# Patient Record
Sex: Female | Born: 1935
Health system: Southern US, Community
[De-identification: ages and names within clinical notes are randomized; demographics above are authoritative.]

## PROBLEM LIST (undated history)

## (undated) DIAGNOSIS — D649 Anemia, unspecified: Secondary | ICD-10-CM

## (undated) DIAGNOSIS — I10 Essential (primary) hypertension: Secondary | ICD-10-CM

## (undated) DIAGNOSIS — R011 Cardiac murmur, unspecified: Secondary | ICD-10-CM

## (undated) DIAGNOSIS — E785 Hyperlipidemia, unspecified: Secondary | ICD-10-CM

## (undated) DIAGNOSIS — K219 Gastro-esophageal reflux disease without esophagitis: Secondary | ICD-10-CM

## (undated) DIAGNOSIS — I890 Lymphedema, not elsewhere classified: Secondary | ICD-10-CM

## (undated) DIAGNOSIS — I499 Cardiac arrhythmia, unspecified: Secondary | ICD-10-CM

## (undated) DIAGNOSIS — I872 Venous insufficiency (chronic) (peripheral): Secondary | ICD-10-CM

## (undated) DIAGNOSIS — N189 Chronic kidney disease, unspecified: Secondary | ICD-10-CM

## (undated) DIAGNOSIS — L0390 Cellulitis, unspecified: Secondary | ICD-10-CM

## (undated) DIAGNOSIS — M1990 Unspecified osteoarthritis, unspecified site: Secondary | ICD-10-CM

## (undated) HISTORY — DX: Lymphedema, not elsewhere classified: I89.0

## (undated) HISTORY — DX: Hyperlipidemia, unspecified: E78.5

## (undated) HISTORY — PX: OTHER SURGICAL HISTORY: SHX169

## (undated) HISTORY — DX: Venous insufficiency (chronic) (peripheral): I87.2

## (undated) HISTORY — PX: JOINT REPLACEMENT: SHX530

## (undated) HISTORY — DX: Cellulitis, unspecified: L03.90

## (undated) HISTORY — PX: TONSILLECTOMY: SUR1361

---

## 2009-09-18 ENCOUNTER — Emergency Department (HOSPITAL_BASED_OUTPATIENT_CLINIC_OR_DEPARTMENT_OTHER): Admission: EM | Admit: 2009-09-18 | Discharge: 2009-09-18 | Payer: Self-pay | Admitting: Emergency Medicine

## 2009-09-19 ENCOUNTER — Ambulatory Visit (HOSPITAL_COMMUNITY)
Admission: RE | Admit: 2009-09-19 | Discharge: 2009-09-19 | Payer: Self-pay | Source: Home / Self Care | Admitting: Emergency Medicine

## 2010-04-05 ENCOUNTER — Emergency Department (HOSPITAL_BASED_OUTPATIENT_CLINIC_OR_DEPARTMENT_OTHER): Admission: EM | Admit: 2010-04-05 | Discharge: 2010-04-06 | Payer: Self-pay | Admitting: Emergency Medicine

## 2010-04-14 ENCOUNTER — Ambulatory Visit: Payer: Self-pay | Admitting: Vascular Surgery

## 2010-04-22 ENCOUNTER — Encounter: Payer: Self-pay | Admitting: Internal Medicine

## 2010-04-23 ENCOUNTER — Encounter: Admission: RE | Admit: 2010-04-23 | Discharge: 2010-04-23 | Payer: Self-pay | Admitting: Allergy and Immunology

## 2010-05-04 ENCOUNTER — Inpatient Hospital Stay (HOSPITAL_COMMUNITY): Admission: EM | Admit: 2010-05-04 | Discharge: 2010-05-10 | Payer: Self-pay | Admitting: Emergency Medicine

## 2010-05-05 ENCOUNTER — Encounter (INDEPENDENT_AMBULATORY_CARE_PROVIDER_SITE_OTHER): Payer: Self-pay | Admitting: Internal Medicine

## 2010-05-07 ENCOUNTER — Ambulatory Visit: Payer: Self-pay | Admitting: Vascular Surgery

## 2010-05-07 ENCOUNTER — Encounter (HOSPITAL_BASED_OUTPATIENT_CLINIC_OR_DEPARTMENT_OTHER): Payer: Self-pay | Admitting: Internal Medicine

## 2010-05-23 ENCOUNTER — Encounter: Payer: Self-pay | Admitting: Internal Medicine

## 2010-06-10 ENCOUNTER — Ambulatory Visit: Payer: Self-pay | Admitting: Physician Assistant

## 2010-06-10 DIAGNOSIS — Z860100 Personal history of colon polyps, unspecified: Secondary | ICD-10-CM | POA: Insufficient documentation

## 2010-06-10 DIAGNOSIS — J45909 Unspecified asthma, uncomplicated: Secondary | ICD-10-CM | POA: Insufficient documentation

## 2010-06-10 DIAGNOSIS — D509 Iron deficiency anemia, unspecified: Secondary | ICD-10-CM | POA: Insufficient documentation

## 2010-06-10 DIAGNOSIS — D649 Anemia, unspecified: Secondary | ICD-10-CM | POA: Insufficient documentation

## 2010-06-12 LAB — CONVERTED CEMR LAB
Basophils Absolute: 0.2 10*3/uL — ABNORMAL HIGH (ref 0.0–0.1)
Eosinophils Absolute: 1.8 10*3/uL — ABNORMAL HIGH (ref 0.0–0.7)
Hemoglobin: 10.4 g/dL — ABNORMAL LOW (ref 12.0–15.0)
Lymphocytes Relative: 27.6 % (ref 12.0–46.0)
MCHC: 31.8 g/dL (ref 30.0–36.0)
Neutro Abs: 4.1 10*3/uL (ref 1.4–7.7)
RDW: 17.5 % — ABNORMAL HIGH (ref 11.5–14.6)

## 2010-06-13 ENCOUNTER — Encounter
Admission: RE | Admit: 2010-06-13 | Discharge: 2010-06-13 | Payer: Self-pay | Source: Home / Self Care | Attending: Allergy and Immunology | Admitting: Allergy and Immunology

## 2010-06-13 ENCOUNTER — Encounter: Payer: Self-pay | Admitting: Internal Medicine

## 2010-06-13 ENCOUNTER — Encounter (INDEPENDENT_AMBULATORY_CARE_PROVIDER_SITE_OTHER): Payer: Self-pay | Admitting: *Deleted

## 2010-06-26 ENCOUNTER — Telehealth (INDEPENDENT_AMBULATORY_CARE_PROVIDER_SITE_OTHER): Payer: Self-pay | Admitting: *Deleted

## 2010-06-26 ENCOUNTER — Ambulatory Visit: Payer: Self-pay | Admitting: Internal Medicine

## 2010-06-26 DIAGNOSIS — R05 Cough: Secondary | ICD-10-CM

## 2010-07-11 ENCOUNTER — Ambulatory Visit: Admit: 2010-07-11 | Payer: Self-pay | Admitting: Internal Medicine

## 2010-07-24 ENCOUNTER — Encounter (INDEPENDENT_AMBULATORY_CARE_PROVIDER_SITE_OTHER): Payer: Self-pay | Admitting: *Deleted

## 2010-07-24 ENCOUNTER — Inpatient Hospital Stay (HOSPITAL_COMMUNITY)
Admission: EM | Admit: 2010-07-24 | Discharge: 2010-07-29 | Payer: Self-pay | Source: Home / Self Care | Attending: Internal Medicine | Admitting: Internal Medicine

## 2010-07-25 ENCOUNTER — Encounter (INDEPENDENT_AMBULATORY_CARE_PROVIDER_SITE_OTHER): Payer: Self-pay | Admitting: *Deleted

## 2010-07-26 ENCOUNTER — Encounter (INDEPENDENT_AMBULATORY_CARE_PROVIDER_SITE_OTHER): Payer: Self-pay | Admitting: *Deleted

## 2010-07-28 LAB — BASIC METABOLIC PANEL
BUN: 12 mg/dL (ref 6–23)
Chloride: 99 mEq/L (ref 96–112)
Chloride: 97 mEq/L (ref 96–112)
Creatinine, Ser: 0.98 mg/dL (ref 0.4–1.2)
GFR calc Af Amer: >60 mL/min (ref >60)
GFR calc non Af Amer: 53 mL/min — ABNORMAL LOW (ref >60)
Potassium: 4.4 mEq/L (ref 3.5–5.1)
Potassium: 4.2 mEq/L (ref 3.5–5.1)
Sodium: 130 mEq/L — ABNORMAL LOW (ref 135–145)

## 2010-07-28 LAB — CBC
MCH: 19.5 pg — ABNORMAL LOW (ref 26.0–34.0)
MCV: 62.1 fL — ABNORMAL LOW (ref 78.0–100.0)
Platelets: 188 10*3/uL (ref 150–400)
Platelets: 174 10*3/uL (ref 150–400)
RBC: 4.77 MIL/uL (ref 3.87–5.11)
RBC: 4.48 MIL/uL (ref 3.87–5.11)
WBC: 13.1 10*3/uL — ABNORMAL HIGH (ref 4.0–10.5)
WBC: 9.1 10*3/uL (ref 4.0–10.5)

## 2010-07-28 LAB — DIFFERENTIAL
Basophils Absolute: 0 10*3/uL (ref 0.0–0.1)
Eosinophils Absolute: 0.3 10*3/uL (ref 0.0–0.7)
Lymphocytes Relative: 9 % — ABNORMAL LOW (ref 12–46)
Neutrophils Relative %: 77 % (ref 43–77)

## 2010-07-28 LAB — IRON AND TIBC
Iron: 15 ug/dL — ABNORMAL LOW (ref 42–135)
Saturation Ratios: 6 % — ABNORMAL LOW (ref 20–55)
TIBC: 234 ug/dL — ABNORMAL LOW (ref 250–470)
UIBC: 219 ug/dL

## 2010-07-29 LAB — BASIC METABOLIC PANEL
Calcium: 8.8 mg/dL (ref 8.4–10.5)
Creatinine, Ser: 1.25 mg/dL — ABNORMAL HIGH (ref 0.4–1.2)
GFR calc Af Amer: 49 mL/min — ABNORMAL LOW (ref >60)
GFR calc non Af Amer: 40 mL/min — ABNORMAL LOW (ref >60)
Sodium: 131 mEq/L — ABNORMAL LOW (ref 135–145)

## 2010-07-29 LAB — CBC
Hemoglobin: 8.7 g/dL — ABNORMAL LOW (ref 12.0–15.0)
RBC: 4.44 MIL/uL (ref 3.87–5.11)
WBC: 7.1 10*3/uL (ref 4.0–10.5)

## 2010-07-30 ENCOUNTER — Ambulatory Visit: Admit: 2010-07-30 | Payer: Self-pay | Admitting: Internal Medicine

## 2010-07-30 LAB — CULTURE, BLOOD (ROUTINE X 2)
Culture  Setup Time: 201201192317
Culture  Setup Time: 201201192318
Culture: NO GROWTH

## 2010-08-05 NOTE — Assessment & Plan Note (Signed)
Summary: IRON DEF ANEMIA...AS.   History of Present Illness Visit Type: Initial Consult Primary GI MD: Erskine Emery MD [facility] Requesting Provider: Leanna Battles, MD Chief Complaint: Iron def anemia with black stools. Pt is taking a iron supplement. Pt states she did have a colonoscopy 4-5 years ago. Pt states she wants to discuss having another colonoscopy with her age and health issues. History of Present Illness:   Lori 77 YO [name], NEW TO GI TODAY, REFERRED BY DR. PATERSON FOR EVALUATION OF AN FE DEFICIENCY ANEMIA. SHE WAS FOUND TO BE ANEMIC WHILE HOSPITALIZED IN NOV.2011 WITH CELLULITIS IN HER LEG. SHE HAD HGB ELECTROPHORESIS WHICH SHOWED A 95%HGBA,AND 5%HGBA 2 .   HGB WAS 8.6,MCV62, FE 13,tibc 265, fe sat 5 %. SHE WAS STARTED ON NU IRON two times a day.HEMOSURE WAS NEGATIVE. SHE SAYS SHE HAS FAMILY HX OF "MEDITERRANEAN  ANEMIA" IN HER MOM, AND WAS TOLD SHE PROBABLY HAD IT IN THE PAST. SHE HAS HAD PRIOR COLONOSCOPIES PER DR. RHOTON IN [place], NO PRIROP EGD. WE HAVE OBTAINED SOME OF THOSE RECORDS [facility] HYPERPLASTIC POLYPS  IN 2002. WE HAVAN OFFICE NOTE FROM 2008 DISCUSSING FOLLOWUP BUT I DO NOT KNOW IF SHE HAD A COLONOSCOPY THE OR NOT.  SHE DENIES ANY ABDOMINAL PAIN, NO CHANGES IN BOWEL HABITS. STOOLS ARE DARK SINCE ON IRON, BUT NOT BEFORE. NO C/O HEART5BURN ,INDIGESTION ETC, NO DYSPHAGIA.    GI Review of Systems      Denies abdominal pain, acid reflux, belching, bloating, chest pain, dysphagia with liquids, dysphagia with solids, heartburn, loss of appetite, nausea, vomiting, vomiting blood, weight loss, and  weight gain.      Reports black tarry stools.     Denies anal fissure, change in bowel habit, constipation, diarrhea, diverticulosis, fecal incontinence, heme positive stool, hemorrhoids, irritable bowel syndrome, jaundice, light color stool, liver problems, rectal bleeding, and  rectal pain. Preventive Screening-Counseling & Management  Alcohol-Tobacco     Smoking  Status: quit      Drug Use:  no.      Current Medications (verified): 1)  Calcium 600 1500 Mg Tabs (Calcium Carbonate) .... One Tablet By Mouth Once Daily 2)  Coq10 100 Mg Caps (Coenzyme Q10) .... One Capsule By Mouth Once Daily 3)  Selenium 200 Mcg Tabs (Selenium) .... One Tablet By Mouth Once Daily 4)  Zinc 50 Mg Tabs (Zinc) .... One Tablet By Mouth Once Daily 5)  Magnesium 300 Mg Caps (Magnesium) .... One Capsule By Mouth Once Daily 6)  Vitamin E 1000 Unit Caps (Vitamin E) .... One Capsule By Mouth Once Daily 7)  Msm 1000 Mg Caps (Methylsulfonylmethane) .... One Capsule By Mouth Once Daily 8)  Folic Acid Q000111Q Mcg Tabs (Folic Acid) .... One Tablet By Mouth Once Daily 9)  Fish Oil 1200 Mg Caps (Omega-3 Fatty Acids) .... One Capsule By Mouth Once Daily 10)  Icaps  Caps (Multiple Vitamins-Minerals) .... One Capsule By Mouth Two Times A Day 11)  Nu-Iron 150 Mg Caps (Polysaccharide Iron Complex) .... One Capsule By Mouth Two Times A Day 12)  Align  Caps (Probiotic Product) .... One Capsule By Mouth Once Daily 13)  Symbicort 160-4.5 Mcg/act Aero (Budesonide-Formoterol Fumarate) .... 2 Puffs Two Times A Day 14)  Omeprazole 40 Mg Cpdr (Omeprazole) .... One Capsule By Mouth Two Times A Day 15)  Nasonex 50 Mcg/act Susp (Mometasone Furoate) .... One Spray Two Times A Day 16)  Zyrtec Allergy 10 Mg Tabs (Cetirizine Hcl) .... One Tablet By Mouth Once Daily  As Needed 17)  Albuterol Sulfate (2.5 Mg/44ml) 0.083% Nebu (Albuterol Sulfate) .... 4-6 Hour As Needed  Allergies (verified): No Known Drug Allergies  Past History:  Past Medical History: Iron defiency anemia Asthma Hx colon polyps Allergies  Past Surgical History: Unremarkable COLONOSCOPY 2002 DR. RHOTON  HYPERPLASTIC POLYPS  Family History: Unknown  Social History: Retired Widowed Patient is a former smoker.  Alcohol Use - no Daily Caffeine Use Illicit Drug Use - no Smoking Status:  quit Drug Use:  no  Review of Systems        The patient complains of allergy/sinus, anemia, arthritis/joint pain, heart murmur, itching, swelling of feet/legs, and urine leakage.  The patient denies anxiety-new, back pain, blood in urine, breast changes/lumps, change in vision, confusion, cough, coughing up blood, depression-new, fainting, fatigue, fever, headaches-new, hearing problems, heart rhythm changes, menstrual pain, muscle pains/cramps, night sweats, nosebleeds, pregnancy symptoms, shortness of breath, skin rash, sleeping problems, sore throat, swollen lymph glands, thirst - excessive , urination - excessive , urination changes/pain, vision changes, and voice change.         SEE HPI  Vital Signs:  Patient profile:   77 year old [name] Height:      65 inches Weight:      190.13 pounds BMI:     31.75 Pulse rate:   78 / minute Pulse rhythm:   regular BP sitting:   148 / 64  (right arm) Cuff size:   regular  Vitals Entered By: Marlon Pel CMA Lori [name]) (June 10, 2010 11:09 AM)  Physical Exam  General:  Well developed, well nourished, no acute distress.,APPEARS YOUNGER THAN HER STATED AGE Head:  Normocephalic and atraumatic. Eyes:  PERRLA, no icterus. Neck:  Supple; no masses or thyromegaly. Lungs:  Clear throughout to auscultation. Heart:  Regular rate and rhythm; SOFT, SYSTOLIC MURMUR Abdomen:  SOFT, NONTENDER, NO MASS OR HSM BS+ Rectal:  SCANT STOOL IN VAULT  HEME NEGATIVE  Extremities:  No clubbing, cyanosis, edema or deformities noted. Neurologic:  Alert and  oriented x4;  grossly normal neurologically. Psych:  Alert and cooperative. Normal mood and affect.   Impression & Recommendations:  Problem # 1:  ANEMIA, IRON DEFICIENCY (ICD-280.9) Assessment New 77 YO [name] IN GENERALLY GOOD HEALTH  WITH RECENT FINDING OF A COMBINED ANEMIA-WITH THALASSEMIA, AND IRON DEFICIENCY. HEME NEGATIVE ON EXAM TODAY AND BY HEMOSURE TESTING. NOT CLEAR THAT SHE HAS A CHRONIC GI BLOOD LOSS.  DISCUSSED OPTIONS OF COLONOSCOPY  AND UPPER ENDOSCOPY,VS. CONTINUING IRON REPLACEMENT FOR 3 MONTHS THEN REPEATING IRON STUDIES, BEFORE DECIDING TO PURSUE PROCEDURES. SHE FEVORE THIS APPROACH WHICH  IS REASONABLE. SHE ALSO SAYS SHE WOULDN'T HAVE SURGERY EVEN IF WE FOUND SOMETHING.  WILL CALL DR. Dinah Beers OFFICE AND BE SURE 2002 WAS HER MOST RECENT PROCEDURE, AND PT. THOUGHT IT WAS 4-5 YEARS AGO. CHECK CBC TODAY FOLLOW UP DR. PERRY IN 6 WEEKS, REPEAT IRON STUDIES. Orders: TLB-CBC Platelet - w/Differential ([id number]) ADDENDUM-CONTACTED DR. Dinah Beers OFFICE,CHART REVIEWED AND SHE HAS NOT HAD ANY PROCEDURE THERE SINCE 2002.  Problem # 2:  PERSONAL HX COLONIC POLYPS (ICD-V12.72) Assessment: Comment Only HYPERPLASTIC 2002  Problem # 3:  ASTHMA UNSPECIFIED (ICD-493.90) Assessment: Comment Only  Patient Instructions: 1)  Please go to lab, basement level. 2)  We will call you with the results. 3)  We made you a follow up appointment with Dr. Henrene Pastor for 07-30-2009. 4)  Appointment card given. 5)  We cancelled the apptointment with Dr. Henrene Pastor for 07-11-2009.  6)  Copy sent to :  Dr. Leanna Battles 7)  The medication list was reviewed and reconciled.  All changed / newly prescribed medications were explained.  A complete medication list was provided to the patient / caregiver.

## 2010-08-05 NOTE — Letter (Signed)
Summary: New Patient letter  [facility]  [address] [place], [place] [address]   Phone: [phone]  Fax: [phone]       05/23/2010 MRN: [id number]  [facility] [address] [place], [place]  [address]  Dear Lori [name],  Welcome to the Gastroenterology Division at [facility].    You are scheduled to see Dr.  Henrene Pastor on 07-11-10 at  11am on the 3rd floor at [facility], [place] [employer].  We ask that you try to arrive at our office 15 minutes prior to your appointment time to allow for check-in.  We would like you to complete the enclosed self-administered evaluation form prior to your visit and bring it with you on the day of your appointment.  We will review it with you.  Also, please bring a complete list of all your medications or, if you prefer, bring the medication bottles and we will list them.  Please bring your insurance card so that we may make a copy of it.  If your insurance requires a referral to see a specialist, please bring your referral form from your primary care physician.  Co-payments are due at the time of your visit and may be paid by cash, check or credit card.     Your office visit will consist of a consult with your physician (includes a physical exam), any laboratory testing he/she may order, scheduling of any necessary diagnostic testing (e.g. x-ray, ultrasound, CT-scan), and scheduling of a procedure (e.g. Endoscopy, Colonoscopy) if required.  Please allow enough time on your schedule to allow for any/all of these possibilities.    If you cannot keep your appointment, please call [phone] to cancel or reschedule prior to your appointment date.  This allows [place] the opportunity to schedule an appointment for another patient in need of care.  If you do not cancel or reschedule by 5 p.m. the business day prior to your appointment date, you will be charged a $50.00 late cancellation/no-show fee.    Thank you for choosing [facility] for your medical needs.  We appreciate the opportunity to care for you.  Please visit [place] at our website  to learn more about our practice.                     Sincerely,                                                             The Gastroenterology Division

## 2010-08-07 NOTE — Assessment & Plan Note (Signed)
Summary: Pulmonary/ new pt eval for uacs   Visit Type:  Initial Consult Copy to:  Dr. Neldon Mc Primary Provider/Referring Provider:  Dr. Bevelyn Buckles  CC:  Cough.  History of Present Illness: 77 yowf minimal smoker quit age 77 but lifelong pnds allergic dust and mold only per Dr Neldon Mc who referred her to Pulmonary clinic for cough responsive to even low doses of prednisone   June 26, 2010  1st pulmonary office eval of worsening pnds since 1995 esp since 2000 when husband diesd,  and last bad cough was October 2011  better with prednisone but still bothererd by PNDS seems worse after goes to bathroom in am, sleeps in recliner x 2 years due to concerns of excess mucus but does not disturb sleep. mucus is white assoc with nasal congestion no better on allegra or nasal steroids   Pt denies any significant sore throat, dysphagia, itching, sneezing,  nasal congestion or excess secretions,  fever, chills, sweats, unintended wt loss, pleuritic or exertional cp, hempoptysis, change in activity tolerance  orthopnea pnd or leg swelling Pt also denies any obvious fluctuation in symptoms with weather or environmental change or other alleviating or aggravating factors.       Current Medications (verified): 1)  Calcium 600 1500 Mg Tabs (Calcium Carbonate) .... One Tablet By Mouth Once Daily 2)  Coq10 100 Mg Caps (Coenzyme Q10) .... One Capsule By Mouth Once Daily 3)  Selenium 200 Mcg Tabs (Selenium) .... One Tablet By Mouth Once Daily 4)  Zinc 50 Mg Tabs (Zinc) .... One Tablet By Mouth Once Daily 5)  Magnesium 300 Mg Caps (Magnesium) .... One Capsule By Mouth Once Daily 6)  Vitamin E 1000 Unit Caps (Vitamin E) .... One Capsule By Mouth Once Daily 7)  Msm 1000 Mg Caps (Methylsulfonylmethane) .... One Capsule By Mouth Once Daily 8)  Folic Acid Q000111Q Mcg Tabs (Folic Acid) .... One Tablet By Mouth Once Daily 9)  Fish Oil 1200 Mg Caps (Omega-3 Fatty Acids) .... One Capsule By Mouth Once Daily 10)  Icaps   Caps (Multiple Vitamins-Minerals) .... One Capsule By Mouth Two Times A Day 11)  Nu-Iron 150 Mg Caps (Polysaccharide Iron Complex) .... One Capsule By Mouth Two Times A Day 12)  Align  Caps (Probiotic Product) .... One Capsule By Mouth Once Daily 13)  Symbicort 160-4.5 Mcg/act Aero (Budesonide-Formoterol Fumarate) .... 2 Puffs Two Times A Day 14)  Omeprazole 40 Mg Cpdr (Omeprazole) .... One Capsule By Mouth Two Times A Day-Ran Out 15)  Nasonex 50 Mcg/act Susp (Mometasone Furoate) .... One Spray Two Times A Day 16)  Allegra Allergy 60 Mg Tabs (Fexofenadine Hcl) .Marland Kitchen.. 1 Once Daily As Needed 17)  Albuterol Sulfate (2.5 Mg/50ml) 0.083% Nebu (Albuterol Sulfate) .... 4-6 Hour As Needed 18)  Selenium 200 Mcg Tabs (Selenium) .Marland Kitchen.. 1 Once Daily  Allergies (verified): No Known Drug Allergies  Past History:  Past Medical History: Iron defiency anemia Asthma Hx colon polyps Allergies Chronic cough/ uacs      - singulair failed       - Trial of 1st gen H1 June 26, 2010   Past Surgical History: Unremarkable COLONOSCOPY 2002 DR. RHOTON  HYPERPLASTIC POLYPS Left knee replacement 2005 Rt hip replacement 2006  Family History: Lung CA- Sister (was a smoker) Mother (never was a smoker)  Social History: Retired Network engineer Widowed Patient is a former smoker. Does not recall when quit or how long she smoked- "maybe 10 yrs" Alcohol Use - no Daily Caffeine Use Illicit  Drug Use - no  Review of Systems       The patient complains of productive cough, nasal congestion/difficulty breathing through nose, and itching.  The patient denies shortness of breath with activity, shortness of breath at rest, non-productive cough, coughing up blood, chest pain, irregular heartbeats, acid heartburn, indigestion, loss of appetite, weight change, abdominal pain, difficulty swallowing, sore throat, tooth/dental problems, headaches, sneezing, ear ache, anxiety, depression, hand/feet swelling, joint stiffness or pain,  rash, change in color of mucus, and fever.    Vital Signs:  Patient profile:   77 year old female Height:      65 inches Weight:      194 pounds O2 Sat:      96 % on Room air Temp:     98.4 degrees F oral Pulse rate:   89 / minute BP sitting:   158 / 90  (left arm)  Vitals Entered By: Tilden Dome (June 26, 2010 10:08 AM)  O2 Flow:  Room air  Physical Exam  Additional Exam:  wt 190 > 194 June 26, 2010  pleasant amb wf with mild nasal tone visi HEENT: nl dentition, turbinates, and orophanx. Nl external ear canals without cough reflex NECK :  without JVD/Nodes/TM/ nl carotid upstrokes bilaterally LUNGS: no acc muscle use, clear to A and P bilaterally without cough on insp or exp maneuvers CV:  RRR  no s3 or murmur or increase in P2, no edema  ABD:  soft and nontender with nl excursion in the supine position. No bruits or organomegaly, bowel sounds nl MS:  warm without deformities, calf tenderness, cyanosis or clubbing SKIN: warm and dry without lesions   NEURO:  alert, approp, no deficits     Impression & Recommendations:  Problem # 1:  COUGH (ICD-786.2)  Classic  Classic Upper airway cough syndrome, so named because it's frequently impossible to sort out how much is  CR/sinusitis with freq throat clearing (which can be related to primary GERD)   vs  causing  secondary extra esophageal GERD from wide swings in gastric pressure that occur with throat clearing, promoting self use of mint and menthol lozenges that reduce the lower esophageal sphincter tone and exacerbate the problem further.  These are the same pts who not infrequently have failed to tolerate ace inhibitors,  dry powder inhalers or biphosphonates or report having reflux symptoms that don't respond to standard doses of PPI  Add 1st gen H1 as per guidelines and intensify rx of diet reviewed.  discussed: The standardized cough guidelines recently published in Chest are a 14 step process, not a single office  visit,  and are intended  to address this problem logically,  with an alogrithm dependent on response to each progressive step  to determine a specific diagnosis with  minimal addtional testing needed. Therefore if compliance is an issue this empiric standardized approach simply won't work.   Orders: New Patient Level V [id number])  Medications Added to Medication List This Visit: 1)  Omeprazole 40 Mg Cpdr (Omeprazole) .... One capsule by mouth two times a day-ran out 2)  Omeprazole 40 Mg Cpdr (Omeprazole) .... Take one 30-60 min before first and last meals of the day 3)  Allegra Allergy 60 Mg Tabs (Fexofenadine hcl) .Marland Kitchen.. 1 once daily as needed 4)  Selenium 200 Mcg Tabs (Selenium) .Marland Kitchen.. 1 once daily 5)  Pepcid 20 Mg Tabs (Famotidine) .... Take one by mouth at bedtime 6)  Chlor-trimeton 4 Mg Tabs (Chlorpheniramine maleate) .... One at  bedtime and every 6 hours during the day if notice drainage  Patient Instructions: 1)  Stop all oil based vitamins 2)  stop allegra 3)  Start chlortrimeton 4mg  one at bedtime automatica and then every 6 hours as needed during the day 4)  Add Pepcid 20 mg one at bedtime 5)  GERD (REFLUX)  is a common cause of respiratory symptoms. It commonly presents without heartburn and can be treated with medication, but also with lifestyle changes including avoidance of late meals, excessive alcohol, smoking cessation, and avoid fatty foods, chocolate, peppermint, colas, red wine, and acidic juices such as orange juice. NO MINT OR MENTHOL PRODUCTS SO NO COUGH DROPS  6)  USE SUGARLESS CANDY INSTEAD (jolley ranchers)  7)  NO OIL BASED VITAMINS  8)  Please schedule a follow-up appointment in 6 weeks, sooner if needed  Prescriptions: OMEPRAZOLE 40 MG CPDR (OMEPRAZOLE) Take one 30-60 min before first and last meals of the day  #60 x 11   Entered and Authorized by:   Tanda Rockers MD   Signed by:   Tanda Rockers MD on 06/26/2010   Method used:   Electronically to        [id number] (retail)       [address]       [place], [place]  [address]       Ph: [phone] or [phone]       Fax: [phone]   RxID:   [id number]   Appended Document: Pulmonary/ new pt eval for uacs copy to Drs Sharlett Iles and Neldon Mc

## 2010-08-07 NOTE — Discharge Summary (Signed)
Summary: Hospital Discharge Summary  Lori [name] MRN: [id number] Acct#: [id number] PHYSICIAN DOCUMENTATION SHEET Thu Jan 19 23:47:51 EST 2012 Tillie Rung. [facility] [address] [place], [place] [address] PHONE: [phone] MRN: [id number] Account #: [id number] Name: Lori [name], Lori [name] Sex: F Age: 77 DOB: 01-24-1919 Complaint: Rash Primary Diagnosis: Cellulitis of left lower leg Arrival Time: 07/24/2010 13:51 Discharge Time: 07/24/2010 20:22 All Providers: Dr. Blanchie [name] - MD; Mr Lori [name] - PA PROVIDER: Dr. Blanchie [name] - MD HPI: The patient is a 77 year old female who presents with a chief complaint of rash. The history was provided by the patient. The patient was seen at 03:27 PM. The rash started yesterday. The onset was gradual. The Pattern is persistent. The Course is worsening. The complaint is described as a(n) pain and rash. The rash is located in the right lower extremity. The rash is described as erythematous and macular. The symptoms are described as moderate. The condition is aggravated by palpation and walking. The condition is relieved by nothing. The symptoms have been associated with chills and fever, fever, itching and nausea, while the symptoms have not been associated with diarrhea, difficulty breathing, dysuria or headache. The patient has a significant history of similar symptoms previously, while there is no significant history of serious medical conditions. 15:28 07/24/2010 by Lori [name] - MD, Dr. Leonard [name]: Statement: all systems negative except as marked or noted in the HPI 15:31 07/24/2010 by Lori [name] - MD, Dr. Carmon [name]: Documentation: physician reviewed/amended Historian: patient Patient's Current Physicians Patient's Current Physicians (please list PCP first) Lori [name] - IM, Lori [name] NOTE - DR Lori [name] Past medical history: acid reflux, allergies, hypercholesterolemia Surgical History: knee surgery,  tonsillectomy Social History: non-smoker, non-drinker, no drug abuse Allergies Drug Reaction Allergy Note NKDA 15:31 07/24/2010 by Lori [name] - MD, Dr. Fransico [name] Lori [name], Lori [name] - MRN: [id number] Acct#: [id number] Home Medications: Documentation: physician reviewed/amended Medications Medication [Medication] Dosage Frequency Last Dose Unable I-Caps Areds Oral Nu-Iron Oral omeprazole Oral 15:31 07/24/2010 by Lori [name] - MD, Dr. Physical examination: Vital signs and O2 SAT: reviewed, normal except, febrile, tachycardic Constitutional: well developed, well nourished, well hydrated, in no acute distress Head and Face: normocephalic Eyes: normal appearance, EOMI, PERRL ENMT: mouth and pharynx normal Neck: supple Cardiovascular: no murmur, rub, or gallop, tachycardia Respiratory: normal, breath sounds clear & equal bilaterally, no rales, rhonchi, wheezes, or rub Chest: nontender Abdomen: soft, nontender, no guarding, no rebound tenderness Extremities: normal except, see skin exam, neurovascularly intact, pulses normal Neuro: AA&Ox3, gait normal, motor intact in all extremities, sensation normal Skin: color normal, no petechiae NOTE - dark erythema of the left distal dorsal foot and mid lower leg tha is warm and macular. no pustules, fluctuance or induration. 15:32 07/24/2010 by Lori [name] - MD, Dr. ED Course: Comments: pt with recurrent cellulitis and chronic lymphedema who developed sx of cellulitis again last night with fever and systemic symptoms today. Pt is well appearing but febrile here and areas of cellulitis on the left lower leg. CBC, BMP, UA and blood cx pending. Will give benadryl, tylenol and start clinda. 15:34 07/24/2010 by Lori [name] - MD, Dr. ED Course: Comments: sent to CDU to await labs. 15:35 07/24/2010 by Lori [name] - MD, Dr. Attending: Supervision of: Midlevel: See CDU documentation 15:34 07/24/2010 by Lori [name] - MD,  Dr. Hyman [name] electronically signed by ER Physician 23:47 07/24/2010 by Lori [name] - MD, Dr. Kingsley [name] Lori [name], Lori [name] - MRN: [id number] Acct#: [id number] PROVIDER: Mr Lori [name] -  PA ED Course: Comments: Pt discussed with Lori [name]. Pt with left lower extremity cellulitis and fever. Pt has had hx of same in past. Labs pending. Will plan for admission. 16:52 07/24/2010 by Lori [name] - PA, Mr ED Course: Comments: Spoke with Dr. Sharlett [name]. He will come see pt and admit. He has given holding orders to nurse and bed request. 17:03 07/24/2010 by Lori [name] - PA, Mr ED Course: Comments: Pt remained stable while in CDU. 21:18 07/24/2010 by Lori [name] - PA, Mr Reviewed result: Result Type: Lori [name]: [id number] Step Type: LAB Procedure Name: BASIC METABOLIC PANEL Procedure: BASIC METABOLIC PANEL Procedure Notes: GFR, Est Afr Am - The eGFR has been calculated using the MDRD equation. This calculation has not been validated in all clinical situations. eGFR's persistently <60 mL/min signify possible Chronic Kidney Disease. Result: SODIUM 130 mEq/L [135-145] L POTASSIUM 4.4 mEq/L [3.5-5.1] CHLORIDE 99 mEq/L [96-112] CARBON DIOXIDE 25 mEq/L [19-32] GLUCOSE 120 mg/dL [70-99] H BUN 15 mg/dL [6-23] CREATININE 0.98 mg/dL [0.4-1.2] CALCIUM 9.0 mg/dL [8.4-10.5] GFR, Est Non Af Am 53 mL/min [>60] L GFR, Est Afr Am >60 mL/min [>60] 16:50 07/24/2010 by Lori [name] - PA, Mr Reviewed result: Result Type: Lori [name]: [id number] Step Type: LAB Procedure Name: CBC WITH DIFF Procedure: CBC WITH DIFF Procedure Notes: RBC COUNT - QA FLAGS AND/OR RANGES MODIFIED BY DEMOGRAPHIC UPDATE ON 01/19 AT 1615 HEMOGLOBIN - QA FLAGS AND/OR RANGES MODIFIED BY DEMOGRAPHIC UPDATE ON 01/19 AT 3 Lori [name], Lori [name] MRN: [id number] Acct#: [id number] 1615 HEMATOCRIT - QA FLAGS AND/OR RANGES MODIFIED BY DEMOGRAPHIC UPDATE ON 01/19 AT 1615 Result: WBC COUNT 13.1 K/uL [4.0-10.5] H RBC COUNT 4.77  MIL/uL [3.87-5.11] HEMOGLOBIN 9.3 g/dL [12.0-15.0] L HEMATOCRIT 30.2 % [36.0-46.0] L MCV 63.3 fL [78.0-100.0] L MCH 19.5 pg [26.0-34.0] L MCHC 30.8 g/dL [30.0-36.0] RDW 15.3 % [11.5-15.5] PLATELET COUNT 188 K/uL [150-400] NEUTROPHIL 77 % [43-77] LYMPHOCYTE 9 % [12-46] L MONOCYTE 12 % [3-12] EOSINOPHIL 2 % [0-5] BASOPHIL 0 % [0-1] ABS GRANULOCYTE 10.0 K/uL [1.7-7.7] H ABS LYMPH 1.2 K/uL [0.7-4.0] ABS MONOCYTE 1.6 K/uL [0.1-1.0] H ABS EOS 0.3 K/uL [0.0-0.7] ABS BASO 0.0 K/uL [0.0-0.1] RBC MORPHOLOGY TARGET CELLS 16:50 07/24/2010 by Lori [name] - PA, Mr Patient disposition: Patient disposition: Admit - Inpatient Bed Primary Diagnosis: cellulitis of left lower leg 17:03 07/24/2010 by Lori [name] - PA, Mr Documentation completed by Mid-level Provider 21:18 07/24/2010 by Lori [name] - PA, Mr 4

## 2010-08-07 NOTE — Letter (Signed)
Summary: [facility]   Imported By: Phillis Knack 07/04/2010 09:52:24  _____________________________________________________________________  External Attachment:    Type:   Image     Comment:   External Document

## 2010-08-07 NOTE — Progress Notes (Signed)
Summary: Omeprazole PA-APPROVED 06-26-10 THROUGH 06-26-11  Phone Note Outgoing Call   Call placed by: Clayborne Dana CMA,  June 26, 2010 5:00 PM Call placed to: PA dept [phone] Summary of Call: [facility] faxed PA request for Omeprazole 40mg ; Spoke with PA Dept and its approved from 06-26-2010 through 06-26-2011. Pharmacy is aware. Left message for patient at given number about this. Initial call taken by: Clayborne Dana CMA,  June 26, 2010 5:02 PM

## 2010-08-11 ENCOUNTER — Encounter (HOSPITAL_COMMUNITY): Payer: Self-pay | Admitting: Radiology

## 2010-08-11 ENCOUNTER — Emergency Department (HOSPITAL_COMMUNITY): Payer: Medicare Other

## 2010-08-11 ENCOUNTER — Encounter (INDEPENDENT_AMBULATORY_CARE_PROVIDER_SITE_OTHER): Payer: Self-pay | Admitting: *Deleted

## 2010-08-11 ENCOUNTER — Emergency Department (HOSPITAL_COMMUNITY)
Admission: EM | Admit: 2010-08-11 | Discharge: 2010-08-11 | Disposition: A | Discharge status: Home / Self Care | Payer: Medicare Other | Attending: Emergency Medicine | Admitting: Emergency Medicine

## 2010-08-11 DIAGNOSIS — I491 Atrial premature depolarization: Secondary | ICD-10-CM | POA: Insufficient documentation

## 2010-08-11 DIAGNOSIS — J45909 Unspecified asthma, uncomplicated: Secondary | ICD-10-CM | POA: Insufficient documentation

## 2010-08-11 DIAGNOSIS — J3489 Other specified disorders of nose and nasal sinuses: Secondary | ICD-10-CM | POA: Insufficient documentation

## 2010-08-11 DIAGNOSIS — D721 Eosinophilia, unspecified: Secondary | ICD-10-CM | POA: Insufficient documentation

## 2010-08-11 DIAGNOSIS — I4949 Other premature depolarization: Secondary | ICD-10-CM | POA: Insufficient documentation

## 2010-08-11 DIAGNOSIS — R0602 Shortness of breath: Secondary | ICD-10-CM | POA: Insufficient documentation

## 2010-08-11 LAB — CBC
HCT: 30 % — ABNORMAL LOW (ref 36.0–46.0)
Hemoglobin: 9.5 g/dL — ABNORMAL LOW (ref 12.0–15.0)
MCH: 19.6 pg — ABNORMAL LOW (ref 26.0–34.0)
MCHC: 31.7 g/dL (ref 30.0–36.0)
MCV: 61.9 fL — ABNORMAL LOW (ref 78.0–100.0)

## 2010-08-11 LAB — BASIC METABOLIC PANEL
BUN: 22 mg/dL (ref 6–23)
Calcium: 9.5 mg/dL (ref 8.4–10.5)
GFR calc non Af Amer: 41 mL/min — ABNORMAL LOW (ref >60)
Glucose, Bld: 126 mg/dL — ABNORMAL HIGH (ref 70–99)
Sodium: 130 mEq/L — ABNORMAL LOW (ref 135–145)

## 2010-08-11 LAB — CK TOTAL AND CKMB (NOT AT ARMC): CK, MB: 1.4 ng/mL (ref 0.3–4.0)

## 2010-08-11 LAB — DIFFERENTIAL
Lymphocytes Relative: 23 % (ref 12–46)
Monocytes Absolute: 1.2 10*3/uL — ABNORMAL HIGH (ref 0.1–1.0)
Monocytes Relative: 14 % — ABNORMAL HIGH (ref 3–12)
Neutro Abs: 3.4 10*3/uL (ref 1.7–7.7)

## 2010-08-11 LAB — URINALYSIS, ROUTINE W REFLEX MICROSCOPIC
Ketones, ur: NEGATIVE mg/dL
Nitrite: NEGATIVE
Protein, ur: NEGATIVE mg/dL

## 2010-08-11 MED ORDER — IOHEXOL 300 MG/ML  SOLN: 100.0000 mL | Freq: Once | INTRAMUSCULAR | Status: DC | PRN

## 2010-08-13 NOTE — H&P (Signed)
Lori [name], Lori [name]               ACCOUNT NO.:  [id number]  MEDICAL RECORD NO.:  [id number]          PATIENT TYPE:  INP  LOCATION:  [id number]                         FACILITY:  [facility]  PHYSICIAN:  Ermalene Searing. Philip Aspen, M.D.DATE OF BIRTH:  [birth year]  DATE OF ADMISSION:  07/24/2010 DATE OF DISCHARGE:                             HISTORY & PHYSICAL   CHIEF COMPLAINT:  Left leg rash and fever.  HISTORY OF PRESENT ILLNESS:  The patient is an 77 year old white woman who is well-known to me.  She has had multiple episodes of leg cellulitis with hospital admissions and IV antibiotics.  She has been seen by a vascular surgeon who felt that she did not have venous insufficiency amenable to surgical treatments, and she has also been seen by a lymphedema expert in [place] with recommendation for use of home air compression hose which she states she has been compliant with.  Over the past few days, she has had progressive symptoms ofbilateral leg edema and erythema of the left foot and distal leg.  Today she had fever and chills.  We had recommended starting an antibiotic today but she declined p.o. treatment and presented to the emergency room.  Currently, she complains of slight worsening of her chronic rhinitis.  She continues to have a mild cough and she has slight tenderness and itching of the distal left leg and foot.  PAST MEDICAL HISTORY: 1. Chronic venous insufficiency. 2. Multiple episodes of leg cellulitis and stasis dermatitis (left     greater than right), hypertension, hyperlipidemia, bilateral     sensorineural hearing loss for which she uses hearing aids in both     ears, bilateral cerumen impaction, recent diagnosis of thalassemia     minor trait and iron deficiency anemia for which she was seen by     Dr. [facility] in December 2011 and declined definitive workup     with colonoscopy and endoscopy.  She also has chronic cough for     which she was seen by Dr. Melvyn Novas in  December 2011 who had initiated     protocol treatment to try eradicate her cough.  He also obtained a     CT scan of the chest in December 2011 with results showing     increased bronchopulmonary markings from chronic asthma or     bronchitis, old granulomatous disease, tracheal diverticulum,     coronary artery disease, and no signs of neoplasm.  She has a     history of hyperlipidemia, left total knee replacement, and right     total hip replacement.  ALLERGIES:  No known drug allergies.  MEDICATIONS AT THE TIME OF ADMISSION: 1. Vitamin C 500 mg p.o. twice daily. 2. Nu-Iron 150 one tablet p.o. twice daily. 3. ICaps 1 tablet twice daily. 4. Debrox 4 drops into both ear canals once daily p.r.n. ear wax     filled up. 5. Symbicort 160/4.5, 2 inhalations twice daily. 6. Omeprazole 40 mg twice daily. 7. Nasonex 1 spray each nostril twice daily. 8. Maxzide 25, 1 tablet p.o. every Monday, Wednesday, and Friday. 9. Keflex 500 mg p.o. t.i.d. had been  prescribed but not started.  PHYSICIANS INVOLVED IN CARE:  Dr. Derald Macleod (Orthopedics), Christinia Gully (Pulmonary), [facility] (GI).  SOCIAL HISTORY:  She has been a widow since her husband died on July 13, [id number] from complications of diabetes.  She has 1 son, Elliona Duffer, age 60, who lives in [place].  She has 2 grandchildren in [place]. Her friend, and point of contact, is Danielle Rankin, at telephone numbers [phone] and [phone] (cell).  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  FAMILY HISTORY:  Her father died in his [id number] of uncertain cause.  Her mother died at age 26 from lung cancer.  She is the only remaining alive sibling.  There is no family history of breast or colon cancer or diabetes mellitus.  REVIEW OF SYSTEMS:  She has had fever that started today with some chills.  She has not had headache, stiff neck, change in vision, shortness of breath, cough, chest pain or  palpitations, nausea, vomiting, diarrhea, constipation, dysuria, or frequency.  She denies anxiety or depression.  CURRENT PHYSICAL EXAMINATION:  VITAL SIGNS:  Blood pressure 127/66, pulse 103, respirations 20, temperature 100.8 degrees Fahrenheit, pulse oxygen saturation 94% on room air. GENERAL:  She is an elderly white woman who is in no apparent distress while lying at 50-degree elevation from the head of bed. HEAD, EYES, EARS, NOSE AND THROAT:  Significant for moderate nasal congestion. NECK:  Supple and without jugular venous distention or carotid bruit. CHEST:  Clear to auscultation. HEART:  Had a regular rate and rhythm with a systolic ejection murmur of grade 2/6 in the left sternal border. ABDOMEN:  Had normal bowel sounds and no hepatosplenomegaly or tenderness. EXTREMITIES:  Had bilateral 2+ leg edema.  There was pitting.  There is erythema and warmth on the distal left leg and the distal third of the dorsum of the left foot. SKIN:  Significant for mild tinea pedis on the left.  INITIAL LABORATORY STUDIES:  White blood cell count 13.1, hemoglobin 9.3, hematocrit 30 with MCV 63, platelets 188,000.  Serum sodium 130, potassium 4.4, chloride 99, carbon dioxide 25, BUN 15, creatinine 0.98, glucose 120.  IMPRESSION AND PLAN: 1. Cellulitis of the left leg and foot.  This is recurrent and likely     originating from tinea pedis.  Most likely organism would be strap     variety and less likely to be from Staphylococcus.  We will start     her on an Ancef IV every 6 hours.  In addition, we will use air     compression hose and Lamisil. 2. Chronic venous insufficiency:  She has moderately severe venous     insufficiency.  She cannot apply the tight compression hose that     she needs and has the device at home which she will need to use     more frequently.  She has been evaluated by vascular surgeon and     felt not to be a candidate for surgical remedies. 3. Anemia:   Moderately severe and due to combined thalassemia trait     and iron deficiency.  We will recheck her iron levels and if low,     we will administer IV Feraheme.          ______________________________ Ermalene Searing. Philip Aspen, M.D.    DGP/MEDQ  D:  07/26/2010  T:  07/26/2010  Job:  [id number]  Electronically Signed by Leanna Battles M.D. on 08/13/2010 07:55:50 AM

## 2010-08-26 NOTE — Discharge Summary (Signed)
Lori [name], Lori [name]               ACCOUNT NO.:  [id number]  MEDICAL RECORD NO.:  [id number]          PATIENT TYPE:  INP  LOCATION:  [id number]                         FACILITY:  [facility]  PHYSICIAN:  Ermalene Searing. Philip Aspen, M.D.DATE OF BIRTH:  10-29-1918  DATE OF ADMISSION:  07/24/2010 DATE OF DISCHARGE:  07/29/2010                              DISCHARGE SUMMARY   PERTINENT FINDINGS:  The patient is an 77 year old white woman who is well known to me.  She has had multiple episodes of leg cellulitis with hospital admissions for IV antibiotics.  She has been seen by a vascular surgeon who felt that she did not have venous insufficiency that was amenable to surgical treatments, and she has also been seen by a lymphedema expert in [place] with a recommendation for use of home air compression hose.  She has been compliant with these treatments and nevertheless, over the past few days prior to admission, has noted progressive symptoms of bilateral leg edema and erythema of the left foot and distal leg.  On the day of admission, she had fever and chills. I had recommended a trial of p.o. antibiotics which she declined and presented to the emergency room.  She also complained of slight worsening of her chronic rhinitis and increase in her mild cough.  PAST MEDICAL HISTORY: 1. Chronic venous insufficiency. 2. Multiple episodes of leg stasis dermatitis. 3. Hypertension. 4. Hyperlipidemia. 5. Bilateral sensorineural hearing loss, for which, she uses hearing     aids in both ears. 6. Bilateral cerumen impactions. 7. Recent diagnosis of thalassemia minor trait and iron-deficiency     anemia, for which, she declined definitive workup by Dr. [facility]. 8. Chronic cough, for which, she had seen Dr. Melvyn Novas in December 2001,     and who had initiated protocol treatment to help eradicate her     cough. 9. December 2011, CT scan of the chest showing increased     bronchopulmonary markings from  chronic asthma or bronchitis and old     granulomatous disease. 10.Coronary artery disease. 11.Hyperlipidemia. 12.Status post left total knee replacement. 13.Status post right total hip replacement.  ALLERGIES:  No known drug allergies.  MEDICATIONS AT THIS TIME OF HOSPITAL ADMISSION: 1. Vitamin C 500 mg p.o. b.i.d. 2. Nu-Iron 150 one tablet p.o. twice daily. 3. ICaps 1 capsule p.o. twice daily. 4. Debrox 4 drops into both ear canals once daily p.r.n. ear wax     buildup. 5. Symbicort 160/4.5 two inhalations twice daily. 6. Omeprazole 40 mg p.o. twice daily. 7. Nasonex 1 spray each nostril twice daily. 8. Maxzide 25 one tablet p.o. every Monday, Wednesday, and Friday. 9. Keflex 500 mg p.o. t.i.d. had been prescribed, but not started.  See admission history and physical for further information to include physicians involved in care, social history, family history, and review of systems.  Her friend, point of contact, is Danielle Rankin, at telephone numbers [phone] and [phone].  INITIAL PHYSICAL EXAMINATION:  VITAL SIGNS:  Blood pressure 127/66, pulse 103, respirations 20, temperature 100.8 degrees Fahrenheit, pulse oxygen saturation 94% on room air. GENERAL:  She is an  elderly white woman who was in no apparent distress while lying at 53 elevation of the head of bed. HEAD, EYES, EARS, NOSE AND THROAT:  Significant for moderate nasal congestion. NECK:  Supple without jugular venous distention or carotid bruit. CHEST:  Clear to auscultation. HEART:  Regular rate and rhythm with a systolic ejection murmur of grade 2/6 at the left sternal border. ABDOMEN:  Normal bowel sounds and no hepatosplenomegaly or tenderness. EXTREMITIES:  Bilateral 2+ leg edema with some pitting.  There was erythema and warmth on the distal left leg and distal third of the dorsum of the left foot. SKIN:  Significant for mild tinea pedis on the left foot.  INITIAL LABORATORY STUDIES:  White blood cell  count 13.1, hemoglobin 9.3, hematocrit 30 with MCV 63, platelets 188.  Serum sodium 130, potassium 4.4, chloride 99, carbon dioxide 25, BUN 15, creatinine 0.98, glucose 120.  HOSPITAL COURSE:  The patient was admitted to a medical bed without telemetry.  She was initially started on Ancef 1 g IV every 6 hours with air compression hose and Lamisil cream between all toes.  However, she showed increased erythema and edema of the left leg and foot on the Ancef, so she was switched to vancomycin.  Unfortunately, she refused to wear the air compression hose as they felt somewhat uncomfortable to her despite a discussion of the merits of use of the hose.  She had labs drawn that showed serum iron 15 with TIBC 234 (6% saturation).  She was treated with one dose of Feraheme IV iron which she tolerated well. Gradually, her left leg cellulitis improved and she became afebrile. She was discharged home on p.o. clindamycin.  CONDITION ON DISCHARGE:  The left leg rash was entirely gone at the time of discharge and she had no shortness of breath or diarrhea and had been eating well.  In general, she is a well-nourished, elderly white woman who is in no apparent distress.  Chest is clear to auscultation.  Heart had a regular rate and rhythm with a systolic ejection murmur of grade 2/6 at left sternal border.  Abdomen was benign.  Extremities had bilateral 1+ leg edema with minimal erythema on the dorsum of the left foot.  Most recent laboratory studies included white blood cell count 7.1, hemoglobin 8.7, hematocrit 27, platelets 169.  Serum sodium 131, potassium 4.2, chloride 95, carbon dioxide 25, BUN 12, creatinine 1.25, glucose 150.  PROCEDURES:  None.  COMPLICATIONS:  None.  DISCHARGE DIAGNOSES: 1. Recurrent left leg and foot cellulitis. 2. Left foot tinea pedis which was felt to be the source of cellulitis     of the leg. 3. Moderately severe anemia from beta-thalassemia trait and iron      deficiency anemia. 4. Chronic renal insufficiency. 5. Chronic venous insufficiency. 6. Bilateral sensorineural hearing loss. 7. Chronic cough. 8. Hyperlipidemia. 9. Status post left total knee replacement. 10.Status post right total knee replacement. 11.Gastroesophageal reflux disease. 12.Severe asthma.  DISCHARGE MEDICATIONS: 1. Artificial tears 2 drops in both eyes t.i.d. 2. Symbicort 160/4.5 one inhalation twice daily. 3. Benadryl cream to itchy areas t.i.d. as needed. 4. Flora-Q 1 capsule p.o. daily for the next 30 days. 5. Loratadine 10 mg p.o. daily. 6. Lamisil 80 cream 1% apply thin layer between toes of both feet at     bedtime indefinitely to treat and prevent fungal infection and skin     breakdown. 7. Clindamycin 600 mg p.o. t.i.d. for the next 7 days. 8. Coenzyme-Q10 one tablet  p.o. daily. 9. Nasonex 1 spray each nostril twice daily. 10.Nu-Iron 150 one capsule p.o. twice daily. 11.Omeprazole 1 capsule p.o. twice daily. 12.Nasal saline spray daily as needed. 13.Maxzide 25 one tablet p.o. every other day.  DISPOSITION AND FOLLOWUP:  She was discharged to home in the company of her friend, Danielle Rankin.  She was advised to increase her activity slowly and to not add salt to foods.  She was advised to continue to apply the Lamisil cream between the toes of all feet twice daily.  She was advised to schedule a followup visit with Dr. Leanna Battles by calling [phone].  She noted that she already had a visit scheduled in February 2012, and could keep that scheduled visits as is.  She was advised to call us should her condition worsen at any time.          ______________________________ Ermalene Searing Philip Aspen, M.D.     DGP/MEDQ  D:  08/13/2010  T:  08/14/2010  Job:  [id number]  Electronically Signed by Leanna Battles M.D. on 08/26/2010 07:43:50 AM

## 2010-09-05 ENCOUNTER — Ambulatory Visit: Payer: Self-pay | Admitting: Internal Medicine

## 2010-09-16 LAB — CBC
HCT: 25.4 % — ABNORMAL LOW (ref 36.0–46.0)
HCT: 27.5 % — ABNORMAL LOW (ref 36.0–46.0)
Hemoglobin: 8.9 g/dL — ABNORMAL LOW (ref 12.0–15.0)
MCH: 19.4 pg — ABNORMAL LOW (ref 26.0–34.0)
MCH: 19.9 pg — ABNORMAL LOW (ref 26.0–34.0)
MCHC: 31.9 g/dL (ref 30.0–36.0)
MCHC: 32.3 g/dL (ref 30.0–36.0)
MCV: 60.8 fL — ABNORMAL LOW (ref 78.0–100.0)
MCV: 61.1 fL — ABNORMAL LOW (ref 78.0–100.0)
Platelets: 275 10*3/uL (ref 150–400)
RBC: 4.5 MIL/uL (ref 3.87–5.11)
RDW: 15.5 % (ref 11.5–15.5)
RDW: 15.6 % — ABNORMAL HIGH (ref 11.5–15.5)
WBC: 11.3 10*3/uL — ABNORMAL HIGH (ref 4.0–10.5)

## 2010-09-16 LAB — COMPREHENSIVE METABOLIC PANEL
AST: 30 U/L (ref 0–37)
Albumin: 3.3 g/dL — ABNORMAL LOW (ref 3.5–5.2)
BUN: 7 mg/dL (ref 6–23)
CO2: 28 mEq/L (ref 19–32)
Calcium: 9.2 mg/dL (ref 8.4–10.5)
Creatinine, Ser: 1.03 mg/dL (ref 0.4–1.2)
GFR calc Af Amer: >60 mL/min (ref >60)
GFR calc non Af Amer: 51 mL/min — ABNORMAL LOW (ref >60)
Total Bilirubin: 0.8 mg/dL (ref 0.3–1.2)

## 2010-09-16 LAB — URINE CULTURE
Colony Count: NO GROWTH
Culture  Setup Time: 201111040338
Culture: NO GROWTH
Special Requests: NEGATIVE

## 2010-09-16 LAB — URINALYSIS, ROUTINE W REFLEX MICROSCOPIC
Bilirubin Urine: NEGATIVE
Glucose, UA: NEGATIVE mg/dL
Hgb urine dipstick: NEGATIVE
Specific Gravity, Urine: 1.007 (ref 1.005–1.030)
pH: 5.5 (ref 5.0–8.0)

## 2010-09-16 LAB — BASIC METABOLIC PANEL
BUN: 11 mg/dL (ref 6–23)
BUN: 9 mg/dL (ref 6–23)
Calcium: 8.6 mg/dL (ref 8.4–10.5)
Chloride: 94 mEq/L — ABNORMAL LOW (ref 96–112)
GFR calc non Af Amer: 52 mL/min — ABNORMAL LOW (ref >60)
Glucose, Bld: 101 mg/dL — ABNORMAL HIGH (ref 70–99)
Potassium: 4.2 mEq/L (ref 3.5–5.1)
Potassium: 4.4 mEq/L (ref 3.5–5.1)
Sodium: 130 mEq/L — ABNORMAL LOW (ref 135–145)

## 2010-09-16 LAB — HEMOGLOBINOPATHY EVALUATION: Hgb F Quant: 0 % (ref 0.0–2.0)

## 2010-09-16 LAB — BRAIN NATRIURETIC PEPTIDE: Pro B Natriuretic peptide (BNP): 795 pg/mL — ABNORMAL HIGH (ref 0.0–100.0)

## 2010-09-17 LAB — RETICULOCYTES
RBC.: 4.81 MIL/uL (ref 3.87–5.11)
Retic Ct Pct: 1.1 % (ref 0.4–3.1)

## 2010-09-17 LAB — URINALYSIS, ROUTINE W REFLEX MICROSCOPIC
Bilirubin Urine: NEGATIVE
Ketones, ur: NEGATIVE mg/dL
Nitrite: NEGATIVE
Protein, ur: NEGATIVE mg/dL
Urobilinogen, UA: 0.2 mg/dL (ref 0.0–1.0)
pH: 6 (ref 5.0–8.0)

## 2010-09-17 LAB — CBC
Hemoglobin: 8.6 g/dL — ABNORMAL LOW (ref 12.0–15.0)
MCH: 20.1 pg — ABNORMAL LOW (ref 26.0–34.0)
MCH: 20.1 pg — ABNORMAL LOW (ref 26.0–34.0)
MCHC: 32.5 g/dL (ref 30.0–36.0)
MCV: 61.8 fL — ABNORMAL LOW (ref 78.0–100.0)
Platelets: 181 10*3/uL (ref 150–400)
Platelets: 197 10*3/uL (ref 150–400)
RBC: 4.27 MIL/uL (ref 3.87–5.11)
RDW: 15.7 % — ABNORMAL HIGH (ref 11.5–15.5)
WBC: 6 10*3/uL (ref 4.0–10.5)
WBC: 9 10*3/uL (ref 4.0–10.5)

## 2010-09-17 LAB — POCT I-STAT, CHEM 8
BUN: 16 mg/dL (ref 6–23)
Chloride: 91 mEq/L — ABNORMAL LOW (ref 96–112)
Creatinine, Ser: 1.3 mg/dL — ABNORMAL HIGH (ref 0.4–1.2)
Potassium: 4.2 mEq/L (ref 3.5–5.1)
Sodium: 123 mEq/L — ABNORMAL LOW (ref 135–145)

## 2010-09-17 LAB — DIFFERENTIAL
Basophils Relative: 1 % (ref 0–1)
Eosinophils Relative: 5 % (ref 0–5)
Monocytes Absolute: 1.7 10*3/uL — ABNORMAL HIGH (ref 0.1–1.0)
Monocytes Relative: 19 % — ABNORMAL HIGH (ref 3–12)
Neutrophils Relative %: 62 % (ref 43–77)

## 2010-09-17 LAB — POCT CARDIAC MARKERS
CKMB, poc: <1 ng/mL — ABNORMAL LOW (ref 1.0–8.0)
Myoglobin, poc: 173 ng/mL (ref 12–200)
Troponin i, poc: <0.05 ng/mL (ref 0.00–0.09)

## 2010-09-17 LAB — BASIC METABOLIC PANEL
BUN: 11 mg/dL (ref 6–23)
Calcium: 8.4 mg/dL (ref 8.4–10.5)
Creatinine, Ser: 0.99 mg/dL (ref 0.4–1.2)
GFR calc non Af Amer: 53 mL/min — ABNORMAL LOW (ref >60)
Potassium: 4 mEq/L (ref 3.5–5.1)

## 2010-09-17 LAB — HEPATIC FUNCTION PANEL
Albumin: 3.6 g/dL (ref 3.5–5.2)
Total Bilirubin: 1.2 mg/dL (ref 0.3–1.2)
Total Protein: 6.7 g/dL (ref 6.0–8.3)

## 2010-09-17 LAB — SODIUM, URINE, RANDOM: Sodium, Ur: 65 mEq/L

## 2010-09-17 LAB — CULTURE, BLOOD (ROUTINE X 2)
Culture  Setup Time: 201110302118
Culture: NO GROWTH

## 2010-09-17 LAB — FERRITIN: Ferritin: 163 ng/mL (ref 10–291)

## 2010-09-17 LAB — C-REACTIVE PROTEIN: CRP: 8.6 mg/dL — ABNORMAL HIGH (ref <0.6)

## 2010-09-17 LAB — IRON AND TIBC
Iron: 13 ug/dL — ABNORMAL LOW (ref 42–135)
UIBC: 252 ug/dL

## 2010-09-18 LAB — DIFFERENTIAL
Basophils Absolute: 0.1 10*3/uL (ref 0.0–0.1)
Eosinophils Absolute: 1.6 10*3/uL — ABNORMAL HIGH (ref 0.0–0.7)
Lymphocytes Relative: 10 % — ABNORMAL LOW (ref 12–46)
Lymphs Abs: 1.1 10*3/uL (ref 0.7–4.0)
Monocytes Relative: 10 % (ref 3–12)

## 2010-09-18 LAB — CBC
Platelets: 231 10*3/uL (ref 150–400)
RBC: 4.81 MIL/uL (ref 3.87–5.11)
RDW: 14.4 % (ref 11.5–15.5)
WBC: 11.3 10*3/uL — ABNORMAL HIGH (ref 4.0–10.5)

## 2010-09-18 LAB — BASIC METABOLIC PANEL
Calcium: 9.2 mg/dL (ref 8.4–10.5)
Creatinine, Ser: 0.9 mg/dL (ref 0.4–1.2)
GFR calc Af Amer: >60 mL/min (ref >60)
GFR calc non Af Amer: 59 mL/min — ABNORMAL LOW (ref >60)

## 2010-09-29 LAB — COMPREHENSIVE METABOLIC PANEL
ALT: 15 U/L (ref 0–35)
Albumin: 3.9 g/dL (ref 3.5–5.2)
Alkaline Phosphatase: 104 U/L (ref 39–117)
BUN: 23 mg/dL (ref 6–23)
Chloride: 95 mEq/L — ABNORMAL LOW (ref 96–112)
Potassium: 4.4 mEq/L (ref 3.5–5.1)
Sodium: 137 mEq/L (ref 135–145)
Total Bilirubin: 0.7 mg/dL (ref 0.3–1.2)
Total Protein: 7.3 g/dL (ref 6.0–8.3)

## 2010-09-29 LAB — CBC
HCT: 34.4 % — ABNORMAL LOW (ref 36.0–46.0)
Hemoglobin: 10.7 g/dL — ABNORMAL LOW (ref 12.0–15.0)
Platelets: 233 10*3/uL (ref 150–400)
WBC: 7.6 10*3/uL (ref 4.0–10.5)

## 2010-09-29 LAB — DIFFERENTIAL
Basophils Absolute: 0.1 10*3/uL (ref 0.0–0.1)
Basophils Relative: 1 % (ref 0–1)
Eosinophils Absolute: 0.2 10*3/uL (ref 0.0–0.7)
Eosinophils Relative: 3 % (ref 0–5)
Monocytes Absolute: 1.1 10*3/uL — ABNORMAL HIGH (ref 0.1–1.0)
Monocytes Relative: 14 % — ABNORMAL HIGH (ref 3–12)
Neutro Abs: 4.2 10*3/uL (ref 1.7–7.7)

## 2010-11-18 NOTE — Procedures (Signed)
LOWER EXTREMITY VENOUS REFLUX EXAM   INDICATION:  Swelling, chronic venous insufficiency.   EXAM:  Using color-flow imaging and pulse Doppler spectral analysis, the  right and left common femoral, superficial femoral, popliteal, posterior  tibial, greater and lesser saphenous veins are evaluated.  There is  evidence suggesting deep venous insufficiency in the right and left  lower extremities.   The right saphenofemoral junction is competent with Reflux of  >547milliseconds. The left GSV is not competent with Reflux of  >517milliseconds with the caliber as described below.  The right GSV is  not competent with reflux of >500 milliseconds with caliber as described  below.   The right and left proximal short saphenous vein demonstrates  competency.   GSV Diameter (used if found to be incompetent only)                                            Right    Left  Proximal Greater Saphenous Vein           0.58 cm  0.50 cm  Proximal-to-mid-thigh                     0.50 cm  cm  Mid thigh                                 0.32 cm  cm  Mid-distal thigh                          cm       cm  Distal thigh                              0.38 cm  cm  Knee                                      0.30 cm  cm   IMPRESSION:  1. The right greater saphenous vein is not competent with Reflux      >530milliseconds.  The left greater saphenous vein at the      saphenofemoral junction is not competent.  2. The right and left greater saphenous veins are not tortuous.  3. The deep venous system is not competent with Reflux of      >519milliseconds.  4. The right and left lesser saphenous vein is competent.    ___________________________________________  Nelda Severe. Kellie Simmering, M.D.   EM/MEDQ  D:  04/14/2010  T:  04/14/2010  Job:  [id number]

## 2010-11-18 NOTE — Consult Note (Signed)
NEW PATIENT CONSULTATION   [name], [name]  DOB:  [birth year]                                       04/14/2010  CHART#:[id number]   Patient is an 77 year old female referred by Dr. Philip Aspen for chronic  venous insufficiency.  This patient had an isolated episode of bleeding  from a small varicose vein in the right leg several months ago which  required a trip to the emergency department and eventually stopped.  She  has had no further problems from that.  She has had 4 episodes of  cellulitis, 3 in the left leg and 1 in the right leg, most recently in  the last 2 weeks on the right side, which required IV antibiotics for 10  days but did resolve.  She has had no stasis ulcers and does not  developed severe swelling as the day progresses, although she does have  some mild edema.  She has no history of DVT or thrombophlebitis and does  not wear elastic compression stockings nor elevate the leg on a regular  basis nor take pain medication.   CHRONIC MEDICAL PROBLEMS:  1. Hyperlipidemia.  2. Asthma, takes inhalers.  3. Macular degeneration of the right eye.  4. Negative for coronary artery disease, hypertension, diabetes, or      stroke.   SOCIAL HISTORY:  She is widowed and retired.  She does not use tobacco  or alcohol.   FAMILY HISTORY:  Negative for coronary artery disease, diabetes, or  stroke.  Review of systems is positive for decreased visual acuity and  decreased hearing.  Negative for chest pain, dyspnea on exertion,  chronic bronchitis.  Does have occasional wheezing, responds to  inhalers.  Has some mild arthritis.  Has a heart murmur.  Also complains  of occasional urinary frequency.  All other systems in the review of  systems are negative.   PHYSICAL EXAMINATION:  Blood pressure 192/91, heart rate is 90,  respirations 24.  Generally, she is a well-developed, well-nourished  elderly female who is in no apparent stress, alert and oriented x3.  HEENT:  Normal for age.  EOMs intact.  Lungs:  Clear to auscultation.  No rhonchi or wheezing.  Cardiovascular:  Regular rhythm.  No murmurs.  Carotid pulses are 3+.  No audible bruits.  Abdomen:  Soft, nontender  with no masses.  Musculoskeletal:  Free of major deformities.  Neurologic:  Normal.  Skin is free of rashes.  Lower extremity exam  reveals 3+ femoral, popliteal, and 3+ dorsalis pedis pulses palpable  bilaterally.  She has diffuse spider and reticular veins in both lower  extremities in the distal thigh and calf area, particularly pretibially  on the right where there is a prominent reticular vein where the  bleeding occurred several months ago.  She has no stasis ulcers.  No  hyperpigmentation or active ulcers.   Today I ordered a venous duplex exam which I have reviewed and  interpreted.  She has mild reflux in the deep system bilaterally.  The  right and left legs have no significant reflux in the great saphenous  systems, but there are incompetent perforators below the knee  bilaterally which are not large.  Small saphenous veins are  unremarkable.   This patient does have diffuse reticular and spider veins which do not  require treatment at this time.  If she has another episode of bleeding,  she should have sclerotherapy of the area in question in the right leg.  Any episodes of cellulitis should be treated with IV antibiotics, as  they have been done successfully by Dr. Philip Aspen.  I have no other  recommendations, and no treatment is recommended at this time.     Nelda Severe Kellie Simmering, M.D.  Electronically Signed   JDL/MEDQ  D:  04/14/2010  T:  04/15/2010  Job:  [id number]   cc:   Ermalene Searing. Philip Aspen, M.D.

## 2011-06-22 ENCOUNTER — Encounter: Payer: Self-pay | Admitting: Internal Medicine

## 2011-07-09 DIAGNOSIS — R0602 Shortness of breath: Secondary | ICD-10-CM | POA: Diagnosis not present

## 2011-07-09 DIAGNOSIS — I4891 Unspecified atrial fibrillation: Secondary | ICD-10-CM | POA: Diagnosis not present

## 2011-07-20 DIAGNOSIS — I4891 Unspecified atrial fibrillation: Secondary | ICD-10-CM | POA: Diagnosis not present

## 2011-07-20 DIAGNOSIS — I10 Essential (primary) hypertension: Secondary | ICD-10-CM | POA: Diagnosis not present

## 2011-07-20 DIAGNOSIS — D509 Iron deficiency anemia, unspecified: Secondary | ICD-10-CM | POA: Diagnosis not present

## 2011-07-20 DIAGNOSIS — Q820 Hereditary lymphedema: Secondary | ICD-10-CM | POA: Diagnosis not present

## 2011-07-21 DIAGNOSIS — I10 Essential (primary) hypertension: Secondary | ICD-10-CM | POA: Diagnosis not present

## 2011-07-21 DIAGNOSIS — D509 Iron deficiency anemia, unspecified: Secondary | ICD-10-CM | POA: Diagnosis not present

## 2011-07-21 DIAGNOSIS — I4891 Unspecified atrial fibrillation: Secondary | ICD-10-CM | POA: Diagnosis not present

## 2011-07-28 ENCOUNTER — Encounter: Payer: Self-pay | Admitting: Internal Medicine

## 2011-07-29 ENCOUNTER — Encounter: Payer: Self-pay | Admitting: Internal Medicine

## 2011-07-29 ENCOUNTER — Ambulatory Visit (INDEPENDENT_AMBULATORY_CARE_PROVIDER_SITE_OTHER): Payer: Medicare Other | Admitting: Internal Medicine

## 2011-07-29 DIAGNOSIS — R609 Edema, unspecified: Secondary | ICD-10-CM

## 2011-07-29 DIAGNOSIS — I4891 Unspecified atrial fibrillation: Secondary | ICD-10-CM | POA: Insufficient documentation

## 2011-07-29 DIAGNOSIS — IMO0002 Reserved for concepts with insufficient information to code with codable children: Secondary | ICD-10-CM

## 2011-07-29 NOTE — Patient Instructions (Signed)
Your physician recommends that you schedule a follow-up appointment in: 2 months with Dr Lovena Le.

## 2011-07-29 NOTE — Assessment & Plan Note (Signed)
The patient has no significant symptoms regarding her atrial fibrillation. I have discussed the importance of anti-coagulation. She will continue this. With regard to rate control, she appears to be reasonably well controlled at this point. In the future, I would consider uptitration of her AV nodal blocking drugs.

## 2011-07-29 NOTE — Assessment & Plan Note (Signed)
The patient has chronic peripheral edema with evidence of venous stasis. I have discussed the importance of a low sodium diet (which she is non-compliant), wearing her support stockings, keeping her legs elevated, and continuing her diuretics. She may be a candidate for more potent diuretics.

## 2011-07-29 NOTE — Progress Notes (Signed)
HPI Lori [name] is referred today for evaluation of atrial fibrillation. She is a pleasant 78 yo woman with a h/o HTN. She was found to have atrial fibrillation several weeks ago on routine followup. She was initially referred to Dr. Einar Gip where a 2D echo demonstrated normal LV function. She has never had syncope and she denies c/p or syncope. She does have chronic dyspnea on exertion. She also has chronic peripheral edema. These symptoms have been present for many months. She has been on verapamil but was intolerant of metoprolol. She has been on Maxiide but has peristent edema.   Allergies  Allergen Reactions  . Dust Mite Extract   . Mold Extract (Trichophyton Mentagrophyte)      Current Outpatient Prescriptions  Medication Sig Dispense Refill  . ciclesonide (OMNARIS) 50 MCG/ACT nasal spray Place 2 sprays into both nostrils as directed.      . dabigatran (PRADAXA) 75 MG CAPS Take by mouth every 12 (twelve) hours.      . ferrous sulfate 325 (65 FE) MG tablet Take 325 mg by mouth daily with breakfast.      . folic acid (FOLVITE) Q000111Q MCG tablet Take 800 mcg by mouth daily.      . Multiple Vitamins-Minerals (CENTRUM SILVER PO) Take by mouth daily.      . Multiple Vitamins-Minerals (ICAPS MV PO) Take by mouth daily.      . Olopatadine HCl (PATANASE NA) Place into the nose as directed.      Marland Kitchen omeprazole (PRILOSEC) 40 MG capsule Take 40 mg by mouth 2 (two) times daily.      . Probiotic Product (ALIGN PO) Take by mouth as directed.      . Selenium 200 MCG TABS Take by mouth daily.      Marland Kitchen triamterene-hydrochlorothiazide (MAXZIDE-25) 37.5-25 MG per tablet Take 1 tablet by mouth daily.      . verapamil (CALAN) 120 MG tablet Take 120 mg by mouth 2 (two) times daily.      . vitamin C (ASCORBIC ACID) 500 MG tablet Take 500 mg by mouth 2 (two) times daily.         Past Medical History  Diagnosis Date  . Chronic venous insufficiency   . Cellulitis   . Chronic acquired lymphedema   . Asthma   .  Hyperlipidemia     ROS:   All systems reviewed and negative except as noted in the HPI.   Past Surgical History  Procedure Date  . Left total knee replacement   . Right total hip replacement      Family History  Problem Relation Age of Onset  . Other Father     unknown causes  . Lung cancer Mother   . Breast cancer Neg Hx   . Diabetes Neg Hx      History   Social History  . Marital Status: Widowed    Spouse Name: N/A    Number of Children: N/A  . Years of Education: N/A   Occupational History  . Not on file.   Social History Main Topics  . Smoking status: Former Smoker    Quit date: 07/28/1959  . Smokeless tobacco: Not on file  . Alcohol Use: No  . Drug Use: Not on file  . Sexually Active: Not on file   Other Topics Concern  . Not on file   Social History Narrative  . No narrative on file     BP 142/72  Pulse 94  Ht 5\' 5"  (1.651  m)  Wt 87.091 kg (192 lb)  BMI 31.95 kg/m2  Physical Exam:  Well appearing NAD HEENT: Unremarkable Neck:  No JVD, no thyromegally Lungs:  Clear with no wheezes, rales, or rhonchi. HEART:  Regular rate rhythm, no murmurs, no rubs, no clicks Abd:  soft, positive bowel sounds, no organomegally, no rebound, no guarding Ext:  2 plus pulses, no edema, no cyanosis, no clubbing Skin:  No rashes no nodules Neuro:  CN II through XII intact, motor grossly intact  ECG Atrial fibrillation with a controlled ventricular response.  Assess/Plan:

## 2011-07-31 ENCOUNTER — Institutional Professional Consult (permissible substitution): Payer: BC Managed Care – HMO | Admitting: Internal Medicine

## 2011-08-18 ENCOUNTER — Telehealth: Payer: Self-pay | Admitting: Internal Medicine

## 2011-08-18 DIAGNOSIS — R609 Edema, unspecified: Secondary | ICD-10-CM

## 2011-08-18 NOTE — Telephone Encounter (Signed)
Spoke with patient, she says her left leg is much better but she is still having swelling in the rt.  I let her know I would discuss with Dr Lovena Le and call her back

## 2011-08-18 NOTE — Telephone Encounter (Signed)
New Problem:     Patient called in because her home help caregiver wanted her to increase the dose of her water pill because the swelling in her right leg is not progressing as smoothly as her left leg, and she wanted to double check with Dr. Lovena Le first. Please call back.

## 2011-08-26 ENCOUNTER — Encounter: Payer: Self-pay | Admitting: Internal Medicine

## 2011-08-26 NOTE — Telephone Encounter (Signed)
Spoke with daughter today, she is just concerned because her mom is still having swelling in her legs  The rt leg is worse than the left.  She is also questioning if she should increase her Verapamil Her resting HR per the daughter at rest is 100.  I told her I would discuss with Dr Lovena Le tomorrow and call her back in the afternoon

## 2011-08-26 NOTE — Telephone Encounter (Signed)
Pt needs to increase her water pill because she still has edema. Pt's grand-daughter is very concerned and wants a call asap

## 2011-08-26 NOTE — Telephone Encounter (Signed)
Lori [name] 08/26/2011 9:24 AM Signed  Pt needs to increase her water pill because she still has edema. Pt's grand-daughter is very concerned and wants a call asap

## 2011-08-26 NOTE — Telephone Encounter (Signed)
This encounter was created in error - please disregard.

## 2011-08-27 MED ORDER — FUROSEMIDE 20 MG PO TABS: 20.0000 mg | ORAL_TABLET | Freq: Every day | ORAL | Status: DC

## 2011-08-27 NOTE — Telephone Encounter (Signed)
Discussed with Dr Vergia Alberts to start Furosemide 20mg  daily and have a follow up BMP in 7 days on 09/04/11 patient aware

## 2011-09-04 ENCOUNTER — Other Ambulatory Visit (INDEPENDENT_AMBULATORY_CARE_PROVIDER_SITE_OTHER): Payer: Medicare Other

## 2011-09-04 DIAGNOSIS — R609 Edema, unspecified: Secondary | ICD-10-CM

## 2011-09-04 LAB — BASIC METABOLIC PANEL
CO2: 29 mEq/L (ref 19–32)
GFR: 44.4 mL/min — ABNORMAL LOW (ref >60.00)
Glucose, Bld: 97 mg/dL (ref 70–99)
Potassium: 4 mEq/L (ref 3.5–5.1)
Sodium: 138 mEq/L (ref 135–145)

## 2011-09-08 DIAGNOSIS — L259 Unspecified contact dermatitis, unspecified cause: Secondary | ICD-10-CM | POA: Diagnosis not present

## 2011-09-20 ENCOUNTER — Other Ambulatory Visit: Payer: Self-pay | Admitting: Internal Medicine

## 2011-09-22 DIAGNOSIS — H353290 Exudative age-related macular degeneration, unspecified eye, stage unspecified: Secondary | ICD-10-CM | POA: Diagnosis not present

## 2011-09-24 DIAGNOSIS — H353290 Exudative age-related macular degeneration, unspecified eye, stage unspecified: Secondary | ICD-10-CM | POA: Diagnosis not present

## 2011-09-24 DIAGNOSIS — Z961 Presence of intraocular lens: Secondary | ICD-10-CM | POA: Diagnosis not present

## 2011-09-29 ENCOUNTER — Encounter: Payer: Self-pay | Admitting: Internal Medicine

## 2011-09-29 ENCOUNTER — Telehealth: Payer: Self-pay | Admitting: Internal Medicine

## 2011-09-29 ENCOUNTER — Ambulatory Visit (INDEPENDENT_AMBULATORY_CARE_PROVIDER_SITE_OTHER): Payer: Medicare Other | Admitting: Internal Medicine

## 2011-09-29 VITALS — BP 124/68 | HR 82 | Wt 201.8 lb

## 2011-09-29 DIAGNOSIS — I4891 Unspecified atrial fibrillation: Secondary | ICD-10-CM

## 2011-09-29 DIAGNOSIS — R609 Edema, unspecified: Secondary | ICD-10-CM

## 2011-09-29 DIAGNOSIS — IMO0002 Reserved for concepts with insufficient information to code with codable children: Secondary | ICD-10-CM

## 2011-09-29 NOTE — Assessment & Plan Note (Signed)
She is instructed to maintain a low-sodium diet and to continue her low-dose diuretic therapy.

## 2011-09-29 NOTE — Telephone Encounter (Signed)
Patient aware of results.

## 2011-09-29 NOTE — Patient Instructions (Signed)
Your physician wants you to follow-up in: 6 months with Dr Taylor You will receive a reminder letter in the mail two months in advance. If you don't receive a letter, please call our office to schedule the follow-up appointment.  

## 2011-09-29 NOTE — Progress Notes (Signed)
HPI Lori [name] returns today for followup. She is a pleasant 78 -year-old woman with a history of atrial fibrillation and chronic diastolic heart failure. She has been anticoagulated and has tolerated her Pradaxa very nicely. She denies any bleeding problems. Despite maintaining a low salt diet and taking low-dose diuretic, her peripheral edema persists. This has not bothered her much. She keeps her feet elevated and wear support stockings. She denies chest pain. She is class II heart failure symptoms. Allergies  Allergen Reactions  . Dust Mite Extract   . Mold Extract (Trichophyton Mentagrophyte)      Current Outpatient Prescriptions  Medication Sig Dispense Refill  . ciclesonide (OMNARIS) 50 MCG/ACT nasal spray Place 2 sprays into both nostrils as directed.      . dabigatran (PRADAXA) 75 MG CAPS Take by mouth every 12 (twelve) hours.      . folic acid (FOLVITE) Q000111Q MCG tablet Take 800 mcg by mouth daily.      . furosemide (LASIX) 20 MG tablet Take 1 tablet (20 mg total) by mouth daily.  30 tablet  11  . Multiple Vitamins-Minerals (CENTRUM SILVER PO) Take by mouth daily.      . Multiple Vitamins-Minerals (ICAPS MV PO) Take by mouth daily.      Marland Kitchen omeprazole (PRILOSEC) 40 MG capsule Take 40 mg by mouth 2 (two) times daily.      . Probiotic Product (ALIGN PO) Take by mouth as directed.      . Selenium 200 MCG TABS Take by mouth daily.      . verapamil (VERELAN PM) 120 MG 24 hr capsule TAKE ONE CAPSULE TWICE A DAY  60 capsule  7  . vitamin C (ASCORBIC ACID) 500 MG tablet Take 500 mg by mouth 2 (two) times daily.         Past Medical History  Diagnosis Date  . Chronic venous insufficiency   . Cellulitis   . Chronic acquired lymphedema   . Asthma   . Hyperlipidemia     ROS:   All systems reviewed and negative except as noted in the HPI.   Past Surgical History  Procedure Date  . Left total knee replacement   . Right total hip replacement      Family History  Problem  Relation Age of Onset  . Other Father     unknown causes  . Lung cancer Mother   . Breast cancer Neg Hx   . Diabetes Neg Hx      History   Social History  . Marital Status: Widowed    Spouse Name: N/A    Number of Children: N/A  . Years of Education: N/A   Occupational History  . Not on file.   Social History Main Topics  . Smoking status: Former Smoker    Quit date: 07/28/1959  . Smokeless tobacco: Not on file  . Alcohol Use: No  . Drug Use: Not on file  . Sexually Active: Not on file   Other Topics Concern  . Not on file   Social History Narrative  . No narrative on file     BP 124/68  Pulse 82  Wt 91.536 kg (201 lb 12.8 oz)  Physical Exam:  Well appearing 78 year old woman, NAD HEENT: Unremarkable Neck:  No JVD, no thyromegally Lungs:  Clear with no wheezes, rales, or rhonchi. HEART:  Regular rate rhythm, no murmurs, no rubs, no clicks Abd:  soft, positive bowel sounds, no organomegally, no rebound, no guarding Ext:  2  plus pulses, 2+ edema bilaterally, no cyanosis, no clubbing Skin:  No rashes no nodules Neuro:  CN II through XII intact, motor grossly intact  EKG Atrial fibrillation with a controlled ventricular response.   Assess/Plan:

## 2011-09-29 NOTE — Assessment & Plan Note (Signed)
Her atrial fibrillation appears to be chronic. Her rate appears to be well controlled. Because of her peripheral edema, I considered switching from verapamil to another drug. However, her edema is only cosmetic and is now bothering her. She will continue her current medical therapy.

## 2011-09-29 NOTE — Telephone Encounter (Signed)
Pt forgot to get her lab results during her visit so can you please call her

## 2011-10-02 ENCOUNTER — Encounter: Payer: Self-pay | Admitting: Internal Medicine

## 2011-10-23 ENCOUNTER — Other Ambulatory Visit: Payer: Self-pay | Admitting: *Deleted

## 2011-10-23 ENCOUNTER — Telehealth: Payer: Self-pay | Admitting: Internal Medicine

## 2011-10-23 DIAGNOSIS — J45909 Unspecified asthma, uncomplicated: Secondary | ICD-10-CM | POA: Diagnosis not present

## 2011-10-23 DIAGNOSIS — J309 Allergic rhinitis, unspecified: Secondary | ICD-10-CM | POA: Diagnosis not present

## 2011-10-23 NOTE — Telephone Encounter (Signed)
Called Lori [name] and advised we will have to stop each med (pradaxa/verapamil)for a few days to see if itching stops--advised to start with verapamil--hold for 3 days and if itching stops that's the med she's allergic to---if itching continues restart verapamil and hold pradaxa for 3 days--call us with results--per dr taylor--pt states she will call us --nt

## 2011-10-23 NOTE — Telephone Encounter (Signed)
Pt thinks she is having a reaction to medication and she can not give me the name other than pradaxa

## 2011-10-23 NOTE — Telephone Encounter (Signed)
PT calling stating she has developed an itch all over her body and she's not sure which med is causing it--she has been on pradaxa for about 3 weeks and started verapamil on ?--advised i would talk to dr taylor and phone her back--pt agrees--nt

## 2011-11-04 ENCOUNTER — Telehealth: Payer: Self-pay | Admitting: Internal Medicine

## 2011-11-04 NOTE — Telephone Encounter (Signed)
Pt having swelling and redness in legs, stockings causing itching so took them off , pls advise [phone]

## 2011-11-05 DIAGNOSIS — H353290 Exudative age-related macular degeneration, unspecified eye, stage unspecified: Secondary | ICD-10-CM | POA: Diagnosis not present

## 2011-11-05 DIAGNOSIS — Z961 Presence of intraocular lens: Secondary | ICD-10-CM | POA: Diagnosis not present

## 2011-11-05 NOTE — Telephone Encounter (Signed)
She has cellulitis and is on antibiotics for it.  Dr Sharlett Iles gave her this and she is better.  She will call me back if things don't improve.

## 2011-11-11 ENCOUNTER — Encounter (HOSPITAL_COMMUNITY): Payer: Self-pay | Admitting: Internal Medicine

## 2011-11-11 ENCOUNTER — Inpatient Hospital Stay (HOSPITAL_COMMUNITY)
Admission: AD | Admit: 2011-11-11 | Discharge: 2011-11-16 | DRG: 603 | Disposition: A | Discharge status: Home Health | Payer: Medicare Other | Source: Ambulatory Visit | Attending: Internal Medicine | Admitting: Internal Medicine

## 2011-11-11 DIAGNOSIS — I890 Lymphedema, not elsewhere classified: Secondary | ICD-10-CM | POA: Diagnosis present

## 2011-11-11 DIAGNOSIS — I10 Essential (primary) hypertension: Secondary | ICD-10-CM | POA: Diagnosis present

## 2011-11-11 DIAGNOSIS — I4892 Unspecified atrial flutter: Secondary | ICD-10-CM | POA: Diagnosis present

## 2011-11-11 DIAGNOSIS — Z96649 Presence of unspecified artificial hip joint: Secondary | ICD-10-CM

## 2011-11-11 DIAGNOSIS — E785 Hyperlipidemia, unspecified: Secondary | ICD-10-CM | POA: Diagnosis present

## 2011-11-11 DIAGNOSIS — Z7902 Long term (current) use of antithrombotics/antiplatelets: Secondary | ICD-10-CM | POA: Diagnosis not present

## 2011-11-11 DIAGNOSIS — I4891 Unspecified atrial fibrillation: Secondary | ICD-10-CM | POA: Diagnosis present

## 2011-11-11 DIAGNOSIS — Z87891 Personal history of nicotine dependence: Secondary | ICD-10-CM | POA: Diagnosis not present

## 2011-11-11 DIAGNOSIS — R05 Cough: Secondary | ICD-10-CM | POA: Diagnosis present

## 2011-11-11 DIAGNOSIS — L02419 Cutaneous abscess of limb, unspecified: Secondary | ICD-10-CM | POA: Diagnosis not present

## 2011-11-11 DIAGNOSIS — D509 Iron deficiency anemia, unspecified: Secondary | ICD-10-CM

## 2011-11-11 DIAGNOSIS — J45909 Unspecified asthma, uncomplicated: Secondary | ICD-10-CM | POA: Diagnosis present

## 2011-11-11 DIAGNOSIS — I872 Venous insufficiency (chronic) (peripheral): Secondary | ICD-10-CM | POA: Diagnosis present

## 2011-11-11 DIAGNOSIS — K219 Gastro-esophageal reflux disease without esophagitis: Secondary | ICD-10-CM | POA: Diagnosis present

## 2011-11-11 DIAGNOSIS — R059 Cough, unspecified: Secondary | ICD-10-CM

## 2011-11-11 DIAGNOSIS — D569 Thalassemia, unspecified: Secondary | ICD-10-CM | POA: Diagnosis present

## 2011-11-11 DIAGNOSIS — Z79899 Other long term (current) drug therapy: Secondary | ICD-10-CM

## 2011-11-11 DIAGNOSIS — R609 Edema, unspecified: Secondary | ICD-10-CM

## 2011-11-11 DIAGNOSIS — L03119 Cellulitis of unspecified part of limb: Secondary | ICD-10-CM | POA: Diagnosis present

## 2011-11-11 DIAGNOSIS — IMO0002 Reserved for concepts with insufficient information to code with codable children: Secondary | ICD-10-CM

## 2011-11-11 DIAGNOSIS — R600 Localized edema: Secondary | ICD-10-CM

## 2011-11-11 DIAGNOSIS — Z96659 Presence of unspecified artificial knee joint: Secondary | ICD-10-CM | POA: Diagnosis not present

## 2011-11-11 DIAGNOSIS — Z860100 Personal history of colon polyps, unspecified: Secondary | ICD-10-CM

## 2011-11-11 DIAGNOSIS — D649 Anemia, unspecified: Secondary | ICD-10-CM

## 2011-11-11 HISTORY — DX: Anemia, unspecified: D64.9

## 2011-11-11 HISTORY — DX: Cardiac arrhythmia, unspecified: I49.9

## 2011-11-11 HISTORY — DX: Essential (primary) hypertension: I10

## 2011-11-11 LAB — CBC
HCT: 30.7 % — ABNORMAL LOW (ref 36.0–46.0)
Hemoglobin: 9.9 g/dL — ABNORMAL LOW (ref 12.0–15.0)
MCHC: 32.2 g/dL (ref 30.0–36.0)
WBC: 7.6 10*3/uL (ref 4.0–10.5)

## 2011-11-11 LAB — COMPREHENSIVE METABOLIC PANEL
ALT: 30 U/L (ref 0–35)
BUN: 17 mg/dL (ref 6–23)
CO2: 28 mEq/L (ref 19–32)
Calcium: 9.5 mg/dL (ref 8.4–10.5)
Creatinine, Ser: 1.12 mg/dL — ABNORMAL HIGH (ref 0.50–1.10)
GFR calc Af Amer: 49 mL/min — ABNORMAL LOW (ref >90)
GFR calc non Af Amer: 42 mL/min — ABNORMAL LOW (ref >90)
Glucose, Bld: 97 mg/dL (ref 70–99)
Total Protein: 6.5 g/dL (ref 6.0–8.3)

## 2011-11-11 MED ORDER — VANCOMYCIN HCL 1000 MG IV SOLR
750.0000 mg | INTRAVENOUS | Status: DC
  Administered 2011-11-12 – 2011-11-16 (×5): 750 mg via INTRAVENOUS
  Filled 2011-11-11 (×6): qty 750

## 2011-11-11 MED ORDER — FOLIC ACID 800 MCG PO TABS: 800.0000 ug | ORAL_TABLET | Freq: Every day | ORAL | Status: DC

## 2011-11-11 MED ORDER — ALIGN 4 MG PO CAPS: ORAL_CAPSULE | Freq: Every morning | ORAL | Status: DC

## 2011-11-11 MED ORDER — SENNOSIDES-DOCUSATE SODIUM 8.6-50 MG PO TABS
1.0000 | ORAL_TABLET | Freq: Every evening | ORAL | Status: DC | PRN
  Filled 2011-11-11: qty 1

## 2011-11-11 MED ORDER — VERAPAMIL HCL ER 120 MG PO CP24: 120.0000 mg | ORAL_CAPSULE | Freq: Every day | ORAL | Status: DC

## 2011-11-11 MED ORDER — CENTRUM SILVER PO TABS: 1.0000 | ORAL_TABLET | Freq: Every morning | ORAL | Status: DC

## 2011-11-11 MED ORDER — ADULT MULTIVITAMIN W/MINERALS CH
1.0000 | ORAL_TABLET | Freq: Every day | ORAL | Status: DC
  Administered 2011-11-11 – 2011-11-15 (×7): 1 via ORAL
  Filled 2011-11-11 (×7): qty 1

## 2011-11-11 MED ORDER — DABIGATRAN ETEXILATE MESYLATE 75 MG PO CAPS
75.0000 mg | ORAL_CAPSULE | Freq: Two times a day (BID) | ORAL | Status: DC
  Administered 2011-11-11 – 2011-11-16 (×11): 75 mg via ORAL
  Filled 2011-11-11 (×14): qty 1

## 2011-11-11 MED ORDER — POTASSIUM CHLORIDE IN NACL 20-0.9 MEQ/L-% IV SOLN
INTRAVENOUS | Status: DC
  Administered 2011-11-11: 10 mL/h via INTRAVENOUS
  Filled 2011-11-11 (×3): qty 1000

## 2011-11-11 MED ORDER — ACETAMINOPHEN 325 MG PO TABS: 650.0000 mg | ORAL_TABLET | Freq: Four times a day (QID) | ORAL | Status: DC | PRN

## 2011-11-11 MED ORDER — FOLIC ACID 1 MG PO TABS
1.0000 mg | ORAL_TABLET | Freq: Every day | ORAL | Status: DC
  Administered 2011-11-12 – 2011-11-16 (×5): 1 mg via ORAL
  Filled 2011-11-11 (×8): qty 1

## 2011-11-11 MED ORDER — ICAPS MV PO TABS: ORAL_TABLET | Freq: Every morning | ORAL | Status: DC

## 2011-11-11 MED ORDER — FUROSEMIDE 40 MG PO TABS
40.0000 mg | ORAL_TABLET | Freq: Every day | ORAL | Status: DC
  Administered 2011-11-11 – 2011-11-16 (×6): 40 mg via ORAL
  Filled 2011-11-11 (×7): qty 1

## 2011-11-11 MED ORDER — VITAMIN C 500 MG PO TABS
500.0000 mg | ORAL_TABLET | Freq: Two times a day (BID) | ORAL | Status: DC
  Administered 2011-11-11 – 2011-11-16 (×10): 500 mg via ORAL
  Filled 2011-11-11 (×12): qty 1

## 2011-11-11 MED ORDER — SELENIUM 200 MCG PO TABS: ORAL_TABLET | Freq: Every day | ORAL | Status: DC

## 2011-11-11 MED ORDER — VERAPAMIL HCL ER 120 MG PO TBCR
120.0000 mg | EXTENDED_RELEASE_TABLET | Freq: Two times a day (BID) | ORAL | Status: DC
  Administered 2011-11-11 – 2011-11-16 (×9): 120 mg via ORAL
  Filled 2011-11-11 (×14): qty 1

## 2011-11-11 MED ORDER — PROMETHAZINE HCL 25 MG PO TABS: 12.5000 mg | ORAL_TABLET | Freq: Four times a day (QID) | ORAL | Status: DC | PRN

## 2011-11-11 MED ORDER — CICLESONIDE 50 MCG/ACT NA SUSP: 2.0000 | Freq: Two times a day (BID) | NASAL | Status: DC | PRN

## 2011-11-11 MED ORDER — ACETAMINOPHEN 650 MG RE SUPP: 650.0000 mg | Freq: Four times a day (QID) | RECTAL | Status: DC | PRN

## 2011-11-11 MED ORDER — HYDROCODONE-ACETAMINOPHEN 5-325 MG PO TABS
1.0000 | ORAL_TABLET | ORAL | Status: DC | PRN
  Administered 2011-11-14 – 2011-11-15 (×4): 2 via ORAL
  Filled 2011-11-11 (×4): qty 2

## 2011-11-11 MED ORDER — PANTOPRAZOLE SODIUM 40 MG PO TBEC
80.0000 mg | DELAYED_RELEASE_TABLET | Freq: Every day | ORAL | Status: DC
  Administered 2011-11-12 – 2011-11-16 (×5): 80 mg via ORAL
  Filled 2011-11-11 (×3): qty 1
  Filled 2011-11-11 (×3): qty 2

## 2011-11-11 MED ORDER — VANCOMYCIN HCL 1000 MG IV SOLR
750.0000 mg | Freq: Once | INTRAVENOUS | Status: AC
  Administered 2011-11-11: 750 mg via INTRAVENOUS
  Filled 2011-11-11: qty 750

## 2011-11-11 MED ORDER — BISACODYL 10 MG RE SUPP: 10.0000 mg | Freq: Every day | RECTAL | Status: DC | PRN

## 2011-11-11 MED ORDER — SPIRONOLACTONE 25 MG PO TABS
25.0000 mg | ORAL_TABLET | Freq: Every day | ORAL | Status: DC
  Administered 2011-11-11 – 2011-11-16 (×6): 25 mg via ORAL
  Filled 2011-11-11 (×7): qty 1

## 2011-11-11 MED ORDER — PROSIGHT PO TABS
1.0000 | ORAL_TABLET | Freq: Every day | ORAL | Status: DC
  Administered 2011-11-11 – 2011-11-12 (×2): 1 via ORAL
  Filled 2011-11-11 (×3): qty 1

## 2011-11-11 MED ORDER — ZOLPIDEM TARTRATE 5 MG PO TABS: 5.0000 mg | ORAL_TABLET | Freq: Every evening | ORAL | Status: DC | PRN

## 2011-11-11 MED ORDER — CICLESONIDE 50 MCG/ACT NA SUSP: 2.0000 | Freq: Every day | NASAL | Status: DC

## 2011-11-11 MED ORDER — OCUVITE-LUTEIN PO CAPS
1.0000 | ORAL_CAPSULE | Freq: Every day | ORAL | Status: DC
  Administered 2011-11-11: 1 via ORAL
  Filled 2011-11-11 (×2): qty 1

## 2011-11-11 NOTE — Care Management Note (Signed)
    Page 1 of 1   11/16/2011     2:07:14 PM   CARE MANAGEMENT NOTE 11/16/2011  Patient:  Lori [name], Lori [name]   Account Number:  [id number]  Date Initiated:  11/11/2011  Documentation initiated by:  Lori [name]  Subjective/Objective Assessment:   PT WAS ADMITTED WITH CELLULITIS AS SHE HAS HAD A FAILED OP TREATMENT COURSE     Action/Plan:   PROGRESSION OF CARE AND DISCHARGE PLANNING   Anticipated DC Date:  11/15/2011   Anticipated DC Plan:  [place]  CM consult      Choice offered to / List presented to:             Status of service:  Completed, signed off Medicare Important Message given?   (If response is "NO", the following Medicare IM given date fields will be blank) Date Medicare IM given:   Date Additional Medicare IM given:    Discharge Disposition:  Lori [name]  Per UR Regulation:  Reviewed for med. 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  11/11/11 Lori Pinks, RN, BSN [facility] PT LIVES ALONE IN A ONE BEDROOM APARTMENT.  HER SON LIVES ON [place] AND SHE HAS A FRIEND THAT IS LIKE HER GRANDAUGHTER ([name]) THAT TAKES CARE OF HER HERE IN [name].  PT DOESNT DRIVE.  SHE STATED THAT SHE HAD TAKEN PO ABX FOR 2 WKS PRIOR TO ADMISSION.  WILL F/U ON HH NEEDS. PT STATED THAT AT HER LAST DC SHE HAD HH WITH [facility] BUT WOULD LIKE TO TRY SOMEONE ELSE THIS TIME.

## 2011-11-11 NOTE — H&P (Signed)
Lori [name] is an 78 y.o. female.   Chief Complaint: cellulitis of both legs HPI:   The patient is a 78 year old Caucasian woman who is well-known to me.  She has chronic, moderately severe bilateral lower extremity edema from chronic venous insufficiency and lymphedema.  She has had multiple episodes of leg cellulitis.  Recently she was treated with clindamycin for worsening bilateral leg edema and erythema with oozing.  She just took her last dose of clindamycin today and notes no improvement in her symptoms.  She has not had any fever or chills, but continues to have moderate enlargement of both lower extremities with some weeping from the left leg.  She does not use compression hose as the material appears to irritate her legs.  She does have lymphedema pump that she reportedly uses 3-4 times a day.  She denies symptoms of shortness of breath or cough or chest pain.  Her medical history is otherwise significant for chronic atrial fibrillation for which she is on Pradaxa for stroke prevention.  She also has hypertension, hyperlipidemia, seasonal allergic rhinitis, gastroesophageal reflux disease, and chronic anemia that is multi-factorial and from iron deficiency as well as thalassemia.  She has declined a workup for her iron deficiency anemia.  She has no history of neoplasm.  She lives by herself with help from friends.  Past Medical History  Diagnosis Date  . Chronic venous insufficiency   . Cellulitis   . Chronic acquired lymphedema   . Asthma   . Hyperlipidemia   . Hypertension   . Dysrhythmia   . Anemia     Medications Prior to Admission  Medication Sig Dispense Refill  . ciclesonide (OMNARIS) 50 MCG/ACT nasal spray Place 2 sprays into both nostrils as directed.      . dabigatran (PRADAXA) 75 MG CAPS Take by mouth every 12 (twelve) hours.      . folic acid (FOLVITE) Q000111Q MCG tablet Take 800 mcg by mouth daily.      . furosemide (LASIX) 20 MG tablet Take 1 tablet (20 mg total) by  mouth daily.  30 tablet  11  . Multiple Vitamins-Minerals (CENTRUM SILVER PO) Take by mouth daily.      . Multiple Vitamins-Minerals (ICAPS MV PO) Take by mouth daily.      Marland Kitchen omeprazole (PRILOSEC) 40 MG capsule Take 40 mg by mouth 2 (two) times daily.      . Probiotic Product (ALIGN PO) Take by mouth as directed.      . Selenium 200 MCG TABS Take by mouth daily.      . verapamil (VERELAN PM) 120 MG 24 hr capsule TAKE ONE CAPSULE TWICE A DAY  60 capsule  7  . vitamin C (ASCORBIC ACID) 500 MG tablet Take 500 mg by mouth 2 (two) times daily.        ADDITIONAL HOME MEDICATIONS: Elocon 0.1% cream apply to rash twice daily as needed, Pradaxa 75 mg by mouth twice daily, verapamil extended release 120 mg by mouth daily, folate one tab by mouth daily, selenium one tab by mouth daily, co-enzyme Q10 take 1 tab by mouth daily, Align probiotic once daily, Patanase take 1-2 sprays each nostril twice daily, Omnaris nasal spray take 2 sprays each nostril once daily, vitamin C 500 mg take 2 tabs daily, ICaps take one tablet twice daily, Symbicort 160/4.5 take 2 inhalations twice daily, omeprazole 40 mg by mouth twice daily, iron sulfate 325 mg by mouth twice daily, Maxide 25 take 1 tab by  mouth every morning, Proventil HFA take 2 puffs by mouth every 4-6 hours as needed, Allegra 180 mg by mouth every other day  PHYSICIANS INVOLVED IN CARE: [facility] (Dr. Lovena Le), Scarlette Shorts (GI), Leanna Battles (PCP)  Past Surgical History  Procedure Date  . Left total knee replacement   . Right total hip replacement   . Joint replacement     Family History  Problem Relation Age of Onset  . Other Father     unknown causes  . Lung cancer Mother   . Breast cancer Neg Hx   . Diabetes Neg Hx      Social History:  reports that she quit smoking about 52 years ago. She does not have any smokeless tobacco history on file. She reports that she does not drink alcohol or use illicit drugs.  Allergies:  Allergies    Allergen Reactions  . Dust Mite Extract   . Mold Extract (Trichophyton Mentagrophyte)      ROS: anemia, ankle swelling, asthma, heart palpitation, high blood pressure and chronic moderately severe bilateral lower extremity edema, bilateral mild hearing loss  PHYSICAL EXAM: Blood pressure 158/90, pulse 101, temperature 98.3 F (36.8 C), temperature source Oral, resp. rate 18, height 5\' 5"  (1.651 m), weight 92 kg (202 lb 13.2 oz), SpO2 92.00%. In general, she is a mildly overweight white woman who was in no apparent distress while sitting in a chair.  HEENT exam was within normal limits, neck was supple without jugular venous distention or carotid bruit, chest was clear to auscultation, heart had an irregularly irregular rhythm without significant murmur or gallop, abdomen had normal bowel sounds and no hepatosplenomegaly or tenderness, she had bilateral 3+ edema of the legs with some oozing of serous fluid from the left leg, and she had bilateral erythema of the legs to the level of the knees on the left side and to the distal one third of the thigh on the right side.  Neurologic exam: She was alert and well oriented with normal affect, she has mildly decreased hearing, she is able to ambulate independently.  No results found for this or any previous visit (from the past 48 hour(s)). No results found.   Assessment/Plan #1 Bilateral Leg Cellulitis: Severe and recurrent.  It is hard for me to understand how this could happen with using her lymphedema pump as regularly as she describes.  She has failed outpatient treatment with clindamycin and will be treated with IV vancomycin.  In addition, we will increase her diuretic treatment and use sequential compression hose to decrease her leg edema. #2 Atrial Fibrillation: Stable on verapamil for rate control and Pradaxa for stroke prevention. #3 Anemia: Clinically stable and determined to be from iron deficiency as well as thalassemia.  We will recheck  a CBC today. #4 Asthma: Stable on current medications.  Shaunak Kreis G 11/11/2011, 12:59 PM

## 2011-11-11 NOTE — Progress Notes (Signed)
ANTIBIOTIC CONSULT NOTE - INITIAL  Pharmacy Consult for vancomycin Indication: LE cellulitis  Allergies  Allergen Reactions  . Dust Mite Extract   . Mold Extract (Trichophyton Mentagrophyte)     Patient Measurements: Height: 5\' 5"  (165.1 cm) Weight: 202 lb 13.2 oz (92 kg) IBW/kg (Calculated) : 57  Adjusted Body Weight:   Vital Signs: Temp: 98.3 F (36.8 C) (05/08 1209) Temp src: Oral (05/08 1209) BP: 158/90 mmHg (05/08 1209) Pulse Rate: 101  (05/08 1209) Intake/Output from previous day:   Intake/Output from this shift:    Labs: No results found for this basename: WBC:3,HGB:3,PLT:3,LABCREA:3,CREATININE:3 in the last 72 hours Estimated Creatinine Clearance: 34.9 ml/min (by C-G formula based on Cr of 1.2). No results found for this basename: VANCOTROUGH:2,VANCOPEAK:2,VANCORANDOM:2,GENTTROUGH:2,GENTPEAK:2,GENTRANDOM:2,TOBRATROUGH:2,TOBRAPEAK:2,TOBRARND:2,AMIKACINPEAK:2,AMIKACINTROU:2,AMIKACIN:2, in the last 72 hours   Microbiology: No results found for this or any previous visit (from the past 720 hour(s)).  Medical History: Past Medical History  Diagnosis Date  . Chronic venous insufficiency   . Cellulitis   . Chronic acquired lymphedema   . Asthma   . Hyperlipidemia   . Hypertension   . Dysrhythmia   . Anemia     Medications:  Scheduled:    . ciclesonide  2 spray Each Nare Daily  . dabigatran  75 mg Oral Q12H  . furosemide  40 mg Oral Daily  . pantoprazole  80 mg Oral Q1200  . spironolactone  25 mg Oral Daily  . vitamin C  500 mg Oral BID  . DISCONTD: ALIGN   Oral q morning - 10a  . DISCONTD: CENTRUM SILVER  1 tablet Oral q morning - 10a  . DISCONTD: folic acid  [id number] mcg Oral Daily  . DISCONTD: ICaps MV   Oral q morning - 10a  . DISCONTD: Selenium   Oral Daily  . DISCONTD: verapamil  120 mg Oral QHS   Assessment: 89 YOF admitted for LE cellulitis that did not improve on course of oral clindamycin as outpatient.  She has h/o BL LE chronic venous stasis  and lymphedema with multiple episodes of cellulitis in past.  Orders to start vancomycin per  Rx. No labs availabe, SCr in march 2013 was 1.2 for est CrCl ~3ml/min  Goal of Therapy:  Vancomycin trough level 10-15 mcg/ml  Plan:  1. Vancomycin 750mg  IV now, then 750 mg q24h (start 12h after 1st dose). Monitor renal fx and check trough if vancomycin continues >5 days.  Clovis Riley 11/11/2011,1:45 PM

## 2011-11-11 NOTE — Progress Notes (Signed)
*  Preliminary Results* Bilateral lower extremity venous duplex completed. Bilateral lower extremities are negative for deep vein thrombosis.  11/11/2011 3:23 PM  Luna Kitchens, RDMS,RDCS

## 2011-11-12 LAB — BASIC METABOLIC PANEL
Calcium: 9.2 mg/dL (ref 8.4–10.5)
GFR calc Af Amer: 50 mL/min — ABNORMAL LOW (ref >90)
GFR calc non Af Amer: 43 mL/min — ABNORMAL LOW (ref >90)
Potassium: 4.3 mEq/L (ref 3.5–5.1)
Sodium: 137 mEq/L (ref 135–145)

## 2011-11-12 MED ORDER — PROSIGHT PO TABS
1.0000 | ORAL_TABLET | Freq: Two times a day (BID) | ORAL | Status: DC
  Administered 2011-11-13 – 2011-11-16 (×6): 1 via ORAL
  Filled 2011-11-12 (×8): qty 1

## 2011-11-12 NOTE — Progress Notes (Signed)
Subjective: Her bilateral leg edema and erythema has improved overnight. She is having mild itching without a rash.  Objective: Vital signs in last 24 hours: Temp:  [97.1 F (36.2 C)-97.5 F (36.4 C)] 97.5 F (36.4 C) (05/09 1400) Pulse Rate:  [82-83] 82  (05/09 1400) Resp:  [18-20] 18  (05/09 1400) BP: (102-105)/(67-70) 102/70 mmHg (05/09 1400) SpO2:  [93 %-95 %] 95 % (05/09 1400) Weight:  [92.8 kg (204 lb 9.4 oz)] 92.8 kg (204 lb 9.4 oz) (05/09 0657) Weight change:    Intake/Output from previous day: 05/08 0701 - 05/09 0700 In: 120 [P.O.:120] Out: -    General appearance: alert, cooperative and no distress Resp: clear to auscultation bilaterally Cardio: irregularly irregular rhythm Extremities: bilateral 2+ leg edema with decreased bilateral erythema  Lab Results:  Basename 11/11/11 1357  WBC 7.6  HGB 9.9*  HCT 30.7*  PLT 200   BMET  Basename 11/12/11 0604 11/11/11 1357  NA 137 135  K 4.3 4.3  CL 100 97  CO2 29 28  GLUCOSE 101* 97  BUN 17 17  CREATININE 1.09 1.12*  CALCIUM 9.2 9.5   CMET CMP     Component Value Date/Time   NA 137 11/12/2011 0604   K 4.3 11/12/2011 0604   CL 100 11/12/2011 0604   CO2 29 11/12/2011 0604   GLUCOSE 101* 11/12/2011 0604   BUN 17 11/12/2011 0604   CREATININE 1.09 11/12/2011 0604   CALCIUM 9.2 11/12/2011 0604   PROT 6.5 11/11/2011 1357   ALBUMIN 3.5 11/11/2011 1357   AST 30 11/11/2011 1357   ALT 30 11/11/2011 1357   ALKPHOS 103 11/11/2011 1357   BILITOT 0.6 11/11/2011 1357   GFRNONAA 43* 11/12/2011 0604   GFRAA 50* 11/12/2011 0604    CBG (last 3)  No results found for this basename: GLUCAP:3 in the last 72 hours  INR RESULTS:   No results found for this basename: INR, PROTIME     Studies/Results: No results found.  Medications: I have reviewed the patient's current medications.  Assessment/Plan: #1 Cellullitis:  Improved with IV vancomycin. Will check BMET in the morning and consider increasing diuretic rx. I have advised the patient to  keep the SCD hose on at all times. #2 Anemia:  stable  LOS: 1 day   Ryett Hamman G 11/12/2011, 9:57 PM

## 2011-11-12 NOTE — Progress Notes (Signed)
UR COMPLETED  

## 2011-11-13 LAB — CBC
HCT: 29 % — ABNORMAL LOW (ref 36.0–46.0)
Hemoglobin: 9.4 g/dL — ABNORMAL LOW (ref 12.0–15.0)
MCHC: 32.4 g/dL (ref 30.0–36.0)
RDW: 15 % (ref 11.5–15.5)
WBC: 7.3 10*3/uL (ref 4.0–10.5)

## 2011-11-13 LAB — BASIC METABOLIC PANEL
BUN: 14 mg/dL (ref 6–23)
Chloride: 97 mEq/L (ref 96–112)
Creatinine, Ser: 1.05 mg/dL (ref 0.50–1.10)
GFR calc Af Amer: 53 mL/min — ABNORMAL LOW (ref >90)
GFR calc non Af Amer: 45 mL/min — ABNORMAL LOW (ref >90)
Potassium: 4 mEq/L (ref 3.5–5.1)

## 2011-11-13 NOTE — Progress Notes (Signed)
ANTIBIOTIC CONSULT NOTE - INITIAL  Pharmacy Consult for vancomycin Indication: LE cellulitis  Allergies  Allergen Reactions  . Dust Mite Extract   . Mold Extract (Trichophyton Mentagrophyte)     Patient Measurements: Height: 5\' 5"  (165.1 cm) Weight: 204 lb 9.4 oz (92.8 kg) IBW/kg (Calculated) : 57  Adjusted Body Weight:   Vital Signs: Temp: 98.1 F (36.7 C) (05/10 1125) Temp src: Oral (05/10 1125) BP: 129/29 mmHg (05/10 1125) Pulse Rate: 84  (05/10 1125) Intake/Output from previous day:   Intake/Output from this shift:    Labs:  Basename 11/13/11 0656 11/12/11 0604 11/11/11 1357  WBC 7.3 -- 7.6  HGB 9.4* -- 9.9*  PLT 191 -- 200  LABCREA -- -- --  CREATININE 1.05 1.09 1.12*   Estimated Creatinine Clearance: 40.1 ml/min (by C-G formula based on Cr of 1.05). No results found for this basename: VANCOTROUGH:2,VANCOPEAK:2,VANCORANDOM:2,GENTTROUGH:2,GENTPEAK:2,GENTRANDOM:2,TOBRATROUGH:2,TOBRAPEAK:2,TOBRARND:2,AMIKACINPEAK:2,AMIKACINTROU:2,AMIKACIN:2, in the last 72 hours   Microbiology: No results found for this or any previous visit (from the past 720 hour(s)).  Medical History: Past Medical History  Diagnosis Date  . Chronic venous insufficiency   . Cellulitis   . Chronic acquired lymphedema   . Asthma   . Hyperlipidemia   . Hypertension   . Dysrhythmia   . Anemia     Medications:  Scheduled:     . dabigatran  75 mg Oral Q12H  . folic acid  1 mg Oral Daily  . furosemide  40 mg Oral Daily  . mulitivitamin with minerals  1 tablet Oral Daily  . multivitamin  1 tablet Oral BID  . pantoprazole  80 mg Oral Q1200  . spironolactone  25 mg Oral Daily  . vancomycin  750 mg Intravenous Q24H  . verapamil  120 mg Oral BID  . vitamin C  500 mg Oral BID  . DISCONTD: multivitamin  1 tablet Oral Daily   Assessment: 89 YOF admitted for LE cellulitis that did not improve on course of oral clindamycin as outpatient.  She has h/o BL LE chronic venous stasis and  lymphedema with multiple episodes of cellulitis in past.  D#2 vancomycin.  Patient is afeb and WBC WNL.  No culture data. SCR is stable. Erythema improving per MD note.   Goal of Therapy:  Vancomycin trough level 10-15 mcg/ml  Plan:  1. Vancomycin 750mg  IV now, then 750 mg q24h (start 12h after 1st dose). Monitor renal fx and check trough if vancomycin continues >5 days.  Clovis Riley 11/13/2011,1:49 PM

## 2011-11-13 NOTE — Progress Notes (Signed)
Subjective: She feels fine and leg edema is slowly improving.   Objective: Vital signs in last 24 hours: Temp:  [98.1 F (36.7 C)-98.4 F (36.9 C)] 98.2 F (36.8 C) (05/10 1442) Pulse Rate:  [74-114] 114  (05/10 1442) Resp:  [20-84] 20  (05/10 1442) BP: (109-138)/(29-83) 138/83 mmHg (05/10 1442) SpO2:  [93 %-96 %] 96 % (05/10 1442) Weight change:    Intake/Output from previous day:     General appearance: alert, cooperative and no distress Resp: clear to auscultation bilaterally Cardio: irregularly irregular rhythm Extremities: bilateral 2+ leg edema  Lab Results:  Basename 11/13/11 0656 11/11/11 1357  WBC 7.3 7.6  HGB 9.4* 9.9*  HCT 29.0* 30.7*  PLT 191 200   BMET  Basename 11/13/11 0656 11/12/11 0604  NA 134* 137  K 4.0 4.3  CL 97 100  CO2 25 29  GLUCOSE 89 101*  BUN 14 17  CREATININE 1.05 1.09  CALCIUM 9.0 9.2   CMET CMP     Component Value Date/Time   NA 134* 11/13/2011 0656   K 4.0 11/13/2011 0656   CL 97 11/13/2011 0656   CO2 25 11/13/2011 0656   GLUCOSE 89 11/13/2011 0656   BUN 14 11/13/2011 0656   CREATININE 1.05 11/13/2011 0656   CALCIUM 9.0 11/13/2011 0656   PROT 6.5 11/11/2011 1357   ALBUMIN 3.5 11/11/2011 1357   AST 30 11/11/2011 1357   ALT 30 11/11/2011 1357   ALKPHOS 103 11/11/2011 1357   BILITOT 0.6 11/11/2011 1357   GFRNONAA 45* 11/13/2011 0656   GFRAA 53* 11/13/2011 0656    CBG (last 3)  No results found for this basename: GLUCAP:3 in the last 72 hours  INR RESULTS:   No results found for this basename: INR, PROTIME     Studies/Results: No results found.  Medications: I have reviewed the patient's current medications.  Assessment/Plan: #1 Cellulitis:  Improving, and hopefully within 48 hours we will be able to transition her to a po antibiotic such as septra or doxycycline to complete her treatment. She will also need to have an outpatient wound center visit arranged for treatment of minimal stasis ulcer of the left leg. #2  Afib:  Stable  on verapamil and pradaxa for stroke prophylaxis  LOS: 2 days   Lavontae Cornia G 11/13/2011, 6:25 PM

## 2011-11-14 LAB — BASIC METABOLIC PANEL
BUN: 17 mg/dL (ref 6–23)
Chloride: 95 mEq/L — ABNORMAL LOW (ref 96–112)
GFR calc Af Amer: 50 mL/min — ABNORMAL LOW (ref >90)
GFR calc non Af Amer: 43 mL/min — ABNORMAL LOW (ref >90)
Potassium: 4 mEq/L (ref 3.5–5.1)
Sodium: 134 mEq/L — ABNORMAL LOW (ref 135–145)

## 2011-11-14 MED ORDER — DOXYCYCLINE HYCLATE 100 MG PO TABS
100.0000 mg | ORAL_TABLET | Freq: Two times a day (BID) | ORAL | Status: DC
  Administered 2011-11-14 – 2011-11-16 (×5): 100 mg via ORAL
  Filled 2011-11-14 (×6): qty 1

## 2011-11-14 NOTE — Progress Notes (Signed)
Patient complaining of legs itching and very red.  She feels like the SCD's are causing them to itch.  She removed SCD's I applies some lotion and put some ice packs on them to help sooth

## 2011-11-14 NOTE — Progress Notes (Signed)
Subjective: Reports improvement in redness this am.  SCDs bothered her overnight so they were removed.    Objective: Vital signs in last 24 hours: Temp:  [98.1 F (36.7 C)-98.4 F (36.9 C)] 98.3 F (36.8 C) (05/11 0630) Pulse Rate:  [65-114] 101  (05/11 0630) Resp:  [18-84] 18  (05/11 0630) BP: (119-146)/(29-83) 146/82 mmHg (05/11 0630) SpO2:  [93 %-98 %] 93 % (05/11 0630) Weight:  [92 kg (202 lb 13.2 oz)-95.6 kg (210 lb 12.2 oz)] 92 kg (202 lb 13.2 oz) (05/11 0657) Weight change:  Last BM Date: 11/13/11  CBG (last 3)  No results found for this basename: GLUCAP:3 in the last 72 hours  Intake/Output from previous day: 05/10 0701 - 05/11 0700 In: 600 [P.O.:600] Out: -  Intake/Output this shift:    General appearance: alert and no distress Eyes: no scleral icterus Throat: oropharynx moist without erythema Resp: clear to auscultation bilaterally Cardio: irregularly irregular rhythm GI: soft, non-tender; bowel sounds normal; no masses,  no organomegaly Extremities: no clubbing, cyanosis; chronic venous stasis changes; slight warmth over distal right lower leg with bilateral erythema.  Lab Results:  [facility] 11/13/11 0656 11/12/11 0604  NA 134* 137  K 4.0 4.3  CL 97 100  CO2 25 29  GLUCOSE 89 101*  BUN 14 17  CREATININE 1.05 1.09  CALCIUM 9.0 9.2  MG -- --  PHOS -- --    Basename 11/11/11 1357  AST 30  ALT 30  ALKPHOS 103  BILITOT 0.6  PROT 6.5  ALBUMIN 3.5    Basename 11/13/11 0656 11/11/11 1357  WBC 7.3 7.6  NEUTROABS -- --  HGB 9.4* 9.9*  HCT 29.0* 30.7*  MCV 62.8* 63.2*  PLT 191 200    Studies/Results: No results found.   Medications: Scheduled:   . dabigatran  75 mg Oral Q12H  . folic acid  1 mg Oral Daily  . furosemide  40 mg Oral Daily  . mulitivitamin with minerals  1 tablet Oral Daily  . multivitamin  1 tablet Oral BID  . pantoprazole  80 mg Oral Q1200  . spironolactone  25 mg Oral Daily  . vancomycin  750 mg Intravenous Q24H  .  verapamil  120 mg Oral BID  . vitamin C  500 mg Oral BID   Continuous:   . 0.9 % NaCl with KCl 20 mEq / L 10 mL/hr (11/11/11 1534)    Assessment/Plan: Principal Problem: 1. *Cellulitis of leg- improving.  Will continue Vancomycin X another 24 hours and transition to oral Doxycycline.  Venous stasis is her biggest issue.  Advised her to elevate legs as much as possible. Active Problems: 2. Atrial fib/flutter- continue rate control and anticoagulation with Pradaxa. 3. Peripheral edema- verapamil may be contributing to chronic edema.  Consider alternative AV nodal agent if possible. 4. Disposition- anticipate discharge tomorrow.   LOS: 3 days   Peighton Edgin,W DOUGLAS 11/14/2011, 7:18 AM

## 2011-11-15 LAB — BASIC METABOLIC PANEL
CO2: 29 mEq/L (ref 19–32)
Chloride: 97 mEq/L (ref 96–112)
GFR calc Af Amer: 49 mL/min — ABNORMAL LOW (ref >90)
Potassium: 4.1 mEq/L (ref 3.5–5.1)

## 2011-11-15 NOTE — Progress Notes (Signed)
Subjective: She requests another day in the hospital- feels like her legs remain red and swollen.  Trying to elevate them- does not tolerate SCDs.    Objective: Vital signs in last 24 hours: Temp:  [97.6 F (36.4 C)-98.7 F (37.1 C)] 97.6 F (36.4 C) (05/12 0407) Pulse Rate:  [67-98] 91  (05/12 0407) Resp:  [17-20] 18  (05/12 0407) BP: (92-129)/(49-78) 129/78 mmHg (05/12 0407) SpO2:  [92 %-94 %] 94 % (05/12 0407) Weight:  [91.8 kg (202 lb 6.1 oz)] 91.8 kg (202 lb 6.1 oz) (05/12 0407) Weight change: -3.8 kg (-8 lb 6 oz) Last BM Date: 11/13/11  CBG (last 3)  No results found for this basename: GLUCAP:3 in the last 72 hours  Intake/Output from previous day:   Intake/Output this shift:    General appearance: alert and no distress Eyes: no scleral icterus Throat: oropharynx moist without erythema Resp: clear to auscultation bilaterally Cardio: irregularly irregular rhythm GI: soft, non-tender; bowel sounds normal; no masses,  no organomegaly Extremities: no clubbing, cyanosis; 1+ bilateral lower extremity edema with chronic venous stasis changes; erythema and warmth unchanged.  Lab Results:  [facility] 11/15/11 0610 11/14/11 0745  NA 136 134*  K 4.1 4.0  CL 97 95*  CO2 29 28  GLUCOSE 106* 101*  BUN 18 17  CREATININE 1.12* 1.09  CALCIUM 9.2 9.6  MG -- --  PHOS -- --     Basename 11/13/11 0656  WBC 7.3  NEUTROABS --  HGB 9.4*  HCT 29.0*  MCV 62.8*  PLT 191   No results found for this basename: INR, PROTIME   No results found for this basename: CKTOTAL:3,CKMB:3,CKMBINDEX:3,TROPONINI:3 in the last 72 hours No results found for this basename: TSH,T4TOTAL,FREET3,T3FREE,THYROIDAB in the last 72 hours No results found for this basename: VITAMINB12:2,FOLATE:2,FERRITIN:2,TIBC:2,IRON:2,RETICCTPCT:2 in the last 72 hours  Studies/Results: No results found.   Medications: Scheduled:   . dabigatran  75 mg Oral Q12H  . doxycycline  100 mg Oral Q12H  . folic acid  1 mg  Oral Daily  . furosemide  40 mg Oral Daily  . mulitivitamin with minerals  1 tablet Oral Daily  . multivitamin  1 tablet Oral BID  . pantoprazole  80 mg Oral Q1200  . spironolactone  25 mg Oral Daily  . vancomycin  750 mg Intravenous Q24H  . verapamil  120 mg Oral BID  . vitamin C  500 mg Oral BID   Continuous:   . 0.9 % NaCl with KCl 20 mEq / L 10 mL/hr (11/11/11 1534)    Assessment/Plan: 1. *Cellulitis of leg- slowly improving. Will continue Vancomycin X another 24 hours. Doxycycline added. Venous stasis is her biggest issue. Advised her to elevate legs as much as possible. Consider Unna boots once stable. Active Problems:  2. Atrial fib/flutter- continue rate control and anticoagulation with Pradaxa.  3. Peripheral edema- verapamil may be contributing to chronic edema. Consider alternative AV nodal agent if possible.  4. Disposition- anticipate discharge tomorrow.    LOS: 4 days   Carreen Milius,W DOUGLAS 11/15/2011, 7:14 AM

## 2011-11-16 LAB — CBC
HCT: 33.3 % — ABNORMAL LOW (ref 36.0–46.0)
Hemoglobin: 10.7 g/dL — ABNORMAL LOW (ref 12.0–15.0)
MCH: 20.4 pg — ABNORMAL LOW (ref 26.0–34.0)
MCV: 63.4 fL — ABNORMAL LOW (ref 78.0–100.0)
Platelets: 257 10*3/uL (ref 150–400)
RBC: 5.25 MIL/uL — ABNORMAL HIGH (ref 3.87–5.11)
WBC: 6.9 10*3/uL (ref 4.0–10.5)

## 2011-11-16 LAB — BASIC METABOLIC PANEL
CO2: 27 mEq/L (ref 19–32)
Chloride: 94 mEq/L — ABNORMAL LOW (ref 96–112)
Glucose, Bld: 93 mg/dL (ref 70–99)
Potassium: 4.1 mEq/L (ref 3.5–5.1)
Sodium: 132 mEq/L — ABNORMAL LOW (ref 135–145)

## 2011-11-16 MED ORDER — MOMETASONE FUROATE 0.1 % EX CREA
TOPICAL_CREAM | Freq: Every day | CUTANEOUS | Status: DC
  Filled 2011-11-16: qty 15

## 2011-11-16 MED ORDER — SPIRONOLACTONE 25 MG PO TABS: 25.0000 mg | ORAL_TABLET | Freq: Every day | ORAL | Status: DC

## 2011-11-16 MED ORDER — FUROSEMIDE 20 MG PO TABS: 40.0000 mg | ORAL_TABLET | Freq: Every day | ORAL | Status: DC

## 2011-11-16 MED ORDER — MOMETASONE FUROATE 0.1 % EX CREA: TOPICAL_CREAM | Freq: Every day | CUTANEOUS | Status: DC

## 2011-11-16 NOTE — Discharge Summary (Signed)
Physician Discharge Summary  Patient ID: TEMPY Lori [name] MRN: [id number] DOB/AGE: [birth year] 78 y.o.  Admit date: 11/11/2011 Discharge date: 11/16/2011   Discharge Diagnoses:  Principal Problem:  *Cellulitis of leg Active Problems:  Atrial fib/flutter, transient  Peripheral edema  ANEMIA, IRON DEFICIENCY  ANEMIA-UNSPECIFIED  ASTHMA UNSPECIFIED  Cough   Discharged Condition: good  Hospital Course: the patient is a 78 year old Caucasian woman who is well known to me.  She has chronic, moderately severe bilateral lower extremity edema from chronic venous insufficiency and lymphedema.  She has had multiple episodes of leg cellulitis.  Recently she was treated as an outpatient with clindamycin and completed her course of therapy today, with no change in her symptoms of bilateral leg edema and erythema.  She has not had any fever or chills.  Continues to have moderate edema of both lower extremity with some weeping from the left leg.  She does not use compression hose as the material appears to irritate her legs.  She does have lymphedema pumps that she reportedly uses 3-4 times daily.  She has not had problems with shortness of breath, cough, or chest pain.  Her medical history is otherwise significant for chronic atrial fibrillation for which she is on Pradaxa for stroke prevention, hypertension, hyperlipoidemia, seasonal allergic rhinitis, gastroesophageal reflux disease, chronic anemia from thalassemia and iron deficiency, and bilateral hearing loss.  She was admitted to the hospital for further evaluation.  She had bilateral DVT ultrasound exam done that showed no evidence of DVT.  She was started on vancomycin IV with gradual improvement in her symptoms.  She was noncompliant with use of air compression hose as she felt that the hose irritated her legs.  She did have an increase in her diuretic treatment with significant improved diuresis.  At the time of discharge she was alert and well  oriented with no shortness of breath or leg pain.  She continues to have moderate bilateral leg edema with erythema but no significant tenderness.there were no complications during her hospitalization.  Consults: None  Significant Diagnostic Studies:  No results found. She had a bilateral lower extremity DVT ultrasound exam that was negative for DVT.  Labs: Lab Results  Component Value Date   WBC 6.9 11/16/2011   HGB 10.7* 11/16/2011   HCT 33.3* 11/16/2011   MCV 63.4* 11/16/2011   PLT 257 11/16/2011     Lab 11/16/11 0500 11/11/11 1357  NA 132* --  K 4.1 --  CL 94* --  CO2 27 --  BUN 19 --  CREATININE 1.22* --  CALCIUM 9.4 --  PROT -- 6.5  BILITOT -- 0.6  ALKPHOS -- 103  ALT -- 30  AST -- 30  GLUCOSE 93 --       No results found for this basename: INR, PROTIME     No results found for this or any previous visit (from the past 240 hour(s)).    Discharge Exam: Blood pressure 127/68, pulse 89, temperature 97.5 F (36.4 C), temperature source Oral, resp. rate 20, height 5\' 5"  (1.651 m), weight 91.7 kg (202 lb 2.6 oz), SpO2 94.00%.  Physical Exam: In general, she is well-nourished and well-developed white woman who is in no apparent distress while sitting upright at the side of her bed having breakfast.  She continues to have mild hearing loss but one is able to communicate with her by raising the voice.  HEENT exam was within normal limits, neck supple without jugular venous distention or carotid bruit, chest was  clear to auscultation, heart had a slightly irregular rhythm without significant murmur or gallop, abdomen had normal bowel sounds and no tenderness, she had bilateral 2+ leg edema with minimal serous drainage from the left lower extremity.  Although there was mild erythema there was no significant tenderness on either leg.  Neurologic exam: She was alert and well oriented with normal affect.  She is able to walk without significant difficulty.  Disposition:she will be  discharged to home with the assistance from her close friend, Danielle Rankin.  We will arrange for her to have a home health nurse visit her for application of bilateral Unna boots and further teaching about chronic venous insufficiency and fluid retention.  She should schedule a followup visit with Dr. Leanna Battles within one week of discharge by calling [phone]. A new medications will be called into her regular local pharmacy.  She should also check her body weight upon return home and each day and bring these results to her followup office visit.  Discharge Orders    Future Orders Please Complete By Expires   Diet - low sodium heart healthy      Increase activity slowly      Discharge instructions      Comments:   She'll be discharged to home today.  We will arrange for home health nurse to visit her and apply Unna boots to both legs.  She should apply steroid cream to each leg once daily when not covered with the Unna boots.  She should continue to use her lymphedema pumps on her legs at least 3 times daily for 30 minutes at a time, and she should try to keep her legs elevated as much as possible.   Discharge wound care:      Comments:   Elocon cream applied to both legs once daily.  Unna boots per home health nurse when available     Medication List  As of 11/16/2011  9:55 AM   TAKE these medications         ALIGN PO   Take 1 capsule by mouth every morning.      beta carotene w/minerals tablet   Take 1 tablet by mouth 2 (two) times daily.      ciclesonide 50 MCG/ACT nasal spray   Commonly known as: OMNARIS   Place 1 spray into both nostrils daily.      dabigatran 75 MG Caps   Commonly known as: PRADAXA   Take 75 mg by mouth every 12 (twelve) hours.      ferrous sulfate 325 (65 FE) MG tablet   Take 325 mg by mouth 2 (two) times daily.      folic acid Q000111Q MCG tablet   Commonly known as: FOLVITE   Take 800 mcg by mouth daily.      furosemide 20 MG tablet   Commonly known  as: LASIX   Take 2 tablets (40 mg total) by mouth daily.      metoprolol tartrate 25 MG tablet   Commonly known as: LOPRESSOR   Take 25 mg by mouth 2 (two) times daily.      mometasone 0.1 % cream   Commonly known as: ELOCON   Apply topically daily.      mulitivitamin with minerals Tabs   Take 1 tablet by mouth daily.      omeprazole 40 MG capsule   Commonly known as: PRILOSEC   Take 40 mg by mouth 2 (two) times daily.  PATANASE NA   Place 1 spray into both nostrils 2 (two) times daily.      Selenium 200 MCG Tabs   Take 1 tablet by mouth daily.      spironolactone 25 MG tablet   Commonly known as: ALDACTONE   Take 1 tablet (25 mg total) by mouth daily.      verapamil 120 MG 24 hr capsule   Commonly known as: VERELAN PM   Take 120 mg by mouth 2 (two) times daily.      vitamin C 500 MG tablet   Commonly known as: ASCORBIC ACID   Take 500 mg by mouth 2 (two) times daily.             Signed: Donnajean Lopes 11/16/2011, 9:55 AM

## 2011-11-17 DIAGNOSIS — I10 Essential (primary) hypertension: Secondary | ICD-10-CM | POA: Diagnosis not present

## 2011-11-17 DIAGNOSIS — J45909 Unspecified asthma, uncomplicated: Secondary | ICD-10-CM | POA: Diagnosis not present

## 2011-11-17 DIAGNOSIS — L02419 Cutaneous abscess of limb, unspecified: Secondary | ICD-10-CM | POA: Diagnosis not present

## 2011-11-17 DIAGNOSIS — I872 Venous insufficiency (chronic) (peripheral): Secondary | ICD-10-CM | POA: Diagnosis not present

## 2011-11-17 DIAGNOSIS — I4891 Unspecified atrial fibrillation: Secondary | ICD-10-CM | POA: Diagnosis not present

## 2011-11-17 DIAGNOSIS — D509 Iron deficiency anemia, unspecified: Secondary | ICD-10-CM | POA: Diagnosis not present

## 2011-11-25 DIAGNOSIS — D508 Other iron deficiency anemias: Secondary | ICD-10-CM | POA: Diagnosis not present

## 2011-12-02 IMAGING — CR DG CHEST 2V
2 series · 2 of 2 positions shown · non-contrast
Comparison: None.

CLINICAL DATA: Chronic cough.

CHEST - 2 VIEW

[view not recorded (1 of 2)]
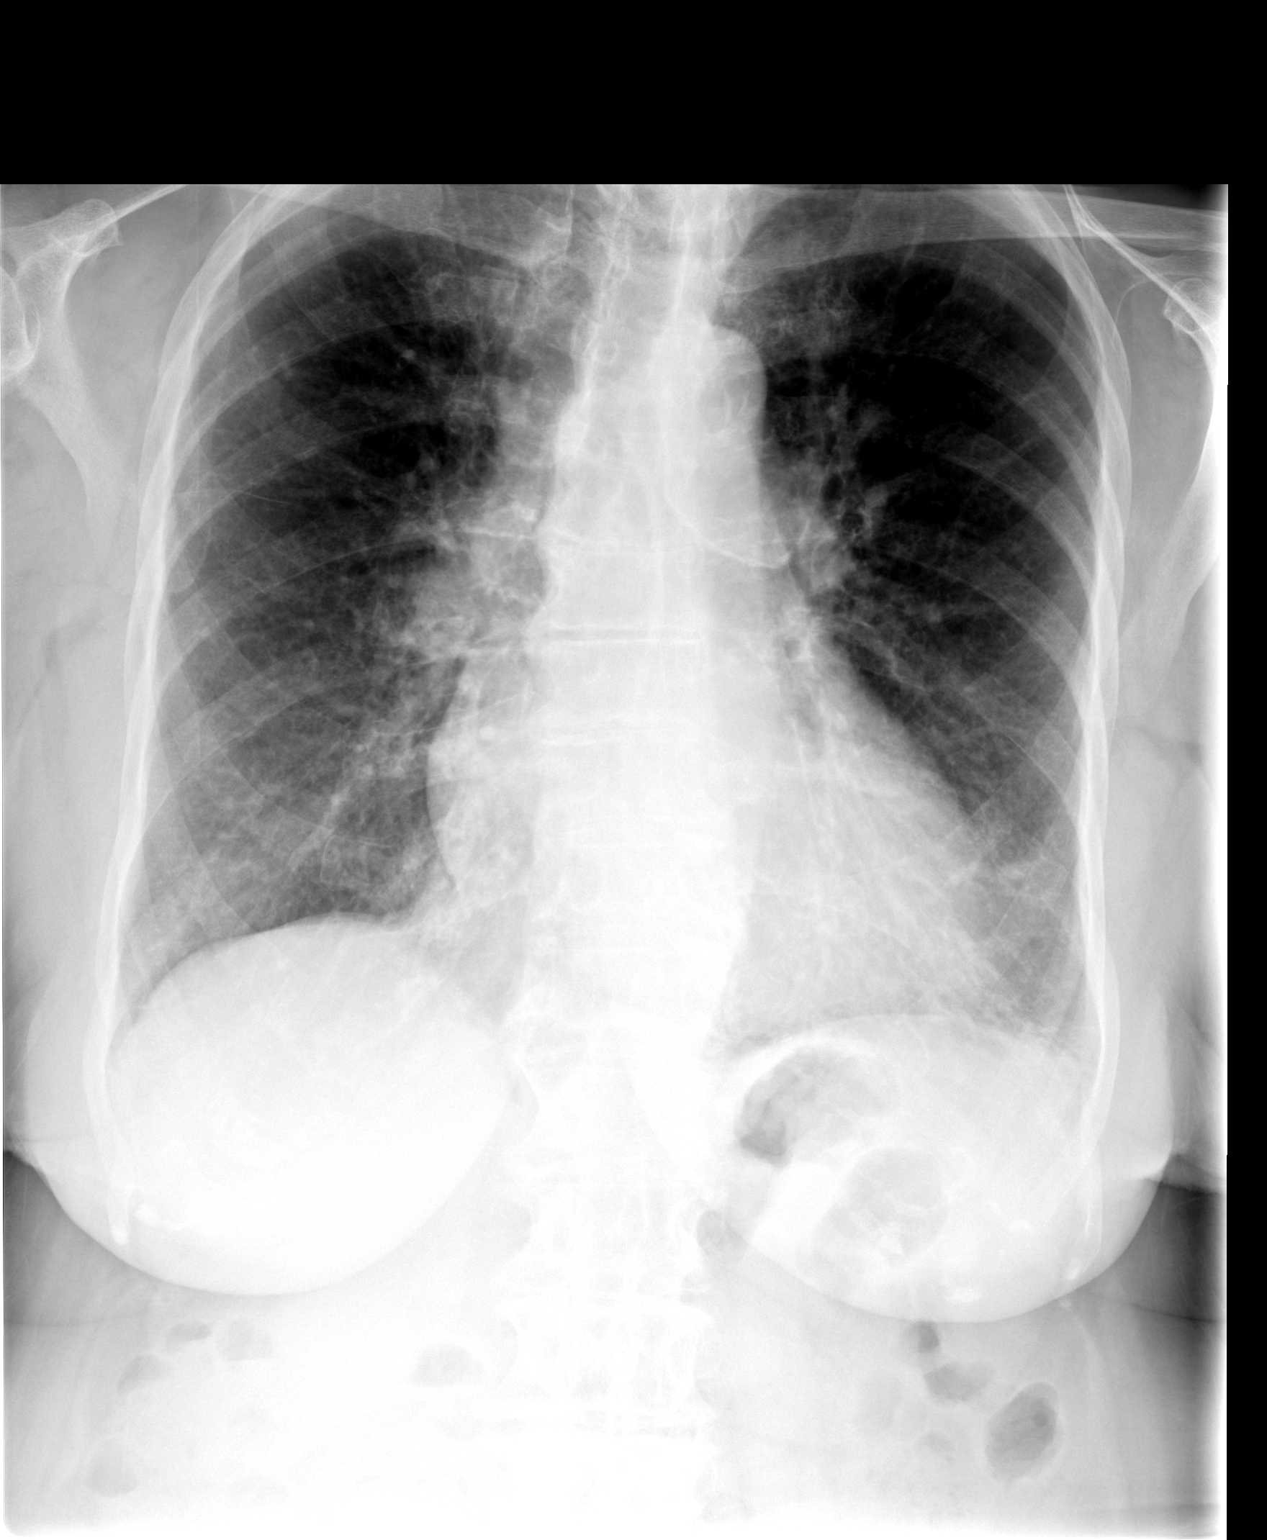

[view not recorded (2 of 2)]
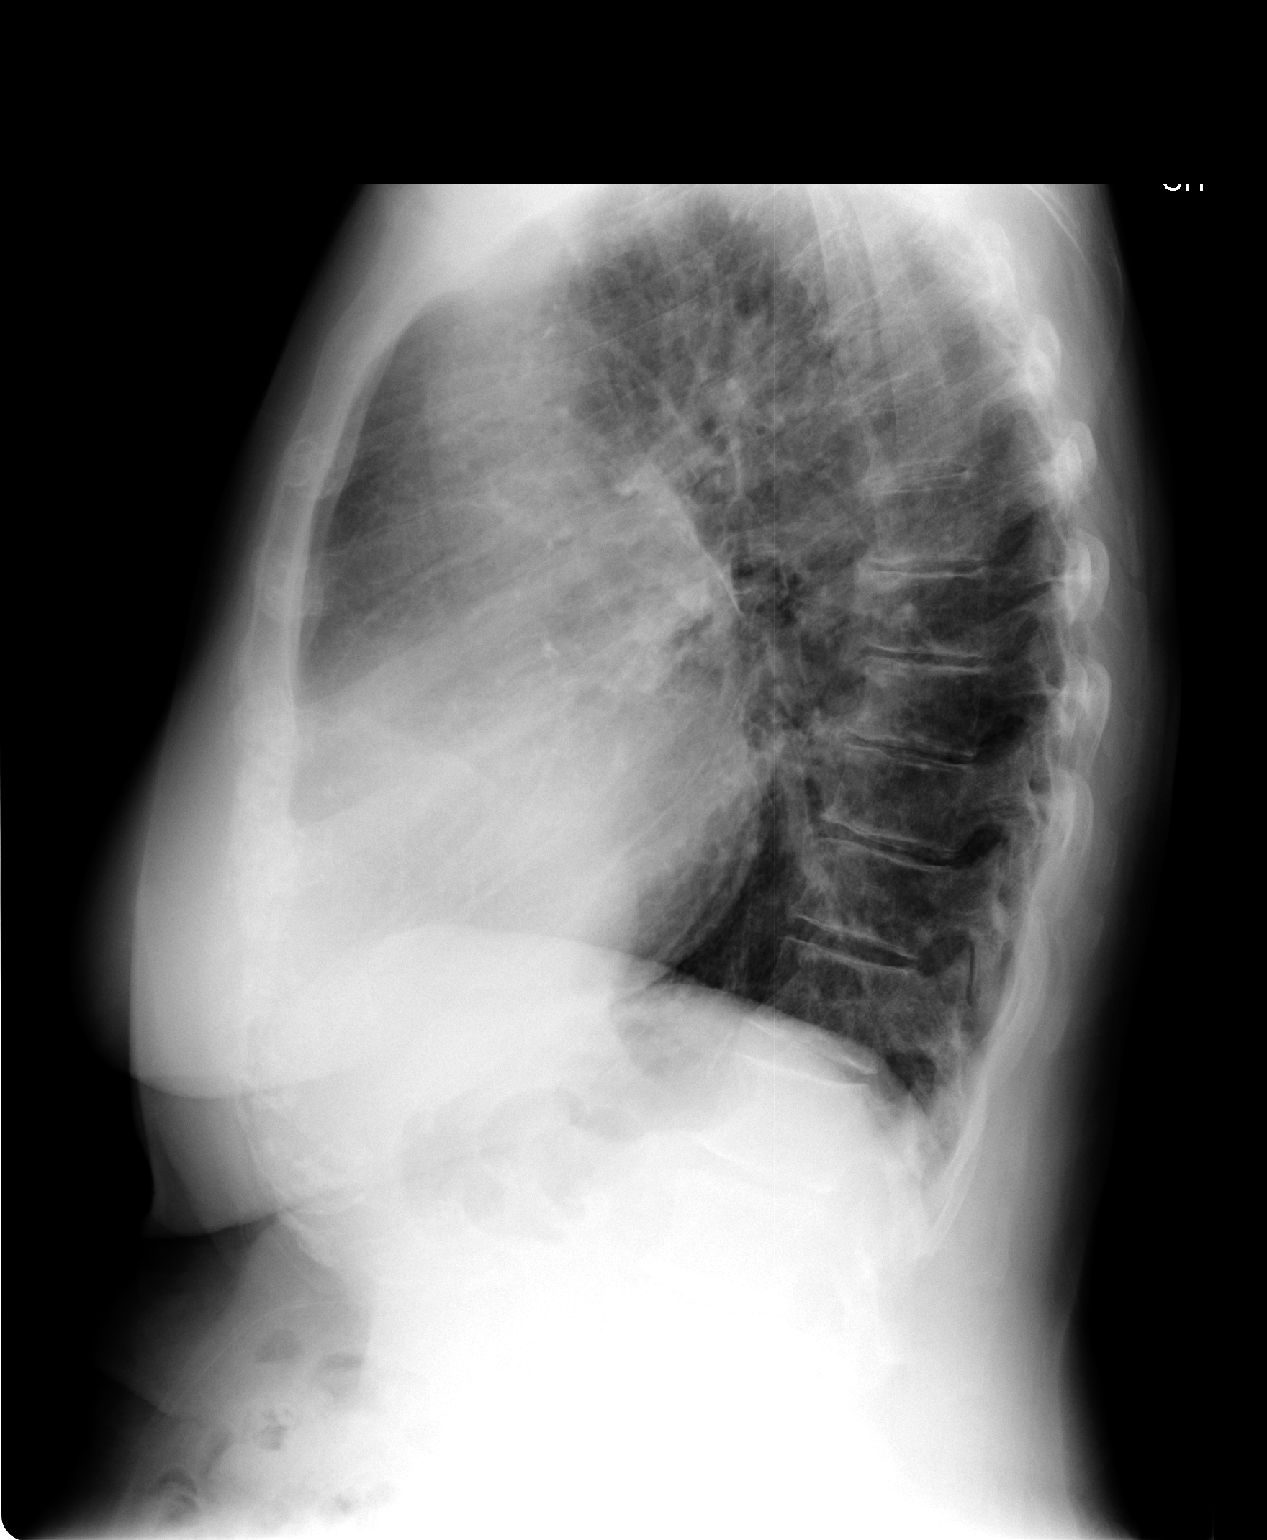

[2 of 2 positions shown; findings below may reference images not displayed]

FINDINGS: There is cardiomegaly.  There are diffusely accentuated
perihilar and bibasilar interstitial markings.  There is mild
patchy atelectasis/infiltrate within the left lower lobe.
Comparison with any older outside chest films the patient might
have would be helpful.  The central pulmonary arteries appear
prominent.  There is no evidence for mediastinal adenopathy.
IMPRESSION: 1.  Cardiomegaly with accentuated interstitial markings and
bronchovascular markings.
2.  Question mild patchy infiltrate within the left lower lobe.
3.  Any prior films the patient might have would be helpful for
comparison purposes.

## 2011-12-03 DIAGNOSIS — L820 Inflamed seborrheic keratosis: Secondary | ICD-10-CM | POA: Diagnosis not present

## 2011-12-03 DIAGNOSIS — L259 Unspecified contact dermatitis, unspecified cause: Secondary | ICD-10-CM | POA: Diagnosis not present

## 2011-12-04 DIAGNOSIS — I4891 Unspecified atrial fibrillation: Secondary | ICD-10-CM | POA: Diagnosis not present

## 2011-12-04 DIAGNOSIS — I8310 Varicose veins of unspecified lower extremity with inflammation: Secondary | ICD-10-CM | POA: Diagnosis not present

## 2011-12-16 DIAGNOSIS — I8310 Varicose veins of unspecified lower extremity with inflammation: Secondary | ICD-10-CM | POA: Diagnosis not present

## 2011-12-16 DIAGNOSIS — I4891 Unspecified atrial fibrillation: Secondary | ICD-10-CM | POA: Diagnosis not present

## 2011-12-16 DIAGNOSIS — J45909 Unspecified asthma, uncomplicated: Secondary | ICD-10-CM | POA: Diagnosis not present

## 2011-12-16 DIAGNOSIS — E871 Hypo-osmolality and hyponatremia: Secondary | ICD-10-CM | POA: Diagnosis not present

## 2011-12-17 DIAGNOSIS — Z961 Presence of intraocular lens: Secondary | ICD-10-CM | POA: Diagnosis not present

## 2011-12-17 DIAGNOSIS — H353290 Exudative age-related macular degeneration, unspecified eye, stage unspecified: Secondary | ICD-10-CM | POA: Diagnosis not present

## 2012-01-22 DIAGNOSIS — K5900 Constipation, unspecified: Secondary | ICD-10-CM | POA: Diagnosis not present

## 2012-01-22 DIAGNOSIS — I4891 Unspecified atrial fibrillation: Secondary | ICD-10-CM | POA: Diagnosis not present

## 2012-01-22 DIAGNOSIS — I8310 Varicose veins of unspecified lower extremity with inflammation: Secondary | ICD-10-CM | POA: Diagnosis not present

## 2012-03-31 ENCOUNTER — Encounter: Payer: Self-pay | Admitting: Internal Medicine

## 2012-03-31 ENCOUNTER — Ambulatory Visit (INDEPENDENT_AMBULATORY_CARE_PROVIDER_SITE_OTHER): Payer: Medicare Other | Admitting: Internal Medicine

## 2012-03-31 VITALS — BP 114/62 | HR 60 | Ht 65.0 in | Wt 191.0 lb

## 2012-03-31 DIAGNOSIS — IMO0002 Reserved for concepts with insufficient information to code with codable children: Secondary | ICD-10-CM

## 2012-03-31 DIAGNOSIS — I4891 Unspecified atrial fibrillation: Secondary | ICD-10-CM

## 2012-03-31 DIAGNOSIS — I10 Essential (primary) hypertension: Secondary | ICD-10-CM | POA: Insufficient documentation

## 2012-03-31 MED ORDER — FUROSEMIDE 20 MG PO TABS: 40.0000 mg | ORAL_TABLET | Freq: Every day | ORAL | Status: DC

## 2012-03-31 MED ORDER — SPIRONOLACTONE 25 MG PO TABS: 25.0000 mg | ORAL_TABLET | Freq: Every day | ORAL | Status: DC

## 2012-03-31 MED ORDER — METOPROLOL TARTRATE 25 MG PO TABS: 25.0000 mg | ORAL_TABLET | Freq: Two times a day (BID) | ORAL | Status: DC

## 2012-03-31 MED ORDER — VERAPAMIL HCL ER 120 MG PO CP24: 120.0000 mg | ORAL_CAPSULE | Freq: Two times a day (BID) | ORAL | Status: DC

## 2012-03-31 NOTE — Patient Instructions (Addendum)
Your physician wants you to follow-up in: 12 months with Dr. Taylor. You will receive a reminder letter in the mail two months in advance. If you don't receive a letter, please call our office to schedule the follow-up appointment.    

## 2012-03-31 NOTE — Progress Notes (Signed)
HPI Mrs. Ashmore returns today for followup. She is a very pleasant 78 year old woman with chronic atrial fibrillation and hypertension along with diastolic heart failure. In the interim, she has done well. Her chronic peripheral edema has been fairly well-controlled. Her blood pressure has been stable. She has not been in the hospital. She denies chest pain. She has class II heart failure symptoms. She has chronic 2+ lymphedema. She denies syncope. Allergies  Allergen Reactions  . Dust Mite Extract   . Latex   . Mold Extract (Trichophyton Mentagrophyte)      Current Outpatient Prescriptions  Medication Sig Dispense Refill  . beta carotene w/minerals (OCUVITE) tablet Take 1 tablet by mouth 2 (two) times daily.       . ciclesonide (OMNARIS) 50 MCG/ACT nasal spray Place 1 spray into both nostrils daily.      . dabigatran (PRADAXA) 75 MG CAPS Take 75 mg by mouth every 12 (twelve) hours.      . folic acid (FOLVITE) Q000111Q MCG tablet Take 800 mcg by mouth daily.      . furosemide (LASIX) 20 MG tablet Take 2 tablets (40 mg total) by mouth daily.  60 tablet  12  . metoprolol tartrate (LOPRESSOR) 25 MG tablet Take 1 tablet (25 mg total) by mouth 2 (two) times daily.  60 tablet  12  . mometasone (ELOCON) 0.1 % cream Apply topically daily.  45 g  1  . Multiple Vitamin (MULITIVITAMIN WITH MINERALS) TABS Take 1 tablet by mouth daily.      . Olopatadine HCl (PATANASE NA) Place 1 spray into both nostrils 2 (two) times daily.      Marland Kitchen omeprazole (PRILOSEC) 40 MG capsule Take 40 mg by mouth 2 (two) times daily.      . Probiotic Product (ALIGN PO) Take 1 capsule by mouth every morning.      . Selenium 200 MCG TABS Take 1 tablet by mouth daily.      Marland Kitchen spironolactone (ALDACTONE) 25 MG tablet Take 1 tablet (25 mg total) by mouth daily.  30 tablet  12  . verapamil (VERELAN PM) 120 MG 24 hr capsule Take 1 capsule (120 mg total) by mouth 2 (two) times daily.  60 capsule  12  . vitamin C (ASCORBIC ACID) 500 MG tablet  Take 500 mg by mouth 2 (two) times daily.      Marland Kitchen DISCONTD: metoprolol tartrate (LOPRESSOR) 25 MG tablet Take 25 mg by mouth 2 (two) times daily.      Marland Kitchen DISCONTD: spironolactone (ALDACTONE) 25 MG tablet Take 1 tablet (25 mg total) by mouth daily.  30 tablet  12  . DISCONTD: triamterene-hydrochlorothiazide (MAXZIDE-25) 37.5-25 MG per tablet Take 1 tablet by mouth daily.         Past Medical History  Diagnosis Date  . Chronic venous insufficiency   . Cellulitis   . Chronic acquired lymphedema   . Asthma   . Hyperlipidemia   . Hypertension   . Dysrhythmia   . Anemia     ROS:   All systems reviewed and negative except as noted in the HPI.   Past Surgical History  Procedure Date  . Left total knee replacement   . Right total hip replacement   . Joint replacement      Family History  Problem Relation Age of Onset  . Other Father     unknown causes  . Lung cancer Mother   . Breast cancer Neg Hx   . Diabetes Neg Hx  History   Social History  . Marital Status: Widowed    Spouse Name: N/A    Number of Children: N/A  . Years of Education: N/A   Occupational History  . Not on file.   Social History Main Topics  . Smoking status: Former Smoker    Quit date: 07/28/1959  . Smokeless tobacco: Not on file  . Alcohol Use: No  . Drug Use: No  . Sexually Active: Not on file   Other Topics Concern  . Not on file   Social History Narrative  . No narrative on file     BP 114/62  Pulse 60  Ht 5\' 5"  (1.651 m)  Wt 191 lb (86.637 kg)  BMI 31.78 kg/m2  Physical Exam:  Well appearing elderly woman, NAD HEENT: Unremarkable Neck:  No JVD, no thyromegally Lungs:  Clear with no wheezes, rales, or rhonchi. Well-healed pacemaker incision. HEART:  IRegular rate rhythm, no murmurs, no rubs, no clicks Abd:  soft, positive bowel sounds, no organomegally, no rebound, no guarding Ext:  2 plus pulses, no edema, no cyanosis, no clubbing Skin:  No rashes no nodules Neuro:   CN II through XII intact, motor grossly intact  ECG- atrial fibrillation with a controlled ventricular response   Assess/Plan:

## 2012-03-31 NOTE — Assessment & Plan Note (Signed)
Her atrial fibrillation is now chronic. Her ventricular rate is well controlled on a combination of beta blockers and calcium channel blockers. She will continue her chronic anticoagulation.

## 2012-03-31 NOTE — Assessment & Plan Note (Signed)
Her blood pressure is well controlled. She'll continue her current medical therapy and maintain a low-sodium diet.

## 2012-04-08 DIAGNOSIS — L97909 Non-pressure chronic ulcer of unspecified part of unspecified lower leg with unspecified severity: Secondary | ICD-10-CM | POA: Diagnosis not present

## 2012-04-08 DIAGNOSIS — I83009 Varicose veins of unspecified lower extremity with ulcer of unspecified site: Secondary | ICD-10-CM | POA: Diagnosis not present

## 2012-04-08 DIAGNOSIS — I4891 Unspecified atrial fibrillation: Secondary | ICD-10-CM | POA: Diagnosis not present

## 2012-04-08 DIAGNOSIS — I8310 Varicose veins of unspecified lower extremity with inflammation: Secondary | ICD-10-CM | POA: Diagnosis not present

## 2012-04-09 DIAGNOSIS — Z792 Long term (current) use of antibiotics: Secondary | ICD-10-CM | POA: Diagnosis not present

## 2012-04-09 DIAGNOSIS — I83219 Varicose veins of right lower extremity with both ulcer of unspecified site and inflammation: Secondary | ICD-10-CM | POA: Diagnosis not present

## 2012-04-09 DIAGNOSIS — L97929 Non-pressure chronic ulcer of unspecified part of left lower leg with unspecified severity: Secondary | ICD-10-CM | POA: Diagnosis not present

## 2012-04-09 DIAGNOSIS — Z4800 Encounter for change or removal of nonsurgical wound dressing: Secondary | ICD-10-CM | POA: Diagnosis not present

## 2012-04-09 DIAGNOSIS — I10 Essential (primary) hypertension: Secondary | ICD-10-CM | POA: Diagnosis not present

## 2012-04-09 DIAGNOSIS — I4891 Unspecified atrial fibrillation: Secondary | ICD-10-CM | POA: Diagnosis not present

## 2012-04-12 DIAGNOSIS — I10 Essential (primary) hypertension: Secondary | ICD-10-CM | POA: Diagnosis not present

## 2012-04-12 DIAGNOSIS — Z4800 Encounter for change or removal of nonsurgical wound dressing: Secondary | ICD-10-CM | POA: Diagnosis not present

## 2012-04-12 DIAGNOSIS — I4891 Unspecified atrial fibrillation: Secondary | ICD-10-CM | POA: Diagnosis not present

## 2012-04-12 DIAGNOSIS — I83219 Varicose veins of right lower extremity with both ulcer of unspecified site and inflammation: Secondary | ICD-10-CM | POA: Diagnosis not present

## 2012-04-12 DIAGNOSIS — Z792 Long term (current) use of antibiotics: Secondary | ICD-10-CM | POA: Diagnosis not present

## 2012-04-15 DIAGNOSIS — I10 Essential (primary) hypertension: Secondary | ICD-10-CM | POA: Diagnosis not present

## 2012-04-15 DIAGNOSIS — Z4800 Encounter for change or removal of nonsurgical wound dressing: Secondary | ICD-10-CM | POA: Diagnosis not present

## 2012-04-15 DIAGNOSIS — I4891 Unspecified atrial fibrillation: Secondary | ICD-10-CM | POA: Diagnosis not present

## 2012-04-15 DIAGNOSIS — Z792 Long term (current) use of antibiotics: Secondary | ICD-10-CM | POA: Diagnosis not present

## 2012-04-15 DIAGNOSIS — I83219 Varicose veins of right lower extremity with both ulcer of unspecified site and inflammation: Secondary | ICD-10-CM | POA: Diagnosis not present

## 2012-04-19 ENCOUNTER — Telehealth: Payer: Self-pay | Admitting: Internal Medicine

## 2012-04-19 DIAGNOSIS — I4891 Unspecified atrial fibrillation: Secondary | ICD-10-CM | POA: Diagnosis not present

## 2012-04-19 DIAGNOSIS — I10 Essential (primary) hypertension: Secondary | ICD-10-CM | POA: Diagnosis not present

## 2012-04-19 DIAGNOSIS — Z4800 Encounter for change or removal of nonsurgical wound dressing: Secondary | ICD-10-CM | POA: Diagnosis not present

## 2012-04-19 DIAGNOSIS — I83219 Varicose veins of right lower extremity with both ulcer of unspecified site and inflammation: Secondary | ICD-10-CM | POA: Diagnosis not present

## 2012-04-19 DIAGNOSIS — Z792 Long term (current) use of antibiotics: Secondary | ICD-10-CM | POA: Diagnosis not present

## 2012-04-19 NOTE — Telephone Encounter (Signed)
Spoke with patient and let her know to call her PCP for a walker but to call me back if there was a problem

## 2012-04-19 NOTE — Telephone Encounter (Signed)
plz return call to pt [phone]  Regarding request for a walker with a seat to enable pt to exercise and get out more

## 2012-04-22 DIAGNOSIS — I4891 Unspecified atrial fibrillation: Secondary | ICD-10-CM | POA: Diagnosis not present

## 2012-04-22 DIAGNOSIS — L97929 Non-pressure chronic ulcer of unspecified part of left lower leg with unspecified severity: Secondary | ICD-10-CM | POA: Diagnosis not present

## 2012-04-22 DIAGNOSIS — I10 Essential (primary) hypertension: Secondary | ICD-10-CM | POA: Diagnosis not present

## 2012-04-22 DIAGNOSIS — Z4800 Encounter for change or removal of nonsurgical wound dressing: Secondary | ICD-10-CM | POA: Diagnosis not present

## 2012-04-22 DIAGNOSIS — I83219 Varicose veins of right lower extremity with both ulcer of unspecified site and inflammation: Secondary | ICD-10-CM | POA: Diagnosis not present

## 2012-04-22 DIAGNOSIS — Z792 Long term (current) use of antibiotics: Secondary | ICD-10-CM | POA: Diagnosis not present

## 2012-04-25 DIAGNOSIS — L97919 Non-pressure chronic ulcer of unspecified part of right lower leg with unspecified severity: Secondary | ICD-10-CM | POA: Diagnosis not present

## 2012-04-25 DIAGNOSIS — I4891 Unspecified atrial fibrillation: Secondary | ICD-10-CM | POA: Diagnosis not present

## 2012-04-25 DIAGNOSIS — Z4800 Encounter for change or removal of nonsurgical wound dressing: Secondary | ICD-10-CM | POA: Diagnosis not present

## 2012-04-25 DIAGNOSIS — Z792 Long term (current) use of antibiotics: Secondary | ICD-10-CM | POA: Diagnosis not present

## 2012-04-25 DIAGNOSIS — I10 Essential (primary) hypertension: Secondary | ICD-10-CM | POA: Diagnosis not present

## 2012-04-26 DIAGNOSIS — I83219 Varicose veins of right lower extremity with both ulcer of unspecified site and inflammation: Secondary | ICD-10-CM | POA: Diagnosis not present

## 2012-04-26 DIAGNOSIS — I10 Essential (primary) hypertension: Secondary | ICD-10-CM | POA: Diagnosis not present

## 2012-04-26 DIAGNOSIS — I4891 Unspecified atrial fibrillation: Secondary | ICD-10-CM | POA: Diagnosis not present

## 2012-04-26 DIAGNOSIS — Z4800 Encounter for change or removal of nonsurgical wound dressing: Secondary | ICD-10-CM | POA: Diagnosis not present

## 2012-04-26 DIAGNOSIS — Z792 Long term (current) use of antibiotics: Secondary | ICD-10-CM | POA: Diagnosis not present

## 2012-04-29 DIAGNOSIS — Z4800 Encounter for change or removal of nonsurgical wound dressing: Secondary | ICD-10-CM | POA: Diagnosis not present

## 2012-04-29 DIAGNOSIS — I4891 Unspecified atrial fibrillation: Secondary | ICD-10-CM | POA: Diagnosis not present

## 2012-04-29 DIAGNOSIS — I83229 Varicose veins of left lower extremity with both ulcer of unspecified site and inflammation: Secondary | ICD-10-CM | POA: Diagnosis not present

## 2012-04-29 DIAGNOSIS — I10 Essential (primary) hypertension: Secondary | ICD-10-CM | POA: Diagnosis not present

## 2012-04-29 DIAGNOSIS — Z792 Long term (current) use of antibiotics: Secondary | ICD-10-CM | POA: Diagnosis not present

## 2012-05-03 DIAGNOSIS — I4891 Unspecified atrial fibrillation: Secondary | ICD-10-CM | POA: Diagnosis not present

## 2012-05-03 DIAGNOSIS — I10 Essential (primary) hypertension: Secondary | ICD-10-CM | POA: Diagnosis not present

## 2012-05-03 DIAGNOSIS — Z4800 Encounter for change or removal of nonsurgical wound dressing: Secondary | ICD-10-CM | POA: Diagnosis not present

## 2012-05-03 DIAGNOSIS — L97929 Non-pressure chronic ulcer of unspecified part of left lower leg with unspecified severity: Secondary | ICD-10-CM | POA: Diagnosis not present

## 2012-05-03 DIAGNOSIS — Z792 Long term (current) use of antibiotics: Secondary | ICD-10-CM | POA: Diagnosis not present

## 2012-05-05 DIAGNOSIS — H31019 Macula scars of posterior pole (postinflammatory) (post-traumatic), unspecified eye: Secondary | ICD-10-CM | POA: Diagnosis not present

## 2012-05-05 DIAGNOSIS — Z961 Presence of intraocular lens: Secondary | ICD-10-CM | POA: Diagnosis not present

## 2012-05-05 DIAGNOSIS — H353290 Exudative age-related macular degeneration, unspecified eye, stage unspecified: Secondary | ICD-10-CM | POA: Diagnosis not present

## 2012-05-06 DIAGNOSIS — I4891 Unspecified atrial fibrillation: Secondary | ICD-10-CM | POA: Diagnosis not present

## 2012-05-06 DIAGNOSIS — I83009 Varicose veins of unspecified lower extremity with ulcer of unspecified site: Secondary | ICD-10-CM | POA: Diagnosis not present

## 2012-05-06 DIAGNOSIS — L97909 Non-pressure chronic ulcer of unspecified part of unspecified lower leg with unspecified severity: Secondary | ICD-10-CM | POA: Diagnosis not present

## 2012-05-06 DIAGNOSIS — Z23 Encounter for immunization: Secondary | ICD-10-CM | POA: Diagnosis not present

## 2012-05-06 DIAGNOSIS — D508 Other iron deficiency anemias: Secondary | ICD-10-CM | POA: Diagnosis not present

## 2012-05-10 DIAGNOSIS — I4891 Unspecified atrial fibrillation: Secondary | ICD-10-CM | POA: Diagnosis not present

## 2012-05-10 DIAGNOSIS — Z4800 Encounter for change or removal of nonsurgical wound dressing: Secondary | ICD-10-CM | POA: Diagnosis not present

## 2012-05-10 DIAGNOSIS — I10 Essential (primary) hypertension: Secondary | ICD-10-CM | POA: Diagnosis not present

## 2012-05-10 DIAGNOSIS — Z792 Long term (current) use of antibiotics: Secondary | ICD-10-CM | POA: Diagnosis not present

## 2012-05-10 DIAGNOSIS — L97929 Non-pressure chronic ulcer of unspecified part of left lower leg with unspecified severity: Secondary | ICD-10-CM | POA: Diagnosis not present

## 2012-05-13 DIAGNOSIS — I83219 Varicose veins of right lower extremity with both ulcer of unspecified site and inflammation: Secondary | ICD-10-CM | POA: Diagnosis not present

## 2012-05-13 DIAGNOSIS — Z4800 Encounter for change or removal of nonsurgical wound dressing: Secondary | ICD-10-CM | POA: Diagnosis not present

## 2012-05-13 DIAGNOSIS — I10 Essential (primary) hypertension: Secondary | ICD-10-CM | POA: Diagnosis not present

## 2012-05-13 DIAGNOSIS — I4891 Unspecified atrial fibrillation: Secondary | ICD-10-CM | POA: Diagnosis not present

## 2012-05-13 DIAGNOSIS — Z792 Long term (current) use of antibiotics: Secondary | ICD-10-CM | POA: Diagnosis not present

## 2012-05-17 DIAGNOSIS — I83219 Varicose veins of right lower extremity with both ulcer of unspecified site and inflammation: Secondary | ICD-10-CM | POA: Diagnosis not present

## 2012-05-17 DIAGNOSIS — Z4800 Encounter for change or removal of nonsurgical wound dressing: Secondary | ICD-10-CM | POA: Diagnosis not present

## 2012-05-17 DIAGNOSIS — I4891 Unspecified atrial fibrillation: Secondary | ICD-10-CM | POA: Diagnosis not present

## 2012-05-17 DIAGNOSIS — Z792 Long term (current) use of antibiotics: Secondary | ICD-10-CM | POA: Diagnosis not present

## 2012-05-17 DIAGNOSIS — I10 Essential (primary) hypertension: Secondary | ICD-10-CM | POA: Diagnosis not present

## 2012-06-12 ENCOUNTER — Other Ambulatory Visit: Payer: Self-pay | Admitting: Internal Medicine

## 2012-06-23 DIAGNOSIS — H353290 Exudative age-related macular degeneration, unspecified eye, stage unspecified: Secondary | ICD-10-CM | POA: Diagnosis not present

## 2012-07-04 DIAGNOSIS — L821 Other seborrheic keratosis: Secondary | ICD-10-CM | POA: Diagnosis not present

## 2012-07-04 DIAGNOSIS — L820 Inflamed seborrheic keratosis: Secondary | ICD-10-CM | POA: Diagnosis not present

## 2012-07-09 ENCOUNTER — Inpatient Hospital Stay (HOSPITAL_COMMUNITY)
Admission: EM | Admit: 2012-07-09 | Discharge: 2012-07-13 | DRG: 603 | Disposition: A | Discharge status: Home Health | Payer: Medicare Other | Attending: Internal Medicine | Admitting: Internal Medicine

## 2012-07-09 ENCOUNTER — Emergency Department (HOSPITAL_COMMUNITY): Payer: Medicare Other

## 2012-07-09 ENCOUNTER — Encounter (HOSPITAL_COMMUNITY): Payer: Self-pay | Admitting: *Deleted

## 2012-07-09 DIAGNOSIS — E871 Hypo-osmolality and hyponatremia: Secondary | ICD-10-CM

## 2012-07-09 DIAGNOSIS — J45909 Unspecified asthma, uncomplicated: Secondary | ICD-10-CM

## 2012-07-09 DIAGNOSIS — Z96649 Presence of unspecified artificial hip joint: Secondary | ICD-10-CM | POA: Diagnosis not present

## 2012-07-09 DIAGNOSIS — I4891 Unspecified atrial fibrillation: Secondary | ICD-10-CM | POA: Diagnosis not present

## 2012-07-09 DIAGNOSIS — Z860100 Personal history of colon polyps, unspecified: Secondary | ICD-10-CM

## 2012-07-09 DIAGNOSIS — I10 Essential (primary) hypertension: Secondary | ICD-10-CM | POA: Diagnosis present

## 2012-07-09 DIAGNOSIS — E785 Hyperlipidemia, unspecified: Secondary | ICD-10-CM | POA: Diagnosis not present

## 2012-07-09 DIAGNOSIS — IMO0002 Reserved for concepts with insufficient information to code with codable children: Secondary | ICD-10-CM | POA: Diagnosis not present

## 2012-07-09 DIAGNOSIS — L03119 Cellulitis of unspecified part of limb: Secondary | ICD-10-CM | POA: Diagnosis not present

## 2012-07-09 DIAGNOSIS — L03116 Cellulitis of left lower limb: Secondary | ICD-10-CM

## 2012-07-09 DIAGNOSIS — I872 Venous insufficiency (chronic) (peripheral): Secondary | ICD-10-CM | POA: Diagnosis not present

## 2012-07-09 DIAGNOSIS — D509 Iron deficiency anemia, unspecified: Secondary | ICD-10-CM

## 2012-07-09 DIAGNOSIS — D649 Anemia, unspecified: Secondary | ICD-10-CM

## 2012-07-09 DIAGNOSIS — Z66 Do not resuscitate: Secondary | ICD-10-CM | POA: Diagnosis present

## 2012-07-09 DIAGNOSIS — N289 Disorder of kidney and ureter, unspecified: Secondary | ICD-10-CM | POA: Diagnosis not present

## 2012-07-09 DIAGNOSIS — R059 Cough, unspecified: Secondary | ICD-10-CM

## 2012-07-09 DIAGNOSIS — Z87891 Personal history of nicotine dependence: Secondary | ICD-10-CM

## 2012-07-09 DIAGNOSIS — R609 Edema, unspecified: Secondary | ICD-10-CM | POA: Diagnosis not present

## 2012-07-09 DIAGNOSIS — J441 Chronic obstructive pulmonary disease with (acute) exacerbation: Secondary | ICD-10-CM | POA: Diagnosis not present

## 2012-07-09 DIAGNOSIS — L0291 Cutaneous abscess, unspecified: Secondary | ICD-10-CM | POA: Diagnosis not present

## 2012-07-09 DIAGNOSIS — Z96659 Presence of unspecified artificial knee joint: Secondary | ICD-10-CM | POA: Diagnosis not present

## 2012-07-09 DIAGNOSIS — Z79899 Other long term (current) drug therapy: Secondary | ICD-10-CM

## 2012-07-09 DIAGNOSIS — R05 Cough: Secondary | ICD-10-CM

## 2012-07-09 DIAGNOSIS — L03115 Cellulitis of right lower limb: Secondary | ICD-10-CM

## 2012-07-09 DIAGNOSIS — I890 Lymphedema, not elsewhere classified: Secondary | ICD-10-CM | POA: Diagnosis present

## 2012-07-09 DIAGNOSIS — R509 Fever, unspecified: Secondary | ICD-10-CM | POA: Diagnosis not present

## 2012-07-09 DIAGNOSIS — L02419 Cutaneous abscess of limb, unspecified: Secondary | ICD-10-CM | POA: Diagnosis not present

## 2012-07-09 DIAGNOSIS — R600 Localized edema: Secondary | ICD-10-CM

## 2012-07-09 LAB — CBC WITH DIFFERENTIAL/PLATELET
Basophils Absolute: 0 10*3/uL (ref 0.0–0.1)
Basophils Relative: 1 % (ref 0–1)
Eosinophils Absolute: 0.1 10*3/uL (ref 0.0–0.7)
Eosinophils Relative: 2 % (ref 0–5)
HCT: 38 % (ref 36.0–46.0)
Hemoglobin: 11.5 g/dL — ABNORMAL LOW (ref 12.0–15.0)
Lymphocytes Relative: 8 % — ABNORMAL LOW (ref 12–46)
Lymphs Abs: 0.7 10*3/uL (ref 0.7–4.0)
MCH: 19.7 pg — ABNORMAL LOW (ref 26.0–34.0)
MCHC: 30.3 g/dL (ref 30.0–36.0)
MCV: 65.1 fL — ABNORMAL LOW (ref 78.0–100.0)
Monocytes Absolute: 0.9 10*3/uL (ref 0.1–1.0)
Monocytes Relative: 10 % (ref 3–12)
Neutro Abs: 7 10*3/uL (ref 1.7–7.7)
Neutrophils Relative %: 79 % — ABNORMAL HIGH (ref 43–77)
Platelets: 142 10*3/uL — ABNORMAL LOW (ref 150–400)
RBC: 5.84 MIL/uL — ABNORMAL HIGH (ref 3.87–5.11)
RDW: 15.5 % (ref 11.5–15.5)
WBC: 8.9 10*3/uL (ref 4.0–10.5)

## 2012-07-09 LAB — BASIC METABOLIC PANEL
BUN: 22 mg/dL (ref 6–23)
CO2: 29 mEq/L (ref 19–32)
Calcium: 9.6 mg/dL (ref 8.4–10.5)
Chloride: 91 mEq/L — ABNORMAL LOW (ref 96–112)
Creatinine, Ser: 1.44 mg/dL — ABNORMAL HIGH (ref 0.50–1.10)
GFR calc Af Amer: 36 mL/min — ABNORMAL LOW (ref >90)
GFR calc non Af Amer: 31 mL/min — ABNORMAL LOW (ref >90)
Glucose, Bld: 107 mg/dL — ABNORMAL HIGH (ref 70–99)
Potassium: 4.8 mEq/L (ref 3.5–5.1)
Sodium: 129 mEq/L — ABNORMAL LOW (ref 135–145)

## 2012-07-09 LAB — CBC
HCT: 28.7 % — ABNORMAL LOW (ref 36.0–46.0)
Hemoglobin: 9.1 g/dL — ABNORMAL LOW (ref 12.0–15.0)
MCV: 64.9 fL — ABNORMAL LOW (ref 78.0–100.0)
RBC: 4.42 MIL/uL (ref 3.87–5.11)
WBC: 11.4 10*3/uL — ABNORMAL HIGH (ref 4.0–10.5)

## 2012-07-09 LAB — CREATININE, SERUM
GFR calc Af Amer: 33 mL/min — ABNORMAL LOW (ref >90)
GFR calc non Af Amer: 29 mL/min — ABNORMAL LOW (ref >90)

## 2012-07-09 MED ORDER — SODIUM CHLORIDE 0.9 % IV SOLN
1.5000 g | Freq: Four times a day (QID) | INTRAVENOUS | Status: DC
  Administered 2012-07-09 – 2012-07-11 (×7): 1.5 g via INTRAVENOUS
  Filled 2012-07-09 (×10): qty 1.5

## 2012-07-09 MED ORDER — ACETAMINOPHEN 325 MG PO TABS: 650.0000 mg | ORAL_TABLET | Freq: Four times a day (QID) | ORAL | Status: DC | PRN

## 2012-07-09 MED ORDER — ALBUTEROL SULFATE (5 MG/ML) 0.5% IN NEBU
2.5000 mg | INHALATION_SOLUTION | Freq: Four times a day (QID) | RESPIRATORY_TRACT | Status: DC | PRN
  Administered 2012-07-11 – 2012-07-13 (×5): 2.5 mg via RESPIRATORY_TRACT
  Filled 2012-07-09 (×5): qty 0.5

## 2012-07-09 MED ORDER — ALBUTEROL SULFATE (5 MG/ML) 0.5% IN NEBU
5.0000 mg | INHALATION_SOLUTION | Freq: Once | RESPIRATORY_TRACT | Status: AC
  Administered 2012-07-09: 5 mg via RESPIRATORY_TRACT
  Filled 2012-07-09: qty 1

## 2012-07-09 MED ORDER — ALIGN 4 MG PO CAPS: 1.0000 [IU] | ORAL_CAPSULE | Freq: Every morning | ORAL | Status: DC

## 2012-07-09 MED ORDER — ACETAMINOPHEN 325 MG PO TABS
650.0000 mg | ORAL_TABLET | Freq: Once | ORAL | Status: AC
  Administered 2012-07-09: 650 mg via ORAL
  Filled 2012-07-09: qty 2

## 2012-07-09 MED ORDER — ACETAMINOPHEN 650 MG RE SUPP: 650.0000 mg | Freq: Four times a day (QID) | RECTAL | Status: DC | PRN

## 2012-07-09 MED ORDER — SODIUM CHLORIDE 0.9 % IV BOLUS (SEPSIS)
1000.0000 mL | Freq: Once | INTRAVENOUS | Status: AC
  Administered 2012-07-09: 1000 mL via INTRAVENOUS

## 2012-07-09 MED ORDER — VERAPAMIL HCL ER 120 MG PO TBCR
120.0000 mg | EXTENDED_RELEASE_TABLET | Freq: Two times a day (BID) | ORAL | Status: DC
  Administered 2012-07-09 – 2012-07-10 (×3): 120 mg via ORAL
  Filled 2012-07-09 (×5): qty 1

## 2012-07-09 MED ORDER — SACCHAROMYCES BOULARDII 250 MG PO CAPS
250.0000 mg | ORAL_CAPSULE | Freq: Every day | ORAL | Status: DC
  Administered 2012-07-10 – 2012-07-12 (×3): 250 mg via ORAL
  Filled 2012-07-09 (×4): qty 1

## 2012-07-09 MED ORDER — VANCOMYCIN HCL IN DEXTROSE 1-5 GM/200ML-% IV SOLN
1000.0000 mg | INTRAVENOUS | Status: DC
  Administered 2012-07-10: 1000 mg via INTRAVENOUS
  Filled 2012-07-09 (×2): qty 200

## 2012-07-09 MED ORDER — METOPROLOL TARTRATE 25 MG PO TABS
25.0000 mg | ORAL_TABLET | Freq: Two times a day (BID) | ORAL | Status: DC
  Administered 2012-07-09 – 2012-07-12 (×7): 25 mg via ORAL
  Filled 2012-07-09 (×11): qty 1

## 2012-07-09 MED ORDER — VERAPAMIL HCL ER 120 MG PO CP24: 120.0000 mg | ORAL_CAPSULE | Freq: Two times a day (BID) | ORAL | Status: DC

## 2012-07-09 MED ORDER — METHYLPREDNISOLONE SODIUM SUCC 125 MG IJ SOLR
80.0000 mg | Freq: Four times a day (QID) | INTRAMUSCULAR | Status: DC
  Administered 2012-07-09 – 2012-07-13 (×14): 80 mg via INTRAVENOUS
  Filled 2012-07-09 (×19): qty 1.28

## 2012-07-09 MED ORDER — PANTOPRAZOLE SODIUM 40 MG PO TBEC
40.0000 mg | DELAYED_RELEASE_TABLET | Freq: Every day | ORAL | Status: DC
  Administered 2012-07-10 – 2012-07-12 (×3): 40 mg via ORAL
  Filled 2012-07-09 (×3): qty 1

## 2012-07-09 MED ORDER — ENOXAPARIN SODIUM 40 MG/0.4ML ~~LOC~~ SOLN
40.0000 mg | SUBCUTANEOUS | Status: DC
  Administered 2012-07-09: 40 mg via SUBCUTANEOUS
  Filled 2012-07-09 (×2): qty 0.4

## 2012-07-09 MED ORDER — IPRATROPIUM BROMIDE 0.02 % IN SOLN
0.5000 mg | Freq: Once | RESPIRATORY_TRACT | Status: AC
Start: 2012-07-09 — End: 2012-07-09
  Administered 2012-07-09: 0.5 mg via RESPIRATORY_TRACT
  Filled 2012-07-09: qty 2.5

## 2012-07-09 MED ORDER — VANCOMYCIN HCL IN DEXTROSE 1-5 GM/200ML-% IV SOLN
1000.0000 mg | Freq: Once | INTRAVENOUS | Status: AC
  Administered 2012-07-09: 1000 mg via INTRAVENOUS
  Filled 2012-07-09: qty 200

## 2012-07-09 MED ORDER — BUDESONIDE-FORMOTEROL FUMARATE 160-4.5 MCG/ACT IN AERO
2.0000 | INHALATION_SPRAY | Freq: Two times a day (BID) | RESPIRATORY_TRACT | Status: DC
  Administered 2012-07-10 – 2012-07-13 (×6): 2 via RESPIRATORY_TRACT
  Filled 2012-07-09: qty 6

## 2012-07-09 NOTE — ED Notes (Signed)
Pt attempted to give urine sample but was unsuccessful.

## 2012-07-09 NOTE — Progress Notes (Signed)
ANTIBIOTIC CONSULT NOTE - INITIAL  Pharmacy Consult for vancomycin Indication: recurrent cellulitis  Allergies  Allergen Reactions  . Dust Mite Extract     Unknown  . Latex     Unknown  . Mold Extract (Trichophyton Mentagrophyte)     Unknown   Vital Signs: Temp: 99.2 F (37.3 C) (01/04 2042) Temp src: Oral (01/04 2042) BP: 95/49 mmHg (01/04 2042) Pulse Rate: 69  (01/04 2042) Intake/Output from previous day:   Intake/Output from this shift:    Labs:  Basename 07/09/12 1704  WBC 8.9  HGB 11.5*  PLT 142*  LABCREA --  CREATININE 1.44*   The CrCl is unknown because both a height and weight (above a minimum accepted value) are required for this calculation. No results found for this basename: VANCOTROUGH:2,VANCOPEAK:2,VANCORANDOM:2,GENTTROUGH:2,GENTPEAK:2,GENTRANDOM:2,TOBRATROUGH:2,TOBRAPEAK:2,TOBRARND:2,AMIKACINPEAK:2,AMIKACINTROU:2,AMIKACIN:2, in the last 72 hours   Microbiology: No results found for this or any previous visit (from the past 720 hour(s)).  Medical History: Past Medical History  Diagnosis Date  . Chronic venous insufficiency   . Cellulitis   . Chronic acquired lymphedema   . Asthma   . Hyperlipidemia   . Hypertension   . Dysrhythmia   . Anemia     Medications:  Anti-infectives     Start     Dose/Rate Route Frequency Ordered Stop   07/09/12 2045   ampicillin-sulbactam (UNASYN) 1.5 g in sodium chloride 0.9 % 50 mL IVPB        1.5 g 100 mL/hr over 30 Minutes Intravenous Every 6 hours 07/09/12 2038     07/09/12 1745   vancomycin (VANCOCIN) IVPB 1000 mg/200 mL premix        1,000 mg 200 mL/hr over 60 Minutes Intravenous  Once 07/09/12 1739 07/09/12 1949         Assessment: 59 yof presented to the ED with recurrent cellulitis. She is febrile and a WBC WNL. Also she has poor renal fxn. She will also be started on unasyn and has received a dose of vancomycin 1gm in the ED so far.   Goal of Therapy:  Vancomycin trough level 10-15  mcg/ml  Plan:  1. Vancomycin 1gm IV Q24H 2. F/u renal fxn, C&S, clinical status and trough at [facility], Rande Lawman 07/09/2012,9:05 PM

## 2012-07-09 NOTE — ED Notes (Signed)
Daughter contact info:   Rankin [phone]

## 2012-07-09 NOTE — ED Provider Notes (Signed)
History   This chart was scribed for Virgel Manifold, MD by Kathreen Cornfield, ED Scribe. The patient was seen in room TR06C/TR06C and the patient's care was started at 5:14PM.     CSN: [id number]  Arrival date & time 07/09/12  1653   First MD Initiated Contact with Patient 07/09/12 1714      Chief Complaint  Patient presents with  . Cellulitis    (Consider location/radiation/quality/duration/timing/severity/associated sxs/prior treatment) Patient is a 79 y.o. female presenting with rash and fever. The history is provided by the patient. No language interpreter was used.  Rash  This is a recurrent problem. The current episode started more than 2 days ago (4 days ago). The problem has been gradually worsening. The problem is associated with an unknown factor. The maximum temperature recorded prior to her arrival was 102 to 102.9 F. The fever has been present for 1 to 2 days. The rash is present on the right lower leg and left lower leg. The pain is mild. The pain has been constant since onset. Associated symptoms comments: Swelling located at the bilateral lower extremities. . She has tried nothing for the symptoms. The treatment provided no relief.  Fever Primary symptoms of the febrile illness include fever and rash. The current episode started yesterday. This is a new problem. The problem has been gradually worsening.  The fever began yesterday. The fever has been gradually worsening since its onset. The maximum temperature recorded prior to her arrival was 102 to 102.9 F. The temperature was taken by an oral thermometer.  The rash began 2 to 7 days ago (4 days ago). The rash appears on the right leg and left leg. The pain associated with the rash is mild.    Lori [name] is a 79 y.o. female , with a hx of cellulitis, who presents to the Emergency Department complaining of  gradual, progressively worsening, cellulitis, located at the bilateral lower extremities, onset four days ago (07/05/12).   Associated symptoms include fever (102.2, taken at [facility] today, 07/09/12), nausea, abdominal pain, chills, and sweats.  The pt denies vomiting.  The pt does not smoke or drink alcohol.   PCP is Dr. Philip Aspen.    Past Medical History  Diagnosis Date  . Chronic venous insufficiency   . Cellulitis   . Chronic acquired lymphedema   . Asthma   . Hyperlipidemia   . Hypertension   . Dysrhythmia   . Anemia     Past Surgical History  Procedure Date  . Left total knee replacement   . Right total hip replacement   . Joint replacement     Family History  Problem Relation Age of Onset  . Other Father     unknown causes  . Lung cancer Mother   . Breast cancer Neg Hx   . Diabetes Neg Hx     History  Substance Use Topics  . Smoking status: Former Smoker    Quit date: 07/28/1959  . Smokeless tobacco: Not on file  . Alcohol Use: No    OB History    Grav Para Term Preterm Abortions TAB SAB Ect Mult Living                  Review of Systems  Constitutional: Positive for fever.  All other systems reviewed and are negative.    Allergies  Dust mite extract; Latex; and Mold extract  Home Medications   Current Outpatient Rx  Name  Route  Sig  Dispense  Refill  . ALBUTEROL SULFATE (2.5 MG/3ML) 0.083% IN NEBU   Nebulization   Take 2.5 mg by nebulization 4 (four) times daily.         . OCUVITE PO TABS   Oral   Take 1 tablet by mouth 2 (two) times daily.          . BUDESONIDE-FORMOTEROL FUMARATE 160-4.5 MCG/ACT IN AERO   Inhalation   Inhale 2 puffs into the lungs 2 (two) times daily.         Marland Kitchen CICLESONIDE 50 MCG/ACT NA SUSP   Each Nare   Place 1 spray into both nostrils daily.         Marland Kitchen FOLIC ACID Q000111Q MCG PO TABS   Oral   Take 800 mcg by mouth every morning.          . FUROSEMIDE 20 MG PO TABS   Oral   Take 2 tablets (40 mg total) by mouth daily.   60 tablet   12   . METOPROLOL TARTRATE 25 MG PO TABS   Oral   Take 1 tablet (25 mg total) by mouth 2  (two) times daily.   60 tablet   12   . ADULT MULTIVITAMIN W/MINERALS CH   Oral   Take 1 tablet by mouth at bedtime.          Marland Kitchen PATANASE NA   Each Nare   Place 1 spray into both nostrils 2 (two) times daily.         Marland Kitchen OMEPRAZOLE 40 MG PO CPDR   Oral   Take 40 mg by mouth 2 (two) times daily.         Marland Kitchen PRADAXA 75 MG PO CAPS      TAKE ONE CAPSULE BY MOUTH TWICE DAILY TO PREVENT A STROKE   60 capsule   10   . PREDNISONE 10 MG PO TABS   Oral   Take 10-30 mg by mouth See admin instructions.         . ALIGN PO   Oral   Take 1 capsule by mouth every morning.         . SELENIUM 200 MCG PO TABS   Oral   Take 1 tablet by mouth daily.         Marland Kitchen SPIRONOLACTONE 25 MG PO TABS   Oral   Take 1 tablet (25 mg total) by mouth daily.   30 tablet   12   . VERAPAMIL HCL ER 120 MG PO CP24   Oral   Take 1 capsule (120 mg total) by mouth 2 (two) times daily.   60 capsule   12   . VITAMIN C 500 MG PO TABS   Oral   Take 500 mg by mouth 2 (two) times daily.           BP 101/77  Pulse 90  Temp 102.2 F (39 C) (Oral)  Resp 20  SpO2 94%  Physical Exam  Nursing note and vitals reviewed. Constitutional: She appears well-developed and well-nourished. No distress.       Pt is hard of hearing.   HENT:  Head: Normocephalic and atraumatic.  Eyes: Conjunctivae normal are normal. Right eye exhibits no discharge. Left eye exhibits no discharge.  Neck: Neck supple.  Cardiovascular: Normal rate, regular rhythm and normal heart sounds.  Exam reveals no gallop and no friction rub.   No murmur heard. Pulmonary/Chest: Effort normal and breath sounds normal. No respiratory distress.  Abdominal: Soft. She  exhibits no distension. There is no tenderness.  Musculoskeletal: She exhibits no edema and no tenderness.       Right lower leg: She exhibits tenderness.       Left lower leg: She exhibits tenderness.  Neurological: She is alert.  Skin: Skin is warm and dry. There is  erythema.       Erythema, increased warmth and tenderness of the posterior aspect of the distal left leg extending proximally to the proximal shin. Neurovascularly intact distally. Small area of similar skin changes to the distal right shin.   Psychiatric: She has a normal mood and affect. Her behavior is normal. Thought content normal.    ED Course  Procedures (including critical care time)  DIAGNOSTIC STUDIES: Oxygen Saturation is 94% on room air, normal by my interpretation.    COORDINATION OF CARE:  5:34 PM- Treatment plan discussed with patient. Pt agrees with treatment.       Results for orders placed during the hospital encounter of 07/09/12  CBC WITH DIFFERENTIAL      Component Value Range   WBC 8.9  4.0 - 10.5 K/uL   RBC 5.84 (*) 3.87 - 5.11 MIL/uL   Hemoglobin 11.5 (*) 12.0 - 15.0 g/dL   HCT 38.0  36.0 - 46.0 %   MCV 65.1 (*) 78.0 - 100.0 fL   MCH 19.7 (*) 26.0 - 34.0 pg   MCHC 30.3  30.0 - 36.0 g/dL   RDW 15.5  11.5 - 15.5 %   Platelets 142 (*) 150 - 400 K/uL   Neutrophils Relative 79 (*) 43 - 77 %   Neutro Abs 7.0  1.7 - 7.7 K/uL   Lymphocytes Relative 8 (*) 12 - 46 %   Lymphs Abs 0.7  0.7 - 4.0 K/uL   Monocytes Relative 10  3 - 12 %   Monocytes Absolute 0.9  0.1 - 1.0 K/uL   Eosinophils Relative 2  0 - 5 %   Eosinophils Absolute 0.1  0.0 - 0.7 K/uL   Basophils Relative 1  0 - 1 %   Basophils Absolute 0.0  0.0 - 0.1 K/uL  BASIC METABOLIC PANEL      Component Value Range   Sodium 129 (*) 135 - 145 mEq/L   Potassium 4.8  3.5 - 5.1 mEq/L   Chloride 91 (*) 96 - 112 mEq/L   CO2 29  19 - 32 mEq/L   Glucose, Bld 107 (*) 70 - 99 mg/dL   BUN 22  6 - 23 mg/dL   Creatinine, Ser 1.44 (*) 0.50 - 1.10 mg/dL   Calcium 9.6  8.4 - 10.5 mg/dL   GFR calc non Af Amer 31 (*) >90 mL/min   GFR calc Af Amer 36 (*) >90 mL/min   Dg Chest 2 View  07/09/2012  *RADIOLOGY REPORT*  Clinical Data: Fever and cough  CHEST - 2 VIEW  Comparison: 08/11/2010  Findings: Cardiac  enlargement.  Prominent lung markings are unchanged and compatible with COPD and chronic bronchitis.  No acute infiltrate or effusion.  Negative for heart failure.  IMPRESSION: Chronic lung disease.  No acute abnormality.   Original Report Authenticated By: Carl Best, M.D.       1. Cellulitis of leg, left   2. Cellulitis of right leg   3. Hyponatremia   4. Renal insufficiency   5. URI    MDM  79 year old female with cellulitis of the lower extremities. History of same. Patient is febrile, but she is clinically well appearing.  Patient with recent URI symptoms as well, but her chest x-ray is clear and in no respiratory distress. Presume source of fever is cellulitis, but given respiratory symptoms flu pcr sent. Vanc ordered.      I personally preformed the services scribed in my presence. The recorded information has been reviewed is accurate. Virgel Manifold, MD.    Virgel Manifold, MD 07/09/12 2001

## 2012-07-09 NOTE — ED Notes (Signed)
Back of left and front of right leg cellulitis.  Feverish. Hx. Cellulitis.  Hot to touch in both areas.

## 2012-07-09 NOTE — H&P (Signed)
PCP:   Donnajean Lopes, MD   Chief Complaint:  Recurrent cellulits  HPI: 79 YO WF with hx of many issues with leg swelling and recurrent infections. Now having issues for several days with no call until midday today. Abx called in and one dose given, to ER within one hour after that with fever. Has predisposing factors of lymphedema, venous insuffiency and chronic steroid use as well as advanced age and debility. BS's typically have been OK per the records. She reports no PO intake until this evening today. She slept well last night without chills or sweats. Some non-productive cough. Bowels OK. No wheeze, generally weak. Some diffuse aches, some cold intolerance.  Talked with Gdtr and confirmed DNR  Review of Systems:  Review of Systems - Negative except as above Past Medical History: Past Medical History  Diagnosis Date  . Chronic venous insufficiency   . Cellulitis   . Chronic acquired lymphedema   . Asthma   . Hyperlipidemia   . Hypertension   . Dysrhythmia   . Anemia   Past Medical History (reviewed - no changes required): Hypertension, essential Hyperlipidemia Bilateral Chronic Venous Insufficiency with varicose veins Recurrent Stasis Dermatitis versus cellulitis of legs (left more than right) Bilateral Sensorineural Hearing Loss (hearing aides in both ears) Bilateral Cerumen Impactions 2011 anemia with iron deficiency and thalassemia trait 12/12 dyspnea with EKG showing atrial fibrillation  Physicians involved in care:  Rafaella Aguilla (former PCP, [facility]), Clifford (ortho), Scarlette Shorts (GI)     Past Surgical History  Procedure Date  . Left total knee replacement   . Right total hip replacement   . Joint replacement   Surgical History (reviewed - no changes required): Right Total Knee Replacement Left Total Knee Replacement Colonoscopy in the past, declined repeat in 2010   Medications: Prior to Admission medications   Medication Sig Start Date End  Date Taking? Authorizing Provider  albuterol (PROVENTIL) (2.5 MG/3ML) 0.083% nebulizer solution Take 2.5 mg by nebulization 4 (four) times daily.   Yes Historical Provider, MD  beta carotene w/minerals (OCUVITE) tablet Take 1 tablet by mouth 2 (two) times daily.    Yes Historical Provider, MD  budesonide-formoterol (SYMBICORT) 160-4.5 MCG/ACT inhaler Inhale 2 puffs into the lungs 2 (two) times daily.   Yes Historical Provider, MD  ciclesonide (OMNARIS) 50 MCG/ACT nasal spray Place 1 spray into both nostrils daily.   Yes Historical Provider, MD  folic acid (FOLVITE) Q000111Q MCG tablet Take 800 mcg by mouth every morning.    Yes Historical Provider, MD  furosemide (LASIX) 20 MG tablet Take 2 tablets (40 mg total) by mouth daily. 03/31/12  Yes Evans Lance, MD  metoprolol tartrate (LOPRESSOR) 25 MG tablet Take 1 tablet (25 mg total) by mouth 2 (two) times daily. 03/31/12  Yes Evans Lance, MD  Multiple Vitamin (MULITIVITAMIN WITH MINERALS) TABS Take 1 tablet by mouth at bedtime.    Yes Historical Provider, MD  Olopatadine HCl (PATANASE NA) Place 1 spray into both nostrils 2 (two) times daily.   Yes Historical Provider, MD  omeprazole (PRILOSEC) 40 MG capsule Take 40 mg by mouth 2 (two) times daily.   Yes Historical Provider, MD  PRADAXA 75 MG CAPS TAKE ONE CAPSULE BY MOUTH TWICE DAILY TO PREVENT A STROKE 06/12/12  Yes Evans Lance, MD  predniSONE (DELTASONE) 10 MG tablet Take 10-30 mg by mouth See admin instructions.   Yes Historical Provider, MD  Probiotic Product (ALIGN PO) Take 1 capsule by mouth every morning.  Yes Historical Provider, MD  Selenium 200 MCG TABS Take 1 tablet by mouth daily.   Yes Historical Provider, MD  spironolactone (ALDACTONE) 25 MG tablet Take 1 tablet (25 mg total) by mouth daily. 03/31/12 03/31/13 Yes Evans Lance, MD  verapamil (VERELAN PM) 120 MG 24 hr capsule Take 1 capsule (120 mg total) by mouth 2 (two) times daily. 03/31/12  Yes Evans Lance, MD  vitamin C (ASCORBIC  ACID) 500 MG tablet Take 500 mg by mouth 2 (two) times daily.   Yes Historical Provider, MD    Allergies:   Allergies  Allergen Reactions  . Dust Mite Extract     Unknown  . Latex     Unknown  . Mold Extract (Trichophyton Mentagrophyte)     Unknown    Social History:  reports that she quit smoking about 52 years ago. She does not have any smokeless tobacco history on file. She reports that she does not drink alcohol or use illicit drugs.  Social History (reviewed - no changes required): She has been a widow since her husband died on [id number] from complications of diabetes. She has 1 son, Byanca Youngblood, age 30, who lives in [place] (tel not provided). She has 2 grandchildren in [place]. Her friend, and point of contact, is Danielle Rankin, at telephone numbers [phone] and [phone] (cell). 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 She is a friend of Danielle Rankin, another [facility] patient. Family History: Family History  Problem Relation Age of Onset  . Other Father     unknown causes  . Lung cancer Mother   . Breast cancer Neg Hx   . Diabetes Neg Hx   Family History (reviewed - no changes required): Father died in his 57's of ? Mother died at 8 from lung cancer All of her siblings have died There is no family history of breast or colon cancer, or diabetes mellitus  Physical Exam: Filed Vitals:   07/09/12 1657  BP: 101/77  Pulse: 90  Temp: 102.2 F (39 C)  TempSrc: Oral  Resp: 20  SpO2: 94%   General appearance: fatigued but non toxic, sitting up in no distress. Face symmetric, oral membranes moist  Neck: supple no adenopathy, no carotid bruit, no JVD and thyroid not enlarged, symmetric, no tenderness/mass/nodules Resp: diminished breath sounds bilaterally but no wheeze, no accessory ms in use. No consolidation Cardio: regular rate and rhythm but with freq skips, soft murmur GI: soft, non-tender; bowel sounds  normal; no masses,  no organomegaly Extremities:2+ bilat edema, redness bilat, more posteriorly and a bit more pronounced on the left compared with the right, no redness above mid-calf. No cords felt. Pulses fairly good, fungal nails Lymph nodes:  No inguinal nodes Neurologic: Alert and oriented X 3, speech is clear and fluent. good strength and tone. mentating well. Sl HOH. No tremor.    Labs on Admission:   [facility] 07/09/12 1704  NA 129*  K 4.8  CL 91*  CO2 29  GLUCOSE 107*  BUN 22  CREATININE 1.44*  CALCIUM 9.6  MG --  PHOS --     Basename 07/09/12 1704  WBC 8.9  NEUTROABS 7.0  HGB 11.5*  HCT 38.0  MCV 65.1*  PLT 142*        Radiological Exams on Admission: Dg Chest 2 View  07/09/2012  *RADIOLOGY REPORT*  Clinical Data: Fever and cough  CHEST - 2 VIEW  Comparison: 08/11/2010  Findings: Cardiac enlargement.  Prominent lung markings are unchanged and compatible with COPD and chronic bronchitis.  No acute infiltrate or effusion.  Negative for heart failure.  IMPRESSION: Chronic lung disease.  No acute abnormality.   Original Report Authenticated By: Carl Best, M.D.    Orders placed in visit on 03/31/12  . EKG 12-LEAD    Assessment/Plan Principal Problem:  *Cellulitis of leg: clearly this has been going on for several days. Pt is febrile and toxic. Needs broad spectrum coverage. Will go with unasyn and Vanco initially  BC's pending. BP is still OK Active Problems:  ANEMIA, IRON DEFICIENCY: not far off of baseline but MCV very low at 65, suspect profound iron deficiency  ASTHMA UNSPECIFIED: doing OK, CXR OK  Unspecified essential hypertension: BP OK Hyponatremia: suspect SIADH due to illness and fever and diuretics and relative adrenal insufficiency. Needs IV coverage Steroid dependence: Rx solumedrol Renal Insuffic: getting some fluids Hypertension: Continue Rx Atrial Fibrillation: Rate is reasonable. Continue Verapamil. Hold lasix and Aldactone CODE STATUS:  DNR    Sheela Stack 07/09/2012, 7:57 PM

## 2012-07-10 LAB — CBC
HCT: 32.2 % — ABNORMAL LOW (ref 36.0–46.0)
Hemoglobin: 9.9 g/dL — ABNORMAL LOW (ref 12.0–15.0)
MCH: 19.9 pg — ABNORMAL LOW (ref 26.0–34.0)
MCHC: 30.7 g/dL (ref 30.0–36.0)
RDW: 15.4 % (ref 11.5–15.5)

## 2012-07-10 LAB — INFLUENZA PANEL BY PCR (TYPE A & B)
H1N1 flu by pcr: NOT DETECTED
Influenza A By PCR: NEGATIVE
Influenza B By PCR: NEGATIVE

## 2012-07-10 LAB — URINALYSIS, ROUTINE W REFLEX MICROSCOPIC
Bilirubin Urine: NEGATIVE
Glucose, UA: NEGATIVE mg/dL
Ketones, ur: 15 mg/dL — AB
pH: 5 (ref 5.0–8.0)

## 2012-07-10 LAB — BASIC METABOLIC PANEL
BUN: 29 mg/dL — ABNORMAL HIGH (ref 6–23)
Calcium: 8.7 mg/dL (ref 8.4–10.5)
GFR calc non Af Amer: 23 mL/min — ABNORMAL LOW (ref >90)
Glucose, Bld: 168 mg/dL — ABNORMAL HIGH (ref 70–99)
Sodium: 128 mEq/L — ABNORMAL LOW (ref 135–145)

## 2012-07-10 MED ORDER — INSULIN ASPART 100 UNIT/ML ~~LOC~~ SOLN
0.0000 [IU] | Freq: Three times a day (TID) | SUBCUTANEOUS | Status: DC
  Administered 2012-07-10: 3 [IU] via SUBCUTANEOUS
  Administered 2012-07-10 – 2012-07-11 (×4): 2 [IU] via SUBCUTANEOUS
  Administered 2012-07-12: 1 [IU] via SUBCUTANEOUS
  Administered 2012-07-12: 2 [IU] via SUBCUTANEOUS
  Administered 2012-07-12 – 2012-07-13 (×2): 1 [IU] via SUBCUTANEOUS

## 2012-07-10 MED ORDER — ENOXAPARIN SODIUM 30 MG/0.3ML ~~LOC~~ SOLN
30.0000 mg | SUBCUTANEOUS | Status: DC
  Administered 2012-07-10 – 2012-07-12 (×3): 30 mg via SUBCUTANEOUS
  Filled 2012-07-10 (×4): qty 0.3

## 2012-07-10 NOTE — Progress Notes (Signed)
Subjective: Had a good night. No chills or sweats. Bowels worked this AM. No pain. Some cough   Objective: Vital signs in last 24 hours: Temp:  [97.6 F (36.4 C)-102.2 F (39 C)] 97.6 F (36.4 C) (01/05 0500) Pulse Rate:  [63-90] 70  (01/05 0500) Resp:  [18-20] 18  (01/05 0500) BP: (95-106)/(44-77) 106/44 mmHg (01/05 0500) SpO2:  [94 %-97 %] 97 % (01/05 0500)  Intake/Output from previous day: 01/04 0701 - 01/05 0700 In: 1290 [P.O.:240; IV Piggyback:50] Out: -  Intake/Output this shift:    sittiing up in no distress, non toxic. Neck supple. Lungs: no wheeze, ht irreg with murmur. abd soft NT extrems: less redness bilat. Awake and alert, mentating well  Lab Results   Basename 07/10/12 0500 07/09/12 2054  WBC 10.6* 11.4*  RBC 4.98 4.42  HGB 9.9* 9.1*  HCT 32.2* 28.7*  MCV 64.7* 64.9*  MCH 19.9* 20.6*  RDW 15.4 15.3  PLT 165 151    Basename 07/10/12 0500 07/09/12 2054 07/09/12 1704  NA 128* -- 129*  K 4.9 -- 4.8  CL 93* -- 91*  CO2 24 -- 29  GLUCOSE 168* -- 107*  BUN 29* -- 22  CREATININE 1.81* 1.52* --  CALCIUM 8.7 -- 9.6    Studies/Results: Dg Chest 2 View  07/09/2012  *RADIOLOGY REPORT*  Clinical Data: Fever and cough  CHEST - 2 VIEW  Comparison: 08/11/2010  Findings: Cardiac enlargement.  Prominent lung markings are unchanged and compatible with COPD and chronic bronchitis.  No acute infiltrate or effusion.  Negative for heart failure.  IMPRESSION: Chronic lung disease.  No acute abnormality.   Original Report Authenticated By: Carl Best, M.D.     Scheduled Meds:   . ampicillin-sulbactam (UNASYN) IV  1.5 g Intravenous Q6H  . budesonide-formoterol  2 puff Inhalation BID  . enoxaparin (LOVENOX) injection  40 mg Subcutaneous Q24H  . methylPREDNISolone (SOLU-MEDROL) injection  80 mg Intravenous Q6H  . metoprolol tartrate  25 mg Oral BID  . pantoprazole  40 mg Oral Daily  . saccharomyces boulardii  250 mg Oral Daily  . vancomycin  1,000 mg Intravenous Q24H  .  verapamil  120 mg Oral BID   Continuous Infusions:  PRN Meds:acetaminophen, acetaminophen, albuterol  Assessment/Plan:  *Cellulitis of leg: looks better already. Await BC's. No more fever since presentation. WBC 10.6 Active Problems:  ANEMIA, IRON DEFICIENCY:  Pt reports a heriditary cause for micocytosis ASTHMA UNSPECIFIED: doing OK, CXR OK  Unspecified essential hypertension: BP OK  Hyponatremia: about the same Steroid dependence: Rx solumedrol  Renal Insuffic: getting some fluids but numbers aren't much better  Hypertension: Continue Rx  Atrial Fibrillation: Rate is reasonable. Continue Verapamil. Hold lasix and Aldactone  BS;s up some with steroids CODE STATUS: DNR    LOS: 1 day   Lori [name] ALAN 07/10/2012, 9:11 AM

## 2012-07-11 LAB — CBC
Hemoglobin: 10.5 g/dL — ABNORMAL LOW (ref 12.0–15.0)
MCH: 20.3 pg — ABNORMAL LOW (ref 26.0–34.0)
MCV: 62.6 fL — ABNORMAL LOW (ref 78.0–100.0)
RBC: 5.16 MIL/uL — ABNORMAL HIGH (ref 3.87–5.11)

## 2012-07-11 LAB — BASIC METABOLIC PANEL
CO2: 18 mEq/L — ABNORMAL LOW (ref 19–32)
Chloride: 87 mEq/L — ABNORMAL LOW (ref 96–112)
Glucose, Bld: 185 mg/dL — ABNORMAL HIGH (ref 70–99)
Potassium: 4.6 mEq/L (ref 3.5–5.1)
Sodium: 126 mEq/L — ABNORMAL LOW (ref 135–145)

## 2012-07-11 LAB — GLUCOSE, CAPILLARY: Glucose-Capillary: 162 mg/dL — ABNORMAL HIGH (ref 70–99)

## 2012-07-11 MED ORDER — VANCOMYCIN HCL 1000 MG IV SOLR
750.0000 mg | INTRAVENOUS | Status: DC
  Administered 2012-07-11 – 2012-07-12 (×2): 750 mg via INTRAVENOUS
  Filled 2012-07-11 (×3): qty 750

## 2012-07-11 MED ORDER — SODIUM CHLORIDE 0.9 % IV SOLN
1.5000 g | Freq: Two times a day (BID) | INTRAVENOUS | Status: DC
  Administered 2012-07-11: 1.5 g via INTRAVENOUS
  Filled 2012-07-11 (×3): qty 1.5

## 2012-07-11 NOTE — Progress Notes (Signed)
Urinalysis that needs collection was collected by Night Shift RN before me. Does not need to be collected now. Thank you

## 2012-07-11 NOTE — Progress Notes (Signed)
ANTIBIOTIC CONSULT NOTE - FOLLOW UP  Pharmacy Consult for Vancomycin/Unasyn Indication: cellulitis  Allergies  Allergen Reactions  . Dust Mite Extract     Unknown  . Latex     Unknown  . Mold Extract (Trichophyton Mentagrophyte)     Unknown    Patient Measurements:   Weight ~ 85 kg  Vital Signs: Temp: 97.3 F (36.3 C) (01/06 0620) Temp src: Oral (01/06 0620) BP: 111/75 mmHg (01/06 0620) Pulse Rate: 89  (01/06 0949) Intake/Output from previous day: 01/05 0701 - 01/06 0700 In: 1440 [P.O.:1040; IV Piggyback:400] Out: -  Intake/Output from this shift:    Labs:  Basename 07/11/12 0745 07/10/12 0500 07/09/12 2054  WBC 13.7* 10.6* 11.4*  HGB 10.5* 9.9* 9.1*  PLT 167 165 151  LABCREA -- -- --  CREATININE 1.90* 1.81* 1.52*   CrCl ~ 20 ml/min   Microbiology: Recent Results (from the past 720 hour(s))  CULTURE, BLOOD (ROUTINE X 2)     Status: Normal (Preliminary result)   Collection Time   07/09/12  6:06 PM      Component Value Range Status Comment   Specimen Description BLOOD LEFT ARM   Final    Special Requests BOTTLES DRAWN AEROBIC AND ANAEROBIC 10CC   Final    Culture  Setup Time 07/10/2012 01:31   Final    Culture     Final    Value:        BLOOD CULTURE RECEIVED NO GROWTH TO DATE CULTURE WILL BE HELD FOR 5 DAYS BEFORE ISSUING A FINAL NEGATIVE REPORT   Report Status PENDING   Incomplete   CULTURE, BLOOD (ROUTINE X 2)     Status: Normal (Preliminary result)   Collection Time   07/09/12  6:13 PM      Component Value Range Status Comment   Specimen Description BLOOD LEFT HAND   Final    Special Requests BOTTLES DRAWN AEROBIC ONLY [facility]   Final    Culture  Setup Time 07/10/2012 01:31   Final    Culture     Final    Value:        BLOOD CULTURE RECEIVED NO GROWTH TO DATE CULTURE WILL BE HELD FOR 5 DAYS BEFORE ISSUING A FINAL NEGATIVE REPORT   Report Status PENDING   Incomplete     Anti-infectives     Start     Dose/Rate Route Frequency Ordered Stop   07/10/12 1800    vancomycin (VANCOCIN) IVPB 1000 mg/200 mL premix        1,000 mg 200 mL/hr over 60 Minutes Intravenous Every 24 hours 07/09/12 2140     07/09/12 2200   ampicillin-sulbactam (UNASYN) 1.5 g in sodium chloride 0.9 % 50 mL IVPB        1.5 g 100 mL/hr over 30 Minutes Intravenous Every 6 hours 07/09/12 2038     07/09/12 1745   vancomycin (VANCOCIN) IVPB 1000 mg/200 mL premix        1,000 mg 200 mL/hr over 60 Minutes Intravenous  Once 07/09/12 1739 07/09/12 1949          Assessment: 79 yo F admitted 07/09/12 with recurrent cellulitis.  Pt is on day #3 IV antibiotics with Vancomycin and Unasyn.  Currently afebrile and WBC trending down.  Renal function has worsened since admission.  Will adjust antibiotics accordingly.  Goal of Therapy:  Vancomycin trough level 10-15 mcg/ml  Plan:  1. Change Vancomycin to 750 mg IV Q24h 2. Change Unasyn to 1/5gm IV Q12h  3. F/u renal fxn, C&S, clinical status and trough at [employer], Pharm.D., BCPS Clinical Pharmacist Pager [phone] 07/11/2012 1:30 PM

## 2012-07-11 NOTE — Progress Notes (Signed)
UR COMPLETED  

## 2012-07-11 NOTE — Progress Notes (Signed)
Subjective: Having some cough, no dyspnea or fever. Appetite is good and no diarrhea  Objective: Vital signs in last 24 hours: Temp:  [97.3 F (36.3 C)-98.2 F (36.8 C)] 97.3 F (36.3 C) (01/06 0620) Pulse Rate:  [54-73] 73  (01/06 0620) Resp:  [17-18] 18  (01/06 0620) BP: (92-111)/(43-75) 111/75 mmHg (01/06 0620) SpO2:  [92 %-97 %] 96 % (01/06 0620) Weight change:    Intake/Output from previous day: 01/05 0701 - 01/06 0700 In: 1200 [P.O.:800; IV Piggyback:400] Out: -    General appearance: alert, cooperative and no distress Resp: clear to auscultation bilaterally Cardio: irregularly irregular rhythm and with a grade 2/6 SEM Extremities: bilateral 2+ leg edema with distal erythema (left more than right)  Lab Results:  Basename 07/10/12 0500 07/09/12 2054  WBC 10.6* 11.4*  HGB 9.9* 9.1*  HCT 32.2* 28.7*  PLT 165 151   BMET  Basename 07/10/12 0500 07/09/12 2054 07/09/12 1704  NA 128* -- 129*  K 4.9 -- 4.8  CL 93* -- 91*  CO2 24 -- 29  GLUCOSE 168* -- 107*  BUN 29* -- 22  CREATININE 1.81* 1.52* --  CALCIUM 8.7 -- 9.6   CMET CMP     Component Value Date/Time   NA 128* 07/10/2012 0500   K 4.9 07/10/2012 0500   CL 93* 07/10/2012 0500   CO2 24 07/10/2012 0500   GLUCOSE 168* 07/10/2012 0500   BUN 29* 07/10/2012 0500   CREATININE 1.81* 07/10/2012 0500   CALCIUM 8.7 07/10/2012 0500   PROT 6.5 11/11/2011 1357   ALBUMIN 3.5 11/11/2011 1357   AST 30 11/11/2011 1357   ALT 30 11/11/2011 1357   ALKPHOS 103 11/11/2011 1357   BILITOT 0.6 11/11/2011 1357   GFRNONAA 23* 07/10/2012 0500   GFRAA 27* 07/10/2012 0500    CBG (last 3)   Basename 07/11/12 0706 07/10/12 2330 07/10/12 1626  GLUCAP 199* 162* 189*    INR RESULTS:   No results found for this basename: INR, PROTIME     Studies/Results: Dg Chest 2 View  07/09/2012  *RADIOLOGY REPORT*  Clinical Data: Fever and cough  CHEST - 2 VIEW  Comparison: 08/11/2010  Findings: Cardiac enlargement.  Prominent lung markings are unchanged and  compatible with COPD and chronic bronchitis.  No acute infiltrate or effusion.  Negative for heart failure.  IMPRESSION: Chronic lung disease.  No acute abnormality.   Original Report Authenticated By: Carl Best, M.D.     Medications: I have reviewed the patient's current medications.  Assessment/Plan: #1 Cellulitis: improved with antibiotics, and will add air compression hose. #2 Chronic Kidney disease: moderately severe and will try to adjust meds to increase BP and renal perfusion.  LOS: 2 days   Lori [name] G 07/11/2012, 7:48 AM

## 2012-07-11 NOTE — Clinical Documentation Improvement (Signed)
CKD DOCUMENTATION CLARIFICATION QUERY   THIS DOCUMENT IS NOT A PERMANENT PART OF THE MEDICAL RECORD  TO RESPOND TO THE THIS QUERY, FOLLOW THE INSTRUCTIONS BELOW:  1. If needed, update documentation for the patient's encounter via the notes activity.  2. Access this query again and click edit on the In [employer].  3. After updating, or not, click F2 to complete all highlighted (required) fields concerning your review. Select "additional documentation in the medical record" OR "no additional documentation provided".  4. Click Sign note button.  5. The deficiency will fall out of your In Basket *Please let us know if you are not able to complete this workflow by phone or e-mail (listed below).  Please update your documentation within the medical record to reflect your response to this query.                                                                                        07/11/12   Dear Dr. Paterson:/Associates,  In a better effort to capture your patient's severity of illness, reflect appropriate length of stay and utilization of resources, a review of the patient medical record has revealed the following indicators.    Based on your clinical judgment, please clarify and document in a progress note and/or discharge summary the clinical condition associated with the following supporting information:  In responding to this query please exercise your independent judgment.  The fact that a query is asked, does not imply that any particular answer is desired or expected.  Possible Clinical Conditions?   CKD Stage I -  GFR > OR = 90 CKD Stage II - GFR 60-80 CKD Stage III - GFR 30-59 CKD Stage IV - GFR 15-29 CKD Stage V - GFR < 1 ESRD (End Stage Renal Disease) Other condition_ Cannot Clinically determine   Supporting Information:  Risk Factors: (As per notes) "Cellulitis"  & "CKD"  Signs & Symptoms:(As per notes) "#2 Chronic Kidney disease: moderately severe and will try to  adjust meds to increase BP and renal perfusion."   Diagnostics:  Lab:     07/10/12 0500    07/09/12 2054       BUN    29*    22   CREATININE   1.81*    1.52*     Component      GFR calc non Af Amer  Latest Ref Rng      >90 mL/min  11/16/2011      38 (L)  07/09/2012     5:04 PM 31 (L)  07/09/2012     8:54 PM 29 (L)  07/10/2012      23 (L)  07/11/2012      22 (L)      Urine: Component Color, Urine APPearance Specific Gravity, Urine pH  Latest Ref Rng YELLOW CLEAR 1.005 - 1.030 5.0 - 8.0  07/10/2012 AMBER (A) CLOUDY (A) 1.025 5.0   Component Glucose Hgb urine dipstick Bilirubin Urine  Latest Ref Rng NEGATIVE mg/dL NEGATIVE NEGATIVE  07/10/2012 NEGATIVE NEGATIVE NEGATIVE   Component Ketones, ur Protein Urobilinogen, UA  Latest Ref Rng NEGATIVE mg/dL NEGATIVE mg/dL 0.0 - 1.0  mg/dL  07/10/2012 15 (A) NEGATIVE 0.2   Component Nitrite Leukocytes, UA  Latest Ref Rng NEGATIVE NEGATIVE  07/10/2012 NEGATIVE NEGATIVE    You may use possible, probable, or suspect with inpatient documentation. possible, probable, suspected diagnoses MUST be documented at the time of discharge  Reviewed:  no additional documentation provided   Thank You,  Wallie Char Delk RN, BSN, CCDS  Clinical Documentation Specialist: [phone] [place]

## 2012-07-11 NOTE — Progress Notes (Signed)
Patient has a fall risk score of 10. Patient resting in chair during morning assessment. Will place bed alarm on when patient is back in bed. Door open for now. RN and NT completing hourly rounding and ask ing the "4 P's".

## 2012-07-12 LAB — IRON AND TIBC
Iron: 75 ug/dL (ref 42–135)
Saturation Ratios: 27 % (ref 20–55)
TIBC: 273 ug/dL (ref 250–470)
UIBC: 198 ug/dL (ref 125–400)

## 2012-07-12 LAB — BASIC METABOLIC PANEL
CO2: 22 mEq/L (ref 19–32)
Calcium: 9 mg/dL (ref 8.4–10.5)
Creatinine, Ser: 1.4 mg/dL — ABNORMAL HIGH (ref 0.50–1.10)
Glucose, Bld: 148 mg/dL — ABNORMAL HIGH (ref 70–99)

## 2012-07-12 LAB — CBC
MCH: 20.3 pg — ABNORMAL LOW (ref 26.0–34.0)
MCHC: 32.9 g/dL (ref 30.0–36.0)
MCV: 61.6 fL — ABNORMAL LOW (ref 78.0–100.0)
Platelets: 182 10*3/uL (ref 150–400)
RDW: 15 % (ref 11.5–15.5)
WBC: 11.2 10*3/uL — ABNORMAL HIGH (ref 4.0–10.5)

## 2012-07-12 LAB — GLUCOSE, CAPILLARY: Glucose-Capillary: 118 mg/dL — ABNORMAL HIGH (ref 70–99)

## 2012-07-12 LAB — FERRITIN: Ferritin: 392 ng/mL — ABNORMAL HIGH (ref 10–291)

## 2012-07-12 MED ORDER — SODIUM CHLORIDE 0.9 % IV SOLN: INTRAVENOUS | Status: DC

## 2012-07-12 NOTE — Progress Notes (Signed)
[place] NOTE   Pharmacy Consult for restarting Pradaxa Indication: Afib  Assessment/Plan: Ms. Liberato was on Pradaxa PTA for stroke prevention with nonvalvular Afib.  This medication has not been studied in patients over 77 years old and therefore is not recommended.  However, I suspect her outpatient physicians have reviewed the other options and made the decision to continue Pradaxa despite this information.  Based on her renal function (CrCl ~ 30), her home dose of 75mg  PO BID is appropriate.  When restarting this medication it needs to be given at least 12 hours after the last Lovenox dose and the Lovenox should be discontinued.  Overlap therapy is not required.  Please call the pharmacy if you have any further questions or concerns.  [employer], Pharm.D., BCPS Clinical Pharmacist Pager [phone] 07/12/2012 11:02 AM

## 2012-07-12 NOTE — Progress Notes (Signed)
Subjective: Continues to have mild rhinitis and clearing of her throat. She denies dyspnea or leg pain, appetite is excellent.   Objective: Vital signs in last 24 hours: Temp:  [97.3 F (36.3 C)-97.7 F (36.5 C)] 97.3 F (36.3 C) (01/07 0548) Pulse Rate:  [56-89] 56  (01/07 0548) Resp:  [17-20] 20  (01/07 0548) BP: (112-118)/(59-69) 113/59 mmHg (01/07 0548) SpO2:  [94 %-97 %] 97 % (01/07 0548) Weight change:    Intake/Output from previous day: 01/06 0701 - 01/07 0700 In: 960 [P.O.:960] Out: -    General appearance: alert, cooperative and no distress Resp: clear to auscultation bilaterally Cardio: irregularly irregular rhythm Extremities: bilateral 2+ leg edema, reduced bilateral leg erythema  Lab Results:  Basename 07/12/12 0557 07/11/12 0745  WBC 11.2* 13.7*  HGB 9.7* 10.5*  HCT 29.5* 32.3*  PLT 182 167   BMET  Basename 07/12/12 0557 07/11/12 0745  NA 123* 126*  K 4.1 4.6  CL 87* 87*  CO2 22 18*  GLUCOSE 148* 185*  BUN 39* 44*  CREATININE 1.40* 1.90*  CALCIUM 9.0 9.0   CMET CMP     Component Value Date/Time   NA 123* 07/12/2012 0557   K 4.1 07/12/2012 0557   CL 87* 07/12/2012 0557   CO2 22 07/12/2012 0557   GLUCOSE 148* 07/12/2012 0557   BUN 39* 07/12/2012 0557   CREATININE 1.40* 07/12/2012 0557   CALCIUM 9.0 07/12/2012 0557   PROT 6.5 11/11/2011 1357   ALBUMIN 3.5 11/11/2011 1357   AST 30 11/11/2011 1357   ALT 30 11/11/2011 1357   ALKPHOS 103 11/11/2011 1357   BILITOT 0.6 11/11/2011 1357   GFRNONAA 32* 07/12/2012 0557   GFRAA 37* 07/12/2012 0557    CBG (last 3)   Basename 07/12/12 0542 07/11/12 2128 07/11/12 1651  GLUCAP 154* 145* 165*    INR RESULTS:   No results found for this basename: INR, PROTIME     Studies/Results: No results found.  Medications: I have reviewed the patient's current medications.  Assessment/Plan: #1 Cellulitis: much improved. Will discontinue unasyn and add air compression hose. Likely d/c tomorrow on po antibiotics. #2 Hyponatremia:  interval worsening of hyponatremia. Will continue to hold lasix and give IV saline today. #3 Anemia: moderate, and partially due to thallesemia. Will recheck iron studies today.  #4 Acute Renal Insufficiency: improved with improved blood pressure.   LOS: 3 days   Brietta Manso G 07/12/2012, 8:00 AM

## 2012-07-13 DIAGNOSIS — E871 Hypo-osmolality and hyponatremia: Secondary | ICD-10-CM | POA: Diagnosis not present

## 2012-07-13 LAB — BASIC METABOLIC PANEL
BUN: 35 mg/dL — ABNORMAL HIGH (ref 6–23)
CO2: 23 mEq/L (ref 19–32)
Chloride: 90 mEq/L — ABNORMAL LOW (ref 96–112)
Creatinine, Ser: 1.18 mg/dL — ABNORMAL HIGH (ref 0.50–1.10)

## 2012-07-13 MED ORDER — CLINDAMYCIN HCL 300 MG PO CAPS: 300.0000 mg | ORAL_CAPSULE | Freq: Three times a day (TID) | ORAL | Status: AC

## 2012-07-13 MED ORDER — PREDNISONE (PAK) 10 MG PO TABS: 20.0000 mg | ORAL_TABLET | Freq: Every day | ORAL | Status: AC

## 2012-07-13 NOTE — Discharge Summary (Signed)
Physician Discharge Summary  Patient ID: Lori [name] MRN: [id number] DOB/AGE: February 09, 1919 79 y.o. y.o.  Admit date: 07/09/2012 Discharge date: 07/13/2012   Discharge Diagnoses:  Principal Problem:  *Cellulitis of leg Active Problems:  Hyponatremia  ANEMIA, IRON DEFICIENCY  ASTHMA UNSPECIFIED  Unspecified essential hypertension   Discharged Condition: good  Hospital Course: the patient is a 79 year old Caucasian woman with a history of bilateral chronic venous insufficiency.  She uses a lymphedema pump on a regular basis. She developed fever, chills, cough, and wheezing and presented to the  Emergency room for evaluation. Her workup showed fever, leukocytosis, and recurrent stasis dermatitis. She was admitted and started on unasyn and vancomycin and did well with this. She had moderate hyponatremia that stabilized. On the day of discharge she felt fine with no leg pain, fever, chills, or dyspnea. She had no complications during her hospitalization.   Consults: None  Significant Diagnostic Studies:  No results found.  Labs: Lab Results  Component Value Date   WBC 11.2* 07/12/2012   HGB 9.7* 07/12/2012   HCT 29.5* 07/12/2012   MCV 61.6* 07/12/2012   PLT 182 07/12/2012     Lab 07/13/12 0535  NA 124*  K 4.0  CL 90*  CO2 23  BUN 35*  CREATININE 1.18*  CALCIUM 8.8  PROT --  BILITOT --  ALKPHOS --  ALT --  AST --  GLUCOSE 140*       No results found for this basename: INR, PROTIME     Recent Results (from the past 240 hour(s))  CULTURE, BLOOD (ROUTINE X 2)     Status: Normal (Preliminary result)   Collection Time   07/09/12  6:06 PM      Component Value Range Status Comment   Specimen Description BLOOD LEFT ARM   Final    Special Requests BOTTLES DRAWN AEROBIC AND ANAEROBIC 10CC   Final    Culture  Setup Time 07/10/2012 01:31   Final    Culture     Final    Value:        BLOOD CULTURE RECEIVED NO GROWTH TO DATE CULTURE WILL BE HELD FOR 5 DAYS BEFORE ISSUING A FINAL NEGATIVE  REPORT   Report Status PENDING   Incomplete   CULTURE, BLOOD (ROUTINE X 2)     Status: Normal (Preliminary result)   Collection Time   07/09/12  6:13 PM      Component Value Range Status Comment   Specimen Description BLOOD LEFT HAND   Final    Special Requests BOTTLES DRAWN AEROBIC ONLY [place]   Final    Culture  Setup Time 07/10/2012 01:31   Final    Culture     Final    Value:        BLOOD CULTURE RECEIVED NO GROWTH TO DATE CULTURE WILL BE HELD FOR 5 DAYS BEFORE ISSUING A FINAL NEGATIVE REPORT   Report Status PENDING   Incomplete       Discharge Exam: Blood pressure 138/76, pulse 88, temperature 97.2 F (36.2 C), temperature source Oral, resp. rate 18, SpO2 97.00%.  Physical Exam: She is an elderly woman who was in no apparent distress sitting in a chair. Her chest was clear, heart had an irregularly irregular rhythm, abdomen was benign, she had bilateral 2+ leg edema with minimal distal leg erythema and no tenderness. She was alert and well oriented with no focal neurologic deficits.  Disposition: She will be discharged to home today and will have home health nursing care.  Discharge Orders    Future Orders Please Complete By Expires   Ambulatory referral to [place]      Comments:   Please evaluate Lori [name] for admission to [facility].  Disciplines requested: Nursing and Physical Therapy  Services to provide: Strengthening Exercises  Physician to follow patient's care (the person listed here will be responsible for signing ongoing orders): PCP  Requested Start of Care Date: Tomorrow  Special Instructions:  She should have home nursing care for cellulitis and application of bilateral unna boots. She has gait instability and deconditioning and will need PT as well.   Diet - low sodium heart healthy      Increase activity slowly      Discharge instructions      Comments:   She will be discharged to home. We will try to set up home nursing care with PT upon  discharge. She should call our office today to set up a followup visit in 1 week.   Call MD for:      Comments:   Call for fever, chills, severe diarrhea, or other concerning symptoms       Medication List     As of 07/13/2012  9:05 AM    STOP taking these medications         predniSONE 10 MG tablet   Commonly known as: DELTASONE      verapamil 120 MG 24 hr capsule   Commonly known as: VERELAN PM      TAKE these medications         albuterol (2.5 MG/3ML) 0.083% nebulizer solution   Commonly known as: PROVENTIL   Take 2.5 mg by nebulization 4 (four) times daily.      ALIGN PO   Take 1 capsule by mouth every morning.      beta carotene w/minerals tablet   Take 1 tablet by mouth 2 (two) times daily.      budesonide-formoterol 160-4.5 MCG/ACT inhaler   Commonly known as: SYMBICORT   Inhale 2 puffs into the lungs 2 (two) times daily.      ciclesonide 50 MCG/ACT nasal spray   Commonly known as: OMNARIS   Place 1 spray into both nostrils daily.      clindamycin 300 MG capsule   Commonly known as: CLEOCIN   Take 1 capsule (300 mg total) by mouth 3 (three) times daily.      folic acid [id number] MCG tablet   Commonly known as: FOLVITE   Take 800 mcg by mouth every morning.      furosemide 20 MG tablet   Commonly known as: LASIX   Take 2 tablets (40 mg total) by mouth daily.      metoprolol tartrate 25 MG tablet   Commonly known as: LOPRESSOR   Take 1 tablet (25 mg total) by mouth 2 (two) times daily.      multivitamin with minerals Tabs   Take 1 tablet by mouth at bedtime.      omeprazole 40 MG capsule   Commonly known as: PRILOSEC   Take 40 mg by mouth 2 (two) times daily.      PATANASE NA   Place 1 spray into both nostrils 2 (two) times daily.      PRADAXA 75 MG Caps   Generic drug: dabigatran   TAKE ONE CAPSULE BY MOUTH TWICE DAILY TO PREVENT A STROKE      predniSONE 10 MG tablet   Commonly known as: STERAPRED UNI-PAK   Take  2 tablets (20 mg total) by mouth  daily.      Selenium 200 MCG Tabs   Take 1 tablet by mouth daily.      spironolactone 25 MG tablet   Commonly known as: ALDACTONE   Take 1 tablet (25 mg total) by mouth daily.      vitamin C 500 MG tablet   Commonly known as: ASCORBIC ACID   Take 500 mg by mouth 2 (two) times daily.         Signed: Donnajean Lopes 07/13/2012, 9:05 AM

## 2012-07-13 NOTE — Progress Notes (Signed)
Patient asked this writer to notify IV team for IV restart (d/t IV bleeding) and Respiratory Therapist for inhaler and neb tx. Writer did as asked by patient. IV team cane and restarted IV. Did not see Symbicort on worklist as it was scheduled at 2200. Read in computer where RT did not give Symbicort bc she could not located it and checked with RN. Called her again and stated to RT that I seen her comment and she did not check with this writer whom was patient's RN from 7p-7a. The RT stated I "think" that is the one I could not locate. Told her inhaler was in drawer which is located in med room. Told her that the patient is requesting inhaler now and breathing tx. Around 2200 saw RT up on 5N going to room 5n18. Told patient RT is up here and she will be with you shortly. It was close to 0100 and NT said RT has not seen patient in 5N19 yet, please call them back. She is upset and crying. At that time I was in 5n21 and asked if the CN could call. RT came to the floor and she told me she had switched assigments with another RT like 45 mins ago. RT went to see patient and patient said RT cut her neb tx short, did not rinse her mouth out after inhaler administration and left in a hurry. She was in tears and stated she was going to write a letter to the [facility] She was dissatisfied with her care by RT. For this to be a beautiful building, the service was poor. Writer assisted patient in rinsing her mouth and apologized for everything and nothing seemed to soothe her. She said the nursing staff was excellent but this has been the worst night of her life. She could not sleep bc she was so upset. Reported to on coming Interior and spatial designer. Will continue to monitor.

## 2012-07-13 NOTE — Progress Notes (Signed)
Patient given discharge instructions, and told where to pick up her 2 prescriptions. All questions answered. Patient discharged with volunteer services to granddaughters car.

## 2012-07-16 LAB — CULTURE, BLOOD (ROUTINE X 2)
Culture: NO GROWTH
Culture: NO GROWTH

## 2012-07-19 DIAGNOSIS — L02419 Cutaneous abscess of limb, unspecified: Secondary | ICD-10-CM | POA: Diagnosis not present

## 2012-07-19 DIAGNOSIS — R269 Unspecified abnormalities of gait and mobility: Secondary | ICD-10-CM | POA: Diagnosis not present

## 2012-07-19 DIAGNOSIS — I872 Venous insufficiency (chronic) (peripheral): Secondary | ICD-10-CM | POA: Diagnosis not present

## 2012-07-19 DIAGNOSIS — I10 Essential (primary) hypertension: Secondary | ICD-10-CM | POA: Diagnosis not present

## 2012-07-19 DIAGNOSIS — D649 Anemia, unspecified: Secondary | ICD-10-CM | POA: Diagnosis not present

## 2012-07-21 DIAGNOSIS — D508 Other iron deficiency anemias: Secondary | ICD-10-CM | POA: Diagnosis not present

## 2012-07-21 DIAGNOSIS — I4891 Unspecified atrial fibrillation: Secondary | ICD-10-CM | POA: Diagnosis not present

## 2012-07-21 DIAGNOSIS — I10 Essential (primary) hypertension: Secondary | ICD-10-CM | POA: Diagnosis not present

## 2012-07-21 DIAGNOSIS — Z79899 Other long term (current) drug therapy: Secondary | ICD-10-CM | POA: Diagnosis not present

## 2012-07-28 DIAGNOSIS — H353290 Exudative age-related macular degeneration, unspecified eye, stage unspecified: Secondary | ICD-10-CM | POA: Diagnosis not present

## 2012-09-08 DIAGNOSIS — H353290 Exudative age-related macular degeneration, unspecified eye, stage unspecified: Secondary | ICD-10-CM | POA: Diagnosis not present

## 2012-09-23 DIAGNOSIS — I83009 Varicose veins of unspecified lower extremity with ulcer of unspecified site: Secondary | ICD-10-CM | POA: Diagnosis not present

## 2012-09-23 DIAGNOSIS — E871 Hypo-osmolality and hyponatremia: Secondary | ICD-10-CM | POA: Diagnosis not present

## 2012-09-23 DIAGNOSIS — L97909 Non-pressure chronic ulcer of unspecified part of unspecified lower leg with unspecified severity: Secondary | ICD-10-CM | POA: Diagnosis not present

## 2012-09-23 DIAGNOSIS — D508 Other iron deficiency anemias: Secondary | ICD-10-CM | POA: Diagnosis not present

## 2012-09-23 DIAGNOSIS — Z79899 Other long term (current) drug therapy: Secondary | ICD-10-CM | POA: Diagnosis not present

## 2012-10-20 DIAGNOSIS — H353290 Exudative age-related macular degeneration, unspecified eye, stage unspecified: Secondary | ICD-10-CM | POA: Diagnosis not present

## 2012-12-01 DIAGNOSIS — H353290 Exudative age-related macular degeneration, unspecified eye, stage unspecified: Secondary | ICD-10-CM | POA: Diagnosis not present

## 2013-01-05 DIAGNOSIS — H353290 Exudative age-related macular degeneration, unspecified eye, stage unspecified: Secondary | ICD-10-CM | POA: Diagnosis not present

## 2013-02-09 DIAGNOSIS — H353290 Exudative age-related macular degeneration, unspecified eye, stage unspecified: Secondary | ICD-10-CM | POA: Diagnosis not present

## 2013-03-08 DIAGNOSIS — I4891 Unspecified atrial fibrillation: Secondary | ICD-10-CM | POA: Diagnosis not present

## 2013-03-08 DIAGNOSIS — Z23 Encounter for immunization: Secondary | ICD-10-CM | POA: Diagnosis not present

## 2013-03-08 DIAGNOSIS — Z1331 Encounter for screening for depression: Secondary | ICD-10-CM | POA: Diagnosis not present

## 2013-03-08 DIAGNOSIS — Z79899 Other long term (current) drug therapy: Secondary | ICD-10-CM | POA: Diagnosis not present

## 2013-03-08 DIAGNOSIS — M25569 Pain in unspecified knee: Secondary | ICD-10-CM | POA: Diagnosis not present

## 2013-03-08 DIAGNOSIS — Z6832 Body mass index (BMI) 32.0-32.9, adult: Secondary | ICD-10-CM | POA: Diagnosis not present

## 2013-03-08 DIAGNOSIS — I10 Essential (primary) hypertension: Secondary | ICD-10-CM | POA: Diagnosis not present

## 2013-03-23 DIAGNOSIS — H353290 Exudative age-related macular degeneration, unspecified eye, stage unspecified: Secondary | ICD-10-CM | POA: Diagnosis not present

## 2013-04-06 ENCOUNTER — Encounter: Payer: Self-pay | Admitting: Internal Medicine

## 2013-04-06 ENCOUNTER — Ambulatory Visit (INDEPENDENT_AMBULATORY_CARE_PROVIDER_SITE_OTHER): Payer: Medicare Other | Admitting: Internal Medicine

## 2013-04-06 VITALS — BP 110/68 | HR 58 | Ht 65.0 in | Wt 190.0 lb

## 2013-04-06 DIAGNOSIS — R609 Edema, unspecified: Secondary | ICD-10-CM | POA: Diagnosis not present

## 2013-04-06 DIAGNOSIS — I4891 Unspecified atrial fibrillation: Secondary | ICD-10-CM

## 2013-04-06 DIAGNOSIS — R600 Localized edema: Secondary | ICD-10-CM

## 2013-04-06 NOTE — Patient Instructions (Addendum)
Your physician wants you to follow-up in: 12 months with Dr. Taylor. You will receive a reminder letter in the mail two months in advance. If you don't receive a letter, please call our office to schedule the follow-up appointment.    

## 2013-04-06 NOTE — Assessment & Plan Note (Signed)
The patient now has chronic atrial fibrillation. Her ventricular rates are well controlled. No change in anticoagulation strategy. We'll see her back in one year.

## 2013-04-06 NOTE — Progress Notes (Signed)
HPI Mrs. Lonsdale returns today for followup. She is a 79 year old woman with a history of chronic atrial fibrillation, hypertension, and venous insufficiency. In the interim she has done well. She recently got back from a trip to [place]. She denies chest pain , or syncope. She has class II dyspnea. She has chronic lower extremity edema. She denies palpitations. Allergies  Allergen Reactions  . Dust Mite Extract     Unknown  . Latex     Unknown  . Mold Extract [Trichophyton Mentagrophyte]     Unknown     Current Outpatient Prescriptions  Medication Sig Dispense Refill  . albuterol (PROVENTIL) (2.5 MG/3ML) 0.083% nebulizer solution Take 2.5 mg by nebulization 4 (four) times daily.      . ciclesonide (OMNARIS) 50 MCG/ACT nasal spray Place 1 spray into both nostrils daily.      . furosemide (LASIX) 20 MG tablet Take 2 tablets (40 mg total) by mouth daily.  60 tablet  12  . metoprolol tartrate (LOPRESSOR) 25 MG tablet Take 1 tablet (25 mg total) by mouth 2 (two) times daily.  60 tablet  12  . Multiple Vitamin (MULITIVITAMIN WITH MINERALS) TABS Take 1 tablet by mouth at bedtime.       . Multiple Vitamins-Minerals (ICAPS MV PO) Take 1 capsule by mouth 2 (two) times daily.       . Olopatadine HCl (PATANASE NA) Place 1 spray into both nostrils 2 (two) times daily.      Marland Kitchen PRADAXA 75 MG CAPS TAKE ONE CAPSULE BY MOUTH TWICE DAILY TO PREVENT A STROKE  60 capsule  10  . Probiotic Product (ALIGN PO) Take 1 capsule by mouth every morning.      Marland Kitchen spironolactone (ALDACTONE) 25 MG tablet Take 25 mg by mouth 3 (three) times a week. Monday Wednesday Friday      . verapamil (VERELAN PM) 120 MG 24 hr capsule Take 1 capsule by mouth daily.      . vitamin C (ASCORBIC ACID) 500 MG tablet Take 500 mg by mouth 2 (two) times daily.      . [DISCONTINUED] triamterene-hydrochlorothiazide (MAXZIDE-25) 37.5-25 MG per tablet Take 1 tablet by mouth daily.       No current facility-administered medications for  this visit.     Past Medical History  Diagnosis Date  . Chronic venous insufficiency   . Cellulitis   . Chronic acquired lymphedema   . Asthma   . Hyperlipidemia   . Hypertension   . Dysrhythmia   . Anemia     ROS:   All systems reviewed and negative except as noted in the HPI.   Past Surgical History  Procedure Laterality Date  . Left total knee replacement    . Right total hip replacement    . Joint replacement       Family History  Problem Relation Age of Onset  . Other Father     unknown causes  . Lung cancer Mother   . Breast cancer Neg Hx   . Diabetes Neg Hx      History   Social History  . Marital Status: Widowed    Spouse Name: N/A    Number of Children: N/A  . Years of Education: N/A   Occupational History  . Not on file.   Social History Main Topics  . Smoking status: Former Smoker    Quit date: 07/28/1959  . Smokeless tobacco: Not on file  . Alcohol Use: No  .  Drug Use: No  . Sexual Activity: Not on file   Other Topics Concern  . Not on file   Social History Narrative  . No narrative on file     BP 110/68  Pulse 58  Ht 5\' 5"  (1.651 m)  Wt 190 lb (86.183 kg)  BMI 31.62 kg/m2  Physical Exam:  Well appearing elderly woman,NAD HEENT: Unremarkable Neck:  No JVD, no thyromegally Back:  No CVA tenderness Lungs:  Clear HEART:  Regular rate rhythm, no murmurs, no rubs, no clicks Abd:  soft, positive bowel sounds, no organomegally, no rebound, no guarding Ext:  2 plus pulses, 2+ peripheral edema, no cyanosis, no clubbing Skin:  No rashes no nodules Neuro:  CN II through XII intact, motor grossly intact  EKG Atrial fibrillation with a controlled ventricular response.  Assess/Plan:

## 2013-04-06 NOTE — Assessment & Plan Note (Signed)
Her peripheral edema is stable. She is encouraged to maintain a low-sodium diet, and she will continue her diuretic therapy.

## 2013-04-11 ENCOUNTER — Other Ambulatory Visit: Payer: Self-pay

## 2013-04-11 DIAGNOSIS — I10 Essential (primary) hypertension: Secondary | ICD-10-CM

## 2013-04-11 MED ORDER — METOPROLOL TARTRATE 25 MG PO TABS: 25.0000 mg | ORAL_TABLET | Freq: Two times a day (BID) | ORAL | Status: DC

## 2013-04-18 ENCOUNTER — Other Ambulatory Visit: Payer: Self-pay

## 2013-04-18 DIAGNOSIS — I4891 Unspecified atrial fibrillation: Secondary | ICD-10-CM

## 2013-04-18 MED ORDER — VERAPAMIL HCL ER 120 MG PO CP24: 120.0000 mg | ORAL_CAPSULE | Freq: Every day | ORAL | Status: DC

## 2013-04-18 MED ORDER — DABIGATRAN ETEXILATE MESYLATE 75 MG PO CAPS: 75.0000 mg | ORAL_CAPSULE | Freq: Two times a day (BID) | ORAL | Status: DC

## 2013-04-19 ENCOUNTER — Telehealth: Payer: Self-pay | Admitting: *Deleted

## 2013-04-19 ENCOUNTER — Telehealth: Payer: Self-pay | Admitting: Internal Medicine

## 2013-04-19 DIAGNOSIS — I4891 Unspecified atrial fibrillation: Secondary | ICD-10-CM

## 2013-04-19 NOTE — Telephone Encounter (Signed)
New message   On prodaxa and verapamil---[facility]-- Out of prodaxa and almost out of verapamil--do we have samples until rx comes

## 2013-04-20 DIAGNOSIS — M1710 Unilateral primary osteoarthritis, unspecified knee: Secondary | ICD-10-CM | POA: Diagnosis not present

## 2013-04-20 NOTE — Telephone Encounter (Signed)
Follow up:  Pt states she is returning [facility] phone call. However, pt states she is about to leave for [place] and may be unavailable until later this afternoon.

## 2013-04-20 NOTE — Telephone Encounter (Signed)
Samples left at front dest for Pradaxa yesterday. LMOM

## 2013-04-21 ENCOUNTER — Other Ambulatory Visit: Payer: Self-pay

## 2013-04-21 DIAGNOSIS — R609 Edema, unspecified: Secondary | ICD-10-CM

## 2013-04-21 MED ORDER — SPIRONOLACTONE 25 MG PO TABS: 25.0000 mg | ORAL_TABLET | ORAL | Status: DC

## 2013-04-25 ENCOUNTER — Telehealth: Payer: Self-pay | Admitting: Internal Medicine

## 2013-04-25 MED ORDER — VERAPAMIL HCL ER 120 MG PO CP24: 120.0000 mg | ORAL_CAPSULE | Freq: Two times a day (BID) | ORAL | Status: DC

## 2013-04-25 NOTE — Telephone Encounter (Signed)
New Problem  Pt requests a  call back to discuss the dosage for Verapamil.... Please call

## 2013-04-25 NOTE — Telephone Encounter (Signed)
Walk in pt Form " pt Dropped Off Sealed Envelope" Gave to [facility] 04/25/13/KM

## 2013-04-25 NOTE — Telephone Encounter (Signed)
Sent in her Rx to [facility] for bid  Her knee feels much better after injection.  She will continue to

## 2013-04-26 NOTE — Telephone Encounter (Signed)
Follow Up:  Pt states she is returning a call from [place].

## 2013-04-26 NOTE — Telephone Encounter (Signed)
I spoke with patient yesterday and she does not need any more help

## 2013-05-04 DIAGNOSIS — H353290 Exudative age-related macular degeneration, unspecified eye, stage unspecified: Secondary | ICD-10-CM | POA: Diagnosis not present

## 2013-06-08 DIAGNOSIS — H31019 Macula scars of posterior pole (postinflammatory) (post-traumatic), unspecified eye: Secondary | ICD-10-CM | POA: Diagnosis not present

## 2013-06-08 DIAGNOSIS — H353290 Exudative age-related macular degeneration, unspecified eye, stage unspecified: Secondary | ICD-10-CM | POA: Diagnosis not present

## 2013-06-08 DIAGNOSIS — Z961 Presence of intraocular lens: Secondary | ICD-10-CM | POA: Diagnosis not present

## 2013-06-22 ENCOUNTER — Telehealth: Payer: Self-pay | Admitting: *Deleted

## 2013-06-22 NOTE — Telephone Encounter (Signed)
Patient brought her letter to office from [facility] for the medication Pradaxa, medication  will need PA for year 07/06/2013, called and confirmed with [facility], they state I can complete form and fax to them today, done.

## 2013-07-20 DIAGNOSIS — H353290 Exudative age-related macular degeneration, unspecified eye, stage unspecified: Secondary | ICD-10-CM | POA: Diagnosis not present

## 2013-08-21 ENCOUNTER — Telehealth: Payer: Self-pay | Admitting: *Deleted

## 2013-08-21 NOTE — Telephone Encounter (Signed)
Pradaxa [facility] though 08/21/2014

## 2013-08-21 NOTE — Telephone Encounter (Signed)
PA for pradaxa to [facility]

## 2013-09-07 DIAGNOSIS — H353290 Exudative age-related macular degeneration, unspecified eye, stage unspecified: Secondary | ICD-10-CM | POA: Diagnosis not present

## 2013-09-11 DIAGNOSIS — D485 Neoplasm of uncertain behavior of skin: Secondary | ICD-10-CM | POA: Diagnosis not present

## 2013-09-11 DIAGNOSIS — C44319 Basal cell carcinoma of skin of other parts of face: Secondary | ICD-10-CM | POA: Diagnosis not present

## 2013-09-11 DIAGNOSIS — L821 Other seborrheic keratosis: Secondary | ICD-10-CM | POA: Diagnosis not present

## 2013-10-13 DIAGNOSIS — M25569 Pain in unspecified knee: Secondary | ICD-10-CM | POA: Diagnosis not present

## 2013-10-13 DIAGNOSIS — I10 Essential (primary) hypertension: Secondary | ICD-10-CM | POA: Diagnosis not present

## 2013-10-13 DIAGNOSIS — I8310 Varicose veins of unspecified lower extremity with inflammation: Secondary | ICD-10-CM | POA: Diagnosis not present

## 2013-10-13 DIAGNOSIS — R42 Dizziness and giddiness: Secondary | ICD-10-CM | POA: Diagnosis not present

## 2013-10-13 DIAGNOSIS — I4891 Unspecified atrial fibrillation: Secondary | ICD-10-CM | POA: Diagnosis not present

## 2013-10-13 DIAGNOSIS — Z6832 Body mass index (BMI) 32.0-32.9, adult: Secondary | ICD-10-CM | POA: Diagnosis not present

## 2013-10-19 DIAGNOSIS — H353290 Exudative age-related macular degeneration, unspecified eye, stage unspecified: Secondary | ICD-10-CM | POA: Diagnosis not present

## 2013-12-07 DIAGNOSIS — H353290 Exudative age-related macular degeneration, unspecified eye, stage unspecified: Secondary | ICD-10-CM | POA: Diagnosis not present

## 2013-12-13 DIAGNOSIS — I4891 Unspecified atrial fibrillation: Secondary | ICD-10-CM | POA: Diagnosis not present

## 2013-12-13 DIAGNOSIS — I8310 Varicose veins of unspecified lower extremity with inflammation: Secondary | ICD-10-CM | POA: Diagnosis not present

## 2013-12-13 DIAGNOSIS — I10 Essential (primary) hypertension: Secondary | ICD-10-CM | POA: Diagnosis not present

## 2013-12-13 DIAGNOSIS — J45909 Unspecified asthma, uncomplicated: Secondary | ICD-10-CM | POA: Diagnosis not present

## 2014-01-03 DIAGNOSIS — M1710 Unilateral primary osteoarthritis, unspecified knee: Secondary | ICD-10-CM | POA: Diagnosis not present

## 2014-01-25 DIAGNOSIS — H4011X0 Primary open-angle glaucoma, stage unspecified: Secondary | ICD-10-CM | POA: Diagnosis not present

## 2014-01-25 DIAGNOSIS — H353290 Exudative age-related macular degeneration, unspecified eye, stage unspecified: Secondary | ICD-10-CM | POA: Diagnosis not present

## 2014-01-25 DIAGNOSIS — Z961 Presence of intraocular lens: Secondary | ICD-10-CM | POA: Diagnosis not present

## 2014-01-25 DIAGNOSIS — H31019 Macula scars of posterior pole (postinflammatory) (post-traumatic), unspecified eye: Secondary | ICD-10-CM | POA: Diagnosis not present

## 2014-03-15 DIAGNOSIS — Z961 Presence of intraocular lens: Secondary | ICD-10-CM | POA: Diagnosis not present

## 2014-03-15 DIAGNOSIS — H4011X0 Primary open-angle glaucoma, stage unspecified: Secondary | ICD-10-CM | POA: Diagnosis not present

## 2014-03-15 DIAGNOSIS — H353290 Exudative age-related macular degeneration, unspecified eye, stage unspecified: Secondary | ICD-10-CM | POA: Diagnosis not present

## 2014-03-15 DIAGNOSIS — H31019 Macula scars of posterior pole (postinflammatory) (post-traumatic), unspecified eye: Secondary | ICD-10-CM | POA: Diagnosis not present

## 2014-04-05 DIAGNOSIS — E785 Hyperlipidemia, unspecified: Secondary | ICD-10-CM | POA: Diagnosis not present

## 2014-04-05 DIAGNOSIS — I10 Essential (primary) hypertension: Secondary | ICD-10-CM | POA: Diagnosis not present

## 2014-04-10 ENCOUNTER — Ambulatory Visit: Payer: Medicare Other | Admitting: Internal Medicine

## 2014-04-12 DIAGNOSIS — Z23 Encounter for immunization: Secondary | ICD-10-CM | POA: Diagnosis not present

## 2014-04-12 DIAGNOSIS — Z1389 Encounter for screening for other disorder: Secondary | ICD-10-CM | POA: Diagnosis not present

## 2014-04-12 DIAGNOSIS — E785 Hyperlipidemia, unspecified: Secondary | ICD-10-CM | POA: Diagnosis not present

## 2014-04-12 DIAGNOSIS — I480 Paroxysmal atrial fibrillation: Secondary | ICD-10-CM | POA: Diagnosis not present

## 2014-04-12 DIAGNOSIS — I872 Venous insufficiency (chronic) (peripheral): Secondary | ICD-10-CM | POA: Diagnosis not present

## 2014-04-12 DIAGNOSIS — I10 Essential (primary) hypertension: Secondary | ICD-10-CM | POA: Diagnosis not present

## 2014-04-12 DIAGNOSIS — Z0000 Encounter for general adult medical examination without abnormal findings: Secondary | ICD-10-CM | POA: Diagnosis not present

## 2014-04-12 DIAGNOSIS — M25561 Pain in right knee: Secondary | ICD-10-CM | POA: Diagnosis not present

## 2014-04-12 DIAGNOSIS — D509 Iron deficiency anemia, unspecified: Secondary | ICD-10-CM | POA: Diagnosis not present

## 2014-04-12 DIAGNOSIS — J45909 Unspecified asthma, uncomplicated: Secondary | ICD-10-CM | POA: Diagnosis not present

## 2014-05-01 ENCOUNTER — Other Ambulatory Visit: Payer: Self-pay

## 2014-05-05 ENCOUNTER — Other Ambulatory Visit: Payer: Self-pay

## 2014-05-05 DIAGNOSIS — I10 Essential (primary) hypertension: Secondary | ICD-10-CM

## 2014-05-05 MED ORDER — METOPROLOL TARTRATE 25 MG PO TABS: 25.0000 mg | ORAL_TABLET | Freq: Two times a day (BID) | ORAL | Status: DC

## 2014-05-10 DIAGNOSIS — H31011 Macula scars of posterior pole (postinflammatory) (post-traumatic), right eye: Secondary | ICD-10-CM | POA: Diagnosis not present

## 2014-05-10 DIAGNOSIS — H3532 Exudative age-related macular degeneration: Secondary | ICD-10-CM | POA: Diagnosis not present

## 2014-05-10 DIAGNOSIS — H4011X0 Primary open-angle glaucoma, stage unspecified: Secondary | ICD-10-CM | POA: Diagnosis not present

## 2014-05-10 DIAGNOSIS — Z961 Presence of intraocular lens: Secondary | ICD-10-CM | POA: Diagnosis not present

## 2014-05-18 ENCOUNTER — Other Ambulatory Visit: Payer: Self-pay

## 2014-05-18 DIAGNOSIS — R609 Edema, unspecified: Secondary | ICD-10-CM

## 2014-05-18 MED ORDER — SPIRONOLACTONE 25 MG PO TABS: 25.0000 mg | ORAL_TABLET | ORAL | Status: DC

## 2014-05-24 ENCOUNTER — Ambulatory Visit (INDEPENDENT_AMBULATORY_CARE_PROVIDER_SITE_OTHER): Payer: Medicare Other | Admitting: Internal Medicine

## 2014-05-24 ENCOUNTER — Encounter: Payer: Self-pay | Admitting: Internal Medicine

## 2014-05-24 VITALS — BP 110/60 | HR 62 | Ht 64.0 in | Wt 179.8 lb

## 2014-05-24 DIAGNOSIS — I10 Essential (primary) hypertension: Secondary | ICD-10-CM | POA: Diagnosis not present

## 2014-05-24 DIAGNOSIS — I4891 Unspecified atrial fibrillation: Secondary | ICD-10-CM

## 2014-05-24 DIAGNOSIS — R609 Edema, unspecified: Secondary | ICD-10-CM | POA: Diagnosis not present

## 2014-05-24 NOTE — Patient Instructions (Addendum)
Your physician wants you to follow-up in: 12 months with Dr. Taylor. You will receive a reminder letter in the mail two months in advance. If you don't receive a letter, please call our office to schedule the follow-up appointment.    

## 2014-05-24 NOTE — Assessment & Plan Note (Signed)
Her ventricular rate is well controlled. She'll continue her current medical therapy. She has had no trouble with systemic anticoagulation.

## 2014-05-24 NOTE — Assessment & Plan Note (Signed)
We discussed the importance of maintaining a low-sodium diet, and keep her feet elevated. No change in medications today.

## 2014-05-24 NOTE — Assessment & Plan Note (Signed)
Her systolic blood pressure is well controlled. She'll continue her current medications and maintain a low-sodium diet.

## 2014-05-24 NOTE — Progress Notes (Signed)
HPI Lori [name] returns today for followup. She is a 80 year old woman with a history of chronic atrial fibrillation, hypertension, and venous insufficiency. In the interim she has done well. She denies chest pain , or syncope. She has class II dyspnea. She has chronic lower extremity edema. She denies palpitations. Allergies  Allergen Reactions  . Dust Mite Extract     Unknown  . Latex     Unknown  . Mold Extract [Trichophyton Mentagrophyte]     Unknown     Current Outpatient Prescriptions  Medication Sig Dispense Refill  . dabigatran (PRADAXA) 75 MG CAPS capsule Take 1 capsule (75 mg total) by mouth every 12 (twelve) hours. 180 capsule 3  . furosemide (LASIX) 20 MG tablet Take 2 tablets (40 mg total) by mouth daily. 60 tablet 12  . metoprolol tartrate (LOPRESSOR) 25 MG tablet Take 1 tablet (25 mg total) by mouth 2 (two) times daily. 60 tablet 12  . Multiple Vitamin (MULITIVITAMIN WITH MINERALS) TABS Take 1 tablet by mouth at bedtime.     . Multiple Vitamins-Minerals (ICAPS MV PO) Take 1 capsule by mouth 2 (two) times daily.     Marland Kitchen omeprazole (PRILOSEC) 40 MG capsule Take 40 mg by mouth daily.    Marland Kitchen spironolactone (ALDACTONE) 25 MG tablet Take 1 tablet (25 mg total) by mouth 3 (three) times a week. Monday Wednesday Friday 36 tablet 6  . timolol (TIMOPTIC) 0.5 % ophthalmic solution Place 1 drop into the left eye 2 (two) times daily.  12  . traMADol (ULTRAM) 50 MG tablet Take 1 tablet by mouth 3 (three) times daily as needed. Pain  3  . verapamil (VERELAN PM) 120 MG 24 hr capsule Take 1 capsule (120 mg total) by mouth 2 (two) times daily. 180 capsule 3  . vitamin C (ASCORBIC ACID) 500 MG tablet Take 500 mg by mouth 2 (two) times daily.    . Probiotic Product (ALIGN PO) Take 1 capsule by mouth every morning.    . [DISCONTINUED] triamterene-hydrochlorothiazide (MAXZIDE-25) 37.5-25 MG per tablet Take 1 tablet by mouth daily.     No current facility-administered medications for this  visit.     Past Medical History  Diagnosis Date  . Chronic venous insufficiency   . Cellulitis   . Chronic acquired lymphedema   . Asthma   . Hyperlipidemia   . Hypertension   . Dysrhythmia   . Anemia     ROS:   All systems reviewed and negative except as noted in the HPI.   Past Surgical History  Procedure Laterality Date  . Left total knee replacement    . Right total hip replacement    . Joint replacement       Family History  Problem Relation Age of Onset  . Other Father     unknown causes  . Lung cancer Mother   . Breast cancer Neg Hx   . Diabetes Neg Hx      History   Social History  . Marital Status: Widowed    Spouse Name: N/A    Number of Children: N/A  . Years of Education: N/A   Occupational History  . Not on file.   Social History Main Topics  . Smoking status: Former Smoker    Quit date: 07/28/1959  . Smokeless tobacco: Not on file  . Alcohol Use: No  . Drug Use: No  . Sexual Activity: Not on file   Other Topics Concern  . Not on  file   Social History Narrative     BP 110/60 mmHg  Pulse 62  Ht 5\' 4"  (1.626 m)  Wt 179 lb 12.8 oz (81.557 kg)  BMI 30.85 kg/m2  Physical Exam:  Well appearing elderly woman,NAD HEENT: Unremarkable Neck:  No JVD, no thyromegally Back:  No CVA tenderness Lungs:  Clear with no wheezes HEART:  IRegular rate rhythm, no murmurs, no rubs, no clicks Abd:  soft, positive bowel sounds, no organomegally, no rebound, no guarding Ext:  2 plus pulses, 2+ peripheral edema, no cyanosis, no clubbing Skin:  No rashes no nodules Neuro:  CN II through XII intact, motor grossly intact  EKG Atrial fibrillation with a controlled ventricular response.  Assess/Plan:

## 2014-05-27 ENCOUNTER — Other Ambulatory Visit: Payer: Self-pay | Admitting: Internal Medicine

## 2014-05-30 ENCOUNTER — Other Ambulatory Visit: Payer: Self-pay | Admitting: *Deleted

## 2014-05-30 DIAGNOSIS — R609 Edema, unspecified: Secondary | ICD-10-CM

## 2014-05-30 MED ORDER — SPIRONOLACTONE 25 MG PO TABS: 25.0000 mg | ORAL_TABLET | ORAL | Status: DC

## 2014-06-04 ENCOUNTER — Other Ambulatory Visit: Payer: Self-pay

## 2014-06-04 DIAGNOSIS — I10 Essential (primary) hypertension: Secondary | ICD-10-CM

## 2014-06-04 MED ORDER — METOPROLOL TARTRATE 25 MG PO TABS: 25.0000 mg | ORAL_TABLET | Freq: Two times a day (BID) | ORAL | Status: DC

## 2014-06-05 DIAGNOSIS — J309 Allergic rhinitis, unspecified: Secondary | ICD-10-CM | POA: Diagnosis not present

## 2014-06-21 DIAGNOSIS — H31011 Macula scars of posterior pole (postinflammatory) (post-traumatic), right eye: Secondary | ICD-10-CM | POA: Diagnosis not present

## 2014-06-21 DIAGNOSIS — H4011X0 Primary open-angle glaucoma, stage unspecified: Secondary | ICD-10-CM | POA: Diagnosis not present

## 2014-06-21 DIAGNOSIS — H3532 Exudative age-related macular degeneration: Secondary | ICD-10-CM | POA: Diagnosis not present

## 2014-06-21 DIAGNOSIS — Z961 Presence of intraocular lens: Secondary | ICD-10-CM | POA: Diagnosis not present

## 2014-07-17 DIAGNOSIS — M25561 Pain in right knee: Secondary | ICD-10-CM | POA: Diagnosis not present

## 2014-08-09 DIAGNOSIS — Z961 Presence of intraocular lens: Secondary | ICD-10-CM | POA: Diagnosis not present

## 2014-08-09 DIAGNOSIS — H3532 Exudative age-related macular degeneration: Secondary | ICD-10-CM | POA: Diagnosis not present

## 2014-08-09 DIAGNOSIS — H4011X0 Primary open-angle glaucoma, stage unspecified: Secondary | ICD-10-CM | POA: Diagnosis not present

## 2014-08-09 DIAGNOSIS — H31011 Macula scars of posterior pole (postinflammatory) (post-traumatic), right eye: Secondary | ICD-10-CM | POA: Diagnosis not present

## 2014-09-03 DIAGNOSIS — J45901 Unspecified asthma with (acute) exacerbation: Secondary | ICD-10-CM | POA: Diagnosis not present

## 2014-09-03 DIAGNOSIS — J309 Allergic rhinitis, unspecified: Secondary | ICD-10-CM | POA: Diagnosis not present

## 2014-09-18 ENCOUNTER — Telehealth: Payer: Self-pay | Admitting: *Deleted

## 2014-09-18 NOTE — Telephone Encounter (Signed)
PA form submitted by fax on 09/18/14 for Pradaxa.

## 2014-09-27 DIAGNOSIS — H3532 Exudative age-related macular degeneration: Secondary | ICD-10-CM | POA: Diagnosis not present

## 2014-10-16 DIAGNOSIS — H6123 Impacted cerumen, bilateral: Secondary | ICD-10-CM | POA: Diagnosis not present

## 2014-10-16 DIAGNOSIS — H9113 Presbycusis, bilateral: Secondary | ICD-10-CM | POA: Diagnosis not present

## 2014-10-19 DIAGNOSIS — M25561 Pain in right knee: Secondary | ICD-10-CM | POA: Diagnosis not present

## 2014-11-13 DIAGNOSIS — H903 Sensorineural hearing loss, bilateral: Secondary | ICD-10-CM | POA: Diagnosis not present

## 2014-11-15 DIAGNOSIS — H3532 Exudative age-related macular degeneration: Secondary | ICD-10-CM | POA: Diagnosis not present

## 2014-11-21 DIAGNOSIS — Z720 Tobacco use: Secondary | ICD-10-CM | POA: Diagnosis not present

## 2014-11-21 DIAGNOSIS — I10 Essential (primary) hypertension: Secondary | ICD-10-CM | POA: Diagnosis not present

## 2014-11-21 DIAGNOSIS — M1711 Unilateral primary osteoarthritis, right knee: Secondary | ICD-10-CM | POA: Diagnosis not present

## 2014-11-21 DIAGNOSIS — Z96642 Presence of left artificial hip joint: Secondary | ICD-10-CM | POA: Diagnosis not present

## 2014-11-21 DIAGNOSIS — M25561 Pain in right knee: Secondary | ICD-10-CM | POA: Diagnosis not present

## 2014-11-21 DIAGNOSIS — Z96641 Presence of right artificial hip joint: Secondary | ICD-10-CM | POA: Diagnosis not present

## 2014-12-12 DIAGNOSIS — Z6830 Body mass index (BMI) 30.0-30.9, adult: Secondary | ICD-10-CM | POA: Diagnosis not present

## 2014-12-12 DIAGNOSIS — L0390 Cellulitis, unspecified: Secondary | ICD-10-CM | POA: Diagnosis not present

## 2014-12-12 DIAGNOSIS — I872 Venous insufficiency (chronic) (peripheral): Secondary | ICD-10-CM | POA: Diagnosis not present

## 2014-12-12 DIAGNOSIS — M7989 Other specified soft tissue disorders: Secondary | ICD-10-CM | POA: Diagnosis not present

## 2014-12-12 DIAGNOSIS — I480 Paroxysmal atrial fibrillation: Secondary | ICD-10-CM | POA: Diagnosis not present

## 2015-01-03 DIAGNOSIS — H3532 Exudative age-related macular degeneration: Secondary | ICD-10-CM | POA: Diagnosis not present

## 2015-02-07 DIAGNOSIS — H3532 Exudative age-related macular degeneration: Secondary | ICD-10-CM | POA: Diagnosis not present

## 2015-02-08 DIAGNOSIS — I872 Venous insufficiency (chronic) (peripheral): Secondary | ICD-10-CM | POA: Diagnosis not present

## 2015-02-08 DIAGNOSIS — I8312 Varicose veins of left lower extremity with inflammation: Secondary | ICD-10-CM | POA: Diagnosis not present

## 2015-02-08 DIAGNOSIS — I8311 Varicose veins of right lower extremity with inflammation: Secondary | ICD-10-CM | POA: Diagnosis not present

## 2015-02-13 DIAGNOSIS — M25561 Pain in right knee: Secondary | ICD-10-CM | POA: Diagnosis not present

## 2015-02-13 DIAGNOSIS — M1711 Unilateral primary osteoarthritis, right knee: Secondary | ICD-10-CM | POA: Diagnosis not present

## 2015-02-21 DIAGNOSIS — Z6830 Body mass index (BMI) 30.0-30.9, adult: Secondary | ICD-10-CM | POA: Diagnosis not present

## 2015-02-21 DIAGNOSIS — B351 Tinea unguium: Secondary | ICD-10-CM | POA: Diagnosis not present

## 2015-02-21 DIAGNOSIS — I872 Venous insufficiency (chronic) (peripheral): Secondary | ICD-10-CM | POA: Diagnosis not present

## 2015-02-21 DIAGNOSIS — I87319 Chronic venous hypertension (idiopathic) with ulcer of unspecified lower extremity: Secondary | ICD-10-CM | POA: Diagnosis not present

## 2015-02-25 DIAGNOSIS — I8312 Varicose veins of left lower extremity with inflammation: Secondary | ICD-10-CM | POA: Diagnosis not present

## 2015-02-25 DIAGNOSIS — L720 Epidermal cyst: Secondary | ICD-10-CM | POA: Diagnosis not present

## 2015-02-25 DIAGNOSIS — I8311 Varicose veins of right lower extremity with inflammation: Secondary | ICD-10-CM | POA: Diagnosis not present

## 2015-02-25 DIAGNOSIS — I872 Venous insufficiency (chronic) (peripheral): Secondary | ICD-10-CM | POA: Diagnosis not present

## 2015-02-27 DIAGNOSIS — I10 Essential (primary) hypertension: Secondary | ICD-10-CM | POA: Diagnosis not present

## 2015-02-27 DIAGNOSIS — Z6829 Body mass index (BMI) 29.0-29.9, adult: Secondary | ICD-10-CM | POA: Diagnosis not present

## 2015-02-27 DIAGNOSIS — I87319 Chronic venous hypertension (idiopathic) with ulcer of unspecified lower extremity: Secondary | ICD-10-CM | POA: Diagnosis not present

## 2015-02-27 DIAGNOSIS — I872 Venous insufficiency (chronic) (peripheral): Secondary | ICD-10-CM | POA: Diagnosis not present

## 2015-03-02 DIAGNOSIS — Z87891 Personal history of nicotine dependence: Secondary | ICD-10-CM | POA: Diagnosis not present

## 2015-03-02 DIAGNOSIS — I87312 Chronic venous hypertension (idiopathic) with ulcer of left lower extremity: Secondary | ICD-10-CM | POA: Diagnosis not present

## 2015-03-02 DIAGNOSIS — H903 Sensorineural hearing loss, bilateral: Secondary | ICD-10-CM | POA: Diagnosis not present

## 2015-03-02 DIAGNOSIS — L97829 Non-pressure chronic ulcer of other part of left lower leg with unspecified severity: Secondary | ICD-10-CM | POA: Diagnosis not present

## 2015-03-02 DIAGNOSIS — I10 Essential (primary) hypertension: Secondary | ICD-10-CM | POA: Diagnosis not present

## 2015-03-02 DIAGNOSIS — I4891 Unspecified atrial fibrillation: Secondary | ICD-10-CM | POA: Diagnosis not present

## 2015-03-05 DIAGNOSIS — I4891 Unspecified atrial fibrillation: Secondary | ICD-10-CM | POA: Diagnosis not present

## 2015-03-05 DIAGNOSIS — Z87891 Personal history of nicotine dependence: Secondary | ICD-10-CM | POA: Diagnosis not present

## 2015-03-05 DIAGNOSIS — I10 Essential (primary) hypertension: Secondary | ICD-10-CM | POA: Diagnosis not present

## 2015-03-05 DIAGNOSIS — H903 Sensorineural hearing loss, bilateral: Secondary | ICD-10-CM | POA: Diagnosis not present

## 2015-03-05 DIAGNOSIS — I87312 Chronic venous hypertension (idiopathic) with ulcer of left lower extremity: Secondary | ICD-10-CM | POA: Diagnosis not present

## 2015-03-05 DIAGNOSIS — L97829 Non-pressure chronic ulcer of other part of left lower leg with unspecified severity: Secondary | ICD-10-CM | POA: Diagnosis not present

## 2015-03-08 DIAGNOSIS — L97829 Non-pressure chronic ulcer of other part of left lower leg with unspecified severity: Secondary | ICD-10-CM | POA: Diagnosis not present

## 2015-03-08 DIAGNOSIS — Z87891 Personal history of nicotine dependence: Secondary | ICD-10-CM | POA: Diagnosis not present

## 2015-03-08 DIAGNOSIS — H903 Sensorineural hearing loss, bilateral: Secondary | ICD-10-CM | POA: Diagnosis not present

## 2015-03-08 DIAGNOSIS — I4891 Unspecified atrial fibrillation: Secondary | ICD-10-CM | POA: Diagnosis not present

## 2015-03-08 DIAGNOSIS — I10 Essential (primary) hypertension: Secondary | ICD-10-CM | POA: Diagnosis not present

## 2015-03-08 DIAGNOSIS — I87312 Chronic venous hypertension (idiopathic) with ulcer of left lower extremity: Secondary | ICD-10-CM | POA: Diagnosis not present

## 2015-03-11 DIAGNOSIS — L97829 Non-pressure chronic ulcer of other part of left lower leg with unspecified severity: Secondary | ICD-10-CM | POA: Diagnosis not present

## 2015-03-11 DIAGNOSIS — H903 Sensorineural hearing loss, bilateral: Secondary | ICD-10-CM | POA: Diagnosis not present

## 2015-03-11 DIAGNOSIS — Z87891 Personal history of nicotine dependence: Secondary | ICD-10-CM | POA: Diagnosis not present

## 2015-03-11 DIAGNOSIS — I10 Essential (primary) hypertension: Secondary | ICD-10-CM | POA: Diagnosis not present

## 2015-03-11 DIAGNOSIS — I87312 Chronic venous hypertension (idiopathic) with ulcer of left lower extremity: Secondary | ICD-10-CM | POA: Diagnosis not present

## 2015-03-11 DIAGNOSIS — I4891 Unspecified atrial fibrillation: Secondary | ICD-10-CM | POA: Diagnosis not present

## 2015-03-14 DIAGNOSIS — Z87891 Personal history of nicotine dependence: Secondary | ICD-10-CM | POA: Diagnosis not present

## 2015-03-14 DIAGNOSIS — I4891 Unspecified atrial fibrillation: Secondary | ICD-10-CM | POA: Diagnosis not present

## 2015-03-14 DIAGNOSIS — I87312 Chronic venous hypertension (idiopathic) with ulcer of left lower extremity: Secondary | ICD-10-CM | POA: Diagnosis not present

## 2015-03-14 DIAGNOSIS — I10 Essential (primary) hypertension: Secondary | ICD-10-CM | POA: Diagnosis not present

## 2015-03-14 DIAGNOSIS — H903 Sensorineural hearing loss, bilateral: Secondary | ICD-10-CM | POA: Diagnosis not present

## 2015-03-14 DIAGNOSIS — L97829 Non-pressure chronic ulcer of other part of left lower leg with unspecified severity: Secondary | ICD-10-CM | POA: Diagnosis not present

## 2015-03-18 DIAGNOSIS — Z87891 Personal history of nicotine dependence: Secondary | ICD-10-CM | POA: Diagnosis not present

## 2015-03-18 DIAGNOSIS — I10 Essential (primary) hypertension: Secondary | ICD-10-CM | POA: Diagnosis not present

## 2015-03-18 DIAGNOSIS — H903 Sensorineural hearing loss, bilateral: Secondary | ICD-10-CM | POA: Diagnosis not present

## 2015-03-18 DIAGNOSIS — L97829 Non-pressure chronic ulcer of other part of left lower leg with unspecified severity: Secondary | ICD-10-CM | POA: Diagnosis not present

## 2015-03-18 DIAGNOSIS — I87312 Chronic venous hypertension (idiopathic) with ulcer of left lower extremity: Secondary | ICD-10-CM | POA: Diagnosis not present

## 2015-03-18 DIAGNOSIS — I4891 Unspecified atrial fibrillation: Secondary | ICD-10-CM | POA: Diagnosis not present

## 2015-03-21 DIAGNOSIS — H903 Sensorineural hearing loss, bilateral: Secondary | ICD-10-CM | POA: Diagnosis not present

## 2015-03-21 DIAGNOSIS — I4891 Unspecified atrial fibrillation: Secondary | ICD-10-CM | POA: Diagnosis not present

## 2015-03-21 DIAGNOSIS — L97829 Non-pressure chronic ulcer of other part of left lower leg with unspecified severity: Secondary | ICD-10-CM | POA: Diagnosis not present

## 2015-03-21 DIAGNOSIS — I87312 Chronic venous hypertension (idiopathic) with ulcer of left lower extremity: Secondary | ICD-10-CM | POA: Diagnosis not present

## 2015-03-21 DIAGNOSIS — Z87891 Personal history of nicotine dependence: Secondary | ICD-10-CM | POA: Diagnosis not present

## 2015-03-21 DIAGNOSIS — I10 Essential (primary) hypertension: Secondary | ICD-10-CM | POA: Diagnosis not present

## 2015-03-25 ENCOUNTER — Encounter: Payer: Self-pay | Admitting: Podiatry

## 2015-03-25 ENCOUNTER — Ambulatory Visit (INDEPENDENT_AMBULATORY_CARE_PROVIDER_SITE_OTHER): Payer: Medicare Other | Admitting: Podiatry

## 2015-03-25 VITALS — BP 123/71 | HR 55 | Resp 16

## 2015-03-25 DIAGNOSIS — M79676 Pain in unspecified toe(s): Secondary | ICD-10-CM

## 2015-03-25 DIAGNOSIS — B351 Tinea unguium: Secondary | ICD-10-CM

## 2015-03-25 NOTE — Progress Notes (Signed)
   Subjective:    Patient ID: Lori [name], female    DOB: December 27, 1918, 81 y.o.   MRN: [id number]  HPI 81 year old phenol presents the office they with concerns of thick, painful, elongated toenails for which she is unable to herself. She denies any redness or drainage from the toenails. She says the nails become painful particularly with pressure in shoe gear. No other complaints at this time.   Review of Systems  Eyes: Positive for visual disturbance.  Musculoskeletal: Positive for gait problem.       Joint pain  All other systems reviewed and are negative.      Objective:   Physical Exam AAO 3, NAD DP/PT pulses palpable, CRT less than 3 seconds Protective sensation intact with Simms Weinstein monofilament, vibratory sensation intact Nails are hypertrophic, dystrophic, brittle, discolored, elongated 10. There is tenderness palpation overlying nails 1-5 bilaterally. There is no surrounding erythema or drainage. No open lesions or pre-ulcer lesions identified bilaterally. There is a wrap/bandage on the left leg that she does not want removed. No other areas of tenderness to bilateral lower extremity is. There appears to be chronic b edema bilaterally. Varicose veins are present on the right side. There is no pain with calf compression, swelling, warmth, erythema.       Assessment & Plan:  81 year old female with symptomatic onychomycosis -Treatment options discussed including all alternatives, risks, and complications -Nails sharply debrided 10 without complication/bleeding -Discussed importance of daily foot inspection -There is a dressing on the left leg. Will defer to her other treating physician. -Follow-up in 3 months or sooner if any problems arise. In the meantime, encouraged to call the office with any questions, concerns, change in symptoms.   Celesta Gentile, DPM

## 2015-03-26 DIAGNOSIS — Z87891 Personal history of nicotine dependence: Secondary | ICD-10-CM | POA: Diagnosis not present

## 2015-03-26 DIAGNOSIS — L97829 Non-pressure chronic ulcer of other part of left lower leg with unspecified severity: Secondary | ICD-10-CM | POA: Diagnosis not present

## 2015-03-26 DIAGNOSIS — I87312 Chronic venous hypertension (idiopathic) with ulcer of left lower extremity: Secondary | ICD-10-CM | POA: Diagnosis not present

## 2015-03-26 DIAGNOSIS — H903 Sensorineural hearing loss, bilateral: Secondary | ICD-10-CM | POA: Diagnosis not present

## 2015-03-26 DIAGNOSIS — I4891 Unspecified atrial fibrillation: Secondary | ICD-10-CM | POA: Diagnosis not present

## 2015-03-26 DIAGNOSIS — I10 Essential (primary) hypertension: Secondary | ICD-10-CM | POA: Diagnosis not present

## 2015-03-28 DIAGNOSIS — H3532 Exudative age-related macular degeneration: Secondary | ICD-10-CM | POA: Diagnosis not present

## 2015-03-28 DIAGNOSIS — H31011 Macula scars of posterior pole (postinflammatory) (post-traumatic), right eye: Secondary | ICD-10-CM | POA: Diagnosis not present

## 2015-03-28 DIAGNOSIS — Z961 Presence of intraocular lens: Secondary | ICD-10-CM | POA: Diagnosis not present

## 2015-04-04 DIAGNOSIS — I87312 Chronic venous hypertension (idiopathic) with ulcer of left lower extremity: Secondary | ICD-10-CM | POA: Diagnosis not present

## 2015-04-04 DIAGNOSIS — I10 Essential (primary) hypertension: Secondary | ICD-10-CM | POA: Diagnosis not present

## 2015-04-04 DIAGNOSIS — Z87891 Personal history of nicotine dependence: Secondary | ICD-10-CM | POA: Diagnosis not present

## 2015-04-04 DIAGNOSIS — H903 Sensorineural hearing loss, bilateral: Secondary | ICD-10-CM | POA: Diagnosis not present

## 2015-04-04 DIAGNOSIS — L97829 Non-pressure chronic ulcer of other part of left lower leg with unspecified severity: Secondary | ICD-10-CM | POA: Diagnosis not present

## 2015-04-04 DIAGNOSIS — I4891 Unspecified atrial fibrillation: Secondary | ICD-10-CM | POA: Diagnosis not present

## 2015-05-14 DIAGNOSIS — H353221 Exudative age-related macular degeneration, left eye, with active choroidal neovascularization: Secondary | ICD-10-CM | POA: Diagnosis not present

## 2015-05-21 ENCOUNTER — Other Ambulatory Visit: Payer: Self-pay | Admitting: Internal Medicine

## 2015-05-23 ENCOUNTER — Other Ambulatory Visit: Payer: Self-pay | Admitting: Internal Medicine

## 2015-05-27 DIAGNOSIS — Z85828 Personal history of other malignant neoplasm of skin: Secondary | ICD-10-CM | POA: Diagnosis not present

## 2015-05-27 DIAGNOSIS — C44722 Squamous cell carcinoma of skin of right lower limb, including hip: Secondary | ICD-10-CM | POA: Diagnosis not present

## 2015-05-27 DIAGNOSIS — L820 Inflamed seborrheic keratosis: Secondary | ICD-10-CM | POA: Diagnosis not present

## 2015-05-27 DIAGNOSIS — D485 Neoplasm of uncertain behavior of skin: Secondary | ICD-10-CM | POA: Diagnosis not present

## 2015-05-28 DIAGNOSIS — H6123 Impacted cerumen, bilateral: Secondary | ICD-10-CM | POA: Diagnosis not present

## 2015-05-28 DIAGNOSIS — H9113 Presbycusis, bilateral: Secondary | ICD-10-CM | POA: Diagnosis not present

## 2015-05-28 DIAGNOSIS — Z974 Presence of external hearing-aid: Secondary | ICD-10-CM | POA: Diagnosis not present

## 2015-06-04 DIAGNOSIS — I872 Venous insufficiency (chronic) (peripheral): Secondary | ICD-10-CM | POA: Diagnosis not present

## 2015-06-04 DIAGNOSIS — I8312 Varicose veins of left lower extremity with inflammation: Secondary | ICD-10-CM | POA: Diagnosis not present

## 2015-06-04 DIAGNOSIS — I8311 Varicose veins of right lower extremity with inflammation: Secondary | ICD-10-CM | POA: Diagnosis not present

## 2015-06-04 DIAGNOSIS — Z85828 Personal history of other malignant neoplasm of skin: Secondary | ICD-10-CM | POA: Diagnosis not present

## 2015-06-05 DIAGNOSIS — Z483 Aftercare following surgery for neoplasm: Secondary | ICD-10-CM | POA: Diagnosis not present

## 2015-06-05 DIAGNOSIS — I890 Lymphedema, not elsewhere classified: Secondary | ICD-10-CM | POA: Diagnosis not present

## 2015-06-05 DIAGNOSIS — L988 Other specified disorders of the skin and subcutaneous tissue: Secondary | ICD-10-CM | POA: Diagnosis not present

## 2015-06-05 DIAGNOSIS — I519 Heart disease, unspecified: Secondary | ICD-10-CM | POA: Diagnosis not present

## 2015-06-05 DIAGNOSIS — J45909 Unspecified asthma, uncomplicated: Secondary | ICD-10-CM | POA: Diagnosis not present

## 2015-06-05 DIAGNOSIS — I872 Venous insufficiency (chronic) (peripheral): Secondary | ICD-10-CM | POA: Diagnosis not present

## 2015-06-06 DIAGNOSIS — I872 Venous insufficiency (chronic) (peripheral): Secondary | ICD-10-CM | POA: Diagnosis not present

## 2015-06-06 DIAGNOSIS — J45909 Unspecified asthma, uncomplicated: Secondary | ICD-10-CM | POA: Diagnosis not present

## 2015-06-06 DIAGNOSIS — I890 Lymphedema, not elsewhere classified: Secondary | ICD-10-CM | POA: Diagnosis not present

## 2015-06-06 DIAGNOSIS — L988 Other specified disorders of the skin and subcutaneous tissue: Secondary | ICD-10-CM | POA: Diagnosis not present

## 2015-06-06 DIAGNOSIS — I519 Heart disease, unspecified: Secondary | ICD-10-CM | POA: Diagnosis not present

## 2015-06-06 DIAGNOSIS — Z483 Aftercare following surgery for neoplasm: Secondary | ICD-10-CM | POA: Diagnosis not present

## 2015-06-07 DIAGNOSIS — Z483 Aftercare following surgery for neoplasm: Secondary | ICD-10-CM | POA: Diagnosis not present

## 2015-06-07 DIAGNOSIS — J45909 Unspecified asthma, uncomplicated: Secondary | ICD-10-CM | POA: Diagnosis not present

## 2015-06-07 DIAGNOSIS — I890 Lymphedema, not elsewhere classified: Secondary | ICD-10-CM | POA: Diagnosis not present

## 2015-06-07 DIAGNOSIS — I872 Venous insufficiency (chronic) (peripheral): Secondary | ICD-10-CM | POA: Diagnosis not present

## 2015-06-07 DIAGNOSIS — L988 Other specified disorders of the skin and subcutaneous tissue: Secondary | ICD-10-CM | POA: Diagnosis not present

## 2015-06-07 DIAGNOSIS — I519 Heart disease, unspecified: Secondary | ICD-10-CM | POA: Diagnosis not present

## 2015-06-10 DIAGNOSIS — J45909 Unspecified asthma, uncomplicated: Secondary | ICD-10-CM | POA: Diagnosis not present

## 2015-06-10 DIAGNOSIS — I890 Lymphedema, not elsewhere classified: Secondary | ICD-10-CM | POA: Diagnosis not present

## 2015-06-10 DIAGNOSIS — I872 Venous insufficiency (chronic) (peripheral): Secondary | ICD-10-CM | POA: Diagnosis not present

## 2015-06-10 DIAGNOSIS — Z483 Aftercare following surgery for neoplasm: Secondary | ICD-10-CM | POA: Diagnosis not present

## 2015-06-10 DIAGNOSIS — I519 Heart disease, unspecified: Secondary | ICD-10-CM | POA: Diagnosis not present

## 2015-06-10 DIAGNOSIS — L988 Other specified disorders of the skin and subcutaneous tissue: Secondary | ICD-10-CM | POA: Diagnosis not present

## 2015-06-12 ENCOUNTER — Ambulatory Visit: Payer: Commercial Indemnity | Admitting: Internal Medicine

## 2015-06-13 DIAGNOSIS — L988 Other specified disorders of the skin and subcutaneous tissue: Secondary | ICD-10-CM | POA: Diagnosis not present

## 2015-06-13 DIAGNOSIS — I872 Venous insufficiency (chronic) (peripheral): Secondary | ICD-10-CM | POA: Diagnosis not present

## 2015-06-13 DIAGNOSIS — J45909 Unspecified asthma, uncomplicated: Secondary | ICD-10-CM | POA: Diagnosis not present

## 2015-06-13 DIAGNOSIS — I890 Lymphedema, not elsewhere classified: Secondary | ICD-10-CM | POA: Diagnosis not present

## 2015-06-13 DIAGNOSIS — I519 Heart disease, unspecified: Secondary | ICD-10-CM | POA: Diagnosis not present

## 2015-06-13 DIAGNOSIS — Z483 Aftercare following surgery for neoplasm: Secondary | ICD-10-CM | POA: Diagnosis not present

## 2015-06-17 DIAGNOSIS — Z483 Aftercare following surgery for neoplasm: Secondary | ICD-10-CM | POA: Diagnosis not present

## 2015-06-17 DIAGNOSIS — L988 Other specified disorders of the skin and subcutaneous tissue: Secondary | ICD-10-CM | POA: Diagnosis not present

## 2015-06-17 DIAGNOSIS — I519 Heart disease, unspecified: Secondary | ICD-10-CM | POA: Diagnosis not present

## 2015-06-17 DIAGNOSIS — I890 Lymphedema, not elsewhere classified: Secondary | ICD-10-CM | POA: Diagnosis not present

## 2015-06-17 DIAGNOSIS — I872 Venous insufficiency (chronic) (peripheral): Secondary | ICD-10-CM | POA: Diagnosis not present

## 2015-06-17 DIAGNOSIS — J45909 Unspecified asthma, uncomplicated: Secondary | ICD-10-CM | POA: Diagnosis not present

## 2015-06-20 DIAGNOSIS — I890 Lymphedema, not elsewhere classified: Secondary | ICD-10-CM | POA: Diagnosis not present

## 2015-06-20 DIAGNOSIS — I519 Heart disease, unspecified: Secondary | ICD-10-CM | POA: Diagnosis not present

## 2015-06-20 DIAGNOSIS — Z483 Aftercare following surgery for neoplasm: Secondary | ICD-10-CM | POA: Diagnosis not present

## 2015-06-20 DIAGNOSIS — J45909 Unspecified asthma, uncomplicated: Secondary | ICD-10-CM | POA: Diagnosis not present

## 2015-06-20 DIAGNOSIS — L988 Other specified disorders of the skin and subcutaneous tissue: Secondary | ICD-10-CM | POA: Diagnosis not present

## 2015-06-20 DIAGNOSIS — I872 Venous insufficiency (chronic) (peripheral): Secondary | ICD-10-CM | POA: Diagnosis not present

## 2015-06-24 ENCOUNTER — Ambulatory Visit (INDEPENDENT_AMBULATORY_CARE_PROVIDER_SITE_OTHER): Payer: Medicare Other | Admitting: Podiatry

## 2015-06-24 DIAGNOSIS — M79676 Pain in unspecified toe(s): Secondary | ICD-10-CM | POA: Diagnosis not present

## 2015-06-24 DIAGNOSIS — B351 Tinea unguium: Secondary | ICD-10-CM

## 2015-06-24 DIAGNOSIS — I872 Venous insufficiency (chronic) (peripheral): Secondary | ICD-10-CM | POA: Diagnosis not present

## 2015-06-24 DIAGNOSIS — L988 Other specified disorders of the skin and subcutaneous tissue: Secondary | ICD-10-CM | POA: Diagnosis not present

## 2015-06-24 DIAGNOSIS — Z483 Aftercare following surgery for neoplasm: Secondary | ICD-10-CM | POA: Diagnosis not present

## 2015-06-24 DIAGNOSIS — I519 Heart disease, unspecified: Secondary | ICD-10-CM | POA: Diagnosis not present

## 2015-06-24 DIAGNOSIS — I890 Lymphedema, not elsewhere classified: Secondary | ICD-10-CM | POA: Diagnosis not present

## 2015-06-24 DIAGNOSIS — J45909 Unspecified asthma, uncomplicated: Secondary | ICD-10-CM | POA: Diagnosis not present

## 2015-06-25 NOTE — Progress Notes (Signed)
Patient ID: Lori [name], female   DOB: 11-18-1918, 81 y.o.   MRN: [id number]  Subjective: 81 y.o. returns the office today for painful, elongated, thickened toenails which she cannot trim herself. Denies any redness or drainage around the nails. Denies any acute changes since last appointment and no new complaints today. Denies any systemic complaints such as fevers, chills, nausea, vomiting.   Objective: AAO 3, NAD DP/PT pulses palpable, CRT less than 3 seconds Protective sensation intact with Simms Weinstein monofilament Nails hypertrophic, dystrophic, elongated, brittle, discolored 10. There is tenderness overlying the nails 1-5 bilaterally. There is no surrounding erythema or drainage along the nail sites. No open lesions or pre-ulcerative lesions are identified. Bandage on the right leg that she has a nurse change a few times a week. She does not want it removed and she is currently seeing another physician for this issue.  No other areas of tenderness bilateral lower extremities. No overlying edema, erythema, increased warmth. No pain with calf compression, swelling, warmth, erythema.  Assessment: Patient presents with symptomatic onychomycosis  Plan: -Treatment options including alternatives, risks, complications were discussed -Nails sharply debrided 10 without complication/bleeding. -Discussed daily foot inspection. If there are any changes, to call the office immediately.  -Follow-up in 3 months or sooner if any problems are to arise. In the meantime, encouraged to call the office with any questions, concerns, changes symptoms.  Celesta Gentile, DPM

## 2015-06-27 DIAGNOSIS — H401121 Primary open-angle glaucoma, left eye, mild stage: Secondary | ICD-10-CM | POA: Diagnosis not present

## 2015-06-27 DIAGNOSIS — H353213 Exudative age-related macular degeneration, right eye, with inactive scar: Secondary | ICD-10-CM | POA: Diagnosis not present

## 2015-06-27 DIAGNOSIS — Z961 Presence of intraocular lens: Secondary | ICD-10-CM | POA: Diagnosis not present

## 2015-06-27 DIAGNOSIS — H353221 Exudative age-related macular degeneration, left eye, with active choroidal neovascularization: Secondary | ICD-10-CM | POA: Diagnosis not present

## 2015-06-27 DIAGNOSIS — H31011 Macula scars of posterior pole (postinflammatory) (post-traumatic), right eye: Secondary | ICD-10-CM | POA: Diagnosis not present

## 2015-06-28 DIAGNOSIS — Z483 Aftercare following surgery for neoplasm: Secondary | ICD-10-CM | POA: Diagnosis not present

## 2015-06-28 DIAGNOSIS — J45909 Unspecified asthma, uncomplicated: Secondary | ICD-10-CM | POA: Diagnosis not present

## 2015-06-28 DIAGNOSIS — L988 Other specified disorders of the skin and subcutaneous tissue: Secondary | ICD-10-CM | POA: Diagnosis not present

## 2015-06-28 DIAGNOSIS — I519 Heart disease, unspecified: Secondary | ICD-10-CM | POA: Diagnosis not present

## 2015-06-28 DIAGNOSIS — I890 Lymphedema, not elsewhere classified: Secondary | ICD-10-CM | POA: Diagnosis not present

## 2015-06-28 DIAGNOSIS — M1711 Unilateral primary osteoarthritis, right knee: Secondary | ICD-10-CM | POA: Diagnosis not present

## 2015-06-28 DIAGNOSIS — I872 Venous insufficiency (chronic) (peripheral): Secondary | ICD-10-CM | POA: Diagnosis not present

## 2015-07-01 DIAGNOSIS — I872 Venous insufficiency (chronic) (peripheral): Secondary | ICD-10-CM | POA: Diagnosis not present

## 2015-07-01 DIAGNOSIS — J45909 Unspecified asthma, uncomplicated: Secondary | ICD-10-CM | POA: Diagnosis not present

## 2015-07-01 DIAGNOSIS — I890 Lymphedema, not elsewhere classified: Secondary | ICD-10-CM | POA: Diagnosis not present

## 2015-07-01 DIAGNOSIS — L988 Other specified disorders of the skin and subcutaneous tissue: Secondary | ICD-10-CM | POA: Diagnosis not present

## 2015-07-01 DIAGNOSIS — I519 Heart disease, unspecified: Secondary | ICD-10-CM | POA: Diagnosis not present

## 2015-07-01 DIAGNOSIS — Z483 Aftercare following surgery for neoplasm: Secondary | ICD-10-CM | POA: Diagnosis not present

## 2015-07-04 DIAGNOSIS — Z483 Aftercare following surgery for neoplasm: Secondary | ICD-10-CM | POA: Diagnosis not present

## 2015-07-04 DIAGNOSIS — I890 Lymphedema, not elsewhere classified: Secondary | ICD-10-CM | POA: Diagnosis not present

## 2015-07-04 DIAGNOSIS — I519 Heart disease, unspecified: Secondary | ICD-10-CM | POA: Diagnosis not present

## 2015-07-04 DIAGNOSIS — J45909 Unspecified asthma, uncomplicated: Secondary | ICD-10-CM | POA: Diagnosis not present

## 2015-07-04 DIAGNOSIS — I872 Venous insufficiency (chronic) (peripheral): Secondary | ICD-10-CM | POA: Diagnosis not present

## 2015-07-04 DIAGNOSIS — L988 Other specified disorders of the skin and subcutaneous tissue: Secondary | ICD-10-CM | POA: Diagnosis not present

## 2015-07-08 ENCOUNTER — Other Ambulatory Visit: Payer: Self-pay | Admitting: Internal Medicine

## 2015-07-09 ENCOUNTER — Other Ambulatory Visit: Payer: Self-pay | Admitting: *Deleted

## 2015-07-09 DIAGNOSIS — I10 Essential (primary) hypertension: Secondary | ICD-10-CM

## 2015-07-09 DIAGNOSIS — I890 Lymphedema, not elsewhere classified: Secondary | ICD-10-CM | POA: Diagnosis not present

## 2015-07-09 DIAGNOSIS — L988 Other specified disorders of the skin and subcutaneous tissue: Secondary | ICD-10-CM | POA: Diagnosis not present

## 2015-07-09 DIAGNOSIS — I872 Venous insufficiency (chronic) (peripheral): Secondary | ICD-10-CM | POA: Diagnosis not present

## 2015-07-09 DIAGNOSIS — J45909 Unspecified asthma, uncomplicated: Secondary | ICD-10-CM | POA: Diagnosis not present

## 2015-07-09 DIAGNOSIS — I519 Heart disease, unspecified: Secondary | ICD-10-CM | POA: Diagnosis not present

## 2015-07-09 DIAGNOSIS — Z483 Aftercare following surgery for neoplasm: Secondary | ICD-10-CM | POA: Diagnosis not present

## 2015-07-09 MED ORDER — METOPROLOL TARTRATE 25 MG PO TABS: 25.0000 mg | ORAL_TABLET | Freq: Two times a day (BID) | ORAL | Status: DC

## 2015-07-11 DIAGNOSIS — I872 Venous insufficiency (chronic) (peripheral): Secondary | ICD-10-CM | POA: Diagnosis not present

## 2015-07-11 DIAGNOSIS — L988 Other specified disorders of the skin and subcutaneous tissue: Secondary | ICD-10-CM | POA: Diagnosis not present

## 2015-07-11 DIAGNOSIS — Z483 Aftercare following surgery for neoplasm: Secondary | ICD-10-CM | POA: Diagnosis not present

## 2015-07-11 DIAGNOSIS — I890 Lymphedema, not elsewhere classified: Secondary | ICD-10-CM | POA: Diagnosis not present

## 2015-07-11 DIAGNOSIS — I519 Heart disease, unspecified: Secondary | ICD-10-CM | POA: Diagnosis not present

## 2015-07-11 DIAGNOSIS — J45909 Unspecified asthma, uncomplicated: Secondary | ICD-10-CM | POA: Diagnosis not present

## 2015-07-16 ENCOUNTER — Encounter: Payer: Self-pay | Admitting: Internal Medicine

## 2015-07-16 DIAGNOSIS — I872 Venous insufficiency (chronic) (peripheral): Secondary | ICD-10-CM | POA: Diagnosis not present

## 2015-07-16 DIAGNOSIS — I890 Lymphedema, not elsewhere classified: Secondary | ICD-10-CM | POA: Diagnosis not present

## 2015-07-16 DIAGNOSIS — J45909 Unspecified asthma, uncomplicated: Secondary | ICD-10-CM | POA: Diagnosis not present

## 2015-07-16 DIAGNOSIS — I519 Heart disease, unspecified: Secondary | ICD-10-CM | POA: Diagnosis not present

## 2015-07-16 DIAGNOSIS — L988 Other specified disorders of the skin and subcutaneous tissue: Secondary | ICD-10-CM | POA: Diagnosis not present

## 2015-07-16 DIAGNOSIS — Z483 Aftercare following surgery for neoplasm: Secondary | ICD-10-CM | POA: Diagnosis not present

## 2015-07-19 DIAGNOSIS — I872 Venous insufficiency (chronic) (peripheral): Secondary | ICD-10-CM | POA: Diagnosis not present

## 2015-07-19 DIAGNOSIS — I890 Lymphedema, not elsewhere classified: Secondary | ICD-10-CM | POA: Diagnosis not present

## 2015-07-19 DIAGNOSIS — I519 Heart disease, unspecified: Secondary | ICD-10-CM | POA: Diagnosis not present

## 2015-07-19 DIAGNOSIS — Z483 Aftercare following surgery for neoplasm: Secondary | ICD-10-CM | POA: Diagnosis not present

## 2015-07-19 DIAGNOSIS — J45909 Unspecified asthma, uncomplicated: Secondary | ICD-10-CM | POA: Diagnosis not present

## 2015-07-19 DIAGNOSIS — L988 Other specified disorders of the skin and subcutaneous tissue: Secondary | ICD-10-CM | POA: Diagnosis not present

## 2015-07-23 ENCOUNTER — Ambulatory Visit (INDEPENDENT_AMBULATORY_CARE_PROVIDER_SITE_OTHER): Payer: Medicare Other | Admitting: Internal Medicine

## 2015-07-23 ENCOUNTER — Encounter: Payer: Self-pay | Admitting: Internal Medicine

## 2015-07-23 VITALS — BP 112/58 | HR 81 | Ht 64.0 in | Wt 181.0 lb

## 2015-07-23 DIAGNOSIS — I482 Chronic atrial fibrillation, unspecified: Secondary | ICD-10-CM

## 2015-07-23 DIAGNOSIS — I10 Essential (primary) hypertension: Secondary | ICD-10-CM

## 2015-07-23 NOTE — Assessment & Plan Note (Signed)
Her ventricular rate is controlled. She will continue her current meds.

## 2015-07-23 NOTE — Progress Notes (Signed)
HPI Mrs. Gammill returns today for followup. She is a 82 year old woman with a history of chronic atrial fibrillation, hypertension, and venous insufficiency. In the interim she has done well. She denies chest pain , or syncope. She has class II dyspnea. She has chronic lower extremity edema. She denies palpitations. Allergies  Allergen Reactions  . Dust Mite Extract     Unknown  . Latex Itching    Unknown Unknown  . Mold Extract [Trichophyton Mentagrophyte]     Unknown  . Other Other (See Comments)    Unknown     Current Outpatient Prescriptions  Medication Sig Dispense Refill  . dabigatran (PRADAXA) 75 MG CAPS capsule Take 1 capsule (75 mg total) by mouth every 12 (twelve) hours. 180 capsule 0  . fluocinonide cream (LIDEX) 0.05 % APPLY TO LEGS TWICE A DAY  3  . furosemide (LASIX) 20 MG tablet Take 2 tablets (40 mg total) by mouth daily. 60 tablet 12  . metoprolol tartrate (LOPRESSOR) 25 MG tablet Take 1 tablet (25 mg total) by mouth 2 (two) times daily. 30 tablet 0  . Multiple Vitamin (MULITIVITAMIN WITH MINERALS) TABS Take 1 tablet by mouth at bedtime.     . Multiple Vitamins-Minerals (ICAPS MV PO) Take 1 capsule by mouth 2 (two) times daily.     Marland Kitchen omeprazole (PRILOSEC) 40 MG capsule Take 40 mg by mouth daily.    . Probiotic Product (ALIGN PO) Take 1 capsule by mouth every morning.    Marland Kitchen spironolactone (ALDACTONE) 25 MG tablet Take 1 tablet (25 mg total) by mouth 3 (three) times a week. Monday Wednesday Friday 36 tablet 3  . timolol (TIMOPTIC) 0.5 % ophthalmic solution Place 1 drop into the left eye 2 (two) times daily.  12  . traMADol (ULTRAM) 50 MG tablet Take 1 tablet by mouth 3 (three) times daily as needed. Pain  3  . verapamil (VERELAN PM) 120 MG 24 hr capsule Take 1 capsule (120 mg total) by mouth 2 (two) times daily. 180 capsule 0  . vitamin C (ASCORBIC ACID) 500 MG tablet Take 500 mg by mouth 2 (two) times daily.    . [DISCONTINUED] triamterene-hydrochlorothiazide  (MAXZIDE-25) 37.5-25 MG per tablet Take 1 tablet by mouth daily.     No current facility-administered medications for this visit.     Past Medical History  Diagnosis Date  . Chronic venous insufficiency   . Cellulitis   . Chronic acquired lymphedema   . Asthma   . Hyperlipidemia   . Hypertension   . Dysrhythmia   . Anemia     ROS:   All systems reviewed and negative except as noted in the HPI.   Past Surgical History  Procedure Laterality Date  . Left total knee replacement    . Right total hip replacement    . Joint replacement       Family History  Problem Relation Age of Onset  . Other Father     unknown causes  . Lung cancer Mother   . Breast cancer Neg Hx   . Diabetes Neg Hx      Social History   Social History  . Marital Status: Widowed    Spouse Name: N/A  . Number of Children: N/A  . Years of Education: N/A   Occupational History  . Not on file.   Social History Main Topics  . Smoking status: Former Smoker    Quit date: 07/28/1959  . Smokeless tobacco: Not on file  .  Alcohol Use: No  . Drug Use: No  . Sexual Activity: Not on file   Other Topics Concern  . Not on file   Social History Narrative     BP 112/58 mmHg  Pulse 81  Ht 5\' 4"  (1.626 m)  Wt 181 lb (82.101 kg)  BMI 31.05 kg/m2  Physical Exam:  Well appearing elderly woman,NAD HEENT: Unremarkable Neck:  No JVD, no thyromegally Back:  No CVA tenderness Lungs:  Clear with no wheezes HEART:  IRegular rate rhythm, no murmurs, no rubs, no clicks Abd:  soft, positive bowel sounds, no organomegally, no rebound, no guarding Ext:  2 plus pulses, 2+ peripheral edema, no cyanosis, no clubbing Skin:  No rashes no nodules Neuro:  CN II through XII intact, motor grossly intact  EKG Atrial fibrillation with a controlled ventricular response.  Assess/Plan:

## 2015-07-23 NOTE — Patient Instructions (Signed)

## 2015-07-23 NOTE — Assessment & Plan Note (Signed)
Her blood pressure is well controlled. No change in meds. 

## 2015-07-24 DIAGNOSIS — Z483 Aftercare following surgery for neoplasm: Secondary | ICD-10-CM | POA: Diagnosis not present

## 2015-07-24 DIAGNOSIS — J45909 Unspecified asthma, uncomplicated: Secondary | ICD-10-CM | POA: Diagnosis not present

## 2015-07-24 DIAGNOSIS — I890 Lymphedema, not elsewhere classified: Secondary | ICD-10-CM | POA: Diagnosis not present

## 2015-07-24 DIAGNOSIS — I519 Heart disease, unspecified: Secondary | ICD-10-CM | POA: Diagnosis not present

## 2015-07-24 DIAGNOSIS — L988 Other specified disorders of the skin and subcutaneous tissue: Secondary | ICD-10-CM | POA: Diagnosis not present

## 2015-07-24 DIAGNOSIS — I872 Venous insufficiency (chronic) (peripheral): Secondary | ICD-10-CM | POA: Diagnosis not present

## 2015-07-26 DIAGNOSIS — L988 Other specified disorders of the skin and subcutaneous tissue: Secondary | ICD-10-CM | POA: Diagnosis not present

## 2015-07-26 DIAGNOSIS — I872 Venous insufficiency (chronic) (peripheral): Secondary | ICD-10-CM | POA: Diagnosis not present

## 2015-07-26 DIAGNOSIS — I890 Lymphedema, not elsewhere classified: Secondary | ICD-10-CM | POA: Diagnosis not present

## 2015-07-26 DIAGNOSIS — Z483 Aftercare following surgery for neoplasm: Secondary | ICD-10-CM | POA: Diagnosis not present

## 2015-07-26 DIAGNOSIS — I519 Heart disease, unspecified: Secondary | ICD-10-CM | POA: Diagnosis not present

## 2015-07-26 DIAGNOSIS — J45909 Unspecified asthma, uncomplicated: Secondary | ICD-10-CM | POA: Diagnosis not present

## 2015-07-29 ENCOUNTER — Other Ambulatory Visit: Payer: Self-pay | Admitting: Internal Medicine

## 2015-07-29 DIAGNOSIS — H6123 Impacted cerumen, bilateral: Secondary | ICD-10-CM | POA: Diagnosis not present

## 2015-07-29 DIAGNOSIS — I10 Essential (primary) hypertension: Secondary | ICD-10-CM

## 2015-07-29 MED ORDER — METOPROLOL TARTRATE 25 MG PO TABS: 25.0000 mg | ORAL_TABLET | Freq: Two times a day (BID) | ORAL | Status: DC

## 2015-07-31 DIAGNOSIS — H353213 Exudative age-related macular degeneration, right eye, with inactive scar: Secondary | ICD-10-CM | POA: Diagnosis not present

## 2015-07-31 DIAGNOSIS — H401124 Primary open-angle glaucoma, left eye, indeterminate stage: Secondary | ICD-10-CM | POA: Diagnosis not present

## 2015-07-31 DIAGNOSIS — H353221 Exudative age-related macular degeneration, left eye, with active choroidal neovascularization: Secondary | ICD-10-CM | POA: Diagnosis not present

## 2015-08-07 DIAGNOSIS — D485 Neoplasm of uncertain behavior of skin: Secondary | ICD-10-CM | POA: Diagnosis not present

## 2015-08-07 DIAGNOSIS — Z85828 Personal history of other malignant neoplasm of skin: Secondary | ICD-10-CM | POA: Diagnosis not present

## 2015-08-07 DIAGNOSIS — C44529 Squamous cell carcinoma of skin of other part of trunk: Secondary | ICD-10-CM | POA: Diagnosis not present

## 2015-08-18 ENCOUNTER — Other Ambulatory Visit: Payer: Self-pay | Admitting: Internal Medicine

## 2015-08-19 ENCOUNTER — Other Ambulatory Visit: Payer: Self-pay | Admitting: Internal Medicine

## 2015-08-22 DIAGNOSIS — H353221 Exudative age-related macular degeneration, left eye, with active choroidal neovascularization: Secondary | ICD-10-CM | POA: Diagnosis not present

## 2015-08-26 DIAGNOSIS — H401122 Primary open-angle glaucoma, left eye, moderate stage: Secondary | ICD-10-CM | POA: Diagnosis not present

## 2015-09-16 DIAGNOSIS — I480 Paroxysmal atrial fibrillation: Secondary | ICD-10-CM | POA: Diagnosis not present

## 2015-09-16 DIAGNOSIS — Z6831 Body mass index (BMI) 31.0-31.9, adult: Secondary | ICD-10-CM | POA: Diagnosis not present

## 2015-09-16 DIAGNOSIS — I87319 Chronic venous hypertension (idiopathic) with ulcer of unspecified lower extremity: Secondary | ICD-10-CM | POA: Diagnosis not present

## 2015-09-16 DIAGNOSIS — D508 Other iron deficiency anemias: Secondary | ICD-10-CM | POA: Diagnosis not present

## 2015-09-16 DIAGNOSIS — L0390 Cellulitis, unspecified: Secondary | ICD-10-CM | POA: Diagnosis not present

## 2015-09-20 DIAGNOSIS — I10 Essential (primary) hypertension: Secondary | ICD-10-CM | POA: Diagnosis not present

## 2015-09-20 DIAGNOSIS — E784 Other hyperlipidemia: Secondary | ICD-10-CM | POA: Diagnosis not present

## 2015-09-24 DIAGNOSIS — I872 Venous insufficiency (chronic) (peripheral): Secondary | ICD-10-CM | POA: Diagnosis not present

## 2015-09-24 DIAGNOSIS — L03116 Cellulitis of left lower limb: Secondary | ICD-10-CM | POA: Diagnosis not present

## 2015-09-24 DIAGNOSIS — I4891 Unspecified atrial fibrillation: Secondary | ICD-10-CM | POA: Diagnosis not present

## 2015-09-24 DIAGNOSIS — I890 Lymphedema, not elsewhere classified: Secondary | ICD-10-CM | POA: Diagnosis not present

## 2015-09-24 DIAGNOSIS — L03115 Cellulitis of right lower limb: Secondary | ICD-10-CM | POA: Diagnosis not present

## 2015-09-26 ENCOUNTER — Ambulatory Visit (INDEPENDENT_AMBULATORY_CARE_PROVIDER_SITE_OTHER): Payer: Medicare Other | Admitting: Podiatry

## 2015-09-26 ENCOUNTER — Encounter: Payer: Self-pay | Admitting: Podiatry

## 2015-09-26 DIAGNOSIS — M79676 Pain in unspecified toe(s): Secondary | ICD-10-CM | POA: Diagnosis not present

## 2015-09-26 DIAGNOSIS — B351 Tinea unguium: Secondary | ICD-10-CM

## 2015-09-26 NOTE — Progress Notes (Signed)
Patient ID: Lori [name], female   DOB: 1919/06/06, 82 y.o.   MRN: [id number] Complaint:  Visit Type: Patient returns to my office for continued preventative foot care services. Complaint: Patient states" my nails have grown long and thick and become painful to walk and wear shoes" . The patient presents for preventative foot care services. No changes to ROS.  She presents with unna boots on both feet and legs both limbs.  Podiatric Exam: Vascular: dorsalis pedis and posterior tibial pulses are palpable are not palpable due to unna boots.. Capillary return is immediate.   Sensorium: Normal Semmes Weinstein monofilament test. Normal tactile sensation bilaterally. Nail Exam: Pt has thick disfigured discolored nails with subungual debris noted bilateral entire nail hallux through fifth toenails Ulcer Exam: There is no evidence of ulcer or pre-ulcerative changes or infection. Orthopedic Exam: Muscle tone and strength are WNL. No limitations in general ROM. No crepitus or effusions noted. Foot type and digits show no abnormalities. Bony prominences are unremarkable. Skin: No Porokeratosis. No infection or ulcers  Diagnosis:  Onychomycosis, , Pain in right toe, pain in left toes  Treatment & Plan Procedures and Treatment: Consent by patient was obtained for treatment procedures. The patient understood the discussion of treatment and procedures well. All questions were answered thoroughly reviewed. Debridement of mycotic and hypertrophic toenails, 1 through 5 bilateral and clearing of subungual debris. No ulceration, no infection noted.  Return Visit-Office Procedure: Patient instructed to return to the office for a follow up visit 10 weeks for continued evaluation and treatment.    Gardiner Barefoot DPM

## 2015-09-27 DIAGNOSIS — I10 Essential (primary) hypertension: Secondary | ICD-10-CM | POA: Diagnosis not present

## 2015-09-27 DIAGNOSIS — N183 Chronic kidney disease, stage 3 (moderate): Secondary | ICD-10-CM | POA: Diagnosis not present

## 2015-09-27 DIAGNOSIS — Z0000 Encounter for general adult medical examination without abnormal findings: Secondary | ICD-10-CM | POA: Diagnosis not present

## 2015-09-27 DIAGNOSIS — L03116 Cellulitis of left lower limb: Secondary | ICD-10-CM | POA: Diagnosis not present

## 2015-09-27 DIAGNOSIS — I480 Paroxysmal atrial fibrillation: Secondary | ICD-10-CM | POA: Diagnosis not present

## 2015-09-27 DIAGNOSIS — E784 Other hyperlipidemia: Secondary | ICD-10-CM | POA: Diagnosis not present

## 2015-09-27 DIAGNOSIS — D508 Other iron deficiency anemias: Secondary | ICD-10-CM | POA: Diagnosis not present

## 2015-09-27 DIAGNOSIS — Z1389 Encounter for screening for other disorder: Secondary | ICD-10-CM | POA: Diagnosis not present

## 2015-09-27 DIAGNOSIS — I872 Venous insufficiency (chronic) (peripheral): Secondary | ICD-10-CM | POA: Diagnosis not present

## 2015-09-27 DIAGNOSIS — I87319 Chronic venous hypertension (idiopathic) with ulcer of unspecified lower extremity: Secondary | ICD-10-CM | POA: Diagnosis not present

## 2015-09-27 DIAGNOSIS — I4891 Unspecified atrial fibrillation: Secondary | ICD-10-CM | POA: Diagnosis not present

## 2015-09-27 DIAGNOSIS — J45998 Other asthma: Secondary | ICD-10-CM | POA: Diagnosis not present

## 2015-09-27 DIAGNOSIS — L03115 Cellulitis of right lower limb: Secondary | ICD-10-CM | POA: Diagnosis not present

## 2015-09-27 DIAGNOSIS — M25561 Pain in right knee: Secondary | ICD-10-CM | POA: Diagnosis not present

## 2015-09-27 DIAGNOSIS — Z6830 Body mass index (BMI) 30.0-30.9, adult: Secondary | ICD-10-CM | POA: Diagnosis not present

## 2015-09-27 DIAGNOSIS — I890 Lymphedema, not elsewhere classified: Secondary | ICD-10-CM | POA: Diagnosis not present

## 2015-09-27 DIAGNOSIS — H9193 Unspecified hearing loss, bilateral: Secondary | ICD-10-CM | POA: Diagnosis not present

## 2015-10-01 DIAGNOSIS — L03115 Cellulitis of right lower limb: Secondary | ICD-10-CM | POA: Diagnosis not present

## 2015-10-01 DIAGNOSIS — I872 Venous insufficiency (chronic) (peripheral): Secondary | ICD-10-CM | POA: Diagnosis not present

## 2015-10-01 DIAGNOSIS — L03116 Cellulitis of left lower limb: Secondary | ICD-10-CM | POA: Diagnosis not present

## 2015-10-01 DIAGNOSIS — I4891 Unspecified atrial fibrillation: Secondary | ICD-10-CM | POA: Diagnosis not present

## 2015-10-01 DIAGNOSIS — I890 Lymphedema, not elsewhere classified: Secondary | ICD-10-CM | POA: Diagnosis not present

## 2015-10-04 DIAGNOSIS — I890 Lymphedema, not elsewhere classified: Secondary | ICD-10-CM | POA: Diagnosis not present

## 2015-10-04 DIAGNOSIS — I4891 Unspecified atrial fibrillation: Secondary | ICD-10-CM | POA: Diagnosis not present

## 2015-10-04 DIAGNOSIS — I872 Venous insufficiency (chronic) (peripheral): Secondary | ICD-10-CM | POA: Diagnosis not present

## 2015-10-04 DIAGNOSIS — L03115 Cellulitis of right lower limb: Secondary | ICD-10-CM | POA: Diagnosis not present

## 2015-10-04 DIAGNOSIS — L03116 Cellulitis of left lower limb: Secondary | ICD-10-CM | POA: Diagnosis not present

## 2015-10-08 DIAGNOSIS — L03116 Cellulitis of left lower limb: Secondary | ICD-10-CM | POA: Diagnosis not present

## 2015-10-08 DIAGNOSIS — I4891 Unspecified atrial fibrillation: Secondary | ICD-10-CM | POA: Diagnosis not present

## 2015-10-08 DIAGNOSIS — I890 Lymphedema, not elsewhere classified: Secondary | ICD-10-CM | POA: Diagnosis not present

## 2015-10-08 DIAGNOSIS — I872 Venous insufficiency (chronic) (peripheral): Secondary | ICD-10-CM | POA: Diagnosis not present

## 2015-10-08 DIAGNOSIS — L03115 Cellulitis of right lower limb: Secondary | ICD-10-CM | POA: Diagnosis not present

## 2015-10-10 DIAGNOSIS — H353221 Exudative age-related macular degeneration, left eye, with active choroidal neovascularization: Secondary | ICD-10-CM | POA: Diagnosis not present

## 2015-10-11 DIAGNOSIS — I872 Venous insufficiency (chronic) (peripheral): Secondary | ICD-10-CM | POA: Diagnosis not present

## 2015-10-11 DIAGNOSIS — I4891 Unspecified atrial fibrillation: Secondary | ICD-10-CM | POA: Diagnosis not present

## 2015-10-11 DIAGNOSIS — L03116 Cellulitis of left lower limb: Secondary | ICD-10-CM | POA: Diagnosis not present

## 2015-10-11 DIAGNOSIS — L03115 Cellulitis of right lower limb: Secondary | ICD-10-CM | POA: Diagnosis not present

## 2015-10-11 DIAGNOSIS — I890 Lymphedema, not elsewhere classified: Secondary | ICD-10-CM | POA: Diagnosis not present

## 2015-10-15 DIAGNOSIS — L03116 Cellulitis of left lower limb: Secondary | ICD-10-CM | POA: Diagnosis not present

## 2015-10-15 DIAGNOSIS — I872 Venous insufficiency (chronic) (peripheral): Secondary | ICD-10-CM | POA: Diagnosis not present

## 2015-10-15 DIAGNOSIS — L03115 Cellulitis of right lower limb: Secondary | ICD-10-CM | POA: Diagnosis not present

## 2015-10-15 DIAGNOSIS — I4891 Unspecified atrial fibrillation: Secondary | ICD-10-CM | POA: Diagnosis not present

## 2015-10-15 DIAGNOSIS — I890 Lymphedema, not elsewhere classified: Secondary | ICD-10-CM | POA: Diagnosis not present

## 2015-10-22 DIAGNOSIS — L03116 Cellulitis of left lower limb: Secondary | ICD-10-CM | POA: Diagnosis not present

## 2015-10-22 DIAGNOSIS — H938X3 Other specified disorders of ear, bilateral: Secondary | ICD-10-CM | POA: Diagnosis not present

## 2015-10-22 DIAGNOSIS — H903 Sensorineural hearing loss, bilateral: Secondary | ICD-10-CM | POA: Diagnosis not present

## 2015-10-22 DIAGNOSIS — L299 Pruritus, unspecified: Secondary | ICD-10-CM | POA: Diagnosis not present

## 2015-10-22 DIAGNOSIS — I4891 Unspecified atrial fibrillation: Secondary | ICD-10-CM | POA: Diagnosis not present

## 2015-10-22 DIAGNOSIS — H6123 Impacted cerumen, bilateral: Secondary | ICD-10-CM | POA: Diagnosis not present

## 2015-10-22 DIAGNOSIS — I890 Lymphedema, not elsewhere classified: Secondary | ICD-10-CM | POA: Diagnosis not present

## 2015-10-22 DIAGNOSIS — I872 Venous insufficiency (chronic) (peripheral): Secondary | ICD-10-CM | POA: Diagnosis not present

## 2015-10-22 DIAGNOSIS — L03115 Cellulitis of right lower limb: Secondary | ICD-10-CM | POA: Diagnosis not present

## 2015-10-24 DIAGNOSIS — L03115 Cellulitis of right lower limb: Secondary | ICD-10-CM | POA: Diagnosis not present

## 2015-10-24 DIAGNOSIS — I4891 Unspecified atrial fibrillation: Secondary | ICD-10-CM | POA: Diagnosis not present

## 2015-10-24 DIAGNOSIS — I872 Venous insufficiency (chronic) (peripheral): Secondary | ICD-10-CM | POA: Diagnosis not present

## 2015-10-24 DIAGNOSIS — I890 Lymphedema, not elsewhere classified: Secondary | ICD-10-CM | POA: Diagnosis not present

## 2015-10-24 DIAGNOSIS — L03116 Cellulitis of left lower limb: Secondary | ICD-10-CM | POA: Diagnosis not present

## 2015-11-18 ENCOUNTER — Telehealth: Payer: Self-pay

## 2015-11-18 NOTE — Telephone Encounter (Signed)
Prior auth for Pradaxa 150mg  [facility] to [facility]

## 2015-11-22 DIAGNOSIS — M1711 Unilateral primary osteoarthritis, right knee: Secondary | ICD-10-CM | POA: Diagnosis not present

## 2015-11-26 ENCOUNTER — Telehealth: Payer: Self-pay

## 2015-11-26 NOTE — Telephone Encounter (Signed)
Pradaxa 150mg  approved through 11/17/2016. Case ID- [id number].

## 2015-11-28 DIAGNOSIS — H353221 Exudative age-related macular degeneration, left eye, with active choroidal neovascularization: Secondary | ICD-10-CM | POA: Diagnosis not present

## 2015-12-05 ENCOUNTER — Ambulatory Visit: Payer: Medicare Other | Admitting: Podiatry

## 2015-12-12 ENCOUNTER — Encounter: Payer: Self-pay | Admitting: Podiatry

## 2015-12-12 ENCOUNTER — Ambulatory Visit (INDEPENDENT_AMBULATORY_CARE_PROVIDER_SITE_OTHER): Payer: Medicare Other | Admitting: Podiatry

## 2015-12-12 DIAGNOSIS — M79676 Pain in unspecified toe(s): Secondary | ICD-10-CM | POA: Diagnosis not present

## 2015-12-12 DIAGNOSIS — B351 Tinea unguium: Secondary | ICD-10-CM

## 2015-12-12 NOTE — Progress Notes (Signed)
Patient ID: Lori [name], female   DOB: 1919-01-27, 82 y.o.   MRN: [id number] Complaint:  Visit Type: Patient returns to my office for continued preventative foot care services. Complaint: Patient states" my nails have grown long and thick and become painful to walk and wear shoes" . The patient presents for preventative foot care services. No changes to ROS.  She presents with unna boots on both feet and legs both limbs.  Podiatric Exam: Vascular: dorsalis pedis and posterior tibial pulses are palpable are not palpable due to unna boots.. Capillary return is immediate.   Sensorium: Normal Semmes Weinstein monofilament test. Normal tactile sensation bilaterally. Nail Exam: Pt has thick disfigured discolored nails with subungual debris noted bilateral entire nail hallux through fifth toenails Ulcer Exam: There is no evidence of ulcer or pre-ulcerative changes or infection. Orthopedic Exam: Muscle tone and strength are WNL. No limitations in general ROM. No crepitus or effusions noted. Foot type and digits show no abnormalities. Bony prominences are unremarkable. Skin: No Porokeratosis. No infection or ulcers  Diagnosis:  Onychomycosis, , Pain in right toe, pain in left toes  Treatment & Plan Procedures and Treatment: Consent by patient was obtained for treatment procedures. The patient understood the discussion of treatment and procedures well. All questions were answered thoroughly reviewed. Debridement of mycotic and hypertrophic toenails, 1 through 5 bilateral and clearing of subungual debris. No ulceration, no infection noted.  Return Visit-Office Procedure: Patient instructed to return to the office for a follow up visit 10 weeks for continued evaluation and treatment.    Gardiner Barefoot DPM

## 2016-01-06 DIAGNOSIS — H401122 Primary open-angle glaucoma, left eye, moderate stage: Secondary | ICD-10-CM | POA: Diagnosis not present

## 2016-01-06 DIAGNOSIS — H401121 Primary open-angle glaucoma, left eye, mild stage: Secondary | ICD-10-CM | POA: Diagnosis not present

## 2016-01-09 DIAGNOSIS — H9113 Presbycusis, bilateral: Secondary | ICD-10-CM | POA: Diagnosis not present

## 2016-01-09 DIAGNOSIS — H938X3 Other specified disorders of ear, bilateral: Secondary | ICD-10-CM | POA: Diagnosis not present

## 2016-01-09 DIAGNOSIS — Z974 Presence of external hearing-aid: Secondary | ICD-10-CM | POA: Diagnosis not present

## 2016-01-16 DIAGNOSIS — H353221 Exudative age-related macular degeneration, left eye, with active choroidal neovascularization: Secondary | ICD-10-CM | POA: Diagnosis not present

## 2016-02-13 ENCOUNTER — Ambulatory Visit: Payer: Medicare Other | Admitting: Podiatry

## 2016-02-15 ENCOUNTER — Other Ambulatory Visit: Payer: Self-pay | Admitting: Internal Medicine

## 2016-02-15 DIAGNOSIS — I10 Essential (primary) hypertension: Secondary | ICD-10-CM

## 2016-02-20 ENCOUNTER — Ambulatory Visit (INDEPENDENT_AMBULATORY_CARE_PROVIDER_SITE_OTHER): Payer: Medicare Other | Admitting: Podiatry

## 2016-02-20 DIAGNOSIS — M79676 Pain in unspecified toe(s): Secondary | ICD-10-CM | POA: Diagnosis not present

## 2016-02-20 DIAGNOSIS — B351 Tinea unguium: Secondary | ICD-10-CM

## 2016-02-20 NOTE — Progress Notes (Signed)
Patient ID: [name], female   DOB: [birth year], 82 y.o.   MRN: [id number] Complaint:  Visit Type: Patient returns to my office for continued preventative foot care services. Complaint: Patient states" my nails have grown long and thick and become painful to walk and wear shoes" . The patient presents for preventative foot care services. No changes to ROS.    Podiatric Exam: Vascular: dorsalis pedis and posterior tibial pulses are palpable are palpable  Bilateral.. Capillary return is immediate.   Sensorium: Normal Semmes Weinstein monofilament test. Normal tactile sensation bilaterally. Nail Exam: Pt has thick disfigured discolored nails with subungual debris noted bilateral entire nail hallux through fifth toenails Ulcer Exam: There is no evidence of ulcer or pre-ulcerative changes or infection. Orthopedic Exam: Muscle tone and strength are WNL. No limitations in general ROM. No crepitus or effusions noted. Foot type and digits show no abnormalities. Bony prominences are unremarkable. Skin: No Porokeratosis. No infection or ulcers  Diagnosis:  Onychomycosis, , Pain in right toe, pain in left toes  Treatment & Plan Procedures and Treatment: Consent by patient was obtained for treatment procedures. The patient understood the discussion of treatment and procedures well. All questions were answered thoroughly reviewed. Debridement of mycotic and hypertrophic toenails, 1 through 5 bilateral and clearing of subungual debris. No ulceration, no infection noted.  Return Visit-Office Procedure: Patient instructed to return to the office for a follow up visit 9  weeks for continued evaluation and treatment.    Gardiner Barefoot DPM

## 2016-03-05 DIAGNOSIS — H353221 Exudative age-related macular degeneration, left eye, with active choroidal neovascularization: Secondary | ICD-10-CM | POA: Diagnosis not present

## 2016-03-12 ENCOUNTER — Ambulatory Visit: Payer: Medicare Other | Admitting: Podiatry

## 2016-03-30 DIAGNOSIS — Z6829 Body mass index (BMI) 29.0-29.9, adult: Secondary | ICD-10-CM | POA: Diagnosis not present

## 2016-03-30 DIAGNOSIS — D509 Iron deficiency anemia, unspecified: Secondary | ICD-10-CM | POA: Diagnosis not present

## 2016-03-30 DIAGNOSIS — I480 Paroxysmal atrial fibrillation: Secondary | ICD-10-CM | POA: Diagnosis not present

## 2016-03-30 DIAGNOSIS — N183 Chronic kidney disease, stage 3 (moderate): Secondary | ICD-10-CM | POA: Diagnosis not present

## 2016-03-30 DIAGNOSIS — Z23 Encounter for immunization: Secondary | ICD-10-CM | POA: Diagnosis not present

## 2016-03-30 DIAGNOSIS — I10 Essential (primary) hypertension: Secondary | ICD-10-CM | POA: Diagnosis not present

## 2016-03-30 DIAGNOSIS — D508 Other iron deficiency anemias: Secondary | ICD-10-CM | POA: Diagnosis not present

## 2016-03-30 DIAGNOSIS — I87319 Chronic venous hypertension (idiopathic) with ulcer of unspecified lower extremity: Secondary | ICD-10-CM | POA: Diagnosis not present

## 2016-04-15 DIAGNOSIS — L97929 Non-pressure chronic ulcer of unspecified part of left lower leg with unspecified severity: Secondary | ICD-10-CM | POA: Diagnosis not present

## 2016-04-15 DIAGNOSIS — I4891 Unspecified atrial fibrillation: Secondary | ICD-10-CM | POA: Diagnosis not present

## 2016-04-15 DIAGNOSIS — I129 Hypertensive chronic kidney disease with stage 1 through stage 4 chronic kidney disease, or unspecified chronic kidney disease: Secondary | ICD-10-CM | POA: Diagnosis not present

## 2016-04-15 DIAGNOSIS — N183 Chronic kidney disease, stage 3 (moderate): Secondary | ICD-10-CM | POA: Diagnosis not present

## 2016-04-15 DIAGNOSIS — J45998 Other asthma: Secondary | ICD-10-CM | POA: Diagnosis not present

## 2016-04-15 DIAGNOSIS — I872 Venous insufficiency (chronic) (peripheral): Secondary | ICD-10-CM | POA: Diagnosis not present

## 2016-04-15 DIAGNOSIS — J45909 Unspecified asthma, uncomplicated: Secondary | ICD-10-CM | POA: Diagnosis not present

## 2016-04-16 DIAGNOSIS — I129 Hypertensive chronic kidney disease with stage 1 through stage 4 chronic kidney disease, or unspecified chronic kidney disease: Secondary | ICD-10-CM | POA: Diagnosis not present

## 2016-04-16 DIAGNOSIS — L97929 Non-pressure chronic ulcer of unspecified part of left lower leg with unspecified severity: Secondary | ICD-10-CM | POA: Diagnosis not present

## 2016-04-16 DIAGNOSIS — N183 Chronic kidney disease, stage 3 (moderate): Secondary | ICD-10-CM | POA: Diagnosis not present

## 2016-04-16 DIAGNOSIS — I872 Venous insufficiency (chronic) (peripheral): Secondary | ICD-10-CM | POA: Diagnosis not present

## 2016-04-16 DIAGNOSIS — I4891 Unspecified atrial fibrillation: Secondary | ICD-10-CM | POA: Diagnosis not present

## 2016-04-16 DIAGNOSIS — J45909 Unspecified asthma, uncomplicated: Secondary | ICD-10-CM | POA: Diagnosis not present

## 2016-04-21 DIAGNOSIS — L97929 Non-pressure chronic ulcer of unspecified part of left lower leg with unspecified severity: Secondary | ICD-10-CM | POA: Diagnosis not present

## 2016-04-21 DIAGNOSIS — I4891 Unspecified atrial fibrillation: Secondary | ICD-10-CM | POA: Diagnosis not present

## 2016-04-21 DIAGNOSIS — I129 Hypertensive chronic kidney disease with stage 1 through stage 4 chronic kidney disease, or unspecified chronic kidney disease: Secondary | ICD-10-CM | POA: Diagnosis not present

## 2016-04-21 DIAGNOSIS — J45909 Unspecified asthma, uncomplicated: Secondary | ICD-10-CM | POA: Diagnosis not present

## 2016-04-21 DIAGNOSIS — N183 Chronic kidney disease, stage 3 (moderate): Secondary | ICD-10-CM | POA: Diagnosis not present

## 2016-04-21 DIAGNOSIS — I872 Venous insufficiency (chronic) (peripheral): Secondary | ICD-10-CM | POA: Diagnosis not present

## 2016-04-23 ENCOUNTER — Ambulatory Visit: Payer: Medicare Other | Admitting: Podiatry

## 2016-04-23 DIAGNOSIS — L97929 Non-pressure chronic ulcer of unspecified part of left lower leg with unspecified severity: Secondary | ICD-10-CM | POA: Diagnosis not present

## 2016-04-23 DIAGNOSIS — J45909 Unspecified asthma, uncomplicated: Secondary | ICD-10-CM | POA: Diagnosis not present

## 2016-04-23 DIAGNOSIS — I129 Hypertensive chronic kidney disease with stage 1 through stage 4 chronic kidney disease, or unspecified chronic kidney disease: Secondary | ICD-10-CM | POA: Diagnosis not present

## 2016-04-23 DIAGNOSIS — I4891 Unspecified atrial fibrillation: Secondary | ICD-10-CM | POA: Diagnosis not present

## 2016-04-23 DIAGNOSIS — I872 Venous insufficiency (chronic) (peripheral): Secondary | ICD-10-CM | POA: Diagnosis not present

## 2016-04-23 DIAGNOSIS — N183 Chronic kidney disease, stage 3 (moderate): Secondary | ICD-10-CM | POA: Diagnosis not present

## 2016-04-24 ENCOUNTER — Ambulatory Visit: Payer: Medicare Other | Admitting: Podiatry

## 2016-04-28 DIAGNOSIS — I872 Venous insufficiency (chronic) (peripheral): Secondary | ICD-10-CM | POA: Diagnosis not present

## 2016-04-28 DIAGNOSIS — I4891 Unspecified atrial fibrillation: Secondary | ICD-10-CM | POA: Diagnosis not present

## 2016-04-28 DIAGNOSIS — I129 Hypertensive chronic kidney disease with stage 1 through stage 4 chronic kidney disease, or unspecified chronic kidney disease: Secondary | ICD-10-CM | POA: Diagnosis not present

## 2016-04-28 DIAGNOSIS — N183 Chronic kidney disease, stage 3 (moderate): Secondary | ICD-10-CM | POA: Diagnosis not present

## 2016-04-28 DIAGNOSIS — L97929 Non-pressure chronic ulcer of unspecified part of left lower leg with unspecified severity: Secondary | ICD-10-CM | POA: Diagnosis not present

## 2016-04-28 DIAGNOSIS — J45909 Unspecified asthma, uncomplicated: Secondary | ICD-10-CM | POA: Diagnosis not present

## 2016-04-30 DIAGNOSIS — M1711 Unilateral primary osteoarthritis, right knee: Secondary | ICD-10-CM | POA: Diagnosis not present

## 2016-05-01 DIAGNOSIS — N183 Chronic kidney disease, stage 3 (moderate): Secondary | ICD-10-CM | POA: Diagnosis not present

## 2016-05-01 DIAGNOSIS — J45909 Unspecified asthma, uncomplicated: Secondary | ICD-10-CM | POA: Diagnosis not present

## 2016-05-01 DIAGNOSIS — I129 Hypertensive chronic kidney disease with stage 1 through stage 4 chronic kidney disease, or unspecified chronic kidney disease: Secondary | ICD-10-CM | POA: Diagnosis not present

## 2016-05-01 DIAGNOSIS — I872 Venous insufficiency (chronic) (peripheral): Secondary | ICD-10-CM | POA: Diagnosis not present

## 2016-05-01 DIAGNOSIS — L97929 Non-pressure chronic ulcer of unspecified part of left lower leg with unspecified severity: Secondary | ICD-10-CM | POA: Diagnosis not present

## 2016-05-01 DIAGNOSIS — I4891 Unspecified atrial fibrillation: Secondary | ICD-10-CM | POA: Diagnosis not present

## 2016-05-05 DIAGNOSIS — N183 Chronic kidney disease, stage 3 (moderate): Secondary | ICD-10-CM | POA: Diagnosis not present

## 2016-05-05 DIAGNOSIS — L97929 Non-pressure chronic ulcer of unspecified part of left lower leg with unspecified severity: Secondary | ICD-10-CM | POA: Diagnosis not present

## 2016-05-05 DIAGNOSIS — J45909 Unspecified asthma, uncomplicated: Secondary | ICD-10-CM | POA: Diagnosis not present

## 2016-05-05 DIAGNOSIS — I129 Hypertensive chronic kidney disease with stage 1 through stage 4 chronic kidney disease, or unspecified chronic kidney disease: Secondary | ICD-10-CM | POA: Diagnosis not present

## 2016-05-05 DIAGNOSIS — I872 Venous insufficiency (chronic) (peripheral): Secondary | ICD-10-CM | POA: Diagnosis not present

## 2016-05-05 DIAGNOSIS — I4891 Unspecified atrial fibrillation: Secondary | ICD-10-CM | POA: Diagnosis not present

## 2016-05-08 DIAGNOSIS — I4891 Unspecified atrial fibrillation: Secondary | ICD-10-CM | POA: Diagnosis not present

## 2016-05-08 DIAGNOSIS — L97929 Non-pressure chronic ulcer of unspecified part of left lower leg with unspecified severity: Secondary | ICD-10-CM | POA: Diagnosis not present

## 2016-05-08 DIAGNOSIS — N183 Chronic kidney disease, stage 3 (moderate): Secondary | ICD-10-CM | POA: Diagnosis not present

## 2016-05-08 DIAGNOSIS — J45909 Unspecified asthma, uncomplicated: Secondary | ICD-10-CM | POA: Diagnosis not present

## 2016-05-08 DIAGNOSIS — I872 Venous insufficiency (chronic) (peripheral): Secondary | ICD-10-CM | POA: Diagnosis not present

## 2016-05-08 DIAGNOSIS — I129 Hypertensive chronic kidney disease with stage 1 through stage 4 chronic kidney disease, or unspecified chronic kidney disease: Secondary | ICD-10-CM | POA: Diagnosis not present

## 2016-05-12 ENCOUNTER — Ambulatory Visit: Payer: Medicare Other | Admitting: Podiatry

## 2016-05-12 DIAGNOSIS — I4891 Unspecified atrial fibrillation: Secondary | ICD-10-CM | POA: Diagnosis not present

## 2016-05-12 DIAGNOSIS — L97929 Non-pressure chronic ulcer of unspecified part of left lower leg with unspecified severity: Secondary | ICD-10-CM | POA: Diagnosis not present

## 2016-05-12 DIAGNOSIS — J45909 Unspecified asthma, uncomplicated: Secondary | ICD-10-CM | POA: Diagnosis not present

## 2016-05-12 DIAGNOSIS — N183 Chronic kidney disease, stage 3 (moderate): Secondary | ICD-10-CM | POA: Diagnosis not present

## 2016-05-12 DIAGNOSIS — I872 Venous insufficiency (chronic) (peripheral): Secondary | ICD-10-CM | POA: Diagnosis not present

## 2016-05-12 DIAGNOSIS — I129 Hypertensive chronic kidney disease with stage 1 through stage 4 chronic kidney disease, or unspecified chronic kidney disease: Secondary | ICD-10-CM | POA: Diagnosis not present

## 2016-05-14 DIAGNOSIS — H353221 Exudative age-related macular degeneration, left eye, with active choroidal neovascularization: Secondary | ICD-10-CM | POA: Diagnosis not present

## 2016-05-15 DIAGNOSIS — J45909 Unspecified asthma, uncomplicated: Secondary | ICD-10-CM | POA: Diagnosis not present

## 2016-05-15 DIAGNOSIS — I4891 Unspecified atrial fibrillation: Secondary | ICD-10-CM | POA: Diagnosis not present

## 2016-05-15 DIAGNOSIS — L97929 Non-pressure chronic ulcer of unspecified part of left lower leg with unspecified severity: Secondary | ICD-10-CM | POA: Diagnosis not present

## 2016-05-15 DIAGNOSIS — I872 Venous insufficiency (chronic) (peripheral): Secondary | ICD-10-CM | POA: Diagnosis not present

## 2016-05-15 DIAGNOSIS — N183 Chronic kidney disease, stage 3 (moderate): Secondary | ICD-10-CM | POA: Diagnosis not present

## 2016-05-15 DIAGNOSIS — I129 Hypertensive chronic kidney disease with stage 1 through stage 4 chronic kidney disease, or unspecified chronic kidney disease: Secondary | ICD-10-CM | POA: Diagnosis not present

## 2016-05-19 DIAGNOSIS — L97929 Non-pressure chronic ulcer of unspecified part of left lower leg with unspecified severity: Secondary | ICD-10-CM | POA: Diagnosis not present

## 2016-05-19 DIAGNOSIS — I872 Venous insufficiency (chronic) (peripheral): Secondary | ICD-10-CM | POA: Diagnosis not present

## 2016-05-19 DIAGNOSIS — I129 Hypertensive chronic kidney disease with stage 1 through stage 4 chronic kidney disease, or unspecified chronic kidney disease: Secondary | ICD-10-CM | POA: Diagnosis not present

## 2016-05-19 DIAGNOSIS — N183 Chronic kidney disease, stage 3 (moderate): Secondary | ICD-10-CM | POA: Diagnosis not present

## 2016-05-19 DIAGNOSIS — I4891 Unspecified atrial fibrillation: Secondary | ICD-10-CM | POA: Diagnosis not present

## 2016-05-19 DIAGNOSIS — J45909 Unspecified asthma, uncomplicated: Secondary | ICD-10-CM | POA: Diagnosis not present

## 2016-05-21 DIAGNOSIS — J45909 Unspecified asthma, uncomplicated: Secondary | ICD-10-CM | POA: Diagnosis not present

## 2016-05-21 DIAGNOSIS — I129 Hypertensive chronic kidney disease with stage 1 through stage 4 chronic kidney disease, or unspecified chronic kidney disease: Secondary | ICD-10-CM | POA: Diagnosis not present

## 2016-05-21 DIAGNOSIS — L97929 Non-pressure chronic ulcer of unspecified part of left lower leg with unspecified severity: Secondary | ICD-10-CM | POA: Diagnosis not present

## 2016-05-21 DIAGNOSIS — I872 Venous insufficiency (chronic) (peripheral): Secondary | ICD-10-CM | POA: Diagnosis not present

## 2016-05-21 DIAGNOSIS — N183 Chronic kidney disease, stage 3 (moderate): Secondary | ICD-10-CM | POA: Diagnosis not present

## 2016-05-21 DIAGNOSIS — I4891 Unspecified atrial fibrillation: Secondary | ICD-10-CM | POA: Diagnosis not present

## 2016-05-22 DIAGNOSIS — J45909 Unspecified asthma, uncomplicated: Secondary | ICD-10-CM | POA: Diagnosis not present

## 2016-05-22 DIAGNOSIS — N183 Chronic kidney disease, stage 3 (moderate): Secondary | ICD-10-CM | POA: Diagnosis not present

## 2016-05-22 DIAGNOSIS — I872 Venous insufficiency (chronic) (peripheral): Secondary | ICD-10-CM | POA: Diagnosis not present

## 2016-05-22 DIAGNOSIS — I129 Hypertensive chronic kidney disease with stage 1 through stage 4 chronic kidney disease, or unspecified chronic kidney disease: Secondary | ICD-10-CM | POA: Diagnosis not present

## 2016-05-22 DIAGNOSIS — L97929 Non-pressure chronic ulcer of unspecified part of left lower leg with unspecified severity: Secondary | ICD-10-CM | POA: Diagnosis not present

## 2016-05-22 DIAGNOSIS — I4891 Unspecified atrial fibrillation: Secondary | ICD-10-CM | POA: Diagnosis not present

## 2016-05-26 ENCOUNTER — Encounter: Payer: Self-pay | Admitting: Podiatry

## 2016-05-26 ENCOUNTER — Ambulatory Visit (INDEPENDENT_AMBULATORY_CARE_PROVIDER_SITE_OTHER): Payer: Medicare Other | Admitting: Podiatry

## 2016-05-26 VITALS — Ht 64.0 in | Wt 181.0 lb

## 2016-05-26 DIAGNOSIS — I4891 Unspecified atrial fibrillation: Secondary | ICD-10-CM | POA: Diagnosis not present

## 2016-05-26 DIAGNOSIS — B351 Tinea unguium: Secondary | ICD-10-CM | POA: Diagnosis not present

## 2016-05-26 DIAGNOSIS — J45909 Unspecified asthma, uncomplicated: Secondary | ICD-10-CM | POA: Diagnosis not present

## 2016-05-26 DIAGNOSIS — L97929 Non-pressure chronic ulcer of unspecified part of left lower leg with unspecified severity: Secondary | ICD-10-CM | POA: Diagnosis not present

## 2016-05-26 DIAGNOSIS — I129 Hypertensive chronic kidney disease with stage 1 through stage 4 chronic kidney disease, or unspecified chronic kidney disease: Secondary | ICD-10-CM | POA: Diagnosis not present

## 2016-05-26 DIAGNOSIS — M79676 Pain in unspecified toe(s): Secondary | ICD-10-CM

## 2016-05-26 DIAGNOSIS — I872 Venous insufficiency (chronic) (peripheral): Secondary | ICD-10-CM | POA: Diagnosis not present

## 2016-05-26 DIAGNOSIS — N183 Chronic kidney disease, stage 3 (moderate): Secondary | ICD-10-CM | POA: Diagnosis not present

## 2016-05-26 NOTE — Progress Notes (Signed)
Patient ID: Lori [name], female   DOB: [birth year], 82 y.o.   MRN: [id number] Complaint:  Visit Type: Patient returns to my office for continued preventative foot care services. Complaint: Patient states" my nails have grown long and thick and become painful to walk and wear shoes" . The patient presents for preventative foot care services. No changes to ROS.    Podiatric Exam: Vascular: dorsalis pedis and posterior tibial pulses are palpable are palpable  Bilateral.. Capillary return is immediate.   Sensorium: Normal Semmes Weinstein monofilament test. Normal tactile sensation bilaterally. Nail Exam: Pt has thick disfigured discolored nails with subungual debris noted bilateral entire nail hallux through fifth toenails Ulcer Exam: There is no evidence of ulcer or pre-ulcerative changes or infection. Orthopedic Exam: Muscle tone and strength are WNL. No limitations in general ROM. No crepitus or effusions noted. Foot type and digits show no abnormalities. Bony prominences are unremarkable. Skin: No Porokeratosis. No infection or ulcers  Diagnosis:  Onychomycosis, , Pain in right toe, pain in left toes  Treatment & Plan Procedures and Treatment: Consent by patient was obtained for treatment procedures. The patient understood the discussion of treatment and procedures well. All questions were answered thoroughly reviewed. Debridement of mycotic and hypertrophic toenails, 1 through 5 bilateral and clearing of subungual debris. No ulceration, no infection noted.  Return Visit-Office Procedure: Patient instructed to return to the office for a follow up visit 9  weeks for continued evaluation and treatment.    Gardiner Barefoot DPM

## 2016-05-27 DIAGNOSIS — I4891 Unspecified atrial fibrillation: Secondary | ICD-10-CM | POA: Diagnosis not present

## 2016-05-27 DIAGNOSIS — J45909 Unspecified asthma, uncomplicated: Secondary | ICD-10-CM | POA: Diagnosis not present

## 2016-05-27 DIAGNOSIS — I129 Hypertensive chronic kidney disease with stage 1 through stage 4 chronic kidney disease, or unspecified chronic kidney disease: Secondary | ICD-10-CM | POA: Diagnosis not present

## 2016-05-27 DIAGNOSIS — I872 Venous insufficiency (chronic) (peripheral): Secondary | ICD-10-CM | POA: Diagnosis not present

## 2016-05-27 DIAGNOSIS — N183 Chronic kidney disease, stage 3 (moderate): Secondary | ICD-10-CM | POA: Diagnosis not present

## 2016-05-27 DIAGNOSIS — L97929 Non-pressure chronic ulcer of unspecified part of left lower leg with unspecified severity: Secondary | ICD-10-CM | POA: Diagnosis not present

## 2016-05-29 DIAGNOSIS — I4891 Unspecified atrial fibrillation: Secondary | ICD-10-CM | POA: Diagnosis not present

## 2016-05-29 DIAGNOSIS — I129 Hypertensive chronic kidney disease with stage 1 through stage 4 chronic kidney disease, or unspecified chronic kidney disease: Secondary | ICD-10-CM | POA: Diagnosis not present

## 2016-05-29 DIAGNOSIS — L97929 Non-pressure chronic ulcer of unspecified part of left lower leg with unspecified severity: Secondary | ICD-10-CM | POA: Diagnosis not present

## 2016-05-29 DIAGNOSIS — N183 Chronic kidney disease, stage 3 (moderate): Secondary | ICD-10-CM | POA: Diagnosis not present

## 2016-05-29 DIAGNOSIS — I872 Venous insufficiency (chronic) (peripheral): Secondary | ICD-10-CM | POA: Diagnosis not present

## 2016-05-29 DIAGNOSIS — J45909 Unspecified asthma, uncomplicated: Secondary | ICD-10-CM | POA: Diagnosis not present

## 2016-06-02 DIAGNOSIS — L97929 Non-pressure chronic ulcer of unspecified part of left lower leg with unspecified severity: Secondary | ICD-10-CM | POA: Diagnosis not present

## 2016-06-02 DIAGNOSIS — I872 Venous insufficiency (chronic) (peripheral): Secondary | ICD-10-CM | POA: Diagnosis not present

## 2016-06-02 DIAGNOSIS — J45909 Unspecified asthma, uncomplicated: Secondary | ICD-10-CM | POA: Diagnosis not present

## 2016-06-02 DIAGNOSIS — I4891 Unspecified atrial fibrillation: Secondary | ICD-10-CM | POA: Diagnosis not present

## 2016-06-02 DIAGNOSIS — I129 Hypertensive chronic kidney disease with stage 1 through stage 4 chronic kidney disease, or unspecified chronic kidney disease: Secondary | ICD-10-CM | POA: Diagnosis not present

## 2016-06-02 DIAGNOSIS — N183 Chronic kidney disease, stage 3 (moderate): Secondary | ICD-10-CM | POA: Diagnosis not present

## 2016-06-03 DIAGNOSIS — I4891 Unspecified atrial fibrillation: Secondary | ICD-10-CM | POA: Diagnosis not present

## 2016-06-03 DIAGNOSIS — J45909 Unspecified asthma, uncomplicated: Secondary | ICD-10-CM | POA: Diagnosis not present

## 2016-06-03 DIAGNOSIS — I129 Hypertensive chronic kidney disease with stage 1 through stage 4 chronic kidney disease, or unspecified chronic kidney disease: Secondary | ICD-10-CM | POA: Diagnosis not present

## 2016-06-03 DIAGNOSIS — I872 Venous insufficiency (chronic) (peripheral): Secondary | ICD-10-CM | POA: Diagnosis not present

## 2016-06-03 DIAGNOSIS — L97929 Non-pressure chronic ulcer of unspecified part of left lower leg with unspecified severity: Secondary | ICD-10-CM | POA: Diagnosis not present

## 2016-06-03 DIAGNOSIS — N183 Chronic kidney disease, stage 3 (moderate): Secondary | ICD-10-CM | POA: Diagnosis not present

## 2016-06-09 DIAGNOSIS — I872 Venous insufficiency (chronic) (peripheral): Secondary | ICD-10-CM | POA: Diagnosis not present

## 2016-06-09 DIAGNOSIS — N183 Chronic kidney disease, stage 3 (moderate): Secondary | ICD-10-CM | POA: Diagnosis not present

## 2016-06-09 DIAGNOSIS — I4891 Unspecified atrial fibrillation: Secondary | ICD-10-CM | POA: Diagnosis not present

## 2016-06-09 DIAGNOSIS — I129 Hypertensive chronic kidney disease with stage 1 through stage 4 chronic kidney disease, or unspecified chronic kidney disease: Secondary | ICD-10-CM | POA: Diagnosis not present

## 2016-06-09 DIAGNOSIS — L97929 Non-pressure chronic ulcer of unspecified part of left lower leg with unspecified severity: Secondary | ICD-10-CM | POA: Diagnosis not present

## 2016-06-09 DIAGNOSIS — J45909 Unspecified asthma, uncomplicated: Secondary | ICD-10-CM | POA: Diagnosis not present

## 2016-06-10 DIAGNOSIS — N183 Chronic kidney disease, stage 3 (moderate): Secondary | ICD-10-CM | POA: Diagnosis not present

## 2016-06-10 DIAGNOSIS — J45909 Unspecified asthma, uncomplicated: Secondary | ICD-10-CM | POA: Diagnosis not present

## 2016-06-10 DIAGNOSIS — I4891 Unspecified atrial fibrillation: Secondary | ICD-10-CM | POA: Diagnosis not present

## 2016-06-10 DIAGNOSIS — L97929 Non-pressure chronic ulcer of unspecified part of left lower leg with unspecified severity: Secondary | ICD-10-CM | POA: Diagnosis not present

## 2016-06-10 DIAGNOSIS — I129 Hypertensive chronic kidney disease with stage 1 through stage 4 chronic kidney disease, or unspecified chronic kidney disease: Secondary | ICD-10-CM | POA: Diagnosis not present

## 2016-06-10 DIAGNOSIS — I872 Venous insufficiency (chronic) (peripheral): Secondary | ICD-10-CM | POA: Diagnosis not present

## 2016-06-11 DIAGNOSIS — I872 Venous insufficiency (chronic) (peripheral): Secondary | ICD-10-CM | POA: Diagnosis not present

## 2016-06-11 DIAGNOSIS — J45909 Unspecified asthma, uncomplicated: Secondary | ICD-10-CM | POA: Diagnosis not present

## 2016-06-11 DIAGNOSIS — N183 Chronic kidney disease, stage 3 (moderate): Secondary | ICD-10-CM | POA: Diagnosis not present

## 2016-06-11 DIAGNOSIS — L97929 Non-pressure chronic ulcer of unspecified part of left lower leg with unspecified severity: Secondary | ICD-10-CM | POA: Diagnosis not present

## 2016-06-11 DIAGNOSIS — I129 Hypertensive chronic kidney disease with stage 1 through stage 4 chronic kidney disease, or unspecified chronic kidney disease: Secondary | ICD-10-CM | POA: Diagnosis not present

## 2016-06-11 DIAGNOSIS — I4891 Unspecified atrial fibrillation: Secondary | ICD-10-CM | POA: Diagnosis not present

## 2016-06-19 DIAGNOSIS — H9203 Otalgia, bilateral: Secondary | ICD-10-CM | POA: Diagnosis not present

## 2016-06-19 DIAGNOSIS — H9113 Presbycusis, bilateral: Secondary | ICD-10-CM | POA: Diagnosis not present

## 2016-06-19 DIAGNOSIS — H6123 Impacted cerumen, bilateral: Secondary | ICD-10-CM | POA: Diagnosis not present

## 2016-07-16 DIAGNOSIS — H353221 Exudative age-related macular degeneration, left eye, with active choroidal neovascularization: Secondary | ICD-10-CM | POA: Diagnosis not present

## 2016-08-05 DIAGNOSIS — M1711 Unilateral primary osteoarthritis, right knee: Secondary | ICD-10-CM | POA: Diagnosis not present

## 2016-08-15 ENCOUNTER — Other Ambulatory Visit: Payer: Self-pay | Admitting: Internal Medicine

## 2016-08-15 DIAGNOSIS — I10 Essential (primary) hypertension: Secondary | ICD-10-CM

## 2016-08-27 ENCOUNTER — Other Ambulatory Visit: Payer: Self-pay | Admitting: Internal Medicine

## 2016-08-27 NOTE — Telephone Encounter (Signed)
Age 83 Wt 82.1kg (05/26/2016)  Saw Dr Lovena Le 07/23/2015 Spoke with pt's granddaughter and instructed that she needs to make an appt with Dr Lovena Le as it has been a year since seen and she states will do so Labs from [facility] 03/30/2016 SrCr 1.6 and Hgb 11.3 Hct 36.7  CrCl 27.25  Refill done on Pradaxa 75mg  bid

## 2016-08-31 ENCOUNTER — Encounter: Payer: Self-pay | Admitting: Podiatry

## 2016-08-31 ENCOUNTER — Ambulatory Visit (INDEPENDENT_AMBULATORY_CARE_PROVIDER_SITE_OTHER): Payer: Medicare Other | Admitting: Podiatry

## 2016-08-31 DIAGNOSIS — B351 Tinea unguium: Secondary | ICD-10-CM | POA: Diagnosis not present

## 2016-08-31 DIAGNOSIS — L603 Nail dystrophy: Secondary | ICD-10-CM

## 2016-08-31 DIAGNOSIS — M79609 Pain in unspecified limb: Secondary | ICD-10-CM

## 2016-08-31 DIAGNOSIS — L608 Other nail disorders: Secondary | ICD-10-CM

## 2016-09-04 ENCOUNTER — Encounter: Payer: Self-pay | Admitting: Internal Medicine

## 2016-09-04 ENCOUNTER — Ambulatory Visit (INDEPENDENT_AMBULATORY_CARE_PROVIDER_SITE_OTHER): Payer: Medicare Other | Admitting: Internal Medicine

## 2016-09-04 VITALS — BP 120/60 | HR 57 | Ht 63.0 in | Wt 188.4 lb

## 2016-09-04 DIAGNOSIS — I482 Chronic atrial fibrillation, unspecified: Secondary | ICD-10-CM

## 2016-09-04 DIAGNOSIS — I10 Essential (primary) hypertension: Secondary | ICD-10-CM | POA: Diagnosis not present

## 2016-09-04 NOTE — Patient Instructions (Signed)

## 2016-09-04 NOTE — Progress Notes (Signed)
HPI Mrs. Greggs returns today for followup. She is a 83 year old woman with a history of chronic atrial fibrillation, hypertension, and venous insufficiency. In the interim she has done well. She denies chest pain , or syncope. She has class II dyspnea. She has chronic lower extremity edema. She denies palpitations. She has not been in the hospital and her swelling if anything is better. Allergies  Allergen Reactions  . Dust Mite Extract     Unknown  . Latex Itching    Unknown   . Mold Extract [Trichophyton Mentagrophyte]     Unknown     Current Outpatient Prescriptions  Medication Sig Dispense Refill  . dabigatran (PRADAXA) 75 MG CAPS capsule Take 75 mg by mouth every 12 (twelve) hours.    . fluocinonide cream (LIDEX) 0.05 % APPLY TO LEGS TWICE A DAY AS DIRECTED  3  . furosemide (LASIX) 20 MG tablet Take 2 tablets (40 mg total) by mouth daily. 60 tablet 12  . metoprolol tartrate (LOPRESSOR) 25 MG tablet Take 25 mg by mouth 2 (two) times daily.    . Multiple Vitamin (MULITIVITAMIN WITH MINERALS) TABS Take 1 tablet by mouth at bedtime.     . Multiple Vitamins-Minerals (ICAPS MV PO) Take 1 capsule by mouth 2 (two) times daily.     Marland Kitchen omeprazole (PRILOSEC) 40 MG capsule Take 40 mg by mouth daily.    . Probiotic Product (ALIGN PO) Take 1 capsule by mouth every morning.    Marland Kitchen spironolactone (ALDACTONE) 25 MG tablet Take 1 tablet (25 mg total) by mouth 3 (three) times a week. Monday Wednesday Friday 36 tablet 3  . timolol (TIMOPTIC) 0.5 % ophthalmic solution Place 1 drop into the left eye 2 (two) times daily.  12  . traMADol (ULTRAM) 50 MG tablet Take 1 tablet by mouth 3 (three) times daily as needed. Pain  3  . verapamil (VERELAN PM) 120 MG 24 hr capsule Take 1 capsule (120 mg total) by mouth 2 (two) times daily. 180 capsule 3  . vitamin C (ASCORBIC ACID) 500 MG tablet Take 500 mg by mouth 2 (two) times daily.     No current facility-administered medications for this visit.       Past Medical History:  Diagnosis Date  . Anemia   . Asthma   . Cellulitis   . Chronic acquired lymphedema   . Chronic venous insufficiency   . Dysrhythmia   . Hyperlipidemia   . Hypertension     ROS:   All systems reviewed and negative except as noted in the HPI.   Past Surgical History:  Procedure Laterality Date  . JOINT REPLACEMENT    . left total knee replacement    . right total hip replacement       Family History  Problem Relation Age of Onset  . Lung cancer Mother   . Other Father     unknown causes  . Breast cancer Neg Hx   . Diabetes Neg Hx      Social History   Social History  . Marital status: Widowed    Spouse name: N/A  . Number of children: N/A  . Years of education: N/A   Occupational History  . Not on file.   Social History Main Topics  . Smoking status: Former Smoker    Quit date: 07/28/1959  . Smokeless tobacco: Never Used  . Alcohol use No  . Drug use: No  . Sexual activity: Not on file  Other Topics Concern  . Not on file   Social History Narrative  . No narrative on file     BP 120/60   Pulse (!) 57   Ht 5\' 3"  (1.6 m)   Wt 188 lb 6.4 oz (85.5 kg)   BMI 33.37 kg/m   Physical Exam:  Well appearing elderly woman,NAD HEENT: Unremarkable Neck:  No JVD, no thyromegally Back:  No CVA tenderness Lungs:  Clear with no wheezes HEART:  IRegular rate rhythm, no murmurs, no rubs, no clicks Abd:  soft, positive bowel sounds, no organomegally, no rebound, no guarding Ext:  2 plus pulses, 2+ peripheral edema, no cyanosis, no clubbing Skin:  No rashes no nodules Neuro:  CN II through XII intact, motor grossly intact  EKG Atrial fibrillation with a controlled ventricular response.  Assess/Plan 1. Atrial fib - her ventricular rate is well controlled. She will continue her verapamil. 2. Coags - she is tolerating her Pradaxa very nicely. Continue. 3. Chronic diastolic heart failure - her symptoms are well controlled. She  is encouraged to maintain a low sodium diet.  Mikle Bosworth.D.

## 2016-09-06 NOTE — Progress Notes (Signed)
   SUBJECTIVE Patient  presents to office today complaining of elongated, thickened nails. Pain while ambulating in shoes. Patient is unable to trim their own nails.   OBJECTIVE General Patient is awake, alert, and oriented x 3 and in no acute distress. Derm Skin is dry and supple bilateral. Negative open lesions or macerations. Remaining integument unremarkable. Nails are tender, long, thickened and dystrophic with subungual debris, consistent with onychomycosis, 1-5 bilateral. No signs of infection noted. Vasc  DP and PT pedal pulses palpable bilaterally. Temperature gradient within normal limits.  Neuro Epicritic and protective threshold sensation diminished bilaterally.  Musculoskeletal Exam No symptomatic pedal deformities noted bilateral. Muscular strength within normal limits.  ASSESSMENT 1. Onychodystrophic nails 1-5 bilateral with hyperkeratosis of nails.  2. Onychomycosis of nail due to dermatophyte bilateral 3. Pain in foot bilateral  PLAN OF CARE 1. Patient evaluated today.  2. Instructed to maintain good pedal hygiene and foot care.  3. Mechanical debridement of nails 1-5 bilaterally performed using a nail nipper. Filed with dremel without incident.  4. Return to clinic in 3 mos.    Brent M. Evans, DPM [facility]  Dr. Brent M. Evans, DPM    [address]                                        [place], [place] [address]                Office [phone]  Fax [phone]      

## 2016-09-24 DIAGNOSIS — H353212 Exudative age-related macular degeneration, right eye, with inactive choroidal neovascularization: Secondary | ICD-10-CM | POA: Diagnosis not present

## 2016-09-24 DIAGNOSIS — Z961 Presence of intraocular lens: Secondary | ICD-10-CM | POA: Diagnosis not present

## 2016-09-24 DIAGNOSIS — H31011 Macula scars of posterior pole (postinflammatory) (post-traumatic), right eye: Secondary | ICD-10-CM | POA: Diagnosis not present

## 2016-09-24 DIAGNOSIS — H353221 Exudative age-related macular degeneration, left eye, with active choroidal neovascularization: Secondary | ICD-10-CM | POA: Diagnosis not present

## 2016-09-24 DIAGNOSIS — H401122 Primary open-angle glaucoma, left eye, moderate stage: Secondary | ICD-10-CM | POA: Diagnosis not present

## 2016-10-11 ENCOUNTER — Other Ambulatory Visit: Payer: Self-pay | Admitting: Internal Medicine

## 2016-10-19 DIAGNOSIS — N183 Chronic kidney disease, stage 3 (moderate): Secondary | ICD-10-CM | POA: Diagnosis not present

## 2016-10-19 DIAGNOSIS — Z6831 Body mass index (BMI) 31.0-31.9, adult: Secondary | ICD-10-CM | POA: Diagnosis not present

## 2016-10-19 DIAGNOSIS — I872 Venous insufficiency (chronic) (peripheral): Secondary | ICD-10-CM | POA: Diagnosis not present

## 2016-10-19 DIAGNOSIS — L0390 Cellulitis, unspecified: Secondary | ICD-10-CM | POA: Diagnosis not present

## 2016-10-19 DIAGNOSIS — I10 Essential (primary) hypertension: Secondary | ICD-10-CM | POA: Diagnosis not present

## 2016-10-20 DIAGNOSIS — D508 Other iron deficiency anemias: Secondary | ICD-10-CM | POA: Diagnosis not present

## 2016-10-20 DIAGNOSIS — Z87891 Personal history of nicotine dependence: Secondary | ICD-10-CM | POA: Diagnosis not present

## 2016-10-20 DIAGNOSIS — Z96653 Presence of artificial knee joint, bilateral: Secondary | ICD-10-CM | POA: Diagnosis not present

## 2016-10-20 DIAGNOSIS — L03115 Cellulitis of right lower limb: Secondary | ICD-10-CM | POA: Diagnosis not present

## 2016-10-20 DIAGNOSIS — L03116 Cellulitis of left lower limb: Secondary | ICD-10-CM | POA: Diagnosis not present

## 2016-10-20 DIAGNOSIS — N183 Chronic kidney disease, stage 3 (moderate): Secondary | ICD-10-CM | POA: Diagnosis not present

## 2016-10-20 DIAGNOSIS — I129 Hypertensive chronic kidney disease with stage 1 through stage 4 chronic kidney disease, or unspecified chronic kidney disease: Secondary | ICD-10-CM | POA: Diagnosis not present

## 2016-10-20 DIAGNOSIS — I8393 Asymptomatic varicose veins of bilateral lower extremities: Secondary | ICD-10-CM | POA: Diagnosis not present

## 2016-10-20 DIAGNOSIS — I4891 Unspecified atrial fibrillation: Secondary | ICD-10-CM | POA: Diagnosis not present

## 2016-10-20 DIAGNOSIS — Z7902 Long term (current) use of antithrombotics/antiplatelets: Secondary | ICD-10-CM | POA: Diagnosis not present

## 2016-10-21 DIAGNOSIS — I8393 Asymptomatic varicose veins of bilateral lower extremities: Secondary | ICD-10-CM | POA: Diagnosis not present

## 2016-10-21 DIAGNOSIS — L03116 Cellulitis of left lower limb: Secondary | ICD-10-CM | POA: Diagnosis not present

## 2016-10-21 DIAGNOSIS — N183 Chronic kidney disease, stage 3 (moderate): Secondary | ICD-10-CM | POA: Diagnosis not present

## 2016-10-21 DIAGNOSIS — L03115 Cellulitis of right lower limb: Secondary | ICD-10-CM | POA: Diagnosis not present

## 2016-10-21 DIAGNOSIS — I4891 Unspecified atrial fibrillation: Secondary | ICD-10-CM | POA: Diagnosis not present

## 2016-10-21 DIAGNOSIS — I129 Hypertensive chronic kidney disease with stage 1 through stage 4 chronic kidney disease, or unspecified chronic kidney disease: Secondary | ICD-10-CM | POA: Diagnosis not present

## 2016-10-24 DIAGNOSIS — L03115 Cellulitis of right lower limb: Secondary | ICD-10-CM | POA: Diagnosis not present

## 2016-10-24 DIAGNOSIS — N183 Chronic kidney disease, stage 3 (moderate): Secondary | ICD-10-CM | POA: Diagnosis not present

## 2016-10-24 DIAGNOSIS — I4891 Unspecified atrial fibrillation: Secondary | ICD-10-CM | POA: Diagnosis not present

## 2016-10-24 DIAGNOSIS — L03116 Cellulitis of left lower limb: Secondary | ICD-10-CM | POA: Diagnosis not present

## 2016-10-24 DIAGNOSIS — I129 Hypertensive chronic kidney disease with stage 1 through stage 4 chronic kidney disease, or unspecified chronic kidney disease: Secondary | ICD-10-CM | POA: Diagnosis not present

## 2016-10-24 DIAGNOSIS — I8393 Asymptomatic varicose veins of bilateral lower extremities: Secondary | ICD-10-CM | POA: Diagnosis not present

## 2016-10-27 DIAGNOSIS — L03116 Cellulitis of left lower limb: Secondary | ICD-10-CM | POA: Diagnosis not present

## 2016-10-27 DIAGNOSIS — I8393 Asymptomatic varicose veins of bilateral lower extremities: Secondary | ICD-10-CM | POA: Diagnosis not present

## 2016-10-27 DIAGNOSIS — I129 Hypertensive chronic kidney disease with stage 1 through stage 4 chronic kidney disease, or unspecified chronic kidney disease: Secondary | ICD-10-CM | POA: Diagnosis not present

## 2016-10-27 DIAGNOSIS — L03115 Cellulitis of right lower limb: Secondary | ICD-10-CM | POA: Diagnosis not present

## 2016-10-27 DIAGNOSIS — I4891 Unspecified atrial fibrillation: Secondary | ICD-10-CM | POA: Diagnosis not present

## 2016-10-27 DIAGNOSIS — N183 Chronic kidney disease, stage 3 (moderate): Secondary | ICD-10-CM | POA: Diagnosis not present

## 2016-10-30 DIAGNOSIS — I8393 Asymptomatic varicose veins of bilateral lower extremities: Secondary | ICD-10-CM | POA: Diagnosis not present

## 2016-10-30 DIAGNOSIS — L03115 Cellulitis of right lower limb: Secondary | ICD-10-CM | POA: Diagnosis not present

## 2016-10-30 DIAGNOSIS — I872 Venous insufficiency (chronic) (peripheral): Secondary | ICD-10-CM | POA: Diagnosis not present

## 2016-10-30 DIAGNOSIS — N183 Chronic kidney disease, stage 3 (moderate): Secondary | ICD-10-CM | POA: Diagnosis not present

## 2016-10-30 DIAGNOSIS — L03116 Cellulitis of left lower limb: Secondary | ICD-10-CM | POA: Diagnosis not present

## 2016-10-30 DIAGNOSIS — Z6831 Body mass index (BMI) 31.0-31.9, adult: Secondary | ICD-10-CM | POA: Diagnosis not present

## 2016-10-30 DIAGNOSIS — I129 Hypertensive chronic kidney disease with stage 1 through stage 4 chronic kidney disease, or unspecified chronic kidney disease: Secondary | ICD-10-CM | POA: Diagnosis not present

## 2016-10-30 DIAGNOSIS — I4891 Unspecified atrial fibrillation: Secondary | ICD-10-CM | POA: Diagnosis not present

## 2016-10-30 DIAGNOSIS — E784 Other hyperlipidemia: Secondary | ICD-10-CM | POA: Diagnosis not present

## 2016-10-30 DIAGNOSIS — H6123 Impacted cerumen, bilateral: Secondary | ICD-10-CM | POA: Diagnosis not present

## 2016-11-03 ENCOUNTER — Telehealth: Payer: Self-pay

## 2016-11-03 DIAGNOSIS — N183 Chronic kidney disease, stage 3 (moderate): Secondary | ICD-10-CM | POA: Diagnosis not present

## 2016-11-03 DIAGNOSIS — L03115 Cellulitis of right lower limb: Secondary | ICD-10-CM | POA: Diagnosis not present

## 2016-11-03 DIAGNOSIS — I8393 Asymptomatic varicose veins of bilateral lower extremities: Secondary | ICD-10-CM | POA: Diagnosis not present

## 2016-11-03 DIAGNOSIS — L03116 Cellulitis of left lower limb: Secondary | ICD-10-CM | POA: Diagnosis not present

## 2016-11-03 DIAGNOSIS — I129 Hypertensive chronic kidney disease with stage 1 through stage 4 chronic kidney disease, or unspecified chronic kidney disease: Secondary | ICD-10-CM | POA: Diagnosis not present

## 2016-11-03 DIAGNOSIS — I4891 Unspecified atrial fibrillation: Secondary | ICD-10-CM | POA: Diagnosis not present

## 2016-11-03 NOTE — Telephone Encounter (Signed)
Received PA fax for the pts Pradaxa 75 mg from [facility]. I have completed PA through covermymeds. I (quickly) received an approval massage.  I have faxed message back to [employer] stating that PA was approved.

## 2016-11-04 ENCOUNTER — Emergency Department (HOSPITAL_COMMUNITY): Payer: Medicare Other

## 2016-11-04 ENCOUNTER — Encounter (HOSPITAL_COMMUNITY): Payer: Self-pay | Admitting: *Deleted

## 2016-11-04 ENCOUNTER — Inpatient Hospital Stay (HOSPITAL_COMMUNITY)
Admission: EM | Admit: 2016-11-04 | Discharge: 2016-11-07 | DRG: 291 | Disposition: A | Discharge status: Home Health | Payer: Medicare Other | Attending: Internal Medicine | Admitting: Internal Medicine

## 2016-11-04 DIAGNOSIS — R7989 Other specified abnormal findings of blood chemistry: Secondary | ICD-10-CM | POA: Diagnosis present

## 2016-11-04 DIAGNOSIS — I5031 Acute diastolic (congestive) heart failure: Secondary | ICD-10-CM

## 2016-11-04 DIAGNOSIS — R609 Edema, unspecified: Secondary | ICD-10-CM | POA: Diagnosis not present

## 2016-11-04 DIAGNOSIS — D509 Iron deficiency anemia, unspecified: Secondary | ICD-10-CM | POA: Diagnosis present

## 2016-11-04 DIAGNOSIS — I248 Other forms of acute ischemic heart disease: Secondary | ICD-10-CM | POA: Diagnosis present

## 2016-11-04 DIAGNOSIS — I5033 Acute on chronic diastolic (congestive) heart failure: Secondary | ICD-10-CM | POA: Diagnosis present

## 2016-11-04 DIAGNOSIS — R079 Chest pain, unspecified: Secondary | ICD-10-CM | POA: Diagnosis not present

## 2016-11-04 DIAGNOSIS — I509 Heart failure, unspecified: Secondary | ICD-10-CM | POA: Diagnosis not present

## 2016-11-04 DIAGNOSIS — L03119 Cellulitis of unspecified part of limb: Secondary | ICD-10-CM | POA: Diagnosis not present

## 2016-11-04 DIAGNOSIS — Z87891 Personal history of nicotine dependence: Secondary | ICD-10-CM

## 2016-11-04 DIAGNOSIS — I081 Rheumatic disorders of both mitral and tricuspid valves: Secondary | ICD-10-CM | POA: Diagnosis present

## 2016-11-04 DIAGNOSIS — Z91040 Latex allergy status: Secondary | ICD-10-CM

## 2016-11-04 DIAGNOSIS — I272 Pulmonary hypertension, unspecified: Secondary | ICD-10-CM | POA: Diagnosis present

## 2016-11-04 DIAGNOSIS — R059 Cough, unspecified: Secondary | ICD-10-CM | POA: Diagnosis present

## 2016-11-04 DIAGNOSIS — Z79891 Long term (current) use of opiate analgesic: Secondary | ICD-10-CM | POA: Diagnosis not present

## 2016-11-04 DIAGNOSIS — Z96652 Presence of left artificial knee joint: Secondary | ICD-10-CM | POA: Diagnosis present

## 2016-11-04 DIAGNOSIS — R05 Cough: Secondary | ICD-10-CM | POA: Diagnosis not present

## 2016-11-04 DIAGNOSIS — I480 Paroxysmal atrial fibrillation: Secondary | ICD-10-CM

## 2016-11-04 DIAGNOSIS — Z801 Family history of malignant neoplasm of trachea, bronchus and lung: Secondary | ICD-10-CM

## 2016-11-04 DIAGNOSIS — I4891 Unspecified atrial fibrillation: Secondary | ICD-10-CM | POA: Diagnosis present

## 2016-11-04 DIAGNOSIS — J189 Pneumonia, unspecified organism: Secondary | ICD-10-CM

## 2016-11-04 DIAGNOSIS — I10 Essential (primary) hypertension: Secondary | ICD-10-CM | POA: Diagnosis present

## 2016-11-04 DIAGNOSIS — I5021 Acute systolic (congestive) heart failure: Secondary | ICD-10-CM | POA: Diagnosis not present

## 2016-11-04 DIAGNOSIS — I481 Persistent atrial fibrillation: Secondary | ICD-10-CM

## 2016-11-04 DIAGNOSIS — N183 Chronic kidney disease, stage 3 (moderate): Secondary | ICD-10-CM | POA: Diagnosis present

## 2016-11-04 DIAGNOSIS — Z7901 Long term (current) use of anticoagulants: Secondary | ICD-10-CM

## 2016-11-04 DIAGNOSIS — E785 Hyperlipidemia, unspecified: Secondary | ICD-10-CM | POA: Diagnosis present

## 2016-11-04 DIAGNOSIS — I872 Venous insufficiency (chronic) (peripheral): Secondary | ICD-10-CM | POA: Diagnosis present

## 2016-11-04 DIAGNOSIS — R0789 Other chest pain: Secondary | ICD-10-CM | POA: Diagnosis not present

## 2016-11-04 DIAGNOSIS — Z66 Do not resuscitate: Secondary | ICD-10-CM | POA: Diagnosis present

## 2016-11-04 DIAGNOSIS — I4581 Long QT syndrome: Secondary | ICD-10-CM | POA: Diagnosis present

## 2016-11-04 DIAGNOSIS — J45909 Unspecified asthma, uncomplicated: Secondary | ICD-10-CM | POA: Diagnosis present

## 2016-11-04 DIAGNOSIS — Z96641 Presence of right artificial hip joint: Secondary | ICD-10-CM | POA: Diagnosis present

## 2016-11-04 DIAGNOSIS — N179 Acute kidney failure, unspecified: Secondary | ICD-10-CM | POA: Diagnosis not present

## 2016-11-04 DIAGNOSIS — R0602 Shortness of breath: Secondary | ICD-10-CM | POA: Diagnosis not present

## 2016-11-04 DIAGNOSIS — I130 Hypertensive heart and chronic kidney disease with heart failure and stage 1 through stage 4 chronic kidney disease, or unspecified chronic kidney disease: Secondary | ICD-10-CM | POA: Diagnosis not present

## 2016-11-04 DIAGNOSIS — J181 Lobar pneumonia, unspecified organism: Secondary | ICD-10-CM

## 2016-11-04 DIAGNOSIS — Z79899 Other long term (current) drug therapy: Secondary | ICD-10-CM

## 2016-11-04 DIAGNOSIS — I4892 Unspecified atrial flutter: Secondary | ICD-10-CM | POA: Diagnosis present

## 2016-11-04 DIAGNOSIS — I5030 Unspecified diastolic (congestive) heart failure: Secondary | ICD-10-CM | POA: Diagnosis not present

## 2016-11-04 DIAGNOSIS — I482 Chronic atrial fibrillation: Secondary | ICD-10-CM | POA: Diagnosis present

## 2016-11-04 DIAGNOSIS — I110 Hypertensive heart disease with heart failure: Secondary | ICD-10-CM | POA: Diagnosis not present

## 2016-11-04 DIAGNOSIS — R072 Precordial pain: Secondary | ICD-10-CM

## 2016-11-04 DIAGNOSIS — I340 Nonrheumatic mitral (valve) insufficiency: Secondary | ICD-10-CM | POA: Diagnosis present

## 2016-11-04 DIAGNOSIS — Z91048 Other nonmedicinal substance allergy status: Secondary | ICD-10-CM

## 2016-11-04 DIAGNOSIS — I5023 Acute on chronic systolic (congestive) heart failure: Secondary | ICD-10-CM | POA: Diagnosis not present

## 2016-11-04 DIAGNOSIS — I959 Hypotension, unspecified: Secondary | ICD-10-CM | POA: Diagnosis present

## 2016-11-04 DIAGNOSIS — L8990 Pressure ulcer of unspecified site, unspecified stage: Secondary | ICD-10-CM | POA: Insufficient documentation

## 2016-11-04 LAB — COMPREHENSIVE METABOLIC PANEL
ALBUMIN: 3.8 g/dL (ref 3.5–5.0)
ALK PHOS: 65 U/L (ref 38–126)
ALT: 12 U/L — AB (ref 14–54)
ANION GAP: 12 (ref 5–15)
AST: 28 U/L (ref 15–41)
BUN: 59 mg/dL — ABNORMAL HIGH (ref 6–20)
CALCIUM: 9.5 mg/dL (ref 8.9–10.3)
CO2: 23 mmol/L (ref 22–32)
Chloride: 104 mmol/L (ref 101–111)
Creatinine, Ser: 1.73 mg/dL — ABNORMAL HIGH (ref 0.44–1.00)
GFR calc Af Amer: 28 mL/min — ABNORMAL LOW (ref >60)
GFR calc non Af Amer: 24 mL/min — ABNORMAL LOW (ref >60)
GLUCOSE: 109 mg/dL — AB (ref 65–99)
Potassium: 4.8 mmol/L (ref 3.5–5.1)
SODIUM: 139 mmol/L (ref 135–145)
Total Bilirubin: 1.3 mg/dL — ABNORMAL HIGH (ref 0.3–1.2)
Total Protein: 6.9 g/dL (ref 6.5–8.1)

## 2016-11-04 LAB — CBC WITH DIFFERENTIAL/PLATELET
BASOS ABS: 0.1 10*3/uL (ref 0.0–0.1)
Basophils Relative: 1 %
EOS ABS: 0.1 10*3/uL (ref 0.0–0.7)
Eosinophils Relative: 1 %
HCT: 36.7 % (ref 36.0–46.0)
HEMOGLOBIN: 11.4 g/dL — AB (ref 12.0–15.0)
LYMPHS PCT: 10 %
Lymphs Abs: 1.2 10*3/uL (ref 0.7–4.0)
MCH: 20.3 pg — ABNORMAL LOW (ref 26.0–34.0)
MCHC: 31.1 g/dL (ref 30.0–36.0)
MCV: 65.4 fL — ABNORMAL LOW (ref 78.0–100.0)
Monocytes Absolute: 1.2 10*3/uL — ABNORMAL HIGH (ref 0.1–1.0)
Monocytes Relative: 10 %
NEUTROS PCT: 78 %
Neutro Abs: 9.2 10*3/uL — ABNORMAL HIGH (ref 1.7–7.7)
Platelets: 180 10*3/uL (ref 150–400)
RBC: 5.61 MIL/uL — AB (ref 3.87–5.11)
RDW: 15.7 % — ABNORMAL HIGH (ref 11.5–15.5)
WBC: 11.8 10*3/uL — ABNORMAL HIGH (ref 4.0–10.5)

## 2016-11-04 LAB — D-DIMER, QUANTITATIVE: D-Dimer, Quant: 0.83 ug/mL-FEU — ABNORMAL HIGH (ref 0.00–0.50)

## 2016-11-04 LAB — BRAIN NATRIURETIC PEPTIDE: B Natriuretic Peptide: 499.2 pg/mL — ABNORMAL HIGH (ref 0.0–100.0)

## 2016-11-04 LAB — LACTIC ACID, PLASMA
LACTIC ACID, VENOUS: 1.6 mmol/L (ref 0.5–1.9)
LACTIC ACID, VENOUS: 1.8 mmol/L (ref 0.5–1.9)

## 2016-11-04 LAB — TROPONIN I
TROPONIN I: 0.07 ng/mL — AB (ref <0.03)
Troponin I: 0.05 ng/mL (ref <0.03)

## 2016-11-04 LAB — I-STAT TROPONIN, ED
TROPONIN I, POC: 0.03 ng/mL (ref 0.00–0.08)
TROPONIN I, POC: 0.03 ng/mL (ref 0.00–0.08)

## 2016-11-04 LAB — PROCALCITONIN: PROCALCITONIN: 0.74 ng/mL

## 2016-11-04 LAB — SEDIMENTATION RATE: Sed Rate: 25 mm/hr — ABNORMAL HIGH (ref 0–22)

## 2016-11-04 MED ORDER — LATANOPROST 0.005 % OP SOLN
1.0000 [drp] | Freq: Every day | OPHTHALMIC | Status: DC
  Administered 2016-11-04 – 2016-11-06 (×3): 1 [drp] via OPHTHALMIC
  Filled 2016-11-04: qty 2.5

## 2016-11-04 MED ORDER — FUROSEMIDE 10 MG/ML IJ SOLN
60.0000 mg | Freq: Once | INTRAMUSCULAR | Status: AC
  Administered 2016-11-04: 60 mg via INTRAVENOUS
  Filled 2016-11-04: qty 6

## 2016-11-04 MED ORDER — TRAMADOL HCL 50 MG PO TABS: 50.0000 mg | ORAL_TABLET | Freq: Two times a day (BID) | ORAL | Status: DC | PRN

## 2016-11-04 MED ORDER — ACETAMINOPHEN 325 MG PO TABS
650.0000 mg | ORAL_TABLET | ORAL | Status: DC | PRN
Start: 2016-11-04 — End: 2016-11-07
  Administered 2016-11-04 – 2016-11-06 (×4): 650 mg via ORAL
  Filled 2016-11-04 (×4): qty 2

## 2016-11-04 MED ORDER — AMOXICILLIN-POT CLAVULANATE 875-125 MG PO TABS
1.0000 | ORAL_TABLET | Freq: Once | ORAL | Status: AC
Start: 2016-11-04 — End: 2016-11-04
  Administered 2016-11-04: 1 via ORAL
  Filled 2016-11-04: qty 1

## 2016-11-04 MED ORDER — SODIUM CHLORIDE 0.9 % IV SOLN: 250.0000 mL | INTRAVENOUS | Status: DC | PRN

## 2016-11-04 MED ORDER — LORATADINE 10 MG PO TABS
10.0000 mg | ORAL_TABLET | Freq: Every day | ORAL | Status: DC
  Administered 2016-11-04 – 2016-11-06 (×3): 10 mg via ORAL
  Filled 2016-11-04 (×3): qty 1

## 2016-11-04 MED ORDER — DEXTROSE 5 % IV SOLN
1.0000 g | INTRAVENOUS | Status: DC
  Administered 2016-11-04 – 2016-11-06 (×3): 1 g via INTRAVENOUS
  Filled 2016-11-04 (×5): qty 10

## 2016-11-04 MED ORDER — SODIUM CHLORIDE 0.9% FLUSH
3.0000 mL | Freq: Two times a day (BID) | INTRAVENOUS | Status: DC
  Administered 2016-11-04 – 2016-11-05 (×2): 3 mL via INTRAVENOUS

## 2016-11-04 MED ORDER — VERAPAMIL HCL ER 120 MG PO CP24: 120.0000 mg | ORAL_CAPSULE | Freq: Two times a day (BID) | ORAL | Status: DC

## 2016-11-04 MED ORDER — AZITHROMYCIN 500 MG IV SOLR
500.0000 mg | INTRAVENOUS | Status: DC
  Administered 2016-11-04 – 2016-11-06 (×3): 500 mg via INTRAVENOUS
  Filled 2016-11-04 (×4): qty 500

## 2016-11-04 MED ORDER — METOPROLOL TARTRATE 25 MG PO TABS
25.0000 mg | ORAL_TABLET | Freq: Two times a day (BID) | ORAL | Status: DC
  Administered 2016-11-04 – 2016-11-07 (×6): 25 mg via ORAL
  Filled 2016-11-04 (×6): qty 1

## 2016-11-04 MED ORDER — SODIUM CHLORIDE 0.9% FLUSH: 3.0000 mL | INTRAVENOUS | Status: DC | PRN

## 2016-11-04 MED ORDER — DABIGATRAN ETEXILATE MESYLATE 75 MG PO CAPS
75.0000 mg | ORAL_CAPSULE | Freq: Two times a day (BID) | ORAL | Status: DC
  Administered 2016-11-04 – 2016-11-07 (×6): 75 mg via ORAL
  Filled 2016-11-04 (×6): qty 1

## 2016-11-04 MED ORDER — ONDANSETRON HCL 4 MG/2ML IJ SOLN: 4.0000 mg | Freq: Four times a day (QID) | INTRAMUSCULAR | Status: DC | PRN

## 2016-11-04 MED ORDER — PANTOPRAZOLE SODIUM 40 MG PO TBEC
40.0000 mg | DELAYED_RELEASE_TABLET | Freq: Every day | ORAL | Status: DC
  Administered 2016-11-04 – 2016-11-06 (×3): 40 mg via ORAL
  Filled 2016-11-04 (×3): qty 1

## 2016-11-04 MED ORDER — CLINDAMYCIN HCL 300 MG PO CAPS: 300.0000 mg | ORAL_CAPSULE | Freq: Three times a day (TID) | ORAL | Status: DC

## 2016-11-04 MED ORDER — TIMOLOL MALEATE 0.5 % OP SOLN
1.0000 [drp] | Freq: Two times a day (BID) | OPHTHALMIC | Status: DC
  Administered 2016-11-05 – 2016-11-07 (×6): 1 [drp] via OPHTHALMIC
  Filled 2016-11-04 (×2): qty 5

## 2016-11-04 MED ORDER — VERAPAMIL HCL ER 120 MG PO TBCR
120.0000 mg | EXTENDED_RELEASE_TABLET | Freq: Two times a day (BID) | ORAL | Status: DC
  Filled 2016-11-04: qty 1

## 2016-11-04 MED ORDER — PROCHLORPERAZINE EDISYLATE 5 MG/ML IJ SOLN
5.0000 mg | Freq: Three times a day (TID) | INTRAMUSCULAR | Status: DC | PRN
  Filled 2016-11-04: qty 1

## 2016-11-04 MED ORDER — ALBUTEROL SULFATE (2.5 MG/3ML) 0.083% IN NEBU: 2.5000 mg | INHALATION_SOLUTION | Freq: Four times a day (QID) | RESPIRATORY_TRACT | Status: DC | PRN

## 2016-11-04 NOTE — ED Notes (Signed)
Pt wanting to know what is going on -- states she does not want to walk in hallway-- but will try.

## 2016-11-04 NOTE — ED Provider Notes (Signed)
[facility] DEPT Provider Note   CSN: [id number] Arrival date & time: 11/04/16  4098     History   Chief Complaint Chief Complaint  Patient presents with  . Chest Pain    HPI Lori [name] is a 83 y.o. female.  HPI Pt is a 83 yo female with a hx of chronic afib presenting to the ER with CP that began this AM. Associated with coughing. No significant SOB. CP is worse with deep breathing.  No hx of CAD per patient. She lives independently. No recent illness or fever. Does report new productive cough today. Denies orthopnea.  She does have a history of diastolic congestive heart failure.  Her cardiologist is Dr. Cristopher Peru.  No other complaints at this time.  No unilateral leg swelling.  No history DVT or pulmonary embolism     Past Medical History:  Diagnosis Date  . Anemia   . Asthma   . Cellulitis   . Chronic acquired lymphedema   . Chronic venous insufficiency   . Dysrhythmia   . Hyperlipidemia   . Hypertension     Patient Active Problem List   Diagnosis Date Noted  . Hyponatremia 07/13/2012    Class: Acute  . Essential hypertension 03/31/2012  . Cellulitis of leg 11/11/2011    Class: Acute  . Atrial fibrillation ([place]) 07/29/2011  . Peripheral edema 07/29/2011  . Cough 06/26/2010  . ANEMIA, IRON DEFICIENCY 06/10/2010  . ANEMIA-UNSPECIFIED 06/10/2010  . ASTHMA UNSPECIFIED 06/10/2010  . PERSONAL HX COLONIC POLYPS 06/10/2010    Past Surgical History:  Procedure Laterality Date  . JOINT REPLACEMENT    . left total knee replacement    . right total hip replacement      OB History    No data available       Home Medications    Prior to Admission medications   Medication Sig Start Date End Date Taking? Authorizing Provider  dabigatran (PRADAXA) 75 MG CAPS capsule Take 75 mg by mouth every 12 (twelve) hours.   Yes Historical Provider, MD  fluocinonide cream (LIDEX) 0.05 % APPLY TO LEGS TWICE A DAY AS DIRECTED 02/08/15  Yes Historical Provider, MD    furosemide (LASIX) 20 MG tablet Take 2 tablets (40 mg total) by mouth daily. 03/31/12  Yes Evans Lance, MD  metoprolol tartrate (LOPRESSOR) 25 MG tablet Take 25 mg by mouth 2 (two) times daily.   Yes Historical Provider, MD  Multiple Vitamin (MULITIVITAMIN WITH MINERALS) TABS Take 1 tablet by mouth at bedtime.    Yes Historical Provider, MD  Multiple Vitamins-Minerals (ICAPS MV PO) Take 1 capsule by mouth 2 (two) times daily.    Yes Historical Provider, MD  omeprazole (PRILOSEC) 40 MG capsule Take 40 mg by mouth daily.   Yes Historical Provider, MD  Probiotic Product (ALIGN PO) Take 1 capsule by mouth every morning.   Yes Historical Provider, MD  spironolactone (ALDACTONE) 25 MG tablet Take 1 tablet (25 mg total) by mouth 3 (three) times a week. Monday Wednesday Friday 05/30/14  Yes Evans Lance, MD  timolol (TIMOPTIC) 0.5 % ophthalmic solution Place 1 drop into the left eye 2 (two) times daily. 05/05/14  Yes Historical Provider, MD  traMADol (ULTRAM) 50 MG tablet Take 1 tablet by mouth 3 (three) times daily as needed. Pain 04/12/14  Yes Historical Provider, MD  verapamil (VERELAN PM) 120 MG 24 hr capsule TAKE 1 CAPSULE TWICE A DAY 10/12/16  Yes Evans Lance, MD  vitamin C (  ASCORBIC ACID) 500 MG tablet Take 500 mg by mouth 2 (two) times daily.   Yes Historical Provider, MD    Family History Family History  Problem Relation Age of Onset  . Lung cancer Mother   . Other Father     unknown causes  . Breast cancer Neg Hx   . Diabetes Neg Hx     Social History Social History  Substance Use Topics  . Smoking status: Former Smoker    Quit date: 07/28/1959  . Smokeless tobacco: Never Used  . Alcohol use No     Allergies   Dust mite extract; Latex; and Mold extract [trichophyton mentagrophyte]   Review of Systems Review of Systems  All other systems reviewed and are negative.    Physical Exam Updated Vital Signs BP 117/62 (BP Location: Right Arm)   Pulse 97   Temp (S) 99.5 F  (37.5 C) (Oral)   Resp (!) 28   SpO2 94%   Physical Exam  Constitutional: She is oriented to person, place, and time. She appears well-developed and well-nourished. No distress.  HENT:  Head: Normocephalic and atraumatic.  Eyes: EOM are normal.  Neck: Normal range of motion.  Cardiovascular: Normal rate, regular rhythm and normal heart sounds.   Pulmonary/Chest: Effort normal and breath sounds normal.  Abdominal: Soft. She exhibits no distension. There is no tenderness.  Musculoskeletal: Normal range of motion.  Neurological: She is alert and oriented to person, place, and time.  Skin: Skin is warm and dry.  Psychiatric: She has a normal mood and affect. Judgment normal.  Nursing note and vitals reviewed.    ED Treatments / Results  Labs (all labs ordered are listed, but only abnormal results are displayed) Labs Reviewed  CBC WITH DIFFERENTIAL/PLATELET - Abnormal; Notable for the following:       Result Value   WBC 11.8 (*)    RBC 5.61 (*)    Hemoglobin 11.4 (*)    MCV 65.4 (*)    MCH 20.3 (*)    RDW 15.7 (*)    Neutro Abs 9.2 (*)    Monocytes Absolute 1.2 (*)    All other components within normal limits  COMPREHENSIVE METABOLIC PANEL - Abnormal; Notable for the following:    Glucose, Bld 109 (*)    BUN 59 (*)    Creatinine, Ser 1.73 (*)    ALT 12 (*)    Total Bilirubin 1.3 (*)    GFR calc non Af Amer 24 (*)    GFR calc Af Amer 28 (*)    All other components within normal limits  D-DIMER, QUANTITATIVE (NOT AT [facility]) - Abnormal; Notable for the following:    D-Dimer, Quant 0.83 (*)    All other components within normal limits  BRAIN NATRIURETIC PEPTIDE - Abnormal; Notable for the following:    B Natriuretic Peptide 499.2 (*)    All other components within normal limits  I-STAT TROPOININ, ED  I-STAT TROPOININ, ED   BUN  Date Value Ref Range Status  11/04/2016 59 (H) 6 - 20 mg/dL Final  07/13/2012 35 (H) 6 - 23 mg/dL Final  07/12/2012 39 (H) 6 - 23 mg/dL Final    07/11/2012 44 (H) 6 - 23 mg/dL Final   Creatinine, Ser  Date Value Ref Range Status  11/04/2016 1.73 (H) 0.44 - 1.00 mg/dL Final  07/13/2012 1.18 (H) 0.50 - 1.10 mg/dL Final  07/12/2012 1.40 (H) 0.50 - 1.10 mg/dL Final  07/11/2012 1.90 (H) 0.50 - 1.10 mg/dL  Final      EKG  EKG Interpretation  Date/Time:  Wednesday Nov 04 2016 09:26:38 EDT Ventricular Rate:  90 PR Interval:    QRS Duration: 94 QT Interval:  410 QTC Calculation: 502 R Axis:   172 Text Interpretation:  Atrial fibrillation Lateral infarct, old Prolonged QT interval No significant change was found Confirmed by Ab Leaming  MD, Lennette Bihari ([id number]) on 11/04/2016 2:00:52 PM       Radiology Dg Chest 2 View  Result Date: 11/04/2016 CLINICAL DATA:  Awakened this morning with strong productive cough with subsequent development of chest pain and shortness of breath. History of asthma, allergies, atrial fibrillation, former smoker. EXAM: CHEST  2 VIEW COMPARISON:  Chest x-ray of July 09, 2012 FINDINGS: The lungs are well-expanded. The interstitial markings are coarse. There it is increased density at both lung bases. The cardiac silhouette is enlarged. The pulmonary vascularity is mildly engorged. There is calcification in the wall of the aortic arch. There may be small pleural effusions bilaterally. The bony thorax exhibits no acute abnormality. IMPRESSION: CHF with mild pulmonary vascular congestion and interstitial edema. Probable small bilateral pleural effusions. Bibasilar atelectasis or pneumonia. Followup PA and lateral chest X-ray is recommended in 3-4 weeks following trial of antibiotic therapy to ensure resolution. Electronically Signed   By: David  Martinique M.D.   On: 11/04/2016 10:13    Procedures Procedures (including critical care time)  Medications Ordered in ED Medications  furosemide (LASIX) injection 60 mg (not administered)  amoxicillin-clavulanate (AUGMENTIN) 875-125 MG per tablet 1 tablet (1 tablet Oral Given 11/04/16  1230)     Initial Impression / Assessment and Plan / ED Course  I have reviewed the triage vital signs and the nursing notes.  Pertinent labs & imaging results that were available during my care of the patient were reviewed by me and considered in my medical decision making (see chart for details).     Patient given Augmentin for possible pneumonia but while in the emergency department developed what is likely a rapid atrial flutter with rates up in the 200s.  She was asymptomatic from this.  There is question as to whether or not she could have a component of volume overload.  IV Lasix will be given.  Given her ongoing chest discomfort cardiology will be involved.  Overall well-appearing.  Age adjusted d-dimer is normal  Final Clinical Impressions(s) / ED Diagnoses   Final diagnoses:  Precordial chest pain  Diastolic congestive heart failure, unspecified HF chronicity ([place])    New Prescriptions New Prescriptions   No medications on file     Jola Schmidt, MD 11/04/16 [phone]

## 2016-11-04 NOTE — ED Notes (Addendum)
Lori [name]  [phone] (son)  [name]--  All have permission to receive information regarding pt's condition--- please give updates on condition.

## 2016-11-04 NOTE — ED Notes (Addendum)
[name]: [phone] Pt family member upset due to RN not giving results over the phone. Pt and family member informed Dr. Venora Maples will call once results are back and we have more information to go on. Pt ok with this plan.

## 2016-11-04 NOTE — ED Notes (Signed)
Pt ambulated in hall -- with assistance -- without difficulty.

## 2016-11-04 NOTE — H&P (Signed)
History and Physical    Lori [name] WEX:[id number] DOB: [birth year] DOA: 11/04/2016   PCP: Donnajean Lopes, MD   Patient coming from/Resides with: Private residence/lives alone  Admission status: Inpatient/telemetry -medically necessary to stay a minimum 2 midnights to rule out impending and/or unexpected changes in physiologic status that may differ from initial evaluation performed in the ER and/or at time of admission. Presents with atypical chest pain with episodic rapid atrial flutter in patient his chronic atrial fibrillation. Also had low-grade fevers and chest x-ray inconclusive for heart failure, pneumonia or both. Has been evaluated by cardiology. Plans are to treat as heart failure and pneumonia and narrow treatment as evaluation is completed. Patient will require telemetry monitoring, daily weights, intake and output, she has been given 1 dose of IV Lasix and may require repeat doses pending follow-up chest x-ray and response to diuretic. She will also require inpatient echocardiogram since this was last completed in 2011.  Chief Complaint: Chest pain  HPI: Lori [name] is a 83 y.o. female with medical history significant for chronic lower extremity edema with recurrent cellulitis currently utilizing UNNA boots, known urinary hypertension with tricuspid regurgitation as well as moderate mitral regurgitation based on tachycardia in 2011, current treatment for cellulitis of both legs, chronic atrial fibrillation on Pradaxa, dyslipidemia, hypertension and iron deficiency anemia. Patient reports that she was in her usual state of health when she woke up around 3 AM with coughing episode. She felt congested in her throat and was able to expectorate clear drainage. After that she developed bandlike chest discomfort in the lower rib cage/epigastric region. She has not had any shortness of breath since that time. She reported to the ER where EKG was nonischemic and initial troponin was  negative. She did have an elevated BNP and chest x-ray showed findings of either early pneumonia or volume overload and possible evolving pleural effusion. She was given Lasix in the ER. Cardiology also consulted on the patient in the ER and has recommended to reevaluate in a.m. with likely resumption of Lasix. Patient reported an 8 pound weight gain to the cardiologist. She will also undergo repeat echocardiogram this admission. She did have an elevated d-dimer but with age adjustments and a very low Well's score she was at low risk for PE so additional evaluation was not undertaken. She has not had any further rapid ventricular response in regards to her A. fib/flutter and it is felt that her chest pain is atypical but they did recommend continue cycling troponin as a precaution. Pneumonia also not excluded in the agreed with treatment with antibiotics initially.  ED Course:  Vital Signs: BP 117/62   Pulse 89   Temp (S) (!) 100.6 F (38.1 C) (Rectal)   Resp (!) 29   SpO2 (!) 89%  2 view  CXR: CHF with mild pulmonary vascular congestion and interstitial edema and probable bilateral pleural effusions. Also by basilar atelectasis or pneumonia. Lab data: Sodium 139, potassium 4.8, chloride 14, CO2 23, glucose 109, BUN 59, creatinine 1.73, LFTs not elevated except for mild elevation total bilirubin 1.3, BNP 499, POC troponin 0.032 collections, white count 11,800 with neutrophils 78% and absolute feels 9.2%, hemoglobin 1.4, platelets 180,000, d-dimer 0.83 Medications and treatments: Augmentin 875-125 mg 1, Lasix 60 mg IV 1  Review of Systems:  In addition to the HPI above,  No Fever-chills, myalgias or other constitutional symptoms No Headache, changes with Vision or hearing, new weakness, tingling, numbness in any extremity, dizziness, dysarthria or word  finding difficulty, gait disturbance or imbalance, tremors or seizure activity No problems swallowing food or Liquids, indigestion/reflux, choking  or coughing while eating, abdominal pain with or after eating No Shortness of Breath, palpitations, orthopnea or DOE No Abdominal pain, N/V, melena,hematochezia, dark tarry stools, constipation No dysuria, malodorous urine, hematuria or flank pain No new skin rashes, lesions, masses or bruises, No new joint pains, aches, swelling or redness No recent unintentional weight gain or loss No polyuria, polydypsia or polyphagia   Past Medical History:  Diagnosis Date  . Anemia   . Asthma   . Cellulitis   . Chronic acquired lymphedema   . Chronic venous insufficiency   . Dysrhythmia   . Hyperlipidemia   . Hypertension     Past Surgical History:  Procedure Laterality Date  . JOINT REPLACEMENT    . left total knee replacement    . right total hip replacement      Social History   Social History  . Marital status: Widowed    Spouse name: N/A  . Number of children: N/A  . Years of education: N/A   Occupational History  . Not on file.   Social History Main Topics  . Smoking status: Former Smoker    Quit date: 07/28/1959  . Smokeless tobacco: Never Used  . Alcohol use No  . Drug use: No  . Sexual activity: Not on file   Other Topics Concern  . Not on file   Social History Narrative  . No narrative on file    Mobility: Rolling walker Work history: Not obtained   Allergies  Allergen Reactions  . Dust Mite Extract Shortness Of Breath and Itching  . Mold Extract [Trichophyton Mentagrophyte] Shortness Of Breath, Itching and Cough    VERY ALLERGIC/EXPOSURE RESULTS IN TREMENDOUS COUGHING (just one symptom)  . Latex Itching    And certain fabrics  . Tape Other (See Comments)    SKIN IS VERY THIN AND WILL TEAR AND BRUISE EASILY!! PLEASE USE COBAN WRAP-    Family History  Problem Relation Age of Onset  . Lung cancer Mother   . Other Father     unknown causes  . Breast cancer Neg Hx   . Diabetes Neg Hx      Prior to Admission medications   Medication Sig Start  Date End Date Taking? Authorizing Provider  albuterol (PROVENTIL HFA) 108 (90 Base) MCG/ACT inhaler Inhale 2 puffs into the lungs every 6 (six) hours as needed for wheezing or shortness of breath.   Yes Historical Provider, MD  clindamycin (CLEOCIN) 300 MG capsule Take 300 mg by mouth 3 (three) times daily. 10/30/16  Yes Historical Provider, MD  dabigatran (PRADAXA) 75 MG CAPS capsule Take 75 mg by mouth every 12 (twelve) hours.   Yes Historical Provider, MD  furosemide (LASIX) 20 MG tablet Take 2 tablets (40 mg total) by mouth daily. 03/31/12  Yes Evans Lance, MD  loratadine (CLARITIN) 10 MG tablet Take 10 mg by mouth at bedtime.   Yes Historical Provider, MD  metoprolol tartrate (LOPRESSOR) 25 MG tablet Take 25 mg by mouth 2 (two) times daily.   Yes Historical Provider, MD  Multiple Vitamins-Minerals (ICAPS MV PO) Take 1 capsule by mouth daily.    Yes Historical Provider, MD  omeprazole (PRILOSEC) 40 MG capsule Take 40 mg by mouth 2 (two) times daily.    Yes Historical Provider, MD  Probiotic Product (ALIGN PO) Take 1 capsule by mouth 2 (two) times daily.  Yes Historical Provider, MD  spironolactone (ALDACTONE) 25 MG tablet Take 1 tablet (25 mg total) by mouth 3 (three) times a week. Monday Wednesday Friday Patient taking differently: Take 50 mg by mouth every Monday, Wednesday, and Friday.  05/30/14  Yes Evans Lance, MD  timolol (TIMOPTIC) 0.5 % ophthalmic solution Place 1 drop into the left eye 2 (two) times daily. 05/05/14  Yes Historical Provider, MD  traMADol (ULTRAM) 50 MG tablet Take 1 tablet by mouth See admin instructions. TWO TO THREE TIMES A DAY 04/12/14  Yes Historical Provider, MD  Travoprost, BAK Free, (TRAVATAN Z) 0.004 % SOLN ophthalmic solution Place 1 drop into both eyes at bedtime.  09/24/16  Yes Historical Provider, MD  verapamil (VERELAN PM) 120 MG 24 hr capsule TAKE 1 CAPSULE TWICE A DAY 10/12/16  Yes Evans Lance, MD  vitamin C (ASCORBIC ACID) 500 MG tablet Take 500 mg by  mouth daily.    Yes Historical Provider, MD  DEBROX 6.5 % otic solution Place 5 drops into both ears See admin instructions. PLACE 5 DROPS INTO EACH EAR TWICE A DAY FOR FIVE DAYS TO PREP FOR IRRIGATION NEXT WEEK 10/30/16   Historical Provider, MD  fluocinonide cream (LIDEX) 0.05 % APPLY TO LEGS TWICE A DAY AS DIRECTED 02/08/15   Historical Provider, MD  Multiple Vitamin (MULITIVITAMIN WITH MINERALS) TABS Take 1 tablet by mouth at bedtime.     Historical Provider, MD    Physical Exam: Vitals:   11/04/16 1141 11/04/16 1229 11/04/16 1400 11/04/16 1512  BP: 138/74 117/62 (!) 109/54 117/62  Pulse: 81 97 86 89  Resp: 16 (!) 28 (!) 30 (!) 29  Temp:  (S) 99.5 F (37.5 C)  (S) (!) 100.6 F (38.1 C)  TempSrc:  Oral  Rectal  SpO2: 91% 94% (!) 88% (!) 89%      Constitutional: NAD, calm, comfortable Eyes: PERRL, lids and conjunctivae normal ENMT: Mucous membranes are moist. Posterior pharynx clear of any exudate or lesions.Normal dentition.  Neck: normal, supple, no masses, no thyromegaly Respiratory: clear to auscultation bilaterally, no wheezing, no crackles. Normal respiratory effort. No accessory muscle use.  Cardiovascular: Slightly irregular rate with atrial fibrillation rhythm, grade 2/6 systolic murmur left sternal border fourth intercostal space consistent with known mitral regurgitation; no rubs / gallops. Chronic appearing bilateral lower extremity edema with Unna boots in place-I did lift up Unna boot dressings and did not observe any significant erythema on the skin beneath the dressings. 2+ pedal pulses. No carotid bruits.  Abdomen: no tenderness, no masses palpated. No hepatosplenomegaly. Bowel sounds positive.  Musculoskeletal: no clubbing / cyanosis. No joint deformity upper and lower extremities. Good ROM, no contractures. Normal muscle tone.  Skin: no rashes, lesions, ulcers. No induration Neurologic: CN 2-12 grossly intact. Sensation intact, DTR normal. Strength 5/5 x all 4  extremities.  Psychiatric: Normal judgment and insight. Alert and oriented x 3. Normal mood.    Labs on Admission: I have personally reviewed following labs and imaging studies  CBC:  Recent Labs Lab 11/04/16 0952  WBC 11.8*  NEUTROABS 9.2*  HGB 11.4*  HCT 36.7  MCV 65.4*  PLT 299   Basic Metabolic Panel:  Recent Labs Lab 11/04/16 0952  NA 139  K 4.8  CL 104  CO2 23  GLUCOSE 109*  BUN 59*  CREATININE 1.73*  CALCIUM 9.5   GFR: CrCl cannot be calculated (Unknown ideal weight.). Liver Function Tests:  Recent Labs Lab 11/04/16 0952  AST 28  ALT  12*  ALKPHOS 65  BILITOT 1.3*  PROT 6.9  ALBUMIN 3.8   No results for input(s): LIPASE, AMYLASE in the last 168 hours. No results for input(s): AMMONIA in the last 168 hours. Coagulation Profile: No results for input(s): INR, PROTIME in the last 168 hours. Cardiac Enzymes: No results for input(s): CKTOTAL, CKMB, CKMBINDEX, TROPONINI in the last 168 hours. BNP (last 3 results) No results for input(s): PROBNP in the last 8760 hours. HbA1C: No results for input(s): HGBA1C in the last 72 hours. CBG: No results for input(s): GLUCAP in the last 168 hours. Lipid Profile: No results for input(s): CHOL, HDL, LDLCALC, TRIG, CHOLHDL, LDLDIRECT in the last 72 hours. Thyroid Function Tests: No results for input(s): TSH, T4TOTAL, FREET4, T3FREE, THYROIDAB in the last 72 hours. Anemia Panel: No results for input(s): VITAMINB12, FOLATE, FERRITIN, TIBC, IRON, RETICCTPCT in the last 72 hours. Urine analysis:    Component Value Date/Time   COLORURINE AMBER (A) 07/10/2012 1122   APPEARANCEUR CLOUDY (A) 07/10/2012 1122   LABSPEC 1.025 07/10/2012 1122   PHURINE 5.0 07/10/2012 1122   GLUCOSEU NEGATIVE 07/10/2012 1122   HGBUR NEGATIVE 07/10/2012 1122   BILIRUBINUR NEGATIVE 07/10/2012 1122   KETONESUR 15 (A) 07/10/2012 1122   PROTEINUR NEGATIVE 07/10/2012 1122   UROBILINOGEN 0.2 07/10/2012 1122   NITRITE NEGATIVE 07/10/2012 1122    LEUKOCYTESUR NEGATIVE 07/10/2012 1122   Sepsis Labs: @LABRCNTIP (procalcitonin:4,lacticidven:4) )No results found for this or any previous visit (from the past 240 hour(s)).   Radiological Exams on Admission: Dg Chest 2 View  Result Date: 11/04/2016 CLINICAL DATA:  Awakened this morning with strong productive cough with subsequent development of chest pain and shortness of breath. History of asthma, allergies, atrial fibrillation, former smoker. EXAM: CHEST  2 VIEW COMPARISON:  Chest x-ray of July 09, 2012 FINDINGS: The lungs are well-expanded. The interstitial markings are coarse. There it is increased density at both lung bases. The cardiac silhouette is enlarged. The pulmonary vascularity is mildly engorged. There is calcification in the wall of the aortic arch. There may be small pleural effusions bilaterally. The bony thorax exhibits no acute abnormality. IMPRESSION: CHF with mild pulmonary vascular congestion and interstitial edema. Probable small bilateral pleural effusions. Bibasilar atelectasis or pneumonia. Followup PA and lateral chest X-ray is recommended in 3-4 weeks following trial of antibiotic therapy to ensure resolution. Electronically Signed   By: David  Martinique M.D.   On: 11/04/2016 10:13    EKG: (Independently reviewed) Atrial fibrillation with ventricular rate 90 bpm, QTC 502 ms, no acute ischemic changes  Assessment/Plan Principal Problem:   Chest pain -Atypical in nature-cardiology agrees-reproducible with palpation -For completeness of exam cycle troponin -Follow up on echocardiogram (see below)  Active Problems:   Elevated brain natriuretic peptide (BNP) level/Moderate mitral regurgitation/Pulmonary HTN  -Patient presents with chest pain without shortness of breath with episodic atrial flutter with rapid rate that resolved spontaneously -chest x-ray findings consistent with likely volume overload although pneumonia not excluded -Given one-time IV Lasix 60 mg in  ER-cardiology recommends reevaluation regarding administration of further diuretics in a.m. -Holding home Lasix and Aldactone for now -Last echocardiogram 2011 with preserved LVEF, moderate mitral regurgitation, moderate tricuspid regurgitation mild pulmonary hypertension 45 mmHg-no mention if any hypertrophy or LVH -Cardiology has ordered echocardiogram for this admission -Patient did report 8 pound weight gain recently    Cough -Presented with cough followed by chest pain with productive clear sputum -Also low-grade fever with leukocytosis and left shift -For now given abnormal chest x-ray will  treat as possible community acquired pneumonia -Sputum culture -Blood cultures -Urinary strep -Empiric Rocephin/Zithromax -Procalcitonin /lactic acid -Respiratory viral panel -Repeat chest x-ray in a.m.-if abnormality show improvement after diuretics this is likely not an infectious process from pneumonia -In addition, because of fever check urinalysis and culture    Atrial fibrillation  -Currently rate controlled -Continue verapamil -Patient's blood pressure somewhat suboptimal although family member reports her baseline when laying can be 80/60 and increased with activity but for now we'll hold beta blocker and can resume if patient remains stable -Continue Protonix -CHADVASc =5 -Follow up on echocardiogram    Peripheral edema/Cellulitis of leg -Presents with Unna boots in place -Currently being treated for presumed cellulitis with Cleocin-we will continue this for now    Acute kidney injury  -Baseline renal function: 35 and 1.18 -Current renal function 59 and 1.73 -Acute kidney injury findings could be indicative of low renal perfusion from acute heart failure and for undetected rapid atrial dysrhythmia -Repeat labs in a.m. -If renal function improves with rate control and diuresis likely AK I related to heart failure and dysrhythmia -If not improved may need to hold diuretic and  obtain renal ultrasound especially if patient has abnormal urinalysis     ANEMIA, IRON DEFICIENCY -Baseline hemoglobin dating back to 2014 between 9.7 and 10.5 -Current hemoglobin 11.4    Essential hypertension -Current blood pressure has ranged between 90 and 956 systolic -Continue verapamil but holding beta blocker as documented above    HLD (hyperlipidemia)  -Was not on statin or other medications prior to admission      DVT prophylaxis: Protonix Code Status: DO NOT RESUSCITATE  Family Communication: [place] daughter by telephone Disposition Plan: Home Consults called: Cardiology/[place]    Samella Parr ANP-BC [facility] Pager [phone]   If 7PM-7AM, please contact night-coverage [email or web address] Password [email or web address]  11/04/2016, 4:09 PM

## 2016-11-04 NOTE — Consult Note (Signed)
Patient ID: Lori [name] MRN: [id number] DOB/AGE: [birth year] 83 y.o.  Admit date: 11/04/2016 Referring Physician: EDP Primary Physician: Dr. Sharlett Iles Primary Cardiologist: Dr Lovena Le Reason for Consultation: Chest pain, atrial fibrillation  HPI:  This is a 83 y.o. woman with PMHx significant for chronic atrial fibrillation, hypertension, chronic diastolic HF, renal insufficiency, microcytic anemia, chronic venous insufficiency presenting with chest pain.  Onset of symptoms was 3am today after a coughing episode and is worsened with deeper inspiration, palpation, and some movements.  She went to bed last night feeling well other than some fatigue.  She reports no recent illness or fever.  She reports daily clearance of morning phlegm but today there was more which resulted in coughing.  She reports chronic orthopnea that is unchanged.  She denies PND.  She reports no change in her exertional capacity and ambulates with a walker.  She has chronic lower extremity edema that is not changed.  She denies melena, abdominal pain, urinary complaints, nausea or vomiting.    In the ED, she was given Augmentin for possible pneumonia and 1 time dose of IV Lasix.  She developed a rapid HR up to the 200s of potential atrial flutter that resolved without further intervention.  Current HR in the 80-90's.  She was asymptomatic during episode.  Lately, she has not experienced any palpitations or pre-syncope.  She denies any history of known CAD or prior cardiac cath.  She has an Echo from 2013 with LVEF 61%, RV with mild to moderate concentric hypertrophy, mild MR, mild to moderate TR, and moderate pulmonary HTN.   Past Medical History:  Diagnosis Date  . Anemia   . Asthma   . Cellulitis   . Chronic acquired lymphedema   . Chronic venous insufficiency   . Dysrhythmia   . Hyperlipidemia   . Hypertension     Family History  Problem Relation Age of Onset  . Lung cancer Mother   . Other Father    unknown causes  . Breast cancer Neg Hx   . Diabetes Neg Hx     Social History   Social History  . Marital status: Widowed    Spouse name: N/A  . Number of children: N/A  . Years of education: N/A   Occupational History  . Not on file.   Social History Main Topics  . Smoking status: Former Smoker    Quit date: 07/28/1959  . Smokeless tobacco: Never Used  . Alcohol use No  . Drug use: No  . Sexual activity: Not on file   Other Topics Concern  . Not on file   Social History Narrative  . No narrative on file    Past Surgical History:  Procedure Laterality Date  . JOINT REPLACEMENT    . left total knee replacement    . right total hip replacement      Allergies  Allergen Reactions  . Dust Mite Extract Shortness Of Breath  . Mold Extract [Trichophyton Mentagrophyte] Shortness Of Breath, Itching and Cough  . Latex Itching    And certain fabrics  . Tape Other (See Comments)    SKIN IS VERY THIN AND WILL TEAR AND BRUISE EASILY!! PLEASE USE COBAN WRAP-    Prior to Admission medications   Medication Sig Start Date End Date Taking? Authorizing Provider  dabigatran (PRADAXA) 75 MG CAPS capsule Take 75 mg by mouth every 12 (twelve) hours.   Yes Historical Provider, MD  fluocinonide cream (LIDEX) 0.05 % APPLY TO  LEGS TWICE A DAY AS DIRECTED 02/08/15  Yes Historical Provider, MD  furosemide (LASIX) 20 MG tablet Take 2 tablets (40 mg total) by mouth daily. 03/31/12  Yes Evans Lance, MD  metoprolol tartrate (LOPRESSOR) 25 MG tablet Take 25 mg by mouth 2 (two) times daily.   Yes Historical Provider, MD  Multiple Vitamin (MULITIVITAMIN WITH MINERALS) TABS Take 1 tablet by mouth at bedtime.    Yes Historical Provider, MD  Multiple Vitamins-Minerals (ICAPS MV PO) Take 1 capsule by mouth 2 (two) times daily.    Yes Historical Provider, MD  omeprazole (PRILOSEC) 40 MG capsule Take 40 mg by mouth daily.   Yes Historical Provider, MD  Probiotic Product (ALIGN PO) Take 1 capsule by mouth  every morning.   Yes Historical Provider, MD  spironolactone (ALDACTONE) 25 MG tablet Take 1 tablet (25 mg total) by mouth 3 (three) times a week. Monday Wednesday Friday 05/30/14  Yes Evans Lance, MD  timolol (TIMOPTIC) 0.5 % ophthalmic solution Place 1 drop into the left eye 2 (two) times daily. 05/05/14  Yes Historical Provider, MD  traMADol (ULTRAM) 50 MG tablet Take 1 tablet by mouth 3 (three) times daily as needed. Pain 04/12/14  Yes Historical Provider, MD  verapamil (VERELAN PM) 120 MG 24 hr capsule TAKE 1 CAPSULE TWICE A DAY 10/12/16  Yes Evans Lance, MD  vitamin C (ASCORBIC ACID) 500 MG tablet Take 500 mg by mouth 2 (two) times daily.   Yes Historical Provider, MD    Review of systems complete and found to be negative unless listed above    Physical Exam: Blood pressure 117/62, pulse 89, temperature (S) (!) 100.6 F (38.1 C), temperature source Rectal, resp. rate (!) 29, SpO2 (!) 89 %.   General: Elderly, non-ill appearing woman, lying flat in bed, no distress  HEENT: NCAT, EOMI SKIN: warm, dry. No rashes.  Neuro: No focal deficits  Musculoskeletal: No obvious deformities Psychiatric: Mood and affect normal  Neck: No JVD, full ROM Lungs:Diminished bilaterally at bases, no wheezes, rhonci, crackles  Cardiovascular: Irregular rhythm with regular rate. No murmurs, gallops or rubs.  Chest wall TTP Abdomen:Soft. Bowel sounds present. Non-tender.  Extremities: Chronic lower extremity edema with legs wrapped   Labs:   Lab Results  Component Value Date   WBC 11.8 (H) 11/04/2016   HGB 11.4 (L) 11/04/2016   HCT 36.7 11/04/2016   MCV 65.4 (L) 11/04/2016   PLT 180 11/04/2016     Recent Labs Lab 11/04/16 0952  NA 139  K 4.8  CL 104  CO2 23  BUN 59*  CREATININE 1.73*  CALCIUM 9.5  PROT 6.9  BILITOT 1.3*  ALKPHOS 65  ALT 12*  AST 28  GLUCOSE 109*     Radiology: Chest X-ray:  mild pulmonary vascular congestion and interstitial edema. Probable small bilateral  pleural effusions. Bibasilar atelectasis or pneumonia.  EKG: a-fib, old lateral infarct, prolonged QT  ASSESSMENT AND PLAN:  83 y.o. woman with PMHx significant for chronic atrial fibrillation, hypertension, chronic diastolic HF, renal insufficiency, microcytic anemia, chronic venous insufficiency presenting with chest pain.   # Chest Pain: Atypical chest pain with onset after coughing episode this morning that is worse with inspiration and tender to palpation in 83yo patient with no known CAD.  D-dimer positive, but given age adustment and Wells score for PE (1.5 pts only for HR > 100), she is low risk.  Initial I-stat troponin 0.03.  Initial EKG is without acute ischemic changes and  appears unchanged from previous. - repeat EKG in the AM - cycle troponin  # Atrial Fibrillation: She is on Pradaxa, BB, and verapamil.  Unclear what may have precipitated her run in to the 200s but possibility that etiology could be an underlying pneumonia vs HF exacerbation - Monitor on telemetry - Continue home meds  # Exacerbation of HFpEF: BNP is elevated to 499 with mild pulmonary vascular congestion.  Weight also up about 8 pounds since Nov 2017.  She is on Lasix 40mg  PO daily.  Received 60mg  IV x 1 in the ED - will reassess volume status in the AM - will obtain TTE to reassess her LV function - monitor I/O's, daily weights  # HTN: Her home meds are metoprolol, spironolactone, verapamil.  She is normotensive here.  # Possible Pneumonia: received Augmentin in the ED - antibiotics per primary service  # Renal Insufficiency: Creatinine is 1.7 with baseline range of 1.1-1.9 over the past few years but no recent comparison since 2014.  Electrolytes are okay. - monitor carefully with diuresis  # Anemia: Stable hemoglobin with no evidence of blood loss.  Signed: Jule Ser 11/04/2016, 3:28 PM PGY-2, [facility]  I have personally seen and examined this patient with Dr. Juleen China. I  agree with the assessment and plan as outlined above. She is admitted with atypical chest pain (worsened on inspiration and with cough), weight gain and cough with low grade fevers at home. She is found to have a pneumonia and has been started on antibiotics by the ED staff. She is also felt to be mildly volume overloaded. She reports a 8 lb weight gain over the last month. She has chronic LE edema. Her chest pain is described as a central sharp pain with cough and deep inspiration She also has pain with palpation of the chest wall. My exam shows an elderly female in NAD. She is alert and oriented x 3. AX:KPVVZ irreg. Pulm: faint basilar crackles. Abd: soft, NT. Ext: both legs are wrapped with ACE bandages. There appears to be mild LE edema.  Labs show mild anemia, creat 1.7.  I have personally reviewed the EKG today and this shows atrial fib with rate 90 bpm. Old lateral infarct Plan:  1. Atypical chest pain: I do not think this is cardiac related. Will cycle troponin.  2. Acute diastolic CHF: She has been given one dose of IV Lasix for mild volume overload. She can be reassessed in the am. She may not need additional IV Lasix. Will arrange echo to assess LVEF.  3. Atrial fib, persistent: She is rate controlled. She did have one episode of tachycardia while in the ED, possible SVT. Continue metoprolol, verapamil. She is on Pradaxa. 4. Pneumonia: Antibiotics per primary team.   We will be glad to follow along.   Lauree Chandler 11/04/2016 3:45 PM

## 2016-11-04 NOTE — ED Triage Notes (Signed)
Pt arrives from home via [facility]. Pt states she woke up at 0300 today and had a "long coughing spell". Pt states after that she developed substernal CP that worsens with inspiration and palpation.

## 2016-11-04 NOTE — Plan of Care (Signed)
PharmD, Janett Billow, called this NP to review meds and certain concerns for interactions. Pt has acute kidney injury and therefore, use of Verapamil and Pradaxa together could cause increase levels of Pradaxa. Pt takes a BB at home, so for now, we will d/c Verapamil and use the BB. She needs the Pradaxa due to chronic Afib. Janett Billow suggested that long term, a different solution for home CCB may be needed. Also, Cleocin is not necessarily indicated at this time for her cellulitis given the use of Rocephin for CAP. Will hold Cleocin overnight and leave message for attending to evaluate severity of cellulitis and see if Cleocin is indicated in addition to Rocephin while here in hospital. Also, will change Zofran to Compazine due to prolonged qtc. Monitor qtc on tele.  All this will be communicated to the oncoming attending at [place]. Cardiology is also involved in pt's care, so they should be able to weigh in on the Verapamil/Pradaxa issue.  KJKG, NP [facility]

## 2016-11-05 ENCOUNTER — Inpatient Hospital Stay (HOSPITAL_COMMUNITY): Payer: Medicare Other

## 2016-11-05 DIAGNOSIS — I5023 Acute on chronic systolic (congestive) heart failure: Secondary | ICD-10-CM

## 2016-11-05 LAB — RESPIRATORY PANEL BY PCR
Adenovirus: NOT DETECTED
BORDETELLA PERTUSSIS-RVPCR: NOT DETECTED
CHLAMYDOPHILA PNEUMONIAE-RVPPCR: NOT DETECTED
CORONAVIRUS 229E-RVPPCR: NOT DETECTED
Coronavirus HKU1: NOT DETECTED
Coronavirus NL63: NOT DETECTED
Coronavirus OC43: NOT DETECTED
INFLUENZA A-RVPPCR: NOT DETECTED
INFLUENZA B-RVPPCR: NOT DETECTED
METAPNEUMOVIRUS-RVPPCR: NOT DETECTED
MYCOPLASMA PNEUMONIAE-RVPPCR: NOT DETECTED
Parainfluenza Virus 1: NOT DETECTED
Parainfluenza Virus 2: NOT DETECTED
Parainfluenza Virus 3: NOT DETECTED
Parainfluenza Virus 4: NOT DETECTED
RESPIRATORY SYNCYTIAL VIRUS-RVPPCR: NOT DETECTED
Rhinovirus / Enterovirus: NOT DETECTED

## 2016-11-05 LAB — URINALYSIS, ROUTINE W REFLEX MICROSCOPIC
Bilirubin Urine: NEGATIVE
Glucose, UA: NEGATIVE mg/dL
Hgb urine dipstick: NEGATIVE
KETONES UR: NEGATIVE mg/dL
Nitrite: NEGATIVE
PROTEIN: NEGATIVE mg/dL
Specific Gravity, Urine: 1.011 (ref 1.005–1.030)
pH: 5 (ref 5.0–8.0)

## 2016-11-05 LAB — BASIC METABOLIC PANEL
Anion gap: 9 (ref 5–15)
BUN: 48 mg/dL — AB (ref 6–20)
CHLORIDE: 98 mmol/L — AB (ref 101–111)
CO2: 28 mmol/L (ref 22–32)
CREATININE: 1.7 mg/dL — AB (ref 0.44–1.00)
Calcium: 8.7 mg/dL — ABNORMAL LOW (ref 8.9–10.3)
GFR calc Af Amer: 28 mL/min — ABNORMAL LOW (ref >60)
GFR calc non Af Amer: 24 mL/min — ABNORMAL LOW (ref >60)
GLUCOSE: 127 mg/dL — AB (ref 65–99)
Potassium: 3.7 mmol/L (ref 3.5–5.1)
SODIUM: 135 mmol/L (ref 135–145)

## 2016-11-05 LAB — HIV ANTIBODY (ROUTINE TESTING W REFLEX): HIV SCREEN 4TH GENERATION: NONREACTIVE

## 2016-11-05 LAB — STREP PNEUMONIAE URINARY ANTIGEN: STREP PNEUMO URINARY ANTIGEN: NEGATIVE

## 2016-11-05 LAB — TROPONIN I: TROPONIN I: 0.1 ng/mL — AB (ref <0.03)

## 2016-11-05 MED ORDER — SPIRONOLACTONE 25 MG PO TABS
25.0000 mg | ORAL_TABLET | Freq: Every day | ORAL | Status: DC
  Administered 2016-11-05 – 2016-11-07 (×3): 25 mg via ORAL
  Filled 2016-11-05 (×3): qty 1

## 2016-11-05 MED ORDER — DILTIAZEM HCL ER COATED BEADS 120 MG PO CP24
120.0000 mg | ORAL_CAPSULE | Freq: Every day | ORAL | Status: DC
  Administered 2016-11-05 – 2016-11-07 (×3): 120 mg via ORAL
  Filled 2016-11-05 (×3): qty 1

## 2016-11-05 MED ORDER — FUROSEMIDE 10 MG/ML IJ SOLN
40.0000 mg | Freq: Two times a day (BID) | INTRAMUSCULAR | Status: AC
  Administered 2016-11-05 (×2): 40 mg via INTRAVENOUS
  Filled 2016-11-05 (×2): qty 4

## 2016-11-05 NOTE — Evaluation (Signed)
Occupational Therapy Evaluation Patient Details Name: Lori [name] MRN: [id number] DOB: 02-May-1919 Today's Date: 11/05/2016    History of Present Illness Presents with atypical chest pain with episodic rapid atrial flutter in patient his chronic atrial fibrillation. Also had low-grade fevers and chest x-ray inconclusive for heart failure, pneumonia or both. PMHx: HTN, Chronic acquired lymphedema. LTKR, [place]   Clinical Impression   This 83 yo female admitted with above presents to acute OT with deficits below (see OT problem list) thus affecting her PLOF of Mod I. She will benefit from acute OT with follow up [place] to get back to PLOF.     Follow Up Recommendations  Home health OT;Supervision - Intermittent ([facility]HAide)    Equipment Recommendations  Tub/shower seat       Precautions / Restrictions Precautions Precautions: Fall Restrictions Weight Bearing Restrictions: No      Mobility Bed Mobility Overal bed mobility: Modified Independent             General bed mobility comments: increased time and use of rail; pt sleeps in recliner at home  Transfers Overall transfer level: Needs assistance Equipment used: Rolling walker (2 wheeled) Transfers: Sit to/from Stand Sit to Stand: Min guard              Balance Overall balance assessment: Needs assistance Sitting-balance support: No upper extremity supported;Feet supported Sitting balance-Leahy Scale: Good     Standing balance support: Single extremity supported;During functional activity Standing balance-Leahy Scale: Poor Standing balance comment: Min A due to posterior lean during back peri care in standing                           ADL either performed or assessed with clinical judgement   ADL Overall ADL's : Needs assistance/impaired Eating/Feeding: Independent;Sitting   Grooming: Min guard;Wash/dry hands;Standing   Upper Body Bathing: Set up;Standing;Sitting   Lower Body Bathing: Set  up;Supervison/ safety;Sit to/from stand   Upper Body Dressing : Set up;Sitting   Lower Body Dressing: Set up;Supervision/safety;Sit to/from stand   Toilet Transfer: Min guard;Ambulation;RW;Comfort height toilet;Grab bars   Toileting- Clothing Manipulation and Hygiene: Minimal assistance;Sit to/from stand                            Pertinent Vitals/Pain Pain Assessment: No/denies pain     Hand Dominance Right   Extremity/Trunk Assessment Upper Extremity Assessment Upper Extremity Assessment: Overall WFL for tasks assessed   Lower Extremity Assessment Lower Extremity Assessment: Defer to PT evaluation       Communication Communication Communication: HOH   Cognition Arousal/Alertness: Awake/alert Behavior During Therapy: WFL for tasks assessed/performed Overall Cognitive Status: Within Functional Limits for tasks assessed                                                Home Living Family/patient expects to be discharged to:: Private residence Living Arrangements: Alone Available Help at Discharge: Family;Available PRN/intermittently Type of Home: Apartment Home Access: Level entry     Home Layout: One level     Bathroom Shower/Tub: Teacher, early years/pre: Standard Bathroom Accessibility: Yes   Home Equipment: Hand held shower head;Grab bars - tub/shower;Grab bars - toilet;Walker - 4 wheels          Prior Functioning/Environment Level of  Independence: Independent with assistive device(s)        Comments: was doing own meal prep and using microwave, laundry, sleeps in recliner        OT Problem List: Decreased strength;Impaired balance (sitting and/or standing)      OT Treatment/Interventions: Self-care/ADL training;Therapeutic activities;Patient/family education;DME and/or AE instruction;Balance training    OT Goals(Current goals can be found in the care plan section) Acute Rehab OT Goals Patient Stated Goal:  to go home today OT Goal Formulation: With patient Time For Goal Achievement: 11/19/16 Potential to Achieve Goals: Good  OT Frequency: Min 2X/week           Co-evaluation PT/OT/SLP Co-Evaluation/Treatment: Yes Reason for Co-Treatment: To address functional/ADL transfers   OT goals addressed during session: ADL's and self-care;Strengthening/ROM      AM-PAC PT "6 Clicks" Daily Activity     Outcome Measure Help from another person eating meals?: None Help from another person taking care of personal grooming?: None Help from another person toileting, which includes using toliet, bedpan, or urinal?: A Little Help from another person bathing (including washing, rinsing, drying)?: A Little Help from another person to put on and taking off regular upper body clothing?: None Help from another person to put on and taking off regular lower body clothing?: A Little 6 Click Score: 21   End of Session Equipment Utilized During Treatment: Gait belt;Rolling walker Nurse Communication: Mobility status (left on 1 liter of O2 due to 90-91% post activity)  Activity Tolerance: Patient tolerated treatment well Patient left: in chair;with call bell/phone within reach;with chair alarm set  OT Visit Diagnosis: Unsteadiness on feet (R26.81)                Time: 1062-6948 OT Time Calculation (min): 27 min Charges:  OT General Charges $OT Visit: 1 Procedure OT Evaluation $OT Eval Moderate Complexity: 1 Procedure [place], OTR/L [phone] 11/05/2016

## 2016-11-05 NOTE — Progress Notes (Signed)
PROGRESS NOTE                                                                                                                                                                                                             Patient Demographics:    Lori [name], is a 83 y.o. female, DOB - [birth year], FKC:[id number]  Admit date - 11/04/2016   Admitting Physician Waldemar Dickens, MD  Outpatient Primary MD for the patient is Donnajean Lopes, MD  LOS - 1  Chief Complaint  Patient presents with  . Chest Pain       Brief Narrative  Lori [name] is a 83 y.o. female with medical history significant for chronic lower extremity edema with recurrent cellulitis currently utilizing UNNA boots, known urinary hypertension with tricuspid regurgitation as well as moderate mitral regurgitation based on tachycardia in 2011, current treatment for cellulitis of both legs, chronic atrial fibrillation on Pradaxa, dyslipidemia, hypertension and iron deficiency anemia. Patient reports that she was in her usual state of health when she woke up around 3 AM with coughing episode, Further workup suggested CHF with possible bronchitis. She was also found to be in A. fib with RVR.   Subjective:    Lori [name] today has, No headache, No chest pain, No abdominal pain - No Nausea, No new weakness tingling or numbness, No Cough - Improved SOB.    Assessment  & Plan :     1.Atypical chest pain. Currently resolved. Troponin trend stable and in non-ACS pattern, currently on beta blocker, Pradaxa. Cardiology on board will continue to monitor. Echocardiogram ordered this admission is pending.  2. Acute on chronic diastolic CHF. EF preserved on previous echo, present echocardiogram pending. Continue IV Lasix and beta blocker, cartilage on board, monitor electrolytes intake output and weight.  3. History of cellulitis. Currently appears stable could have been  redness due to edema but will monitor.  4. Pneumonia versus bronchitis upon admission. Continue Rocephin and azithromycin and monitor clinically.  5. Chronic A. fib was in RVR upon admission. [place] vasc 2 score 5. She was on combination of calcium channel blocker and beta blocker which will be continued, continue Pradaxa. Cardiology on board. Defer any changes to her medications to cardiology. Now in rate control.  6. Chronic iron deficiency anemia. Stable outpatient age-appropriate workup by PCP.  7.  ARF. Likely due to CHF diuresis and monitor.  8. Dyslipidemia. Diet controlled monitor.  9. Hypertension. Continue combination of beta blocker and calcium channel blocker.      Diet : Diet Heart Room service appropriate? Yes; Fluid consistency: Thin    Family Communication  : None  Code Status :  DNR  Disposition Plan  :  TBD  Consults  :  Cards  Procedures  :    TTE  DVT Prophylaxis  :  Pradaxa  Lab Results  Component Value Date   PLT 180 11/04/2016    Inpatient Medications  Scheduled Meds: . dabigatran  75 mg Oral Q12H  . diltiazem  120 mg Oral Daily  . furosemide  40 mg Intravenous Q12H  . latanoprost  1 drop Both Eyes QHS  . loratadine  10 mg Oral QHS  . metoprolol tartrate  25 mg Oral BID  . pantoprazole  40 mg Oral QHS  . spironolactone  25 mg Oral Daily  . timolol  1 drop Left Eye BID   Continuous Infusions: . azithromycin Stopped (11/04/16 1823)  . cefTRIAXone (ROCEPHIN)  IV Stopped (11/04/16 1751)   PRN Meds:.acetaminophen, albuterol, prochlorperazine, traMADol  Antibiotics  :    Anti-infectives    Start     Dose/Rate Route Frequency Ordered Stop   11/04/16 2200  clindamycin (CLEOCIN) capsule 300 mg  Status:  Discontinued     300 mg Oral 3 times daily 11/04/16 1825 11/04/16 1940   11/04/16 1600  cefTRIAXone (ROCEPHIN) 1 g in dextrose 5 % 50 mL IVPB     1 g 100 mL/hr over 30 Minutes Intravenous Every 24 hours 11/04/16 1540 11/11/16 1559   11/04/16  1600  azithromycin (ZITHROMAX) 500 mg in dextrose 5 % 250 mL IVPB     500 mg 250 mL/hr over 60 Minutes Intravenous Every 24 hours 11/04/16 1540 11/11/16 1559   11/04/16 1200  amoxicillin-clavulanate (AUGMENTIN) 875-125 MG per tablet 1 tablet     1 tablet Oral  Once 11/04/16 1150 11/04/16 1230         Objective:   Vitals:   11/04/16 1941 11/05/16 0211 11/05/16 0529 11/05/16 0946  BP: 120/72 (!) 94/40 (!) 100/53 (!) 110/51  Pulse: 98 85 67 62  Resp: 18 18 18 18   Temp: 98.5 F (36.9 C) 98 F (36.7 C) 98.3 F (36.8 C) 98 F (36.7 C)  TempSrc: Oral Oral Oral Oral  SpO2: 96% 94% 98% 97%  Weight:   85.2 kg (187 lb 14.4 oz)   Height:        Wt Readings from Last 3 Encounters:  11/05/16 85.2 kg (187 lb 14.4 oz)  09/04/16 85.5 kg (188 lb 6.4 oz)  05/26/16 82.1 kg (181 lb)     Intake/Output Summary (Last 24 hours) at 11/05/16 1111 Last data filed at 11/05/16 0947  Gross per 24 hour  Intake              240 ml  Output                1 ml  Net              239 ml     Physical Exam  Awake Alert, Oriented X 3, No new F.N deficits, Normal affect [place].AT,PERRAL Supple Neck,No JVD, No cervical lymphadenopathy appriciated.  Symmetrical Chest wall movement, Good air movement bilaterally, few rales RRR,No Gallops,Rubs or new Murmurs, No Parasternal Heave +ve B.Sounds, Abd Soft, No tenderness, No organomegaly appriciated,  No rebound - guarding or rigidity. No Cyanosis, Clubbing or edema, No new Rash or bruise       Data Review:    CBC  Recent Labs Lab 11/04/16 0952  WBC 11.8*  HGB 11.4*  HCT 36.7  PLT 180  MCV 65.4*  MCH 20.3*  MCHC 31.1  RDW 15.7*  LYMPHSABS 1.2  MONOABS 1.2*  EOSABS 0.1  BASOSABS 0.1    Chemistries   Recent Labs Lab 11/04/16 0952 11/05/16 0318  NA 139 135  K 4.8 3.7  CL 104 98*  CO2 23 28  GLUCOSE 109* 127*  BUN 59* 48*  CREATININE 1.73* 1.70*  CALCIUM 9.5 8.7*  AST 28  --   ALT 12*  --   ALKPHOS 65  --   BILITOT 1.3*  --     ------------------------------------------------------------------------------------------------------------------ No results for input(s): CHOL, HDL, LDLCALC, TRIG, CHOLHDL, LDLDIRECT in the last 72 hours.  No results found for: HGBA1C ------------------------------------------------------------------------------------------------------------------ No results for input(s): TSH, T4TOTAL, T3FREE, THYROIDAB in the last 72 hours.  Invalid input(s): FREET3 ------------------------------------------------------------------------------------------------------------------ No results for input(s): VITAMINB12, FOLATE, FERRITIN, TIBC, IRON, RETICCTPCT in the last 72 hours.  Coagulation profile No results for input(s): INR, PROTIME in the last 168 hours.   Recent Labs  11/04/16 0952  DDIMER 0.83*    Cardiac Enzymes  Recent Labs Lab 11/04/16 1637 11/04/16 2057 11/05/16 0318  TROPONINI 0.05* 0.07* 0.10*   ------------------------------------------------------------------------------------------------------------------    Component Value Date/Time   BNP 499.2 (H) 11/04/2016 0952    Micro Results Recent Results (from the past 240 hour(s))  Culture, blood (routine x 2) Call MD if unable to obtain prior to antibiotics being given     Status: None (Preliminary result)   Collection Time: 11/04/16  4:25 PM  Result Value Ref Range Status   Specimen Description BLOOD RIGHT ARM  Final   Special Requests   Final    BOTTLES DRAWN AEROBIC AND ANAEROBIC Blood Culture adequate volume   Culture NO GROWTH < 24 HOURS  Final   Report Status PENDING  Incomplete  Culture, blood (routine x 2) Call MD if unable to obtain prior to antibiotics being given     Status: None (Preliminary result)   Collection Time: 11/04/16  4:30 PM  Result Value Ref Range Status   Specimen Description BLOOD RIGHT ANTECUBITAL  Final   Special Requests   Final    BOTTLES DRAWN AEROBIC AND ANAEROBIC Blood Culture adequate  volume   Culture NO GROWTH < 24 HOURS  Final   Report Status PENDING  Incomplete    Radiology Reports Dg Chest 2 View  Result Date: 11/05/2016 CLINICAL DATA:  Chest pain and shortness of breath.  Follow-up. EXAM: CHEST  2 VIEW COMPARISON:  Nov 04, 2016 FINDINGS: Cardiomegaly. Bilateral pleural effusions and pulmonary edema remain. No other acute interval changes. IMPRESSION: Cardiomegaly with bilateral pleural effusions and mild edema. Electronically Signed   By: Dorise Bullion III M.D   On: 11/05/2016 08:26   Dg Chest 2 View  Result Date: 11/04/2016 CLINICAL DATA:  Awakened this morning with strong productive cough with subsequent development of chest pain and shortness of breath. History of asthma, allergies, atrial fibrillation, former smoker. EXAM: CHEST  2 VIEW COMPARISON:  Chest x-ray of July 09, 2012 FINDINGS: The lungs are well-expanded. The interstitial markings are coarse. There it is increased density at both lung bases. The cardiac silhouette is enlarged. The pulmonary vascularity is mildly engorged. There is calcification in the wall of the  aortic arch. There may be small pleural effusions bilaterally. The bony thorax exhibits no acute abnormality. IMPRESSION: CHF with mild pulmonary vascular congestion and interstitial edema. Probable small bilateral pleural effusions. Bibasilar atelectasis or pneumonia. Followup PA and lateral chest X-ray is recommended in 3-4 weeks following trial of antibiotic therapy to ensure resolution. Electronically Signed   By: David  Martinique M.D.   On: 11/04/2016 10:13    Time Spent in minutes  30   Lala Lund M.D on 11/05/2016 at 11:11 AM  Between 7am to 7pm - Pager - [phone] ( page via [email or web address], text pages only, please mention full 10 digit call back number). After 7pm go to [email or web address] - password [email or web address]

## 2016-11-05 NOTE — Progress Notes (Signed)
Progress Note  Patient Name: Lori [name] Date of Encounter: 11/05/2016  Primary Cardiologist: Lovena Le  Subjective   Pt seen and examined this AM.  Reports feeling better since admission.   No dyspnea.  Still with some chest discomfort with deep inspiration.   She reports feeling tired.   Ambulated to bathroom with RN without chest discomfort. Cough is improved.  Inpatient Medications    Scheduled Meds: . dabigatran  75 mg Oral Q12H  . latanoprost  1 drop Both Eyes QHS  . loratadine  10 mg Oral QHS  . metoprolol tartrate  25 mg Oral BID  . pantoprazole  40 mg Oral QHS  . sodium chloride flush  3 mL Intravenous Q12H  . timolol  1 drop Left Eye BID   Continuous Infusions: . sodium chloride    . azithromycin Stopped (11/04/16 1823)  . cefTRIAXone (ROCEPHIN)  IV Stopped (11/04/16 1751)   PRN Meds: sodium chloride, acetaminophen, albuterol, prochlorperazine, sodium chloride flush, traMADol   Vital Signs    Vitals:   11/04/16 1941 11/05/16 0211 11/05/16 0529 11/05/16 0946  BP: 120/72 (!) 94/40 (!) 100/53 (!) 110/51  Pulse: 98 85 67 62  Resp: 18 18 18 18   Temp: 98.5 F (36.9 C) 98 F (36.7 C) 98.3 F (36.8 C) 98 F (36.7 C)  TempSrc: Oral Oral Oral Oral  SpO2: 96% 94% 98% 97%  Weight:   187 lb 14.4 oz (85.2 kg)   Height:        Intake/Output Summary (Last 24 hours) at 11/05/16 1005 Last data filed at 11/05/16 0947  Gross per 24 hour  Intake              240 ml  Output                1 ml  Net              239 ml   Filed Weights   11/04/16 1755 11/05/16 0529  Weight: 186 lb 3.2 oz (84.5 kg) 187 lb 14.4 oz (85.2 kg)    Telemetry    No further episodes of RVR  ECG    5/2: atrial fibrillation, old lateral infarct   Physical Exam   GEN: No acute distress, sitting up in chair.   Neck: No JVD Cardiac: irregularly irregular, no murmurs, rubs, or gallops.  No chest wall TTP Respiratory: on 1 L via [place], normal effort, fine crackles at bases. GI: Soft,  nontender, non-distended  MS: chronic LE edema w/ venous stasis changes Neuro:  Nonfocal  Psych: Normal affect   Labs    Chemistry  Recent Labs Lab 11/04/16 0952 11/05/16 0318  NA 139 135  K 4.8 3.7  CL 104 98*  CO2 23 28  GLUCOSE 109* 127*  BUN 59* 48*  CREATININE 1.73* 1.70*  CALCIUM 9.5 8.7*  PROT 6.9  --   ALBUMIN 3.8  --   AST 28  --   ALT 12*  --   ALKPHOS 65  --   BILITOT 1.3*  --   GFRNONAA 24* 24*  GFRAA 28* 28*  ANIONGAP 12 9     Hematology  Recent Labs Lab 11/04/16 0952  WBC 11.8*  RBC 5.61*  HGB 11.4*  HCT 36.7  MCV 65.4*  MCH 20.3*  MCHC 31.1  RDW 15.7*  PLT 180    Cardiac Enzymes  Recent Labs Lab 11/04/16 1637 11/04/16 2057 11/05/16 0318  TROPONINI 0.05* 0.07* 0.10*  Recent Labs Lab 11/04/16 0957 11/04/16 1514  TROPIPOC 0.03 0.03     BNP  Recent Labs Lab 11/04/16 0952  BNP 499.2*     DDimer   Recent Labs Lab 11/04/16 0952  DDIMER 0.83*     Radiology    Dg Chest 2 View  Result Date: 11/05/2016 CLINICAL DATA:  Chest pain and shortness of breath.  Follow-up. EXAM: CHEST  2 VIEW COMPARISON:  Nov 04, 2016 FINDINGS: Cardiomegaly. Bilateral pleural effusions and pulmonary edema remain. No other acute interval changes. IMPRESSION: Cardiomegaly with bilateral pleural effusions and mild edema. Electronically Signed   By: Dorise Bullion III M.D   On: 11/05/2016 08:26   Dg Chest 2 View  Result Date: 11/04/2016 CLINICAL DATA:  Awakened this morning with strong productive cough with subsequent development of chest pain and shortness of breath. History of asthma, allergies, atrial fibrillation, former smoker. EXAM: CHEST  2 VIEW COMPARISON:  Chest x-ray of July 09, 2012 FINDINGS: The lungs are well-expanded. The interstitial markings are coarse. There it is increased density at both lung bases. The cardiac silhouette is enlarged. The pulmonary vascularity is mildly engorged. There is calcification in the wall of the aortic  arch. There may be small pleural effusions bilaterally. The bony thorax exhibits no acute abnormality. IMPRESSION: CHF with mild pulmonary vascular congestion and interstitial edema. Probable small bilateral pleural effusions. Bibasilar atelectasis or pneumonia. Followup PA and lateral chest X-ray is recommended in 3-4 weeks following trial of antibiotic therapy to ensure resolution. Electronically Signed   By: David  Martinique M.D.   On: 11/04/2016 10:13    Cardiac Studies   Echo: pending  Patient Profile     83 y.o. female admitted with atypical CP, weight gain, cough, and low grade fevers who had an episode of tachycardia in the ED.  Assessment & Plan    # Chest Pain with mild troponin elevation: her CP seems atypical and worse with inspiration.  It is not worse with exertion and not present at rest.  Her troponin trend is 0.05 >> 0.07 >> 0.10.  Suspect troponin elevation may be related to demand from tachycardia vs underlying infection with some renal insufficiency but unable to definitively say not related to CAD.  No new EKG today.  Repeat CXR shows cardiomegaly with bilateral pleural effusion and mild edema.  Will continue diuresis as below  # Atrial Fibrillation: She is on Pradaxa, BB, and verapamil.  Verapamil is being held at this time.  No further episodes of tachycardia - Telemetry  # Exacerbation of HFpEF: Having good UOP per RN and pt.  I/O's not recorded due to incontinence.  Weight is unchanged at 187# today.  Given 1 time dose of Lasix 60mg  IV yesterday.  CXR findings as above. - Echo pending - Lasix 40mg  IV BID - I/O's, daily weights  # HTN: Her home meds are metoprolol, spironolactone, verapamil.  She is normotensive here on metoprolol alone.  # Renal Insufficiency: Creatinine is stable this morning at 1.7 with baseline range of 1.1-1.9 over the past few years but no recent comparison since 2014.  Electrolytes are okay today. - monitor carefully with diuresis  #  Possible Pneumonia: received Augmentin in the ED.  Currently on Ceftriaxone and Azithromycin. - antibiotics per primary service  Signed, Jule Ser, DO  11/05/2016, 10:05 AM    I have personally seen and examined this patient with Dr. Juleen China. I agree with the assessment and plan as outlined above. She seems to be better  today. She is still volume overloaded. Will continue IV Lasix today. She is in rate controlled atrial fib this am. Verapamil on hold with hypotension yesterday. May need to increase beta blocker tomorrow if rate control is an issue. Continue Pradaxa (interaction with verapamil and Pradaxa per pharmacy so may be best to stop verapamil). Echo today to assess LVEF. Her troponin is slightly elevated bu there chest pain is atypical. I would like to avoid invasive procedures if possible.   Lauree Chandler 11/05/2016 11:11 AM

## 2016-11-05 NOTE — Evaluation (Signed)
Physical Therapy Evaluation Patient Details Name: Lori [name] MRN: [id number] DOB: 1918-11-17 Today's Date: 11/05/2016   History of Present Illness  Presents with atypical chest pain with episodic rapid atrial flutter in patient his chronic atrial fibrillation. Also had low-grade fevers and chest x-ray inconclusive for heart failure, pneumonia or both. PMHx: HTN, Chronic acquired lymphedema. LTKR, RTHR  Clinical Impression  Pt presents with the above diagnosis and below deficits for therapy evaluation. Prior to admission, pt lived alone in a single level home receiving occasional assistance from her family to assist with shopping and transportation. Pt is able to perform mobility with Min guard to Min A this session with Min A required for activities requiring increased dynamic balance skills. Pt will benefit from continued acute PT services in order to address the below deficits prior to discharge to venue recommended below.     Follow Up Recommendations Home health PT    Equipment Recommendations  None recommended by PT    Recommendations for Other Services       Precautions / Restrictions Precautions Precautions: Fall Restrictions Weight Bearing Restrictions: No      Mobility  Bed Mobility Overal bed mobility: Modified Independent             General bed mobility comments: increased time and use of rail; pt sleeps in recliner at home  Transfers Overall transfer level: Needs assistance Equipment used: Rolling walker (2 wheeled) Transfers: Sit to/from Stand Sit to Stand: Min guard         General transfer comment: Min gaurd for safety from EOB and from commode  Ambulation/Gait Ambulation/Gait assistance: Min guard Ambulation Distance (Feet): 20 Feet Assistive device: Rolling walker (2 wheeled) Gait Pattern/deviations: Step-through pattern;Decreased step length - right;Decreased step length - left Gait velocity: decreased Gait velocity interpretation: Below  normal speed for age/gender General Gait Details: minimal trunk flexion, decreased step length bilaterally.   Stairs            Wheelchair Mobility    Modified Rankin (Stroke Patients Only)       Balance Overall balance assessment: Needs assistance Sitting-balance support: No upper extremity supported;Feet supported Sitting balance-Leahy Scale: Good     Standing balance support: Single extremity supported;During functional activity Standing balance-Leahy Scale: Poor Standing balance comment: Min A due to posterior lean during back peri care in standing                             Pertinent Vitals/Pain Pain Assessment: No/denies pain    Home Living Family/patient expects to be discharged to:: Private residence Living Arrangements: Alone Available Help at Discharge: Family;Available PRN/intermittently Type of Home: Apartment Home Access: Level entry     Home Layout: One level Home Equipment: Hand held shower head;Grab bars - tub/shower;Grab bars - toilet;Walker - 4 wheels      Prior Function Level of Independence: Independent with assistive device(s)         Comments: was doing own meal prep and using microwave, laundry, sleeps in recliner     Hand Dominance   Dominant Hand: Right    Extremity/Trunk Assessment   Upper Extremity Assessment Upper Extremity Assessment: Defer to OT evaluation    Lower Extremity Assessment Lower Extremity Assessment: Generalized weakness       Communication   Communication: HOH  Cognition Arousal/Alertness: Awake/alert Behavior During Therapy: WFL for tasks assessed/performed Overall Cognitive Status: Within Functional Limits for tasks assessed  General Comments      Exercises     Assessment/Plan    PT Assessment Patient needs continued PT services  PT Problem List Decreased strength;Decreased activity tolerance;Decreased balance;Decreased  mobility       PT Treatment Interventions DME instruction;Gait training;Functional mobility training;Therapeutic activities;Therapeutic exercise;Balance training    PT Goals (Current goals can be found in the Care Plan section)  Acute Rehab PT Goals Patient Stated Goal: to go home today PT Goal Formulation: With patient Time For Goal Achievement: 11/12/16 Potential to Achieve Goals: Good    Frequency Min 3X/week   Barriers to discharge        Co-evaluation PT/OT/SLP Co-Evaluation/Treatment: Yes Reason for Co-Treatment: For patient/therapist safety;To address functional/ADL transfers PT goals addressed during session: Mobility/safety with mobility         AM-PAC PT "6 Clicks" Daily Activity  Outcome Measure Difficulty turning over in bed (including adjusting bedclothes, sheets and blankets)?: Total Difficulty moving from lying on back to sitting on the side of the bed? : Total Difficulty sitting down on and standing up from a chair with arms (e.g., wheelchair, bedside commode, etc,.)?: A Little Help needed moving to and from a bed to chair (including a wheelchair)?: A Little Help needed walking in hospital room?: A Little Help needed climbing 3-5 steps with a railing? : A Little 6 Click Score: 14    End of Session Equipment Utilized During Treatment: Gait belt Activity Tolerance: Patient tolerated treatment well Patient left: in chair;with call bell/phone within reach;with family/visitor present;with chair alarm set Nurse Communication: Mobility status PT Visit Diagnosis: Muscle weakness (generalized) (M62.81);Difficulty in walking, not elsewhere classified (R26.2)    Time: 6378-5885 PT Time Calculation (min) (ACUTE ONLY): 29 min   Charges:   PT Evaluation $PT Eval Low Complexity: 1 Procedure     PT G Codes:       Scheryl Marten PT, DPT  [phone]   Shanon Rosser 11/05/2016, 2:00 PM

## 2016-11-05 NOTE — Progress Notes (Signed)
ANTICOAGULATION CONSULT NOTE - Initial Consult  Pharmacy Consult for Pradaxa Indication: atrial fibrillation  Allergies  Allergen Reactions  . Dust Mite Extract Shortness Of Breath and Itching  . Mold Extract [Trichophyton Mentagrophyte] Shortness Of Breath, Itching and Cough    VERY ALLERGIC/EXPOSURE RESULTS IN TREMENDOUS COUGHING (just one symptom)  . Latex Itching    And certain fabrics  . Tape Other (See Comments)    SKIN IS VERY THIN AND WILL TEAR AND BRUISE EASILY!! PLEASE USE COBAN WRAP-    Patient Measurements: Height: 5\' 4"  (162.6 cm) Weight: 187 lb 14.4 oz (85.2 kg) IBW/kg (Calculated) : 54.7  Vital Signs: Temp: 98.3 F (36.8 C) (05/03 0529) Temp Source: Oral (05/03 0529) BP: 100/53 (05/03 0529) Pulse Rate: 67 (05/03 0529)  Labs:  Recent Labs  11/04/16 0952 11/04/16 1637 11/04/16 2057 11/05/16 0318  HGB 11.4*  --   --   --   HCT 36.7  --   --   --   PLT 180  --   --   --   CREATININE 1.73*  --   --  1.70*  TROPONINI  --  0.05* 0.07* 0.10*    Estimated Creatinine Clearance: 20.9 mL/min (A) (by C-G formula based on SCr of 1.7 mg/dL (H)).   Medical History: Past Medical History:  Diagnosis Date  . Anemia   . Asthma   . Cellulitis   . Chronic acquired lymphedema   . Chronic venous insufficiency   . Dysrhythmia   . Hyperlipidemia   . Hypertension     Medications:  Prescriptions Prior to Admission  Medication Sig Dispense Refill Last Dose  . albuterol (PROVENTIL HFA) 108 (90 Base) MCG/ACT inhaler Inhale 2 puffs into the lungs every 6 (six) hours as needed for wheezing or shortness of breath.   PRN at PRN  . clindamycin (CLEOCIN) 300 MG capsule Take 300 mg by mouth 3 (three) times daily.   11/03/2016 at pm  . dabigatran (PRADAXA) 75 MG CAPS capsule Take 75 mg by mouth every 12 (twelve) hours.   11/03/2016 at 2000  . furosemide (LASIX) 20 MG tablet Take 2 tablets (40 mg total) by mouth daily. 60 tablet 12 11/03/2016 at am  . loratadine (CLARITIN) 10 MG  tablet Take 10 mg by mouth at bedtime.   11/03/2016 at pm  . metoprolol tartrate (LOPRESSOR) 25 MG tablet Take 25 mg by mouth 2 (two) times daily.   11/03/2016 at 2000  . Multiple Vitamins-Minerals (ICAPS MV PO) Take 1 capsule by mouth daily.    11/03/2016 at am  . omeprazole (PRILOSEC) 40 MG capsule Take 40 mg by mouth 2 (two) times daily.    11/03/2016 at pm  . Probiotic Product (ALIGN PO) Take 1 capsule by mouth 2 (two) times daily.    11/03/2016 at pm  . spironolactone (ALDACTONE) 25 MG tablet Take 1 tablet (25 mg total) by mouth 3 (three) times a week. Monday Wednesday Friday (Patient taking differently: Take 50 mg by mouth every Monday, Wednesday, and Friday. ) 36 tablet 3 11/02/2016 at am  . timolol (TIMOPTIC) 0.5 % ophthalmic solution Place 1 drop into the left eye 2 (two) times daily.  12 11/03/2016 at pm  . traMADol (ULTRAM) 50 MG tablet Take 1 tablet by mouth See admin instructions. TWO TO THREE TIMES A DAY  3 11/03/2016 at pm  . Travoprost, BAK Free, (TRAVATAN Z) 0.004 % SOLN ophthalmic solution Place 1 drop into both eyes at bedtime.    11/03/2016 at  pm  . verapamil (VERELAN PM) 120 MG 24 hr capsule TAKE 1 CAPSULE TWICE A DAY 180 capsule 3 11/03/2016 at pm  . vitamin C (ASCORBIC ACID) 500 MG tablet Take 500 mg by mouth daily.    11/03/2016 at am  . DEBROX 6.5 % otic solution Place 5 drops into both ears See admin instructions. PLACE 5 DROPS INTO EACH EAR TWICE A DAY FOR FIVE DAYS TO PREP FOR IRRIGATION NEXT WEEK  0 Not yet at Not yet  . fluocinonide cream (LIDEX) 0.05 % APPLY TO LEGS TWICE A DAY AS DIRECTED  3 ON HOLD at ON HOLD  . Multiple Vitamin (MULITIVITAMIN WITH MINERALS) TABS Take 1 tablet by mouth at bedtime.    ON HOLD at ON HOLD    Assessment: 83 y.o. F presents with CP. Noted that pt on both dabigatran 75mg  po BID and verapamil 120mg  BID PTA. Pt with SCr up to 1.7, est CrCl 20 ml/min.  **Pharmacologic effects and plasma concentrations of Dabigatran may be increased by P-glycoprotein Inhibitors  (Like Verapamil ER). Increased risk of bleeding possible especially in pt with renal dysfunction.**  Noted that currently verapamil is being held, if decision to resume verapamil - may need to consider alternate form of anticoagulation long-term.  Goal of Therapy:  Prevention of CVA Monitor platelets by anticoagulation protocol: Yes   Plan:  Continue dabigatran 75mg  po BID If verapamil is to be restarted, will need to consider changing dabigatran to coumadin  Sherlon Handing, PharmD, BCPS Clinical pharmacist, pager [phone] 11/05/2016,6:25 AM

## 2016-11-06 ENCOUNTER — Inpatient Hospital Stay (HOSPITAL_COMMUNITY): Payer: Medicare Other

## 2016-11-06 DIAGNOSIS — I509 Heart failure, unspecified: Secondary | ICD-10-CM

## 2016-11-06 DIAGNOSIS — L8990 Pressure ulcer of unspecified site, unspecified stage: Secondary | ICD-10-CM | POA: Insufficient documentation

## 2016-11-06 LAB — URINE CULTURE: Culture: <10000 — AB

## 2016-11-06 LAB — CBC
HCT: 34.2 % — ABNORMAL LOW (ref 36.0–46.0)
Hemoglobin: 10.6 g/dL — ABNORMAL LOW (ref 12.0–15.0)
MCH: 20 pg — AB (ref 26.0–34.0)
MCHC: 31 g/dL (ref 30.0–36.0)
MCV: 64.7 fL — AB (ref 78.0–100.0)
PLATELETS: 161 10*3/uL (ref 150–400)
RBC: 5.29 MIL/uL — AB (ref 3.87–5.11)
RDW: 15.5 % (ref 11.5–15.5)
WBC: 9.3 10*3/uL (ref 4.0–10.5)

## 2016-11-06 LAB — BASIC METABOLIC PANEL
Anion gap: 14 (ref 5–15)
BUN: 39 mg/dL — AB (ref 6–20)
CALCIUM: 8.7 mg/dL — AB (ref 8.9–10.3)
CHLORIDE: 98 mmol/L — AB (ref 101–111)
CO2: 25 mmol/L (ref 22–32)
CREATININE: 1.61 mg/dL — AB (ref 0.44–1.00)
GFR calc non Af Amer: 26 mL/min — ABNORMAL LOW (ref >60)
GFR, EST AFRICAN AMERICAN: 30 mL/min — AB (ref >60)
Glucose, Bld: 102 mg/dL — ABNORMAL HIGH (ref 65–99)
Potassium: 3.6 mmol/L (ref 3.5–5.1)
SODIUM: 137 mmol/L (ref 135–145)

## 2016-11-06 LAB — ECHOCARDIOGRAM COMPLETE
HEIGHTINCHES: 64 in
Weight: 2856 oz

## 2016-11-06 MED ORDER — FUROSEMIDE 40 MG PO TABS
40.0000 mg | ORAL_TABLET | Freq: Every day | ORAL | Status: DC
  Administered 2016-11-06 – 2016-11-07 (×2): 40 mg via ORAL
  Filled 2016-11-06 (×2): qty 1

## 2016-11-06 NOTE — Progress Notes (Signed)
PT Cancellation Note  Patient Details Name: Lori [name] MRN: [id number] DOB: 1919-04-15   Cancelled Treatment:    Reason Eval/Treat Not Completed: Patient at procedure or test/unavailable. Pt not in her room. Will check back as time allows.    Scheryl Marten PT, DPT  [phone]  11/06/2016, 10:58 AM

## 2016-11-06 NOTE — Progress Notes (Signed)
Physical Therapy Treatment Patient Details Name: Lori [name] MRN: [id number] DOB: 08-Oct-1918 Today's Date: 11/06/2016    History of Present Illness Presents with atypical chest pain with episodic rapid atrial flutter in patient his chronic atrial fibrillation. Also had low-grade fevers and chest x-ray inconclusive for heart failure, pneumonia or both. PMHx: HTN, Chronic acquired lymphedema. LTKR, RTHR    PT Comments    Pt presents with improved tolerance for mobility this session. Pt is able to increase gait distance and stand to perform hand hygiene and brush her teeth this session. Pt now requesting RW and shower seat for home. Pt will benefit from HHPT at discharge.     Follow Up Recommendations  Home health PT     Equipment Recommendations  Other (comment);Rolling walker with 5" wheels (shower chair)    Recommendations for Other Services       Precautions / Restrictions Precautions Precautions: Fall Restrictions Weight Bearing Restrictions: No    Mobility  Bed Mobility Overal bed mobility: Modified Independent             General bed mobility comments: sitting in recliner when PT arrives  Transfers Overall transfer level: Needs assistance Equipment used: Rolling walker (2 wheeled) Transfers: Sit to/from Stand Sit to Stand: Supervision         General transfer comment: Supervision from recliner  Ambulation/Gait Ambulation/Gait assistance: Supervision Ambulation Distance (Feet): 60 Feet Assistive device: Rolling walker (2 wheeled) Gait Pattern/deviations: Step-through pattern;Decreased step length - right;Decreased step length - left Gait velocity: decreased Gait velocity interpretation: Below normal speed for age/gender General Gait Details: minimal trunk flexion, decreased step length bilaterally.    Stairs            Wheelchair Mobility    Modified Rankin (Stroke Patients Only)       Balance Overall balance assessment: Needs  assistance Sitting-balance support: No upper extremity supported;Feet supported Sitting balance-Leahy Scale: Good     Standing balance support: Single extremity supported;During functional activity Standing balance-Leahy Scale: Poor Standing balance comment: Able to stand at sink and brush teeth, leaning on sink                            Cognition Arousal/Alertness: Awake/alert Behavior During Therapy: WFL for tasks assessed/performed Overall Cognitive Status: Within Functional Limits for tasks assessed                                        Exercises      General Comments        Pertinent Vitals/Pain Pain Assessment: No/denies pain    Home Living                      Prior Function            PT Goals (current goals can now be found in the care plan section) Acute Rehab PT Goals Patient Stated Goal: to go home today Progress towards PT goals: Progressing toward goals    Frequency    Min 3X/week      PT Plan Current plan remains appropriate    Co-evaluation              AM-PAC PT "6 Clicks" Daily Activity  Outcome Measure  Difficulty turning over in bed (including adjusting bedclothes, sheets and blankets)?: None Difficulty moving from lying on back  to sitting on the side of the bed? : None Difficulty sitting down on and standing up from a chair with arms (e.g., wheelchair, bedside commode, etc,.)?: A Little Help needed moving to and from a bed to chair (including a wheelchair)?: A Little Help needed walking in hospital room?: A Little Help needed climbing 3-5 steps with a railing? : A Little 6 Click Score: 20    End of Session Equipment Utilized During Treatment: Gait belt Activity Tolerance: Patient tolerated treatment well Patient left: in chair;with call bell/phone within reach;with family/visitor present;with chair alarm set Nurse Communication: Mobility status PT Visit Diagnosis: Muscle weakness  (generalized) (M62.81);Difficulty in walking, not elsewhere classified (R26.2)     Time: 3833-3832 PT Time Calculation (min) (ACUTE ONLY): 21 min  Charges:  $Gait Training: 8-22 mins                    G Codes:       Scheryl Marten PT, DPT  [phone]    Jacqulyn Liner Sloan Leiter 11/06/2016, 1:42 PM

## 2016-11-06 NOTE — Discharge Instructions (Addendum)
Follow with Primary MD Leanna Battles, MD in 7 days   Get CBC, CMP, 2 view Chest X ray checked  by Primary MD or SNF MD in 5-7 days ( we routinely change or add medications that can affect your baseline labs and fluid status, therefore we recommend that you get the mentioned basic workup next visit with your PCP, your PCP may decide not to get them or add new tests based on their clinical decision)  Activity: As tolerated with Full fall precautions use walker/cane & assistance as needed  Disposition Home   Diet:  Heart Healthy - Check your Weight same time everyday, if you gain over 2 pounds, or you develop in leg swelling, experience more shortness of breath or chest pain, call your Primary MD immediately. Follow Cardiac Low Salt Diet and 1.5 lit/day fluid restriction.  On your next visit with your primary care physician please Get Medicines reviewed and adjusted.  Please request your Prim.MD to go over all Hospital Tests and Procedure/Radiological results at the follow up, please get all Hospital records sent to your Prim MD by signing hospital release before you go home.  If you experience worsening of your admission symptoms, develop shortness of breath, life threatening emergency, suicidal or homicidal thoughts you must seek medical attention immediately by calling 911 or calling your MD immediately  if symptoms less severe.  You Must read complete instructions/literature along with all the possible adverse reactions/side effects for all the Medicines you take and that have been prescribed to you. Take any new Medicines after you have completely understood and accpet all the possible adverse reactions/side effects.   Do not drive, operate heavy machinery, perform activities at heights, swimming or participation in water activities or provide baby sitting services if your were admitted for syncope or siezures until you have seen by Primary MD or a Neurologist and advised to do so again.  Do  not drive when taking Pain medications.    Do not take more than prescribed Pain, Sleep and Anxiety Medications  Special Instructions: If you have smoked or chewed Tobacco  in the last 2 yrs please stop smoking, stop any regular Alcohol  and or any Recreational drug use.  Wear Seat belts while driving.   Please note  You were cared for by a hospitalist during your hospital stay. If you have any questions about your discharge medications or the care you received while you were in the hospital after you are discharged, you can call the unit and asked to speak with the hospitalist on call if the hospitalist that took care of you is not available. Once you are discharged, your primary care physician will handle any further medical issues. Please note that NO REFILLS for any discharge medications will be authorized once you are discharged, as it is imperative that you return to your primary care physician (or establish a relationship with a primary care physician if you do not have one) for your aftercare needs so that they can reassess your need for medications and monitor your lab values.   Information on my medicine - Pradaxa (dabigatran)  This medication education was reviewed with me or my healthcare representative as part of my discharge preparation.  Why was Pradaxa prescribed for you? Pradaxa was prescribed for you to reduce the risk of forming blood clots that cause a stroke if you have a medical condition called atrial fibrillation (a type of irregular heartbeat).    What do you Need to know about  PradAXa? Take your Pradaxa TWICE DAILY - one capsule in the morning and one tablet in the evening with or without food.  It would be best to take the doses about the same time each day.  The capsules should not be broken, chewed or opened - they must be swallowed whole.  Do not store Pradaxa in other medication containers - once the bottle is opened the Pradaxa should be used within  FOUR months; throw away any capsules that havent been by that time.  Take Pradaxa exactly as prescribed by your doctor.  DO NOT stop taking Pradaxa without talking to the doctor who prescribed the medication.  Stopping without other stroke prevention medication to take the place of Pradaxa may increase your risk of developing a clot that causes a stroke.  Refill your prescription before you run out.  After discharge, you should have regular check-up appointments with your healthcare provider that is prescribing your Pradaxa.  In the future your dose may need to be changed if your kidney function or weight changes by a significant amount.  What do you do if you miss a dose? If you miss a dose, take it as soon as you remember on the same day.  If your next dose is less than 6 hours away, skip the missed dose.  Do not take two doses of PRADAXA at the same time.  Important Safety Information A possible side effect of Pradaxa is bleeding. You should call your healthcare provider right away if you experience any of the following: ? Bleeding from an injury or your nose that does not stop. ? Unusual colored urine (red or dark brown) or unusual colored stools (red or black). ? Unusual bruising for unknown reasons. ? A serious fall or if you hit your head (even if there is no bleeding).  Some medicines may interact with Pradaxa and might increase your risk of bleeding or clotting while on Pradaxa. To help avoid this, consult your healthcare provider or pharmacist prior to using any new prescription or non-prescription medications, including herbals, vitamins, non-steroidal anti-inflammatory drugs (NSAIDs) and supplements.  This website has more information on Pradaxa (dabigatran): [email or web address]://www.pradaxa.com

## 2016-11-06 NOTE — Care Management Note (Signed)
Case Management Note  Patient Details  Name: JARIAH TARKOWSKI MRN: [id number] Date of Birth: 1919/05/01  Subjective/Objective:     Admitted with Chest pain              Action/Plan: Patient lives at [place]; PCP: Donnajean Lopes, MD; has private insurance with Medicare / Christella Scheuermann with prescription drug coverage; [facility]; Patient stated that she already has [place] services provided by [facility]); Mary with [facility] called to confirm [place] arrangements; DME- she has a walker and cane at home; her granddaughter delivers her meals and cooks for her; CM will continue to follow for DCP.  Expected Discharge Date:     Possibly 11/07/2016             Expected Discharge Plan:  [place]  In-House Referral:   [facility] [address]  Discharge planning Services  CM Consult  Choice offered to:  Patient  HH Arranged:  RN, PT [place] Agency:  [facility] ([facility])  Status of Service:    In progress  Sherrilyn Rist [phone] 11/06/2016, 2:34 PM

## 2016-11-06 NOTE — Progress Notes (Signed)
PROGRESS NOTE                                                                                                                                                                                                             Patient Demographics:    Lori [name], is a 83 y.o. female, DOB - [birth year], VXB:[id number]  Admit date - 11/04/2016   Admitting Physician Waldemar Dickens, MD  Outpatient Primary MD for the patient is Donnajean Lopes, MD  LOS - 2  Chief Complaint  Patient presents with  . Chest Pain       Brief Narrative  Lori [name] is a 83 y.o. female with medical history significant for chronic lower extremity edema with recurrent cellulitis currently utilizing UNNA boots, known urinary hypertension with tricuspid regurgitation as well as moderate mitral regurgitation based on tachycardia in 2011, current treatment for cellulitis of both legs, chronic atrial fibrillation on Pradaxa, dyslipidemia, hypertension and iron deficiency anemia. Patient reports that she was in her usual state of health when she woke up around 3 AM with coughing episode, Further workup suggested CHF with possible bronchitis. She was also found to be in A. fib with RVR.   Subjective:   Patient in bed, appears comfortable, denies any headache, no fever, no chest pain or pressure, much improved shortness of breath , no abdominal pain. No focal weakness.    Assessment  & Plan :     1.Atypical chest pain. Currently resolved. Troponin trend stable and in non-ACS pattern, currently on beta blocker, Pradaxa. Remains symptom-free, Cardarelli following, await echocardiogram to evaluate EF and wall motion, continue supportive care.  2. Acute on chronic diastolic CHF. EF preserved on previous echo, present echocardiogram pending. Now compensated likely went into CHF due to RVR, transition to oral Lasix, continue beta blocker, await echocardiogram,  cardiology on board. So for 1 L negative rate is 178 pounds.  3. History of cellulitis. Currently appears stable could have been redness due to edema but will monitor.  4. Pneumonia versus bronchitis upon admission. Continue Rocephin and azithromycin and monitor clinically.  5. Chronic A. fib was in RVR upon admission. [place] vasc 2 score 5. On combination of calcium channel blocker, beta blocker and Pradaxa all continued, cardiology monitoring and following, currently in rate control.  6. Chronic iron deficiency anemia. Stable outpatient age-appropriate  workup by PCP.  7. ARF. Likely due to CHF diuresis and monitor.  8. Dyslipidemia. Diet controlled monitor.  9. Hypertension. Continue combination of beta blocker and calcium channel blocker.      Diet : Diet Heart Room service appropriate? Yes; Fluid consistency: Thin    Family Communication  : Granddaughter over the phone in [place]  Code Status :  DNR  Disposition Plan  :  HHPT  Consults  :  Cards  Procedures  :    TTE  DVT Prophylaxis  :  Pradaxa  Lab Results  Component Value Date   PLT 161 11/06/2016    Inpatient Medications  Scheduled Meds: . dabigatran  75 mg Oral Q12H  . diltiazem  120 mg Oral Daily  . furosemide  40 mg Oral Daily  . latanoprost  1 drop Both Eyes QHS  . loratadine  10 mg Oral QHS  . metoprolol tartrate  25 mg Oral BID  . pantoprazole  40 mg Oral QHS  . spironolactone  25 mg Oral Daily  . timolol  1 drop Left Eye BID   Continuous Infusions: . azithromycin Stopped (11/05/16 1710)  . cefTRIAXone (ROCEPHIN)  IV Stopped (11/05/16 1827)   PRN Meds:.acetaminophen, albuterol, prochlorperazine, traMADol  Antibiotics  :    Anti-infectives    Start     Dose/Rate Route Frequency Ordered Stop   11/04/16 2200  clindamycin (CLEOCIN) capsule 300 mg  Status:  Discontinued     300 mg Oral 3 times daily 11/04/16 1825 11/04/16 1940   11/04/16 1600  cefTRIAXone (ROCEPHIN) 1 g in dextrose 5 % 50 mL  IVPB     1 g 100 mL/hr over 30 Minutes Intravenous Every 24 hours 11/04/16 1540 11/11/16 1559   11/04/16 1600  azithromycin (ZITHROMAX) 500 mg in dextrose 5 % 250 mL IVPB     500 mg 250 mL/hr over 60 Minutes Intravenous Every 24 hours 11/04/16 1540 11/11/16 1559   11/04/16 1200  amoxicillin-clavulanate (AUGMENTIN) 875-125 MG per tablet 1 tablet     1 tablet Oral  Once 11/04/16 1150 11/04/16 1230         Objective:   Vitals:   11/05/16 0946 11/05/16 1304 11/05/16 2032 11/06/16 0500  BP: (!) 110/51 (!) 100/52 (!) 106/44 (!) 108/50  Pulse: 62 70 88 75  Resp: 18 16 17 16   Temp: 98 F (36.7 C) 97.5 F (36.4 C) 98.9 F (37.2 C) 98.5 F (36.9 C)  TempSrc: Oral Oral Oral Oral  SpO2: 97% 96% 98% 99%  Weight:    81 kg (178 lb 8 oz)  Height:        Wt Readings from Last 3 Encounters:  11/06/16 81 kg (178 lb 8 oz)  09/04/16 85.5 kg (188 lb 6.4 oz)  05/26/16 82.1 kg (181 lb)     Intake/Output Summary (Last 24 hours) at 11/06/16 1118 Last data filed at 11/06/16 0226  Gross per 24 hour  Intake              660 ml  Output             1600 ml  Net             -940 ml     Physical Exam  Awake Alert, Oriented X 3, No new F.N deficits, Normal affect [place].AT,PERRAL Supple Neck,No JVD, No cervical lymphadenopathy appriciated.  Symmetrical Chest wall movement, Good air movement bilaterally, CTAB RRR,No Gallops,Rubs or new Murmurs, No Parasternal Heave +ve B.Sounds, Abd  Soft, No tenderness, No organomegaly appriciated, No rebound - guarding or rigidity. No Cyanosis, Clubbing , chronic trace edema, No new Rash or bruise     Data Review:    CBC  Recent Labs Lab 11/04/16 0952 11/06/16 0329  WBC 11.8* 9.3  HGB 11.4* 10.6*  HCT 36.7 34.2*  PLT 180 161  MCV 65.4* 64.7*  MCH 20.3* 20.0*  MCHC 31.1 31.0  RDW 15.7* 15.5  LYMPHSABS 1.2  --   MONOABS 1.2*  --   EOSABS 0.1  --   BASOSABS 0.1  --     Chemistries   Recent Labs Lab 11/04/16 0952 11/05/16 0318  11/06/16 0329  NA 139 135 137  K 4.8 3.7 3.6  CL 104 98* 98*  CO2 23 28 25   GLUCOSE 109* 127* 102*  BUN 59* 48* 39*  CREATININE 1.73* 1.70* 1.61*  CALCIUM 9.5 8.7* 8.7*  AST 28  --   --   ALT 12*  --   --   ALKPHOS 65  --   --   BILITOT 1.3*  --   --    ------------------------------------------------------------------------------------------------------------------ No results for input(s): CHOL, HDL, LDLCALC, TRIG, CHOLHDL, LDLDIRECT in the last 72 hours.  No results found for: HGBA1C ------------------------------------------------------------------------------------------------------------------ No results for input(s): TSH, T4TOTAL, T3FREE, THYROIDAB in the last 72 hours.  Invalid input(s): FREET3 ------------------------------------------------------------------------------------------------------------------ No results for input(s): VITAMINB12, FOLATE, FERRITIN, TIBC, IRON, RETICCTPCT in the last 72 hours.  Coagulation profile No results for input(s): INR, PROTIME in the last 168 hours.   Recent Labs  11/04/16 0952  DDIMER 0.83*    Cardiac Enzymes  Recent Labs Lab 11/04/16 1637 11/04/16 2057 11/05/16 0318  TROPONINI 0.05* 0.07* 0.10*   ------------------------------------------------------------------------------------------------------------------    Component Value Date/Time   BNP 499.2 (H) 11/04/2016 0952    Micro Results Recent Results (from the past 240 hour(s))  Respiratory Panel by PCR     Status: None   Collection Time: 11/04/16  3:43 PM  Result Value Ref Range Status   Adenovirus NOT DETECTED NOT DETECTED Final   Coronavirus 229E NOT DETECTED NOT DETECTED Final   Coronavirus HKU1 NOT DETECTED NOT DETECTED Final   Coronavirus NL63 NOT DETECTED NOT DETECTED Final   Coronavirus OC43 NOT DETECTED NOT DETECTED Final   Metapneumovirus NOT DETECTED NOT DETECTED Final   Rhinovirus / Enterovirus NOT DETECTED NOT DETECTED Final   Influenza A NOT  DETECTED NOT DETECTED Final   Influenza B NOT DETECTED NOT DETECTED Final   Parainfluenza Virus 1 NOT DETECTED NOT DETECTED Final   Parainfluenza Virus 2 NOT DETECTED NOT DETECTED Final   Parainfluenza Virus 3 NOT DETECTED NOT DETECTED Final   Parainfluenza Virus 4 NOT DETECTED NOT DETECTED Final   Respiratory Syncytial Virus NOT DETECTED NOT DETECTED Final   Bordetella pertussis NOT DETECTED NOT DETECTED Final   Chlamydophila pneumoniae NOT DETECTED NOT DETECTED Final   Mycoplasma pneumoniae NOT DETECTED NOT DETECTED Final  Culture, blood (routine x 2) Call MD if unable to obtain prior to antibiotics being given     Status: None (Preliminary result)   Collection Time: 11/04/16  4:25 PM  Result Value Ref Range Status   Specimen Description BLOOD RIGHT ARM  Final   Special Requests   Final    BOTTLES DRAWN AEROBIC AND ANAEROBIC Blood Culture adequate volume   Culture NO GROWTH < 24 HOURS  Final   Report Status PENDING  Incomplete  Culture, blood (routine x 2) Call MD if unable to obtain prior  to antibiotics being given     Status: None (Preliminary result)   Collection Time: 11/04/16  4:30 PM  Result Value Ref Range Status   Specimen Description BLOOD RIGHT ANTECUBITAL  Final   Special Requests   Final    BOTTLES DRAWN AEROBIC AND ANAEROBIC Blood Culture adequate volume   Culture NO GROWTH < 24 HOURS  Final   Report Status PENDING  Incomplete  Culture, Urine     Status: Abnormal   Collection Time: 11/05/16 12:57 PM  Result Value Ref Range Status   Specimen Description URINE, CLEAN CATCH  Final   Special Requests NONE  Final   Culture <10,000 COLONIES/mL INSIGNIFICANT GROWTH (A)  Final   Report Status 11/06/2016 FINAL  Final    Radiology Reports Dg Chest 2 View  Result Date: 11/05/2016 CLINICAL DATA:  Chest pain and shortness of breath.  Follow-up. EXAM: CHEST  2 VIEW COMPARISON:  Nov 04, 2016 FINDINGS: Cardiomegaly. Bilateral pleural effusions and pulmonary edema remain. No other  acute interval changes. IMPRESSION: Cardiomegaly with bilateral pleural effusions and mild edema. Electronically Signed   By: Dorise Bullion III M.D   On: 11/05/2016 08:26   Dg Chest 2 View  Result Date: 11/04/2016 CLINICAL DATA:  Awakened this morning with strong productive cough with subsequent development of chest pain and shortness of breath. History of asthma, allergies, atrial fibrillation, former smoker. EXAM: CHEST  2 VIEW COMPARISON:  Chest x-ray of July 09, 2012 FINDINGS: The lungs are well-expanded. The interstitial markings are coarse. There it is increased density at both lung bases. The cardiac silhouette is enlarged. The pulmonary vascularity is mildly engorged. There is calcification in the wall of the aortic arch. There may be small pleural effusions bilaterally. The bony thorax exhibits no acute abnormality. IMPRESSION: CHF with mild pulmonary vascular congestion and interstitial edema. Probable small bilateral pleural effusions. Bibasilar atelectasis or pneumonia. Followup PA and lateral chest X-ray is recommended in 3-4 weeks following trial of antibiotic therapy to ensure resolution. Electronically Signed   By: David  Martinique M.D.   On: 11/04/2016 10:13    Time Spent in minutes  30   Lala Lund M.D on 11/06/2016 at 11:18 AM  Between 7am to 7pm - Pager - [phone] ( page via [email or web address], text pages only, please mention full 10 digit call back number). After 7pm go to [email or web address] - password [facility]

## 2016-11-06 NOTE — Progress Notes (Signed)
Progress Note  Patient Name: Lori [name] Date of Encounter: 11/06/2016  Primary Cardiologist: Lovena Le  Subjective   She is feeling well.  Denies CP or SOB.  No cough.  She is urinating well.  Inpatient Medications    Scheduled Meds: . dabigatran  75 mg Oral Q12H  . diltiazem  120 mg Oral Daily  . latanoprost  1 drop Both Eyes QHS  . loratadine  10 mg Oral QHS  . metoprolol tartrate  25 mg Oral BID  . pantoprazole  40 mg Oral QHS  . spironolactone  25 mg Oral Daily  . timolol  1 drop Left Eye BID   Continuous Infusions: . azithromycin Stopped (11/05/16 1710)  . cefTRIAXone (ROCEPHIN)  IV Stopped (11/05/16 1827)   PRN Meds: acetaminophen, albuterol, prochlorperazine, traMADol   Vital Signs    Vitals:   11/05/16 0946 11/05/16 1304 11/05/16 2032 11/06/16 0500  BP: (!) 110/51 (!) 100/52 (!) 106/44 (!) 108/50  Pulse: 62 70 88 75  Resp: 18 16 17 16   Temp: 98 F (36.7 C) 97.5 F (36.4 C) 98.9 F (37.2 C) 98.5 F (36.9 C)  TempSrc: Oral Oral Oral Oral  SpO2: 97% 96% 98% 99%  Weight:    178 lb 8 oz (81 kg)  Height:        Intake/Output Summary (Last 24 hours) at 11/06/16 1014 Last data filed at 11/06/16 0226  Gross per 24 hour  Intake              660 ml  Output             1600 ml  Net             -940 ml   Filed Weights   11/04/16 1755 11/05/16 0529 11/06/16 0500  Weight: 186 lb 3.2 oz (84.5 kg) 187 lb 14.4 oz (85.2 kg) 178 lb 8 oz (81 kg)    Telemetry    No further episodes of tachycardia.  ECG    5/2: atrial fibrillation, old lateral infarct   Physical Exam   GEN: No acute distress, sitting up in chair, hard of hearing.   Neck: No JVD Cardiac: irregularly irregular, no murmurs, rubs, or gallops.  No chest wall TTP Respiratory: on room air, normal effort, CTAB. GI: Soft, nontender, non-distended  MS: chronic LE edema w/ venous stasis changes Neuro:  Nonfocal  Psych: Normal affect   Labs    Chemistry  Recent Labs Lab 11/04/16 0952  11/05/16 0318 11/06/16 0329  NA 139 135 137  K 4.8 3.7 3.6  CL 104 98* 98*  CO2 23 28 25   GLUCOSE 109* 127* 102*  BUN 59* 48* 39*  CREATININE 1.73* 1.70* 1.61*  CALCIUM 9.5 8.7* 8.7*  PROT 6.9  --   --   ALBUMIN 3.8  --   --   AST 28  --   --   ALT 12*  --   --   ALKPHOS 65  --   --   BILITOT 1.3*  --   --   GFRNONAA 24* 24* 26*  GFRAA 28* 28* 30*  ANIONGAP 12 9 14      Hematology  Recent Labs Lab 11/04/16 0952 11/06/16 0329  WBC 11.8* 9.3  RBC 5.61* 5.29*  HGB 11.4* 10.6*  HCT 36.7 34.2*  MCV 65.4* 64.7*  MCH 20.3* 20.0*  MCHC 31.1 31.0  RDW 15.7* 15.5  PLT 180 161    Cardiac Enzymes  Recent Labs Lab 11/04/16 1637  11/04/16 2057 11/05/16 0318  TROPONINI 0.05* 0.07* 0.10*     Recent Labs Lab 11/04/16 0957 11/04/16 1514  TROPIPOC 0.03 0.03     BNP  Recent Labs Lab 11/04/16 0952  BNP 499.2*     DDimer   Recent Labs Lab 11/04/16 0952  DDIMER 0.83*     Radiology    Dg Chest 2 View  Result Date: 11/05/2016 CLINICAL DATA:  Chest pain and shortness of breath.  Follow-up. EXAM: CHEST  2 VIEW COMPARISON:  Nov 04, 2016 FINDINGS: Cardiomegaly. Bilateral pleural effusions and pulmonary edema remain. No other acute interval changes. IMPRESSION: Cardiomegaly with bilateral pleural effusions and mild edema. Electronically Signed   By: Dorise Bullion III M.D   On: 11/05/2016 08:26    Cardiac Studies   Echo: in process  Patient Profile     83 y.o. female admitted with atypical CP, weight gain, cough, and low grade fevers who had an episode of tachycardia in the ED.  Assessment & Plan    # Chest Pain with mild troponin elevation: Her chest pain has been atypical and resolved at this time with volume removal.  Slight troponin elevation may have been secondary to tachycradia vs underlying infection +/- renal insufficiency.  She has diuresed well overnight on Lasix 40 IV BID.  Weight is down 10 pounds.  # Atrial Fibrillation: She is on Pradaxa, BB,  and verapamil.  Verapamil is being held at this time and diltiazem was added yesterday by primary service.  No further episodes of tachycardia - Telemetry  # Exacerbation of HFpEF:  Weight is down 10 pounds today.  Symptomatically breathing is improved and she appears to have improved her volume status.  Renal function is better today with creatinine 1.6 - Echo pending - Resume home Lasix 40mg  PO daily and then PRN additional dose when home for extra weight.  # HTN: Her home meds are metoprolol, spironolactone, verapamil.  She is normotensive here on metoprolol, spironolactone and now diltiazem.  # Renal Insufficiency: Creatinine is stable this morning at 1.6 with baseline range of 1.1-1.9 over the past few years but no recent comparison since 2014.  Electrolytes are okay as well. - monitor carefully with diuresis  Signed, Jule Ser, DO  11/06/2016, 10:14 AM    I have personally seen and examined this patient with Dr. Juleen China. I agree with the assessment and plan as outlined above. Her chest pain has resolved. Good diuresis. Echo today. If no major abnormalities on echo, can d/c home with Lasix 40 mg daily. Use extra Lasix as needed for weight gain. Troponin elevation likely due to demand ischemia. No plans for invasive evaluation.     Lauree Chandler 11/06/2016 11:09 AM

## 2016-11-06 NOTE — Progress Notes (Signed)
  Echocardiogram 2D Echocardiogram has been performed.  Tresa Res 11/06/2016, 12:27 PM

## 2016-11-07 LAB — BASIC METABOLIC PANEL
ANION GAP: 11 (ref 5–15)
BUN: 39 mg/dL — AB (ref 6–20)
CALCIUM: 8.8 mg/dL — AB (ref 8.9–10.3)
CO2: 29 mmol/L (ref 22–32)
Chloride: 96 mmol/L — ABNORMAL LOW (ref 101–111)
Creatinine, Ser: 1.56 mg/dL — ABNORMAL HIGH (ref 0.44–1.00)
GFR calc Af Amer: 31 mL/min — ABNORMAL LOW (ref >60)
GFR, EST NON AFRICAN AMERICAN: 27 mL/min — AB (ref >60)
GLUCOSE: 95 mg/dL (ref 65–99)
Potassium: 3.6 mmol/L (ref 3.5–5.1)
Sodium: 136 mmol/L (ref 135–145)

## 2016-11-07 MED ORDER — AZITHROMYCIN 250 MG PO TABS: 250.0000 mg | ORAL_TABLET | Freq: Every day | ORAL | Status: DC

## 2016-11-07 MED ORDER — FUROSEMIDE 20 MG PO TABS: ORAL_TABLET | ORAL | 0 refills | Status: DC

## 2016-11-07 MED ORDER — AZITHROMYCIN 250 MG PO TABS: 250.0000 mg | ORAL_TABLET | Freq: Every day | ORAL | 0 refills | Status: DC

## 2016-11-07 MED ORDER — DILTIAZEM HCL ER COATED BEADS 120 MG PO CP24: 120.0000 mg | ORAL_CAPSULE | Freq: Every day | ORAL | 0 refills | Status: DC

## 2016-11-07 NOTE — Progress Notes (Signed)
Occupational Therapy Treatment Patient Details Name: Lori [name] MRN: [id number] DOB: 01-03-1919 Today's Date: 11/07/2016    History of present illness Presents with atypical chest pain with episodic rapid atrial flutter in patient his chronic atrial fibrillation. Also had low-grade fevers and chest x-ray inconclusive for heart failure, pneumonia or both. PMHx: HTN, Chronic acquired lymphedema. Beatrice Lecher   OT comments  Pt. Able to complete toilet transfer and pericare with min guard a.  Cues for hand placement for safety during stand/sit.  Eager for d/c home later today  Follow Up Recommendations  Home health OT;Supervision - Intermittent    Equipment Recommendations       Recommendations for Other Services      Precautions / Restrictions Precautions Precautions: Fall       Mobility Bed Mobility               General bed mobility comments: seated on bsc when session began  Transfers Overall transfer level: Needs assistance Equipment used: Rolling walker (2 wheeled) Transfers: Sit to/from [facility] Sit to Stand: Supervision Stand pivot transfers: Supervision       General transfer comment: cues to reach for arm rests before sitting down as pt. did "plop" into recliner.      Balance                                           ADL either performed or assessed with clinical judgement   ADL Overall ADL's : Needs assistance/impaired                         Toilet Transfer: Min guard;BSC;RW;Stand-pivot   Toileting- Clothing Manipulation and Hygiene: Supervision/safety;Sit to/from stand       Functional mobility during ADLs: Min guard       Vision       Perception     Praxis      Cognition Arousal/Alertness: Awake/alert Behavior During Therapy: WFL for tasks assessed/performed Overall Cognitive Status: Within Functional Limits for tasks assessed                                           Exercises     Shoulder Instructions       General Comments      Pertinent Vitals/ Pain       Pain Assessment: No/denies pain  Home Living                                          Prior Functioning/Environment              Frequency  Min 2X/week        Progress Toward Goals  OT Goals(current goals can now be found in the care plan section)  Progress towards OT goals: Progressing toward goals     Plan Discharge plan remains appropriate    Co-evaluation                 AM-PAC PT "6 Clicks" Daily Activity     Outcome Measure   Help from another person eating meals?: None Help from another person taking care of personal grooming?: None Help  from another person toileting, which includes using toliet, bedpan, or urinal?: A Little Help from another person bathing (including washing, rinsing, drying)?: A Little Help from another person to put on and taking off regular upper body clothing?: None Help from another person to put on and taking off regular lower body clothing?: A Little 6 Click Score: 21    End of Session Equipment Utilized During Treatment: Gait belt;Rolling walker  OT Visit Diagnosis: Unsteadiness on feet (R26.81)   Activity Tolerance Patient tolerated treatment well   Patient Left in chair;with call bell/phone within reach   Nurse Communication          Time: 9692-4932 OT Time Calculation (min): 18 min  Charges: OT General Charges $OT Visit: 1 Procedure OT Treatments $Self Care/Home Management : 8-22 mins   Janice Coffin 11/07/2016, 9:48 AM

## 2016-11-07 NOTE — Discharge Summary (Signed)
Lori [name] EQA:[id number] DOB: Jul 16, 1918 DOA: 11/04/2016  PCP: Leanna Battles, MD  Admit date: 11/04/2016  Discharge date: 11/07/2016  Admitted From: Home   Disposition:  Home   Recommendations for Outpatient Follow-up:   Follow up with PCP in 1-2 weeks  PCP Please obtain BMP/CBC, 2 view CXR in 1week,  (see Discharge instructions)   PCP Please follow up on the following pending results: Monitor BMP, CXR   Home Health: PT, RN   Equipment/Devices: None  Consultations: Cards Discharge Condition: Stable   CODE STATUS: DNR   Diet Recommendation:  Heart Healthy with 1.5 L fluid restriction in 24hrs   Chief Complaint  Patient presents with  . Chest Pain     Brief history of present illness from the day of admission and additional interim summary    [name] Broylesis a 83 y.o.femalewith medical history significant for chronic lower extremity edema with recurrent cellulitis currently utilizing UNNAboots, known urinary hypertension with tricuspid regurgitation as well as moderate mitral regurgitation based on tachycardia in 2011, current treatment for cellulitis of both legs, chronic atrial fibrillation on Pradaxa, dyslipidemia, hypertension and iron deficiency anemia. Patient reports that she was in her usual state of health when she woke up around 3 AM with coughing episode, Further workup suggested CHF with possible bronchitis. She was also found to be in A. fib with RVR.                                                                 Hospital Course    1.Atypical chest pain. Upon admission, thereafter remained chest pain-free. Troponin trend stable and in non-ACS pattern, currently on beta blocker, Pradaxa. Seen by cardiology, echocardiogram stable with preserved EF and no wall motion abnormalities, seen  and cleared by cardiology for home discharge, will follow with cardiology outpatient post discharge.  2. Acute on chronic diastolic CHF. EF preserved at 60%,  echocardiogram nonacute. Now compensated likely went into CHF due to RVR, transitioned to oral Lasix, continue beta blocker, current weight is 177 pounds, will be discharged home on oral Lasix and Coreg with outpatient cardiology and PCP follow-up, request PCP to monitor weight and BMP along with diuretic dose closely.  3. History of cellulitis. Currently appears stable could have been redness due to edema, continue home health and Ace wraps at home, continue diuresis as above, I have stopped clindamycin for now request PCP to reevaluate if needed.  4. Pneumonia versus bronchitis upon admission. As bonded very well to Rocephin and azithromycin, will be transitioned to oral azithromycin for few more days thereafter follow with PCP, request PCP to check 2 view chest x-ray within 7-10 days.  5. Chronic A. fib was in RVR upon admission. [place] vasc 2 score 5. On combination of calcium channel blocker, beta blocker and  Pradaxa all continued, continue this combination upon discharge, cleared by cardiology for the same.  6. Chronic iron deficiency anemia. Stable outpatient age-appropriate workup by PCP.  7. ARF on CK D3. Baseline creatinine appears to be close to 1.5. Close to baseline, PCP to monitor renal function closely.  8. Dyslipidemia. Diet controlled monitor.  9. Hypertension. Continue combination of beta blocker and calcium channel blocker.   Discharge diagnosis     Principal Problem:   Chest pain Active Problems:   ANEMIA, IRON DEFICIENCY   Cough   Atrial fibrillation (HCC)   Peripheral edema   Cellulitis of leg   Essential hypertension   Moderate mitral regurgitation   Elevated brain natriuretic peptide (BNP) level   Pulmonary HTN (HCC)   Acute kidney injury (HCC)   HLD (hyperlipidemia)   Acute systolic heart  failure (HCC)   Acute exacerbation of CHF (congestive heart failure) (HCC)   Atypical chest pain   Acute diastolic CHF (congestive heart failure) (HCC)   Lobar pneumonia (HCC)   Pressure injury of skin    Discharge instructions    Discharge Instructions    Discharge instructions    Complete by:  As directed    Follow with Primary MD Leanna Battles, MD in 7 days   Get CBC, CMP, 2 view Chest X ray checked  by Primary MD or SNF MD in 5-7 days ( we routinely change or add medications that can affect your baseline labs and fluid status, therefore we recommend that you get the mentioned basic workup next visit with your PCP, your PCP may decide not to get them or add new tests based on their clinical decision)  Activity: As tolerated with Full fall precautions use walker/cane & assistance as needed  Disposition Home   Diet:  Heart Healthy - Check your Weight same time everyday, if you gain over 2 pounds, or you develop in leg swelling, experience more shortness of breath or chest pain, call your Primary MD immediately. Follow Cardiac Low Salt Diet and 1.5 lit/day fluid restriction.  On your next visit with your primary care physician please Get Medicines reviewed and adjusted.  Please request your Prim.MD to go over all Hospital Tests and Procedure/Radiological results at the follow up, please get all Hospital records sent to your Prim MD by signing hospital release before you go home.  If you experience worsening of your admission symptoms, develop shortness of breath, life threatening emergency, suicidal or homicidal thoughts you must seek medical attention immediately by calling 911 or calling your MD immediately  if symptoms less severe.  You Must read complete instructions/literature along with all the possible adverse reactions/side effects for all the Medicines you take and that have been prescribed to you. Take any new Medicines after you have completely understood and accpet all  the possible adverse reactions/side effects.   Do not drive, operate heavy machinery, perform activities at heights, swimming or participation in water activities or provide baby sitting services if your were admitted for syncope or siezures until you have seen by Primary MD or a Neurologist and advised to do so again.  Do not drive when taking Pain medications.    Do not take more than prescribed Pain, Sleep and Anxiety Medications  Special Instructions: If you have smoked or chewed Tobacco  in the last 2 yrs please stop smoking, stop any regular Alcohol  and or any Recreational drug use.  Wear Seat belts while driving.   Please note  You were cared for  by a hospitalist during your hospital stay. If you have any questions about your discharge medications or the care you received while you were in the hospital after you are discharged, you can call the unit and asked to speak with the hospitalist on call if the hospitalist that took care of you is not available. Once you are discharged, your primary care physician will handle any further medical issues. Please note that NO REFILLS for any discharge medications will be authorized once you are discharged, as it is imperative that you return to your primary care physician (or establish a relationship with a primary care physician if you do not have one) for your aftercare needs so that they can reassess your need for medications and monitor your lab values.   Increase activity slowly    Complete by:  As directed       Discharge Medications   Allergies as of 11/07/2016      Reactions   Dust Mite Extract Shortness Of Breath, Itching   Mold Extract [trichophyton Mentagrophyte] Shortness Of Breath, Itching, Cough   VERY ALLERGIC/EXPOSURE RESULTS IN TREMENDOUS COUGHING (just one symptom)   Latex Itching   And certain fabrics   Tape Other (See Comments)   SKIN IS VERY THIN AND WILL TEAR AND BRUISE EASILY!! PLEASE USE COBAN WRAP-        Medication List    STOP taking these medications   clindamycin 300 MG capsule Commonly known as:  CLEOCIN   verapamil 120 MG 24 hr capsule Commonly known as:  VERELAN PM     TAKE these medications   ALIGN PO Take 1 capsule by mouth 2 (two) times daily.   azithromycin 250 MG tablet Commonly known as:  ZITHROMAX Take 1 tablet (250 mg total) by mouth daily.   DEBROX 6.5 % otic solution Generic drug:  carbamide peroxide Place 5 drops into both ears See admin instructions. PLACE 5 DROPS INTO EACH EAR TWICE A DAY FOR FIVE DAYS TO PREP FOR IRRIGATION NEXT WEEK   diltiazem 120 MG 24 hr capsule Commonly known as:  CARDIZEM CD Take 1 capsule (120 mg total) by mouth daily. Start taking on:  11/08/2016   fluocinonide cream 0.05 % Commonly known as:  LIDEX APPLY TO LEGS TWICE A DAY AS DIRECTED   furosemide 20 MG tablet Commonly known as:  LASIX Take 40mg  PO daily, take an extra 20mg  Pill if your weight is over 2 pounds in 24hrs or your notice extra fluid in your legs. What changed:  how much to take  how to take this  when to take this  additional instructions   ICAPS MV PO Take 1 capsule by mouth daily.   loratadine 10 MG tablet Commonly known as:  CLARITIN Take 10 mg by mouth at bedtime.   metoprolol tartrate 25 MG tablet Commonly known as:  LOPRESSOR Take 25 mg by mouth 2 (two) times daily.   multivitamin with minerals Tabs tablet Take 1 tablet by mouth at bedtime.   omeprazole 40 MG capsule Commonly known as:  PRILOSEC Take 40 mg by mouth 2 (two) times daily.   PRADAXA 75 MG Caps capsule Generic drug:  dabigatran Take 75 mg by mouth every 12 (twelve) hours.   PROVENTIL HFA 108 (90 Base) MCG/ACT inhaler Generic drug:  albuterol Inhale 2 puffs into the lungs every 6 (six) hours as needed for wheezing or shortness of breath.   spironolactone 25 MG tablet Commonly known as:  ALDACTONE Take 1 tablet (25  mg total) by mouth 3 (three) times a week. Monday  Wednesday Friday What changed:  how much to take  when to take this  additional instructions   timolol 0.5 % ophthalmic solution Commonly known as:  TIMOPTIC Place 1 drop into the left eye 2 (two) times daily.   traMADol 50 MG tablet Commonly known as:  ULTRAM Take 1 tablet by mouth See admin instructions. TWO TO THREE TIMES A DAY   TRAVATAN Z 0.004 % Soln ophthalmic solution Generic drug:  Travoprost (BAK Free) Place 1 drop into both eyes at bedtime.   vitamin C 500 MG tablet Commonly known as:  ASCORBIC ACID Take 500 mg by mouth daily.       Follow-up Information    Leanna Battles, MD. Schedule an appointment as soon as possible for a visit in 1 week(s).   Specialty:  Internal Medicine Why:  And your cardiologist within a week Contact information: [place] [address] [phone]        [facility], [facility] Follow up.   Specialty:  Home Health Services Why:  They will continue to do your  home health care at your home Contact information: [place] [address] [phone]           Major procedures and Radiology Reports - PLEASE review detailed and final reports thoroughly  -      TTE  LVEF 65-70%, moderate focal basal septal hypertrophy, trivial MR,   severe RAE, moderate LAE, moderate TR, RVSP 48 mmHg, small to   moderate sized loculated pericardial effusion, left pleural   effusion.  Dg Chest 2 View  Result Date: 11/05/2016 CLINICAL DATA:  Chest pain and shortness of breath.  Follow-up. EXAM: CHEST  2 VIEW COMPARISON:  Nov 04, 2016 FINDINGS: Cardiomegaly. Bilateral pleural effusions and pulmonary edema remain. No other acute interval changes. IMPRESSION: Cardiomegaly with bilateral pleural effusions and mild edema. Electronically Signed   By: Dorise Bullion III M.D   On: 11/05/2016 08:26   Dg Chest 2 View  Result Date: 11/04/2016 CLINICAL DATA:  Awakened this morning with strong productive cough with  subsequent development of chest pain and shortness of breath. History of asthma, allergies, atrial fibrillation, former smoker. EXAM: CHEST  2 VIEW COMPARISON:  Chest x-ray of July 09, 2012 FINDINGS: The lungs are well-expanded. The interstitial markings are coarse. There it is increased density at both lung bases. The cardiac silhouette is enlarged. The pulmonary vascularity is mildly engorged. There is calcification in the wall of the aortic arch. There may be small pleural effusions bilaterally. The bony thorax exhibits no acute abnormality. IMPRESSION: CHF with mild pulmonary vascular congestion and interstitial edema. Probable small bilateral pleural effusions. Bibasilar atelectasis or pneumonia. Followup PA and lateral chest X-ray is recommended in 3-4 weeks following trial of antibiotic therapy to ensure resolution. Electronically Signed   By: David  Martinique M.D.   On: 11/04/2016 10:13    Micro Results      Recent Results (from the past 240 hour(s))  Respiratory Panel by PCR     Status: None   Collection Time: 11/04/16  3:43 PM  Result Value Ref Range Status   Adenovirus NOT DETECTED NOT DETECTED Final   Coronavirus 229E NOT DETECTED NOT DETECTED Final   Coronavirus HKU1 NOT DETECTED NOT DETECTED Final   Coronavirus NL63 NOT DETECTED NOT DETECTED Final   Coronavirus OC43 NOT DETECTED NOT DETECTED Final   Metapneumovirus NOT DETECTED NOT DETECTED Final  Rhinovirus / Enterovirus NOT DETECTED NOT DETECTED Final   Influenza A NOT DETECTED NOT DETECTED Final   Influenza B NOT DETECTED NOT DETECTED Final   Parainfluenza Virus 1 NOT DETECTED NOT DETECTED Final   Parainfluenza Virus 2 NOT DETECTED NOT DETECTED Final   Parainfluenza Virus 3 NOT DETECTED NOT DETECTED Final   Parainfluenza Virus 4 NOT DETECTED NOT DETECTED Final   Respiratory Syncytial Virus NOT DETECTED NOT DETECTED Final   Bordetella pertussis NOT DETECTED NOT DETECTED Final   Chlamydophila pneumoniae NOT DETECTED NOT  DETECTED Final   Mycoplasma pneumoniae NOT DETECTED NOT DETECTED Final  Culture, blood (routine x 2) Call MD if unable to obtain prior to antibiotics being given     Status: None (Preliminary result)   Collection Time: 11/04/16  4:25 PM  Result Value Ref Range Status   Specimen Description BLOOD RIGHT ARM  Final   Special Requests   Final    BOTTLES DRAWN AEROBIC AND ANAEROBIC Blood Culture adequate volume   Culture NO GROWTH 2 DAYS  Final   Report Status PENDING  Incomplete  Culture, blood (routine x 2) Call MD if unable to obtain prior to antibiotics being given     Status: None (Preliminary result)   Collection Time: 11/04/16  4:30 PM  Result Value Ref Range Status   Specimen Description BLOOD RIGHT ANTECUBITAL  Final   Special Requests   Final    BOTTLES DRAWN AEROBIC AND ANAEROBIC Blood Culture adequate volume   Culture NO GROWTH 2 DAYS  Final   Report Status PENDING  Incomplete  Culture, Urine     Status: Abnormal   Collection Time: 11/05/16 12:57 PM  Result Value Ref Range Status   Specimen Description URINE, CLEAN CATCH  Final   Special Requests NONE  Final   Culture <10,000 COLONIES/mL INSIGNIFICANT GROWTH (A)  Final   Report Status 11/06/2016 FINAL  Final    Today   Subjective    Lori [name] today has no headache,no chest abdominal pain,no new weakness tingling or numbness, feels much better wants to go home today.     Objective   Blood pressure (!) 115/58, pulse 62, temperature 98.5 F (36.9 C), temperature source Oral, resp. rate 17, height 5\' 4"  (1.626 m), weight 80.3 kg (177 lb 1.6 oz), SpO2 93 %.   Intake/Output Summary (Last 24 hours) at 11/07/16 1009 Last data filed at 11/07/16 0920  Gross per 24 hour  Intake             1020 ml  Output              100 ml  Net              920 ml    Exam Awake Alert, Oriented x 3, No new F.N deficits, Normal affect [place].AT,PERRAL Supple Neck,No JVD, No cervical lymphadenopathy appriciated.  Symmetrical Chest wall  movement, Good air movement bilaterally, CTAB RRR,No Gallops,Rubs or new Murmurs, No Parasternal Heave +ve B.Sounds, Abd Soft, Non tender, No organomegaly appriciated, No rebound -guarding or rigidity. No Cyanosis, Clubbing or edema, No new Rash or bruise   Data Review   CBC w Diff:  Lab Results  Component Value Date   WBC 9.3 11/06/2016   HGB 10.6 (L) 11/06/2016   HCT 34.2 (L) 11/06/2016   PLT 161 11/06/2016   LYMPHOPCT 10 11/04/2016   MONOPCT 10 11/04/2016   EOSPCT 1 11/04/2016   BASOPCT 1 11/04/2016    CMP:  Lab Results  Component Value Date   NA 136 11/07/2016   K 3.6 11/07/2016   CL 96 (L) 11/07/2016   CO2 29 11/07/2016   BUN 39 (H) 11/07/2016   CREATININE 1.56 (H) 11/07/2016   PROT 6.9 11/04/2016   ALBUMIN 3.8 11/04/2016   BILITOT 1.3 (H) 11/04/2016   ALKPHOS 65 11/04/2016   AST 28 11/04/2016   ALT 12 (L) 11/04/2016  .   Total Time in preparing paper work, data evaluation and todays exam - 30 minutes  Lala Lund M.D on 11/07/2016 at 10:09 AM  [facility]   Office  [phone]

## 2016-11-07 NOTE — Progress Notes (Signed)
Pt. Discharged home, Discharged instructed given to granddaughter Judson Roch and grandson at the bedside. Questions answered. IV and tele removed. Prescriptions given. Taken out via wheelchair.

## 2016-11-07 NOTE — Progress Notes (Signed)
CM notified [facility] rep, Tim pt to be discharged for start of care. No other CM needs were communicated.

## 2016-11-09 DIAGNOSIS — L03116 Cellulitis of left lower limb: Secondary | ICD-10-CM | POA: Diagnosis not present

## 2016-11-09 DIAGNOSIS — I129 Hypertensive chronic kidney disease with stage 1 through stage 4 chronic kidney disease, or unspecified chronic kidney disease: Secondary | ICD-10-CM | POA: Diagnosis not present

## 2016-11-09 DIAGNOSIS — I8393 Asymptomatic varicose veins of bilateral lower extremities: Secondary | ICD-10-CM | POA: Diagnosis not present

## 2016-11-09 DIAGNOSIS — L03115 Cellulitis of right lower limb: Secondary | ICD-10-CM | POA: Diagnosis not present

## 2016-11-09 DIAGNOSIS — N183 Chronic kidney disease, stage 3 (moderate): Secondary | ICD-10-CM | POA: Diagnosis not present

## 2016-11-09 DIAGNOSIS — I4891 Unspecified atrial fibrillation: Secondary | ICD-10-CM | POA: Diagnosis not present

## 2016-11-09 LAB — CULTURE, BLOOD (ROUTINE X 2)
CULTURE: NO GROWTH
Culture: NO GROWTH
SPECIAL REQUESTS: ADEQUATE
SPECIAL REQUESTS: ADEQUATE

## 2016-11-10 DIAGNOSIS — L03116 Cellulitis of left lower limb: Secondary | ICD-10-CM | POA: Diagnosis not present

## 2016-11-10 DIAGNOSIS — N183 Chronic kidney disease, stage 3 (moderate): Secondary | ICD-10-CM | POA: Diagnosis not present

## 2016-11-10 DIAGNOSIS — I8393 Asymptomatic varicose veins of bilateral lower extremities: Secondary | ICD-10-CM | POA: Diagnosis not present

## 2016-11-10 DIAGNOSIS — I4891 Unspecified atrial fibrillation: Secondary | ICD-10-CM | POA: Diagnosis not present

## 2016-11-10 DIAGNOSIS — L03115 Cellulitis of right lower limb: Secondary | ICD-10-CM | POA: Diagnosis not present

## 2016-11-10 DIAGNOSIS — I129 Hypertensive chronic kidney disease with stage 1 through stage 4 chronic kidney disease, or unspecified chronic kidney disease: Secondary | ICD-10-CM | POA: Diagnosis not present

## 2016-11-11 DIAGNOSIS — J189 Pneumonia, unspecified organism: Secondary | ICD-10-CM | POA: Diagnosis not present

## 2016-11-11 DIAGNOSIS — N183 Chronic kidney disease, stage 3 (moderate): Secondary | ICD-10-CM | POA: Diagnosis not present

## 2016-11-11 DIAGNOSIS — H6123 Impacted cerumen, bilateral: Secondary | ICD-10-CM | POA: Diagnosis not present

## 2016-11-12 DIAGNOSIS — I129 Hypertensive chronic kidney disease with stage 1 through stage 4 chronic kidney disease, or unspecified chronic kidney disease: Secondary | ICD-10-CM | POA: Diagnosis not present

## 2016-11-12 DIAGNOSIS — N183 Chronic kidney disease, stage 3 (moderate): Secondary | ICD-10-CM | POA: Diagnosis not present

## 2016-11-12 DIAGNOSIS — L03116 Cellulitis of left lower limb: Secondary | ICD-10-CM | POA: Diagnosis not present

## 2016-11-12 DIAGNOSIS — I4891 Unspecified atrial fibrillation: Secondary | ICD-10-CM | POA: Diagnosis not present

## 2016-11-12 DIAGNOSIS — I8393 Asymptomatic varicose veins of bilateral lower extremities: Secondary | ICD-10-CM | POA: Diagnosis not present

## 2016-11-12 DIAGNOSIS — L03115 Cellulitis of right lower limb: Secondary | ICD-10-CM | POA: Diagnosis not present

## 2016-11-13 DIAGNOSIS — I130 Hypertensive heart and chronic kidney disease with heart failure and stage 1 through stage 4 chronic kidney disease, or unspecified chronic kidney disease: Secondary | ICD-10-CM | POA: Diagnosis not present

## 2016-11-13 DIAGNOSIS — I081 Rheumatic disorders of both mitral and tricuspid valves: Secondary | ICD-10-CM | POA: Diagnosis not present

## 2016-11-13 DIAGNOSIS — D509 Iron deficiency anemia, unspecified: Secondary | ICD-10-CM | POA: Diagnosis not present

## 2016-11-13 DIAGNOSIS — J181 Lobar pneumonia, unspecified organism: Secondary | ICD-10-CM | POA: Diagnosis not present

## 2016-11-13 DIAGNOSIS — I129 Hypertensive chronic kidney disease with stage 1 through stage 4 chronic kidney disease, or unspecified chronic kidney disease: Secondary | ICD-10-CM | POA: Diagnosis not present

## 2016-11-13 DIAGNOSIS — N183 Chronic kidney disease, stage 3 (moderate): Secondary | ICD-10-CM | POA: Diagnosis not present

## 2016-11-13 DIAGNOSIS — N179 Acute kidney failure, unspecified: Secondary | ICD-10-CM | POA: Diagnosis not present

## 2016-11-13 DIAGNOSIS — I4891 Unspecified atrial fibrillation: Secondary | ICD-10-CM | POA: Diagnosis not present

## 2016-11-13 DIAGNOSIS — M6281 Muscle weakness (generalized): Secondary | ICD-10-CM | POA: Diagnosis not present

## 2016-11-13 DIAGNOSIS — I272 Pulmonary hypertension, unspecified: Secondary | ICD-10-CM | POA: Diagnosis not present

## 2016-11-13 DIAGNOSIS — I5043 Acute on chronic combined systolic (congestive) and diastolic (congestive) heart failure: Secondary | ICD-10-CM | POA: Diagnosis not present

## 2016-11-13 DIAGNOSIS — E785 Hyperlipidemia, unspecified: Secondary | ICD-10-CM | POA: Diagnosis not present

## 2016-11-13 DIAGNOSIS — Z7902 Long term (current) use of antithrombotics/antiplatelets: Secondary | ICD-10-CM | POA: Diagnosis not present

## 2016-11-13 DIAGNOSIS — L03115 Cellulitis of right lower limb: Secondary | ICD-10-CM | POA: Diagnosis not present

## 2016-11-13 DIAGNOSIS — I482 Chronic atrial fibrillation: Secondary | ICD-10-CM | POA: Diagnosis not present

## 2016-11-13 DIAGNOSIS — L03116 Cellulitis of left lower limb: Secondary | ICD-10-CM | POA: Diagnosis not present

## 2016-11-13 DIAGNOSIS — I8393 Asymptomatic varicose veins of bilateral lower extremities: Secondary | ICD-10-CM | POA: Diagnosis not present

## 2016-11-18 ENCOUNTER — Encounter: Payer: Self-pay | Admitting: Podiatry

## 2016-11-18 ENCOUNTER — Ambulatory Visit (INDEPENDENT_AMBULATORY_CARE_PROVIDER_SITE_OTHER): Payer: Medicare Other | Admitting: Podiatry

## 2016-11-18 DIAGNOSIS — B351 Tinea unguium: Secondary | ICD-10-CM

## 2016-11-18 DIAGNOSIS — M79676 Pain in unspecified toe(s): Secondary | ICD-10-CM

## 2016-11-19 DIAGNOSIS — H31011 Macula scars of posterior pole (postinflammatory) (post-traumatic), right eye: Secondary | ICD-10-CM | POA: Diagnosis not present

## 2016-11-19 DIAGNOSIS — H353221 Exudative age-related macular degeneration, left eye, with active choroidal neovascularization: Secondary | ICD-10-CM | POA: Diagnosis not present

## 2016-11-19 DIAGNOSIS — H353213 Exudative age-related macular degeneration, right eye, with inactive scar: Secondary | ICD-10-CM | POA: Diagnosis not present

## 2016-11-19 NOTE — Progress Notes (Signed)
   SUBJECTIVE Patient  presents to office today complaining of elongated, thickened nails. Pain while ambulating in shoes. Patient is unable to trim their own nails.   OBJECTIVE General Patient is awake, alert, and oriented x 3 and in no acute distress. Derm Skin is dry and supple bilateral. Negative open lesions or macerations. Remaining integument unremarkable. Nails are tender, long, thickened and dystrophic with subungual debris, consistent with onychomycosis, 1-5 bilateral. No signs of infection noted. Vasc  DP and PT pedal pulses palpable bilaterally. Temperature gradient within normal limits.  Neuro Epicritic and protective threshold sensation diminished bilaterally.  Musculoskeletal Exam No symptomatic pedal deformities noted bilateral. Muscular strength within normal limits.  ASSESSMENT 1. Onychodystrophic nails 1-5 bilateral with hyperkeratosis of nails.  2. Onychomycosis of nail due to dermatophyte bilateral 3. Pain in foot bilateral  PLAN OF CARE 1. Patient evaluated today.  2. Instructed to maintain good pedal hygiene and foot care.  3. Mechanical debridement of nails 1-5 bilaterally performed using a nail nipper. Filed with dremel without incident.  4. Return to clinic in 3 mos.    Brent M. Evans, DPM [facility]  Dr. Brent M. Evans, DPM    [address]                                        [place], [place] [address]                Office [phone]  Fax [phone]      

## 2016-11-27 ENCOUNTER — Other Ambulatory Visit: Payer: Self-pay | Admitting: Internal Medicine

## 2016-11-27 DIAGNOSIS — I10 Essential (primary) hypertension: Secondary | ICD-10-CM

## 2016-12-06 DIAGNOSIS — R0789 Other chest pain: Secondary | ICD-10-CM | POA: Diagnosis not present

## 2016-12-06 DIAGNOSIS — I10 Essential (primary) hypertension: Secondary | ICD-10-CM | POA: Diagnosis not present

## 2016-12-06 DIAGNOSIS — I5033 Acute on chronic diastolic (congestive) heart failure: Secondary | ICD-10-CM | POA: Diagnosis not present

## 2016-12-06 DIAGNOSIS — D508 Other iron deficiency anemias: Secondary | ICD-10-CM | POA: Diagnosis not present

## 2016-12-06 DIAGNOSIS — N183 Chronic kidney disease, stage 3 (moderate): Secondary | ICD-10-CM | POA: Diagnosis not present

## 2016-12-06 DIAGNOSIS — E784 Other hyperlipidemia: Secondary | ICD-10-CM | POA: Diagnosis not present

## 2016-12-06 DIAGNOSIS — J189 Pneumonia, unspecified organism: Secondary | ICD-10-CM | POA: Diagnosis not present

## 2016-12-06 DIAGNOSIS — H6123 Impacted cerumen, bilateral: Secondary | ICD-10-CM | POA: Diagnosis not present

## 2016-12-06 DIAGNOSIS — Z6829 Body mass index (BMI) 29.0-29.9, adult: Secondary | ICD-10-CM | POA: Diagnosis not present

## 2016-12-06 DIAGNOSIS — H9193 Unspecified hearing loss, bilateral: Secondary | ICD-10-CM | POA: Diagnosis not present

## 2016-12-06 DIAGNOSIS — I480 Paroxysmal atrial fibrillation: Secondary | ICD-10-CM | POA: Diagnosis not present

## 2016-12-06 DIAGNOSIS — L0390 Cellulitis, unspecified: Secondary | ICD-10-CM | POA: Diagnosis not present

## 2017-01-29 DIAGNOSIS — I10 Essential (primary) hypertension: Secondary | ICD-10-CM | POA: Diagnosis not present

## 2017-01-29 DIAGNOSIS — Z1389 Encounter for screening for other disorder: Secondary | ICD-10-CM | POA: Diagnosis not present

## 2017-01-29 DIAGNOSIS — Z6827 Body mass index (BMI) 27.0-27.9, adult: Secondary | ICD-10-CM | POA: Diagnosis not present

## 2017-01-29 DIAGNOSIS — D508 Other iron deficiency anemias: Secondary | ICD-10-CM | POA: Diagnosis not present

## 2017-01-29 DIAGNOSIS — R5383 Other fatigue: Secondary | ICD-10-CM | POA: Diagnosis not present

## 2017-01-29 DIAGNOSIS — I872 Venous insufficiency (chronic) (peripheral): Secondary | ICD-10-CM | POA: Diagnosis not present

## 2017-01-29 DIAGNOSIS — E784 Other hyperlipidemia: Secondary | ICD-10-CM | POA: Diagnosis not present

## 2017-01-29 DIAGNOSIS — N183 Chronic kidney disease, stage 3 (moderate): Secondary | ICD-10-CM | POA: Diagnosis not present

## 2017-01-29 DIAGNOSIS — H9193 Unspecified hearing loss, bilateral: Secondary | ICD-10-CM | POA: Diagnosis not present

## 2017-01-29 DIAGNOSIS — Z0000 Encounter for general adult medical examination without abnormal findings: Secondary | ICD-10-CM | POA: Diagnosis not present

## 2017-02-11 DIAGNOSIS — H353221 Exudative age-related macular degeneration, left eye, with active choroidal neovascularization: Secondary | ICD-10-CM | POA: Diagnosis not present

## 2017-02-17 ENCOUNTER — Ambulatory Visit: Payer: Medicare Other | Admitting: Podiatry

## 2017-02-18 ENCOUNTER — Ambulatory Visit: Payer: Medicare Other | Admitting: Podiatry

## 2017-02-18 ENCOUNTER — Ambulatory Visit (INDEPENDENT_AMBULATORY_CARE_PROVIDER_SITE_OTHER): Payer: Medicare Other | Admitting: Podiatry

## 2017-02-18 ENCOUNTER — Encounter: Payer: Self-pay | Admitting: Podiatry

## 2017-02-18 DIAGNOSIS — M79676 Pain in unspecified toe(s): Secondary | ICD-10-CM

## 2017-02-18 DIAGNOSIS — B351 Tinea unguium: Secondary | ICD-10-CM

## 2017-02-19 NOTE — Progress Notes (Signed)
Subjective:    Patient ID: Lori [name], female   DOB: 83 y.o.   MRN: [id number]   HPI patient was noted to have thick brittle painful nailbeds 1-5 both feet that she cannot cut herself    ROS      Objective:  Physical Exam neurovascular status unchanged with thick yellow brittle nailbeds 1-5 both feet that are moderately tender when pressed     Assessment:   Mycotic nail infection with pain 1-5 both feet      Plan:    Debride painful nailbeds 1-5 both feet with no iatrogenic bleeding noted

## 2017-02-23 DIAGNOSIS — H903 Sensorineural hearing loss, bilateral: Secondary | ICD-10-CM | POA: Diagnosis not present

## 2017-03-24 ENCOUNTER — Other Ambulatory Visit: Payer: Self-pay | Admitting: Internal Medicine

## 2017-03-25 NOTE — Telephone Encounter (Signed)
Request received for Pradaxa 75mg , pt is 83 yrs old, wt-85.5kg, Crea-1.56 on 11/07/16, last seen by Dr. Lovena Le on 09/04/16, CrCl-29.70ml/min. Will send in refill request to requested Pharmacy as pt is on appropriate dose.

## 2017-04-08 DIAGNOSIS — H35362 Drusen (degenerative) of macula, left eye: Secondary | ICD-10-CM | POA: Diagnosis not present

## 2017-04-08 DIAGNOSIS — H353221 Exudative age-related macular degeneration, left eye, with active choroidal neovascularization: Secondary | ICD-10-CM | POA: Diagnosis not present

## 2017-04-08 DIAGNOSIS — H353213 Exudative age-related macular degeneration, right eye, with inactive scar: Secondary | ICD-10-CM | POA: Diagnosis not present

## 2017-04-21 ENCOUNTER — Ambulatory Visit (INDEPENDENT_AMBULATORY_CARE_PROVIDER_SITE_OTHER): Payer: Medicare Other | Admitting: Podiatry

## 2017-04-21 DIAGNOSIS — M79676 Pain in unspecified toe(s): Secondary | ICD-10-CM

## 2017-04-21 DIAGNOSIS — B351 Tinea unguium: Secondary | ICD-10-CM | POA: Diagnosis not present

## 2017-04-25 NOTE — Progress Notes (Signed)
   SUBJECTIVE Patient presents to office today complaining of elongated, thickened nails. Pain while ambulating in shoes. Patient is unable to trim their own nails.   Past Medical History:  Diagnosis Date  . Anemia   . Asthma   . Cellulitis   . Chronic acquired lymphedema   . Chronic venous insufficiency   . Dysrhythmia   . Hyperlipidemia   . Hypertension     OBJECTIVE General Patient is awake, alert, and oriented x 3 and in no acute distress. Derm Skin is dry and supple bilateral. Negative open lesions or macerations. Remaining integument unremarkable. Nails are tender, long, thickened and dystrophic with subungual debris, consistent with onychomycosis, 1-5 bilateral. No signs of infection noted. Vasc  DP and PT pedal pulses palpable bilaterally. Temperature gradient within normal limits.  Neuro Epicritic and protective threshold sensation diminished bilaterally.  Musculoskeletal Exam No symptomatic pedal deformities noted bilateral. Muscular strength within normal limits.  ASSESSMENT 1. Onychodystrophic nails 1-5 bilateral with hyperkeratosis of nails.  2. Onychomycosis of nail due to dermatophyte bilateral 3. Pain in foot bilateral  PLAN OF CARE 1. Patient evaluated today.  2. Instructed to maintain good pedal hygiene and foot care.  3. Mechanical debridement of nails 1-5 bilaterally performed using a nail nipper. Filed with dremel without incident.  4. Return to clinic in 3 mos.    Edrick Kins, DPM [facility]  Dr. Edrick Kins, [address]                                        [place], [place] [address]                Office [phone]  Fax [phone]

## 2017-05-07 DIAGNOSIS — Z23 Encounter for immunization: Secondary | ICD-10-CM | POA: Diagnosis not present

## 2017-05-07 DIAGNOSIS — Z6827 Body mass index (BMI) 27.0-27.9, adult: Secondary | ICD-10-CM | POA: Diagnosis not present

## 2017-05-07 DIAGNOSIS — H6123 Impacted cerumen, bilateral: Secondary | ICD-10-CM | POA: Diagnosis not present

## 2017-05-07 DIAGNOSIS — H9193 Unspecified hearing loss, bilateral: Secondary | ICD-10-CM | POA: Diagnosis not present

## 2017-06-03 DIAGNOSIS — H353221 Exudative age-related macular degeneration, left eye, with active choroidal neovascularization: Secondary | ICD-10-CM | POA: Diagnosis not present

## 2017-06-03 DIAGNOSIS — H353231 Exudative age-related macular degeneration, bilateral, with active choroidal neovascularization: Secondary | ICD-10-CM | POA: Diagnosis not present

## 2017-06-03 DIAGNOSIS — H35362 Drusen (degenerative) of macula, left eye: Secondary | ICD-10-CM | POA: Diagnosis not present

## 2017-06-25 DIAGNOSIS — M1711 Unilateral primary osteoarthritis, right knee: Secondary | ICD-10-CM | POA: Diagnosis not present

## 2017-07-09 DIAGNOSIS — H6123 Impacted cerumen, bilateral: Secondary | ICD-10-CM | POA: Diagnosis not present

## 2017-07-09 DIAGNOSIS — Z6827 Body mass index (BMI) 27.0-27.9, adult: Secondary | ICD-10-CM | POA: Diagnosis not present

## 2017-07-22 DIAGNOSIS — H353213 Exudative age-related macular degeneration, right eye, with inactive scar: Secondary | ICD-10-CM | POA: Diagnosis not present

## 2017-07-22 DIAGNOSIS — H353221 Exudative age-related macular degeneration, left eye, with active choroidal neovascularization: Secondary | ICD-10-CM | POA: Diagnosis not present

## 2017-07-28 ENCOUNTER — Encounter: Payer: Self-pay | Admitting: Podiatry

## 2017-07-28 ENCOUNTER — Ambulatory Visit: Payer: Medicare Other | Admitting: Podiatry

## 2017-07-28 ENCOUNTER — Ambulatory Visit (INDEPENDENT_AMBULATORY_CARE_PROVIDER_SITE_OTHER): Payer: Medicare Other | Admitting: Podiatry

## 2017-07-28 DIAGNOSIS — M79676 Pain in unspecified toe(s): Secondary | ICD-10-CM

## 2017-07-28 DIAGNOSIS — B351 Tinea unguium: Secondary | ICD-10-CM | POA: Diagnosis not present

## 2017-07-28 DIAGNOSIS — M79609 Pain in unspecified limb: Principal | ICD-10-CM

## 2017-07-29 NOTE — Progress Notes (Signed)
Subjective:   Patient ID: Lori [name], female   DOB: 84 y.o.   MRN: [id number]   HPI Patient presents with elongated nailbeds that are incurvated and sore 1-5 both feet   ROS      Objective:  Physical Exam  Thick yellow brittle nailbeds 1-5 both feet that are painful mycotic nail infection 1-5 both feet with pain     Assessment:  Mycotic nail infection with pain 1-5 both feet     Plan:  Debride painful nailbeds 1-5 both feet with no iatrogenic bleeding noted

## 2017-08-06 DIAGNOSIS — N183 Chronic kidney disease, stage 3 (moderate): Secondary | ICD-10-CM | POA: Diagnosis not present

## 2017-08-06 DIAGNOSIS — D508 Other iron deficiency anemias: Secondary | ICD-10-CM | POA: Diagnosis not present

## 2017-08-06 DIAGNOSIS — Z6828 Body mass index (BMI) 28.0-28.9, adult: Secondary | ICD-10-CM | POA: Diagnosis not present

## 2017-08-06 DIAGNOSIS — I10 Essential (primary) hypertension: Secondary | ICD-10-CM | POA: Diagnosis not present

## 2017-08-06 DIAGNOSIS — I480 Paroxysmal atrial fibrillation: Secondary | ICD-10-CM | POA: Diagnosis not present

## 2017-08-06 DIAGNOSIS — H3530 Unspecified macular degeneration: Secondary | ICD-10-CM | POA: Diagnosis not present

## 2017-08-06 DIAGNOSIS — Z1389 Encounter for screening for other disorder: Secondary | ICD-10-CM | POA: Diagnosis not present

## 2017-08-06 DIAGNOSIS — R5383 Other fatigue: Secondary | ICD-10-CM | POA: Diagnosis not present

## 2017-08-17 ENCOUNTER — Encounter (HOSPITAL_COMMUNITY): Payer: Self-pay

## 2017-08-17 ENCOUNTER — Other Ambulatory Visit: Payer: Self-pay

## 2017-08-17 ENCOUNTER — Inpatient Hospital Stay (HOSPITAL_COMMUNITY)
Admission: EM | Admit: 2017-08-17 | Discharge: 2017-08-22 | DRG: 603 | Disposition: A | Discharge status: Home Health | Payer: Medicare Other | Attending: Internal Medicine | Admitting: Internal Medicine

## 2017-08-17 DIAGNOSIS — L03119 Cellulitis of unspecified part of limb: Secondary | ICD-10-CM | POA: Diagnosis not present

## 2017-08-17 DIAGNOSIS — E669 Obesity, unspecified: Secondary | ICD-10-CM | POA: Diagnosis present

## 2017-08-17 DIAGNOSIS — L03115 Cellulitis of right lower limb: Principal | ICD-10-CM

## 2017-08-17 DIAGNOSIS — I129 Hypertensive chronic kidney disease with stage 1 through stage 4 chronic kidney disease, or unspecified chronic kidney disease: Secondary | ICD-10-CM | POA: Diagnosis present

## 2017-08-17 DIAGNOSIS — L03116 Cellulitis of left lower limb: Secondary | ICD-10-CM | POA: Diagnosis not present

## 2017-08-17 DIAGNOSIS — R609 Edema, unspecified: Secondary | ICD-10-CM | POA: Diagnosis not present

## 2017-08-17 DIAGNOSIS — E785 Hyperlipidemia, unspecified: Secondary | ICD-10-CM | POA: Diagnosis present

## 2017-08-17 DIAGNOSIS — I481 Persistent atrial fibrillation: Secondary | ICD-10-CM

## 2017-08-17 DIAGNOSIS — I10 Essential (primary) hypertension: Secondary | ICD-10-CM | POA: Diagnosis not present

## 2017-08-17 DIAGNOSIS — R600 Localized edema: Secondary | ICD-10-CM | POA: Diagnosis present

## 2017-08-17 DIAGNOSIS — I482 Chronic atrial fibrillation: Secondary | ICD-10-CM | POA: Diagnosis present

## 2017-08-17 DIAGNOSIS — Z96641 Presence of right artificial hip joint: Secondary | ICD-10-CM | POA: Diagnosis present

## 2017-08-17 DIAGNOSIS — H3530 Unspecified macular degeneration: Secondary | ICD-10-CM | POA: Diagnosis present

## 2017-08-17 DIAGNOSIS — E875 Hyperkalemia: Secondary | ICD-10-CM | POA: Diagnosis present

## 2017-08-17 DIAGNOSIS — Z96652 Presence of left artificial knee joint: Secondary | ICD-10-CM | POA: Diagnosis present

## 2017-08-17 DIAGNOSIS — N184 Chronic kidney disease, stage 4 (severe): Secondary | ICD-10-CM

## 2017-08-17 DIAGNOSIS — I872 Venous insufficiency (chronic) (peripheral): Secondary | ICD-10-CM | POA: Diagnosis present

## 2017-08-17 DIAGNOSIS — I4891 Unspecified atrial fibrillation: Secondary | ICD-10-CM | POA: Diagnosis present

## 2017-08-17 DIAGNOSIS — Z6829 Body mass index (BMI) 29.0-29.9, adult: Secondary | ICD-10-CM

## 2017-08-17 DIAGNOSIS — M7989 Other specified soft tissue disorders: Secondary | ICD-10-CM | POA: Diagnosis not present

## 2017-08-17 DIAGNOSIS — D509 Iron deficiency anemia, unspecified: Secondary | ICD-10-CM | POA: Diagnosis present

## 2017-08-17 DIAGNOSIS — Z87891 Personal history of nicotine dependence: Secondary | ICD-10-CM

## 2017-08-17 LAB — CBC WITH DIFFERENTIAL/PLATELET
BASOS ABS: 0.1 10*3/uL (ref 0.0–0.1)
Basophils Relative: 1 %
Eosinophils Absolute: 0.3 10*3/uL (ref 0.0–0.7)
Eosinophils Relative: 4 %
HEMATOCRIT: 36.6 % (ref 36.0–46.0)
Hemoglobin: 11 g/dL — ABNORMAL LOW (ref 12.0–15.0)
LYMPHS ABS: 1 10*3/uL (ref 0.7–4.0)
Lymphocytes Relative: 14 %
MCH: 20.1 pg — ABNORMAL LOW (ref 26.0–34.0)
MCHC: 30.1 g/dL (ref 30.0–36.0)
MCV: 67 fL — ABNORMAL LOW (ref 78.0–100.0)
MONO ABS: 1 10*3/uL (ref 0.1–1.0)
MONOS PCT: 14 %
NEUTROS ABS: 4.9 10*3/uL (ref 1.7–7.7)
Neutrophils Relative %: 67 %
Platelets: 245 10*3/uL (ref 150–400)
RBC: 5.46 MIL/uL — ABNORMAL HIGH (ref 3.87–5.11)
RDW: 16.4 % — AB (ref 11.5–15.5)
WBC: 7.3 10*3/uL (ref 4.0–10.5)

## 2017-08-17 LAB — URINALYSIS, ROUTINE W REFLEX MICROSCOPIC
BILIRUBIN URINE: NEGATIVE
Glucose, UA: NEGATIVE mg/dL
HGB URINE DIPSTICK: NEGATIVE
Ketones, ur: NEGATIVE mg/dL
Leukocytes, UA: NEGATIVE
Nitrite: NEGATIVE
PH: 6 (ref 5.0–8.0)
Protein, ur: NEGATIVE mg/dL
SPECIFIC GRAVITY, URINE: 1.009 (ref 1.005–1.030)

## 2017-08-17 LAB — BASIC METABOLIC PANEL
Anion gap: 11 (ref 5–15)
BUN: 43 mg/dL — AB (ref 6–20)
CALCIUM: 9.1 mg/dL (ref 8.9–10.3)
CO2: 25 mmol/L (ref 22–32)
CREATININE: 1.8 mg/dL — AB (ref 0.44–1.00)
Chloride: 102 mmol/L (ref 101–111)
GFR calc Af Amer: 26 mL/min — ABNORMAL LOW (ref >60)
GFR, EST NON AFRICAN AMERICAN: 23 mL/min — AB (ref >60)
GLUCOSE: 91 mg/dL (ref 65–99)
POTASSIUM: 5.8 mmol/L — AB (ref 3.5–5.1)
Sodium: 138 mmol/L (ref 135–145)

## 2017-08-17 LAB — I-STAT CG4 LACTIC ACID, ED: Lactic Acid, Venous: 1.26 mmol/L (ref 0.5–1.9)

## 2017-08-17 MED ORDER — SODIUM CHLORIDE 0.9 % IV SOLN
1.0000 g | Freq: Once | INTRAVENOUS | Status: AC
  Administered 2017-08-17: 1 g via INTRAVENOUS
  Filled 2017-08-17: qty 10

## 2017-08-17 MED ORDER — CEFAZOLIN SODIUM-DEXTROSE 1-4 GM/50ML-% IV SOLN
1.0000 g | Freq: Two times a day (BID) | INTRAVENOUS | Status: DC
  Administered 2017-08-17 – 2017-08-19 (×5): 1 g via INTRAVENOUS
  Filled 2017-08-17 (×5): qty 50

## 2017-08-17 MED ORDER — DORZOLAMIDE HCL-TIMOLOL MAL 2-0.5 % OP SOLN
1.0000 [drp] | Freq: Two times a day (BID) | OPHTHALMIC | Status: DC
  Administered 2017-08-17 – 2017-08-22 (×10): 1 [drp] via OPHTHALMIC
  Filled 2017-08-17: qty 10

## 2017-08-17 MED ORDER — CEFAZOLIN SODIUM-DEXTROSE 1-4 GM/50ML-% IV SOLN
1.0000 g | Freq: Once | INTRAVENOUS | Status: AC
  Administered 2017-08-17: 1 g via INTRAVENOUS
  Filled 2017-08-17: qty 50

## 2017-08-17 MED ORDER — PANTOPRAZOLE SODIUM 40 MG PO TBEC
40.0000 mg | DELAYED_RELEASE_TABLET | Freq: Every day | ORAL | Status: DC
  Administered 2017-08-18 – 2017-08-22 (×5): 40 mg via ORAL
  Filled 2017-08-17 (×5): qty 1

## 2017-08-17 MED ORDER — TRAMADOL HCL 50 MG PO TABS: 50.0000 mg | ORAL_TABLET | Freq: Every day | ORAL | Status: DC | PRN

## 2017-08-17 MED ORDER — SODIUM CHLORIDE 0.9 % IV SOLN
INTRAVENOUS | Status: DC
  Administered 2017-08-17: 13:00:00 via INTRAVENOUS

## 2017-08-17 MED ORDER — ACETAMINOPHEN 650 MG RE SUPP
650.0000 mg | Freq: Four times a day (QID) | RECTAL | Status: DC | PRN
Start: 2017-08-17 — End: 2017-08-22

## 2017-08-17 MED ORDER — LORATADINE 10 MG PO TABS
10.0000 mg | ORAL_TABLET | Freq: Every day | ORAL | Status: DC
  Administered 2017-08-17 – 2017-08-21 (×5): 10 mg via ORAL
  Filled 2017-08-17 (×5): qty 1

## 2017-08-17 MED ORDER — DABIGATRAN ETEXILATE MESYLATE 75 MG PO CAPS
75.0000 mg | ORAL_CAPSULE | Freq: Two times a day (BID) | ORAL | Status: DC
  Administered 2017-08-17 – 2017-08-22 (×10): 75 mg via ORAL
  Filled 2017-08-17 (×10): qty 1

## 2017-08-17 MED ORDER — METOPROLOL TARTRATE 25 MG PO TABS
25.0000 mg | ORAL_TABLET | Freq: Two times a day (BID) | ORAL | Status: DC
  Administered 2017-08-17 – 2017-08-22 (×8): 25 mg via ORAL
  Filled 2017-08-17 (×9): qty 1

## 2017-08-17 MED ORDER — FUROSEMIDE 10 MG/ML IJ SOLN
40.0000 mg | Freq: Once | INTRAMUSCULAR | Status: AC
  Administered 2017-08-17: 40 mg via INTRAVENOUS
  Filled 2017-08-17: qty 4

## 2017-08-17 MED ORDER — VANCOMYCIN HCL IN DEXTROSE 1-5 GM/200ML-% IV SOLN
1000.0000 mg | Freq: Once | INTRAVENOUS | Status: AC
  Administered 2017-08-17: 1000 mg via INTRAVENOUS
  Filled 2017-08-17: qty 200

## 2017-08-17 MED ORDER — ALBUTEROL SULFATE (2.5 MG/3ML) 0.083% IN NEBU: 2.5000 mg | INHALATION_SOLUTION | Freq: Four times a day (QID) | RESPIRATORY_TRACT | Status: DC | PRN

## 2017-08-17 MED ORDER — SODIUM POLYSTYRENE SULFONATE PO POWD
45.0000 g | Freq: Once | ORAL | Status: DC
  Filled 2017-08-17 (×2): qty 45

## 2017-08-17 MED ORDER — ACETAMINOPHEN 325 MG PO TABS
650.0000 mg | ORAL_TABLET | Freq: Four times a day (QID) | ORAL | Status: DC | PRN
Start: 2017-08-17 — End: 2017-08-22
  Administered 2017-08-18: 650 mg via ORAL
  Filled 2017-08-17: qty 2

## 2017-08-17 MED ORDER — LATANOPROST 0.005 % OP SOLN
1.0000 [drp] | Freq: Every day | OPHTHALMIC | Status: DC
  Administered 2017-08-17 – 2017-08-21 (×5): 1 [drp] via OPHTHALMIC
  Filled 2017-08-17: qty 2.5

## 2017-08-17 NOTE — ED Provider Notes (Signed)
[facility] DEPT Provider Note   CSN: [id number] Arrival date & time: 08/17/17  1146     History   Chief Complaint Chief Complaint  Patient presents with  . Leg Swelling    HPI Lori [name] is a 84 y.o. female.  Pt presents to the ED today with redness and swelling to both lower legs.  She has had cellulitis in the past and thinks she has it again.  Pt noticed sx on Friday the 8th.  Redness just in feet.  She started clindamycin yesterday.  The pt said the redness is worsening.       Past Medical History:  Diagnosis Date  . Anemia   . Asthma   . Cellulitis   . Chronic acquired lymphedema   . Chronic venous insufficiency   . Dysrhythmia   . Hyperlipidemia   . Hypertension     Patient Active Problem List   Diagnosis Date Noted  . Pressure injury of skin 11/06/2016  . Moderate mitral regurgitation 11/04/2016  . Elevated brain natriuretic peptide (BNP) level 11/04/2016  . Pulmonary HTN ([place]) 11/04/2016  . Acute kidney injury ([place]) 11/04/2016  . Chest pain 11/04/2016  . HLD (hyperlipidemia) 11/04/2016  . Acute systolic heart failure ([place]) 11/04/2016  . Acute exacerbation of CHF (congestive heart failure) ([place]) 11/04/2016  . Atypical chest pain   . Acute diastolic CHF (congestive heart failure) ([place])   . Lobar pneumonia ([place])   . Hyponatremia 07/13/2012    Class: Acute  . Essential hypertension 03/31/2012  . Cellulitis of leg 11/11/2011    Class: Acute  . Atrial fibrillation ([place]) 07/29/2011  . Peripheral edema 07/29/2011  . Cough 06/26/2010  . ANEMIA, IRON DEFICIENCY 06/10/2010  . ANEMIA-UNSPECIFIED 06/10/2010  . ASTHMA UNSPECIFIED 06/10/2010  . PERSONAL HX COLONIC POLYPS 06/10/2010    Past Surgical History:  Procedure Laterality Date  . JOINT REPLACEMENT    . left total knee replacement    . right total hip replacement      OB History    No data available       Home Medications    Prior to Admission  medications   Medication Sig Start Date End Date Taking? Authorizing Provider  albuterol (PROVENTIL HFA) 108 (90 Base) MCG/ACT inhaler Inhale 2 puffs into the lungs every 6 (six) hours as needed for wheezing or shortness of breath.   Yes [provider]  clindamycin (CLEOCIN) 300 MG capsule Take 300 mg by mouth 3 (three) times daily. 08/15/17  Yes [provider]  dorzolamide-timolol (COSOPT) 22.3-6.8 MG/ML ophthalmic solution INSTILL 1 DROP IN THE LEFT EYE TWICE A DAY 11/25/16  Yes [provider]  furosemide (LASIX) 20 MG tablet Take 40mg  PO daily, take an extra 20mg  Pill if your weight is over 2 pounds in 24hrs or your notice extra fluid in your legs. Patient taking differently: Take 20-40 mg by mouth See admin instructions. Take 40mg  PO daily, take an extra 20mg  Pill if your weight is over 2 pounds in 24hrs or your notice extra fluid in your legs. 11/07/16  Yes Thurnell Lose, MD  loratadine (CLARITIN) 10 MG tablet Take 10 mg by mouth at bedtime.   Yes [provider]  metoprolol tartrate (LOPRESSOR) 25 MG tablet Take 1 tablet (25 mg total) by mouth 2 (two) times daily. 11/27/16  Yes Evans Lance, MD  omeprazole (PRILOSEC) 40 MG capsule Take 40 mg by mouth 2 (two) times daily.    Yes [provider]  PRADAXA 75 MG CAPS capsule TAKE 1 CAPSULE EVERY 12 HOURS 03/25/17  Yes Evans Lance, MD  Probiotic Product (ALIGN PO) Take 1 capsule by mouth 2 (two) times daily.    Yes [provider]  spironolactone (ALDACTONE) 25 MG tablet Take 1 tablet (25 mg total) by mouth 3 (three) times a week. Monday Wednesday Friday Patient taking differently: Take 50 mg by mouth every Monday, Wednesday, and Friday.  05/30/14  Yes Evans Lance, MD  traMADol (ULTRAM) 50 MG tablet Take 50 mg by mouth daily as needed for moderate pain or severe pain.  04/12/14  Yes [provider]  Travoprost, BAK Free, (TRAVATAN Z) 0.004 % SOLN ophthalmic solution INSTILL 1  DROP INTO THE LEFT EYE NIGHTLY 11/25/16  Yes [provider]  vitamin C (ASCORBIC ACID) 500 MG tablet Take 500 mg by mouth daily.    Yes [provider]  azithromycin (ZITHROMAX) 250 MG tablet Take 1 tablet (250 mg total) by mouth daily. Patient not taking: Reported on 08/17/2017 11/07/16   Thurnell Lose, MD  diltiazem (CARDIZEM CD) 120 MG 24 hr capsule Take 1 capsule (120 mg total) by mouth daily. Patient not taking: Reported on 08/17/2017 11/08/16   Thurnell Lose, MD  triamterene-hydrochlorothiazide (MAXZIDE-25) 37.5-25 MG per tablet Take 1 tablet by mouth daily.  09/29/11  [provider]    Family History Family History  Problem Relation Age of Onset  . Lung cancer Mother   . Other Father        unknown causes  . Breast cancer Neg Hx   . Diabetes Neg Hx     Social History Social History   Tobacco Use  . Smoking status: Former Smoker    Last attempt to quit: 07/28/1959    Years since quitting: 58.0  . Smokeless tobacco: Never Used  Substance Use Topics  . Alcohol use: No  . Drug use: No     Allergies   Dust mite extract; Mold extract [trichophyton mentagrophyte]; Latex; and Tape   Review of Systems Review of Systems  Skin: Positive for rash.  All other systems reviewed and are negative.    Physical Exam Updated Vital Signs BP 130/74   Pulse 92   Temp 98.1 F (36.7 C) (Oral)   Resp 16   Ht 5\' 4"  (1.626 m)   Wt 78.9 kg (174 lb)   SpO2 96%   BMI 29.87 kg/m   Physical Exam  Constitutional: She is oriented to person, place, and time. She appears well-developed and well-nourished.  HENT:  Head: Normocephalic and atraumatic.  Right Ear: External ear normal.  Left Ear: External ear normal.  Nose: Nose normal.  Mouth/Throat: Oropharynx is clear and moist.  Eyes: Conjunctivae and EOM are normal. Pupils are equal, round, and reactive to light.  Neck: Normal range of motion. Neck supple.  Cardiovascular: Normal rate, regular rhythm,  normal heart sounds and intact distal pulses.  Pulmonary/Chest: Effort normal and breath sounds normal.  Abdominal: Soft. Bowel sounds are normal.  Musculoskeletal: Normal range of motion.  Neurological: She is alert and oriented to person, place, and time.  Skin: Capillary refill takes less than 2 seconds.  Bilateral cellulitis lower extremities  Psychiatric: She has a normal mood and affect. Her behavior is normal. Judgment and thought content normal.  Nursing note and vitals reviewed.    ED Treatments / Results  Labs (all labs ordered are listed, but only abnormal results are displayed) Labs Reviewed  BASIC METABOLIC PANEL - Abnormal; Notable for the following components:      Result Value   Potassium 5.8 (*)    BUN 43 (*)    Creatinine, Ser 1.80 (*)    GFR calc non Af Amer 23 (*)    GFR calc Af Amer 26 (*)    All other components within normal limits  CBC WITH DIFFERENTIAL/PLATELET - Abnormal; Notable for the following components:   RBC 5.46 (*)    Hemoglobin 11.0 (*)    MCV 67.0 (*)    MCH 20.1 (*)    RDW 16.4 (*)    All other components within normal limits  CULTURE, BLOOD (ROUTINE X 2)  URINE CULTURE  URINALYSIS, ROUTINE W REFLEX MICROSCOPIC  I-STAT CG4 LACTIC ACID, ED    EKG  EKG Interpretation None       Radiology No results found.  Procedures Procedures (including critical care time)  Medications Ordered in ED Medications  0.9 %  sodium chloride infusion ( Intravenous New Bag/Given 08/17/17 1315)  vancomycin (VANCOCIN) IVPB 1000 mg/200 mL premix (1,000 mg Intravenous New Bag/Given 08/17/17 1406)  ceFAZolin (ANCEF) IVPB 1 g/50 mL premix (0 g Intravenous Stopped 08/17/17 1416)     Initial Impression / Assessment and Plan / ED Course  I have reviewed the triage vital signs and the nursing notes.  Pertinent labs & imaging results that were available during my care of the patient were reviewed by me and considered in my medical decision making (see chart  for details).   Pt has been on outpatient therapy with worsening of sx.  Pt d/w Dr. Bonner Puna ([facility]) for admission.  Final Clinical Impressions(s) / ED Diagnoses   Final diagnoses:  Cellulitis of right lower extremity  Cellulitis of left lower extremity  Hyperkalemia  CKD (chronic kidney disease) stage 4, GFR 15-29 ml/min [facility])    ED Discharge Orders    None       Isla Pence, MD 08/17/17 1443

## 2017-08-17 NOTE — ED Notes (Signed)
Hospitalist at the bedside 

## 2017-08-17 NOTE — ED Triage Notes (Signed)
Patient c/o bilateral leg swelling and redness x 4 days.

## 2017-08-17 NOTE — Progress Notes (Signed)
Pharmacy Antibiotic Note  Lori [name] is a 84 y.o. female admitted on 08/17/2017 with LE cellulitis that has worsened despite outpatient treatment with Clindamycin.  No purulence noted by MD.  Pharmacy has been consulted for Ancef dosing. 08/17/2017:  Afebrile  No leukocytosis or elevated LA  Scr slightly elevated from baseline, est CrCl ~18 ml/min  Plan:  Ancef 1g IV q12h  Monitor renal function and cx data   Height: 5\' 4"  (162.6 cm) Weight: 174 lb (78.9 kg) IBW/kg (Calculated) : 54.7  Temp (24hrs), Avg:98.1 F (36.7 C), Min:98.1 F (36.7 C), Max:98.1 F (36.7 C)  Recent Labs  Lab 08/17/17 1243 08/17/17 1300  WBC 7.3  --   CREATININE 1.80*  --   LATICACIDVEN  --  1.26    Estimated Creatinine Clearance: 18.6 mL/min (A) (by C-G formula based on SCr of 1.8 mg/dL (H)).    Allergies  Allergen Reactions  . Dust Mite Extract Shortness Of Breath and Itching  . Mold Extract [Trichophyton Mentagrophyte] Shortness Of Breath, Itching and Cough    VERY ALLERGIC/EXPOSURE RESULTS IN TREMENDOUS COUGHING (just one symptom)  . Latex Itching    And certain fabrics  . Tape Other (See Comments)    SKIN IS VERY THIN AND WILL TEAR AND BRUISE EASILY!! PLEASE USE COBAN WRAP-    Antimicrobials this admission: 2/12 Ancef >>  2/12 Vanc x1 in ED  Dose adjustments this admission:  Microbiology results: 2/12 BCx:  2/12 UCx:    Thank you for allowing pharmacy to be a part of this patient's care.  Biagio Borg 08/17/2017 4:13 PM

## 2017-08-17 NOTE — H&P (Addendum)
History and Physical   Lori [name]:[id number] DOB: Jan 19, 1919 DOA: 08/17/2017  Referring MD/NP/PA: Gilford Raid, MD, [place] PCP: Leanna Battles, MD  Patient coming from: Home  Chief Complaint: Leg swelling and redness  HPI: Lori [name] is a 84 y.o. female with a history of chronic lower extremity edema and recurrent cellulitis, chronic AFib on pradaxa, HTN, iron-deficiency anemia who prsented to the ED with worsening swelling and redness in both lower legs. She reports her leg swelling had been significantly improved for a while when she noticed increased swelling below the knee and increasingly bright erythema on the lower legs 2/8. This has continued constantly since that time despite elevating her legs which has helped in the past, causing her to start taking clindamycin 2/10. With worsening symptoms despite clindamycin she presented to the ED. On arrival she was afebrile in no distress, WBC normal at 7.3, lactic acid normal at 1.26. Blood cultures sent. Urinalysis was negative. Ancef and vancomycin were given and hospitalists called to admit for bilateral lower extremity cellulitis. Since arrival she states her left leg swelling is improving.  Review of Systems: No fever, chills, dyspnea, chest pain, palpitations, abd pain, N/V/D, myalgias, arthralgias. She has diffuse dry itchy skin unchanged, and per HPI. All others reviewed and are negative.   Past Medical History:  Diagnosis Date  . Anemia   . Asthma   . Cellulitis   . Chronic acquired lymphedema   . Chronic venous insufficiency   . Dysrhythmia   . Hyperlipidemia   . Hypertension    Past Surgical History:  Procedure Laterality Date  . JOINT REPLACEMENT    . left total knee replacement    . right total hip replacement     - Remote smoker. Lives independently, gets around with a walker. No EtOH or illicit drugs.  reports that she quit smoking about 58 years ago. she has never used smokeless tobacco. She reports that she  does not drink alcohol or use drugs. Allergies  Allergen Reactions  . Dust Mite Extract Shortness Of Breath and Itching  . Mold Extract [Trichophyton Mentagrophyte] Shortness Of Breath, Itching and Cough    VERY ALLERGIC/EXPOSURE RESULTS IN TREMENDOUS COUGHING (just one symptom)  . Latex Itching    And certain fabrics  . Tape Other (See Comments)    SKIN IS VERY THIN AND WILL TEAR AND BRUISE EASILY!! PLEASE USE COBAN WRAP-   Family History  Problem Relation Age of Onset  . Lung cancer Mother   . Other Father        unknown causes  . Breast cancer Neg Hx   . Diabetes Neg Hx    - Family history otherwise reviewed and not pertinent.  Prior to Admission medications   Medication Sig Start Date End Date Taking? Authorizing Provider  albuterol (PROVENTIL HFA) 108 (90 Base) MCG/ACT inhaler Inhale 2 puffs into the lungs every 6 (six) hours as needed for wheezing or shortness of breath.   Yes [provider]  clindamycin (CLEOCIN) 300 MG capsule Take 300 mg by mouth 3 (three) times daily. 08/15/17  Yes [provider]  dorzolamide-timolol (COSOPT) 22.3-6.8 MG/ML ophthalmic solution INSTILL 1 DROP IN THE LEFT EYE TWICE A DAY 11/25/16  Yes [provider]  furosemide (LASIX) 20 MG tablet Take 40mg  PO daily, take an extra 20mg  Pill if your weight is over 2 pounds in 24hrs or your notice extra fluid in your legs. Patient taking differently: Take 20-40 mg by mouth See admin instructions. Take  40mg  PO daily, take an extra 20mg  Pill if your weight is over 2 pounds in 24hrs or your notice extra fluid in your legs. 11/07/16  Yes Thurnell Lose, MD  loratadine (CLARITIN) 10 MG tablet Take 10 mg by mouth at bedtime.   Yes [provider]  metoprolol tartrate (LOPRESSOR) 25 MG tablet Take 1 tablet (25 mg total) by mouth 2 (two) times daily. 11/27/16  Yes Evans Lance, MD  omeprazole (PRILOSEC) 40 MG capsule Take 40 mg by mouth 2 (two) times daily.    Yes [provider]  PRADAXA 75 MG CAPS capsule TAKE 1 CAPSULE EVERY 12 HOURS 03/25/17  Yes Evans Lance, MD  Probiotic Product (ALIGN PO) Take 1 capsule by mouth 2 (two) times daily.    Yes [provider]  spironolactone (ALDACTONE) 25 MG tablet Take 1 tablet (25 mg total) by mouth 3 (three) times a week. Monday Wednesday Friday Patient taking differently: Take 50 mg by mouth every Monday, Wednesday, and Friday.  05/30/14  Yes Evans Lance, MD  traMADol (ULTRAM) 50 MG tablet Take 50 mg by mouth daily as needed for moderate pain or severe pain.  04/12/14  Yes [provider]  Travoprost, BAK Free, (TRAVATAN Z) 0.004 % SOLN ophthalmic solution INSTILL 1 DROP INTO THE LEFT EYE NIGHTLY 11/25/16  Yes [provider]  vitamin C (ASCORBIC ACID) 500 MG tablet Take 500 mg by mouth daily.    Yes [provider]  azithromycin (ZITHROMAX) 250 MG tablet Take 1 tablet (250 mg total) by mouth daily. Patient not taking: Reported on 08/17/2017 11/07/16   Thurnell Lose, MD  diltiazem (CARDIZEM CD) 120 MG 24 hr capsule Take 1 capsule (120 mg total) by mouth daily. Patient not taking: Reported on 08/17/2017 11/08/16   Thurnell Lose, MD  triamterene-hydrochlorothiazide (MAXZIDE-25) 37.5-25 MG per tablet Take 1 tablet by mouth daily.  09/29/11  [provider]    Physical Exam: Vitals:   08/17/17 1204 08/17/17 1207 08/17/17 1312 08/17/17 1523  BP: (!) 111/97  130/74 132/68  Pulse: 78  92 73  Resp: 16  16 16   Temp: 98.1 F (36.7 C)     TempSrc: Oral     SpO2: 98%  96% 96%  Weight:  78.9 kg (174 lb)    Height:  5\' 4"  (1.626 m)     Constitutional: Pleasant, elderly female in no distress, calm demeanor Eyes: Lids and conjunctivae normal, PERRL ENMT: Mucous membranes are moist. Posterior pharynx clear of any exudate or lesions. Normal dentition.  Neck: normal, supple, no masses, no thyromegaly Respiratory: Non-labored breathing room air without accessory muscle use.  Clear breath sounds to auscultation bilaterally Cardiovascular: Irreg, rate in 70's, no murmurs, rubs, or gallops. No carotid bruits. No JVD. 2+ pedal pulses. Abdomen: Normoactive bowel sounds. No tenderness, non-distended, and no masses palpated. No hepatosplenomegaly. GU: No indwelling catheter. External catheter Musculoskeletal: No clubbing / cyanosis. No joint deformity upper and lower extremities. Good ROM, no contractures. Normal muscle tone.  Skin: Warm, dry. Bilateral lower extremities with ill-defined, warm, bright erythema on the lower legs, 2+ pitting edema below the knee bilaterally. This is only very mildly tender. Significant varicosities bilaterally. No fluctuance. Neurologic: CN II-XII grossly intact. Gait not assessed. Speech normal. No focal deficits in motor strength or sensation in all extremities.  Psychiatric: Alert and oriented x3. Normal judgment and insight. Mood euthymic with congruent, pleasant affect.   CBC: Recent Labs  Lab 08/17/17 1243  WBC 7.3  NEUTROABS 4.9  HGB 11.0*  HCT 36.6  MCV 67.0*  PLT 161   Basic Metabolic Panel: Recent Labs  Lab 08/17/17 1243  NA 138  K 5.8*  CL 102  CO2 25  GLUCOSE 91  BUN 43*  CREATININE 1.80*  CALCIUM 9.1   Urine analysis:    Component Value Date/Time   COLORURINE YELLOW 08/17/2017 [place] 08/17/2017 1416   LABSPEC 1.009 08/17/2017 1416   PHURINE 6.0 08/17/2017 1416   GLUCOSEU NEGATIVE 08/17/2017 1416   HGBUR NEGATIVE 08/17/2017 1416   BILIRUBINUR NEGATIVE 08/17/2017 1416   KETONESUR NEGATIVE 08/17/2017 1416   PROTEINUR NEGATIVE 08/17/2017 1416   UROBILINOGEN 0.2 07/10/2012 1122   NITRITE NEGATIVE 08/17/2017 1416   LEUKOCYTESUR NEGATIVE 08/17/2017 1416   EKG: Ordered  Assessment/Plan Principal Problem:   Lower extremity cellulitis Active Problems:   ANEMIA, IRON DEFICIENCY   Atrial fibrillation (HCC)   Peripheral edema   Essential hypertension   Chronic venous insufficiency    Hyperkalemia   CKD (chronic kidney disease), stage IV (HCC)   Lower extremity swelling and erythema: Possibly cellulitis, though very possibly simply related to venous dermatitis with worsening swelling in lower extremities. No fever, leukocytosis, lactic acid elevation, or other objective evidence of systemic infection.  - Will continue ancef for nonpurulent cellulitis. She failed outpatient management with clindamycin. - Blood cultures were sent - TED hose, elevate extremities - Rule out DVT w/U/S bilaterally.   Hyperkalemia: Acute, mild. Possibly related to spironolactone which will be held. - Kayexalate, calcium gluconate, check ECG.  - Will give 1 dose of lasix  Stage IV CKD: Cr 1.80 on admission, with baseline rising over the past year, most recently 1.5 - 1.7.  - Will stop IVF's given her lower extremity swelling, one dose of lasix, hold spironolactone.  - Recommend nephrology follow up.  AFib: Rate-controlled - Continue home metoprolol and pradaxa.   OA s/p right THA, left TKA.  - Continue home tramadol  Left ARMD:  - Continue gtt's  Chronic anemia: At baseline. Not on home iron.  - Monitor  DVT prophylaxis: Pradaxa  Code Status: Full code (previously was DNR but would like to be resuscitated during hospitalization)  Family Communication: None at bedside Disposition Plan: Uncertain Consults called: None  Admission status: Observation    Vance Gather, MD [facility] [email or web address] Password [facility] 08/17/2017, 4:27 PM

## 2017-08-17 NOTE — Plan of Care (Signed)
  Progressing Education: Knowledge of General Education information will improve 08/17/2017 1940 - Progressing by Milderd Meager, RN Health Behavior/Discharge Planning: Ability to manage health-related needs will improve 08/17/2017 1940 - Progressing by Milderd Meager, RN Clinical Measurements: Ability to maintain clinical measurements within normal limits will improve 08/17/2017 1940 - Progressing by Milderd Meager, RN Will remain free from infection 08/17/2017 1940 - Progressing by Milderd Meager, RN Diagnostic test results will improve 08/17/2017 1940 - Progressing by Milderd Meager, RN Respiratory complications will improve 08/17/2017 1940 - Progressing by Milderd Meager, RN Cardiovascular complication will be avoided 08/17/2017 1940 - Progressing by Milderd Meager, RN Activity: Risk for activity intolerance will decrease 08/17/2017 1940 - Progressing by Milderd Meager, RN Nutrition: Adequate nutrition will be maintained 08/17/2017 1940 - Progressing by Milderd Meager, RN Coping: Level of anxiety will decrease 08/17/2017 1940 - Progressing by Milderd Meager, RN Elimination: Will not experience complications related to bowel motility 08/17/2017 1940 - Progressing by Milderd Meager, RN Will not experience complications related to urinary retention 08/17/2017 1940 - Progressing by Milderd Meager, RN Pain Managment: General experience of comfort will improve 08/17/2017 1940 - Progressing by Milderd Meager, RN Safety: Ability to remain free from injury will improve 08/17/2017 1940 - Progressing by Milderd Meager, RN Skin Integrity: Risk for impaired skin integrity will decrease 08/17/2017 1940 - Progressing by Milderd Meager, RN Clinical Measurements: Ability to avoid or minimize complications of infection will improve 08/17/2017 1940 - Progressing by Milderd Meager, RN Skin Integrity: Skin integrity will improve 08/17/2017  1940 - Progressing by Milderd Meager, RN   Progressing Education: Knowledge of General Education information will improve 08/17/2017 1940 - Progressing by Milderd Meager, RN Health Behavior/Discharge Planning: Ability to manage health-related needs will improve 08/17/2017 1940 - Progressing by Milderd Meager, RN Clinical Measurements: Ability to maintain clinical measurements within normal limits will improve 08/17/2017 1940 - Progressing by Milderd Meager, RN Will remain free from infection 08/17/2017 1940 - Progressing by Milderd Meager, RN Diagnostic test results will improve 08/17/2017 1940 - Progressing by Milderd Meager, RN Respiratory complications will improve 08/17/2017 1940 - Progressing by Milderd Meager, RN Cardiovascular complication will be avoided 08/17/2017 1940 - Progressing by Milderd Meager, RN Activity: Risk for activity intolerance will decrease 08/17/2017 1940 - Progressing by Milderd Meager, RN Nutrition: Adequate nutrition will be maintained 08/17/2017 1940 - Progressing by Milderd Meager, RN Coping: Level of anxiety will decrease 08/17/2017 1940 - Progressing by Milderd Meager, RN Elimination: Will not experience complications related to bowel motility 08/17/2017 1940 - Progressing by Milderd Meager, RN Will not experience complications related to urinary retention 08/17/2017 1940 - Progressing by Milderd Meager, RN Pain Managment: General experience of comfort will improve 08/17/2017 1940 - Progressing by Milderd Meager, RN Safety: Ability to remain free from injury will improve 08/17/2017 1940 - Progressing by Milderd Meager, RN Skin Integrity: Risk for impaired skin integrity will decrease 08/17/2017 1940 - Progressing by Milderd Meager, RN Clinical Measurements: Ability to avoid or minimize complications of infection will improve 08/17/2017 1940 - Progressing by Milderd Meager, RN Skin  Integrity: Skin integrity will improve 08/17/2017 1940 - Progressing by Milderd Meager, RN

## 2017-08-18 ENCOUNTER — Observation Stay (HOSPITAL_COMMUNITY): Payer: Medicare Other

## 2017-08-18 DIAGNOSIS — H3530 Unspecified macular degeneration: Secondary | ICD-10-CM | POA: Diagnosis present

## 2017-08-18 DIAGNOSIS — Z96641 Presence of right artificial hip joint: Secondary | ICD-10-CM | POA: Diagnosis present

## 2017-08-18 DIAGNOSIS — E669 Obesity, unspecified: Secondary | ICD-10-CM | POA: Diagnosis present

## 2017-08-18 DIAGNOSIS — R609 Edema, unspecified: Secondary | ICD-10-CM | POA: Diagnosis not present

## 2017-08-18 DIAGNOSIS — N184 Chronic kidney disease, stage 4 (severe): Secondary | ICD-10-CM | POA: Diagnosis present

## 2017-08-18 DIAGNOSIS — Z87891 Personal history of nicotine dependence: Secondary | ICD-10-CM | POA: Diagnosis not present

## 2017-08-18 DIAGNOSIS — I129 Hypertensive chronic kidney disease with stage 1 through stage 4 chronic kidney disease, or unspecified chronic kidney disease: Secondary | ICD-10-CM | POA: Diagnosis present

## 2017-08-18 DIAGNOSIS — E875 Hyperkalemia: Secondary | ICD-10-CM | POA: Diagnosis present

## 2017-08-18 DIAGNOSIS — Z96652 Presence of left artificial knee joint: Secondary | ICD-10-CM | POA: Diagnosis present

## 2017-08-18 DIAGNOSIS — M7989 Other specified soft tissue disorders: Secondary | ICD-10-CM | POA: Diagnosis not present

## 2017-08-18 DIAGNOSIS — E785 Hyperlipidemia, unspecified: Secondary | ICD-10-CM | POA: Diagnosis present

## 2017-08-18 DIAGNOSIS — L03116 Cellulitis of left lower limb: Secondary | ICD-10-CM | POA: Diagnosis present

## 2017-08-18 DIAGNOSIS — D509 Iron deficiency anemia, unspecified: Secondary | ICD-10-CM | POA: Diagnosis present

## 2017-08-18 DIAGNOSIS — Z6829 Body mass index (BMI) 29.0-29.9, adult: Secondary | ICD-10-CM | POA: Diagnosis not present

## 2017-08-18 DIAGNOSIS — L03115 Cellulitis of right lower limb: Secondary | ICD-10-CM | POA: Diagnosis present

## 2017-08-18 DIAGNOSIS — I482 Chronic atrial fibrillation: Secondary | ICD-10-CM | POA: Diagnosis present

## 2017-08-18 DIAGNOSIS — I872 Venous insufficiency (chronic) (peripheral): Secondary | ICD-10-CM | POA: Diagnosis present

## 2017-08-18 DIAGNOSIS — L03119 Cellulitis of unspecified part of limb: Secondary | ICD-10-CM | POA: Diagnosis not present

## 2017-08-18 LAB — BASIC METABOLIC PANEL
Anion gap: 12 (ref 5–15)
BUN: 37 mg/dL — ABNORMAL HIGH (ref 6–20)
CALCIUM: 9.2 mg/dL (ref 8.9–10.3)
CHLORIDE: 102 mmol/L (ref 101–111)
CO2: 25 mmol/L (ref 22–32)
CREATININE: 1.74 mg/dL — AB (ref 0.44–1.00)
GFR, EST AFRICAN AMERICAN: 27 mL/min — AB (ref >60)
GFR, EST NON AFRICAN AMERICAN: 24 mL/min — AB (ref >60)
Glucose, Bld: 101 mg/dL — ABNORMAL HIGH (ref 65–99)
Potassium: 4.4 mmol/L (ref 3.5–5.1)
SODIUM: 139 mmol/L (ref 135–145)

## 2017-08-18 MED ORDER — ALUM & MAG HYDROXIDE-SIMETH 200-200-20 MG/5ML PO SUSP
15.0000 mL | ORAL | Status: DC | PRN
  Administered 2017-08-18 – 2017-08-21 (×3): 15 mL via ORAL
  Filled 2017-08-18 (×4): qty 30

## 2017-08-18 NOTE — Plan of Care (Signed)
Plan of care reviewed and discussed with patient.   

## 2017-08-18 NOTE — Progress Notes (Signed)
Bilateral lower extremity venous duplex completed. No obvious evidence of DVT, superficial thrombosis, or  Baker's cyst. Unable to visualize the peroneal veins well enough to fully evaluate. [facility], [place]  08/18/2017 4:33 PM

## 2017-08-18 NOTE — Progress Notes (Signed)
PT Cancellation Note  Patient Details Name: [name] MRN: [id number] DOB: 09/08/1918   Cancelled Treatment:    Reason Eval/Treat Not Completed: Fatigue/lethargy limiting ability to participate, patient declined. States she will try later. PT will check back later.   [name] 08/18/2017, 12:07 PM [name] PT [phone]

## 2017-08-18 NOTE — Progress Notes (Signed)
Patient ID: Lori [name], female   DOB: 04-05-1919, 84 y.o.   MRN: [id number]    PROGRESS NOTE  Lori [name]  [id number]:[id number] DOB: [birth year] DOA: 08/17/2017  PCP: Leanna Battles, MD   Brief Narrative:  84 y.o. female with a history of chronic lower extremity edema and recurrent cellulitis, chronic AFib on pradaxa, HTN, iron-deficiency anemia who prsented to the ED with worsening swelling and redness in both lower legs associated with erythema and warmth to touch. She was placed on clindamycin on 02/10 but her symptoms continued to get worse.   Assessment & Plan: Lower extremity swelling and erythema consistent with cellulitis - noted improvement, pt does however, have chronic venous stasis dermatitis - continue ancef for now and reassess in AM - possible transition to PO ABX in 24-48 hours depending on clinical course  - follow up on blood cultures   Hyperkalemia - Acute, mild. Possibly related to spironolactone which will be held. - now resolved - BMP in AM  Stage IV CKD - Cr 1.80 on admission, with baseline rising over the past year, most recently 1.5 - 1.7 - spironolactone held - BMP in AM  AFib: Rate-controlled - continue home medical regimen with Metoprolol and Pradaxa   OA s/p right THA, left TKA - continue home medical regimen with Tramadol   Left ARMD - Continue gtt's  Chronic anemia - Hg overall stable, no evidence of active bleeding - CBC in AM  Obesity - ,Body mass index is 29.87 kg/m.  DVT prophylaxis: Dabigatran  Code Status: Full  Family Communication: Patient at bedside  Disposition Plan: to be determined   Consultants:   None  Procedures:   None  Antimicrobials:   Cefazolin 02/12 -->  Subjective: No events overnight.   Objective: Vitals:   08/17/17 1700 08/17/17 1807 08/17/17 2304 08/18/17 0622  BP: (!) 146/63 135/62 (!) 103/56 (!) 119/52  Pulse: 91 81 99 85  Resp: 17 18 18 18   Temp:  98.9 F (37.2 C) 98.6 F (37 C)  98.4 F (36.9 C)  TempSrc:  Oral Oral Oral  SpO2: (!) 85% 95% 90% 92%  Weight:      Height:        Intake/Output Summary (Last 24 hours) at 08/18/2017 1433 Last data filed at 08/18/2017 9166 Gross per 24 hour  Intake 290 ml  Output 1500 ml  Net -1210 ml   Filed Weights   08/17/17 1207  Weight: 78.9 kg (174 lb)   Examination:  General exam: Appears calm and comfortable  Respiratory system: Clear to auscultation. Respiratory effort normal. Cardiovascular system:  IRRR. No rubs, gallops or clicks.  Gastrointestinal system: Abdomen is nondistended, soft and nontender. No organomegaly or masses felt. Normal bowel sounds heard. Central nervous system: No focal neurological deficits. Extremities: Symmetric 5 x 5 power.Bilateral chronic venous stasis changes, edema and erythema, warmth to touch  Data Reviewed: I have personally reviewed following labs and imaging studies  CBC: Recent Labs  Lab 08/17/17 1243  WBC 7.3  NEUTROABS 4.9  HGB 11.0*  HCT 36.6  MCV 67.0*  PLT 060   Basic Metabolic Panel: Recent Labs  Lab 08/17/17 1243 08/18/17 0520  NA 138 139  K 5.8* 4.4  CL 102 102  CO2 25 25  GLUCOSE 91 101*  BUN 43* 37*  CREATININE 1.80* 1.74*  CALCIUM 9.1 9.2   Urine analysis:    Component Value Date/Time   COLORURINE YELLOW 08/17/2017 [place] 08/17/2017 1416  LABSPEC 1.009 08/17/2017 1416   PHURINE 6.0 08/17/2017 1416   GLUCOSEU NEGATIVE 08/17/2017 1416   HGBUR NEGATIVE 08/17/2017 1416   [place] 08/17/2017 1416   [place] 08/17/2017 1416   PROTEINUR NEGATIVE 08/17/2017 1416   UROBILINOGEN 0.2 07/10/2012 1122   NITRITE NEGATIVE 08/17/2017 1416   LEUKOCYTESUR NEGATIVE 08/17/2017 1416   Recent Results (from the past 240 hour(s))  Culture, blood (routine x 2)     Status: None (Preliminary result)   Collection Time: 08/17/17 12:53 PM  Result Value Ref Range Status   Specimen Description   Final    BLOOD LEFT  WRIST Performed at [facility], [place] [address] [place], [place] [address]    Special Requests   Final    BOTTLES DRAWN AEROBIC AND ANAEROBIC Blood Culture adequate volume Performed at [facility] [address] [place], [place] [address]    Culture   Final    NO GROWTH < 24 HOURS Performed at [facility] [address] [place],  [address]    Report Status PENDING  Incomplete    Radiology Studies: No results found.  Scheduled Meds: . dabigatran  75 mg Oral Q12H  . dorzolamide-timolol  1 drop Left Eye BID  . latanoprost  1 drop Left Eye QHS  . loratadine  10 mg Oral QHS  . metoprolol tartrate  25 mg Oral BID  . pantoprazole  40 mg Oral Daily  . sodium polystyrene  45 g Oral Once   Continuous Infusions: .  ceFAZolin (ANCEF) IV Stopped (08/18/17 1015)     LOS: 0 days   Time spent: 35 minutes   Faye Ramsay, MD [facility] Pager [phone]  If 7PM-7AM, please contact night-coverage [email or web address] Password [facility] 08/18/2017, 2:33 PM

## 2017-08-18 NOTE — Progress Notes (Signed)
PT Cancellation Note  Patient Details Name: Lori [name] MRN: [id number] DOB: 1918/10/13   Cancelled Treatment:    Reason Eval/Treat Not Completed: Patient at procedure or test/unavailable, will check back tomorrow.   [name] 08/18/2017, 4:31 PM

## 2017-08-19 LAB — URINE CULTURE

## 2017-08-19 LAB — CBC
HCT: 30.6 % — ABNORMAL LOW (ref 36.0–46.0)
Hemoglobin: 9.7 g/dL — ABNORMAL LOW (ref 12.0–15.0)
MCH: 20.8 pg — AB (ref 26.0–34.0)
MCHC: 31.7 g/dL (ref 30.0–36.0)
MCV: 65.5 fL — ABNORMAL LOW (ref 78.0–100.0)
Platelets: 167 10*3/uL (ref 150–400)
RBC: 4.67 MIL/uL (ref 3.87–5.11)
RDW: 16.2 % — AB (ref 11.5–15.5)
WBC: 8.8 10*3/uL (ref 4.0–10.5)

## 2017-08-19 LAB — BASIC METABOLIC PANEL
Anion gap: 11 (ref 5–15)
BUN: 37 mg/dL — AB (ref 6–20)
CALCIUM: 8.6 mg/dL — AB (ref 8.9–10.3)
CO2: 25 mmol/L (ref 22–32)
CREATININE: 1.7 mg/dL — AB (ref 0.44–1.00)
Chloride: 102 mmol/L (ref 101–111)
GFR calc non Af Amer: 24 mL/min — ABNORMAL LOW (ref >60)
GFR, EST AFRICAN AMERICAN: 28 mL/min — AB (ref >60)
Glucose, Bld: 102 mg/dL — ABNORMAL HIGH (ref 65–99)
Potassium: 4.2 mmol/L (ref 3.5–5.1)
SODIUM: 138 mmol/L (ref 135–145)

## 2017-08-19 MED ORDER — SODIUM CHLORIDE 0.9 % IV BOLUS (SEPSIS)
500.0000 mL | Freq: Once | INTRAVENOUS | Status: AC
  Administered 2017-08-19: 500 mL via INTRAVENOUS

## 2017-08-19 MED ORDER — SENNA 8.6 MG PO TABS
1.0000 | ORAL_TABLET | Freq: Two times a day (BID) | ORAL | Status: DC
  Administered 2017-08-19 – 2017-08-22 (×5): 8.6 mg via ORAL
  Filled 2017-08-19 (×6): qty 1

## 2017-08-19 NOTE — Evaluation (Addendum)
Physical Therapy Evaluation Patient Details Name: Lori [name] MRN: [id number] DOB: 04/18/1919 Today's Date: 08/19/2017   History of Present Illness  84 y.o. female with a history of chronic lower extremity edema and recurrent cellulitis, chronic AFib , HTN, iron-deficiency anemia who presented to the ED 08/17/17  with worsening swelling and redness in both lower legs associated with erythema and warmth to touch.   Clinical Impression  The patient is very pleasant. Ambulated x 120' with 4 wheeled RW. The patient should  impoive well enough to return to her apartment.  Pt admitted with above diagnosis. Pt currently with functional limitations due to the deficits listed below (see PT Problem List).  Pt will benefit from skilled PT to increase their independence and safety with mobility to allow discharge to the venue listed below.  Patient should progress to return to her apartment.     Follow Up Recommendations Home health PT    Equipment Recommendations  None recommended by PT    Recommendations for Other Services       Precautions / Restrictions Precautions Precautions: Fall      Mobility  Bed Mobility               General bed mobility comments: in recliner  Transfers Overall transfer level: Needs assistance Equipment used: 4-wheeled walker Transfers: Sit to/from Stand Sit to Stand: Supervision         General transfer comment: extra time to rise, uses armrests, bar in BR and RW.   Ambulation/Gait Ambulation/Gait assistance: Supervision Ambulation Distance (Feet): 125 Feet Assistive device: 4-wheeled walker Gait Pattern/deviations: Step-to pattern;Step-through pattern     General Gait Details: slow  cadence but no balance loss, maneuvres in  BR and around objects.  Stairs            Wheelchair Mobility    Modified Rankin (Stroke Patients Only)       Balance Overall balance assessment: Needs assistance Sitting-balance support: No upper  extremity supported;Feet supported Sitting balance-Leahy Scale: Good     Standing balance support: During functional activity;No upper extremity supported Standing balance-Leahy Scale: Fair Standing balance comment: manages  briefs and standing washing hands without loss of balance                             Pertinent Vitals/Pain Pain Assessment: No/denies pain    Home Living Family/patient expects to be discharged to:: Private residence Living Arrangements: Alone Available Help at Discharge: Family;Available PRN/intermittently Type of Home: Apartment Home Access: Level entry     Home Layout: One level Home Equipment: Hand held shower head;Grab bars - tub/shower;Grab bars - toilet;Walker - 4 wheels Additional Comments: granddaughter helps when she can    Prior Function Level of Independence: Independent with assistive device(s)         Comments: was doing own meal prep and using microwave, laundry, sleeps in recliner     Hand Dominance   Dominant Hand: Right    Extremity/Trunk Assessment   Upper Extremity Assessment Upper Extremity Assessment: Generalized weakness    Lower Extremity Assessment Lower Extremity Assessment: Generalized weakness    Cervical / Trunk Assessment Cervical / Trunk Assessment: Normal  Communication   Communication: HOH  Cognition Arousal/Alertness: Awake/alert Behavior During Therapy: WFL for tasks assessed/performed Overall Cognitive Status: Within Functional Limits for tasks assessed  General Comments      Exercises     Assessment/Plan    PT Assessment Patient needs continued PT services  PT Problem List Decreased strength;Decreased activity tolerance;Decreased mobility;Decreased knowledge of precautions;Decreased safety awareness;Decreased skin integrity;Decreased knowledge of use of DME       PT Treatment Interventions DME instruction;Gait  training;Functional mobility training;Therapeutic activities;Patient/family education    PT Goals (Current goals can be found in the Care Plan section)  Acute Rehab PT Goals Patient Stated Goal: to go home in a few days PT Goal Formulation: With patient Time For Goal Achievement: 08/26/17 Potential to Achieve Goals: Good    Frequency Min 3X/week   Barriers to discharge        Co-evaluation               AM-PAC PT "6 Clicks" Daily Activity  Outcome Measure Difficulty turning over in bed (including adjusting bedclothes, sheets and blankets)?: A Little Difficulty moving from lying on back to sitting on the side of the bed? : A Little Difficulty sitting down on and standing up from a chair with arms (e.g., wheelchair, bedside commode, etc,.)?: A Lot Help needed moving to and from a bed to chair (including a wheelchair)?: A Lot Help needed walking in hospital room?: A Lot Help needed climbing 3-5 steps with a railing? : Total 6 Click Score: 13    End of Session   Activity Tolerance: Patient tolerated treatment well Patient left: in chair;with call bell/phone within reach Nurse Communication: Mobility status PT Visit Diagnosis: Unsteadiness on feet (R26.81)    Time: 0630-1601 PT Time Calculation (min) (ACUTE ONLY): 23 min   Charges:   PT Evaluation $PT Eval Low Complexity: 1 Low PT Treatments $Gait Training: 8-22 mins   PT G CodesTresa Endo PT [phone]   [name] 08/19/2017, 12:39 PM

## 2017-08-19 NOTE — Progress Notes (Signed)
Patient ID: Lori [name], female   DOB: [birth year], 84 y.o.   MRN: [id number]    PROGRESS NOTE  Lori [name]  [id number]:[id number] DOB: 08-16-1918 DOA: 08/17/2017  PCP: Leanna Battles, MD   Brief Narrative:  84 y.o. female with a history of chronic lower extremity edema and recurrent cellulitis, chronic AFib on pradaxa, HTN, iron-deficiency anemia who prsented to the ED with worsening swelling and redness in both lower legs associated with erythema and warmth to touch. She was placed on clindamycin on 02/10 but her symptoms continued to get worse.   Assessment & Plan: Lower extremity swelling and erythema consistent with cellulitis - overall improving, pt says she can also see significant difference  - continue Ancef for 24 more hours  - reassess in AM - blood cultures with no growth to date   Hyperkalemia - Acute, mild. Possibly related to spironolactone which will be held. - resolved  - BMP in AM  Stage IV CKD - Cr 1.80 on admission, with baseline rising over the past year, most recently 1.5 - 1.7 - spironolactone held - Cr overall trending down  - BMP in AM  AFib: Rate-controlled - continue home medical regimen with Metoprolol and Pradaxa  - rate controlled for now   OA s/p right THA, left TKA - continue home medical regimen with Tramadol   Left ARMD - Continue gtt's  Chronic anemia - drop in Hg noted but no evidence of active bleeding  - CBC In AM  Obesity - ,Body mass index is 29.87 kg/m.  DVT prophylaxis: Dabigatran  Code Status: Full  Family Communication: Patient at bedside  Disposition Plan: in 24 hours   Consultants:   None  Procedures:   None  Antimicrobials:   Cefazolin 02/12 -->  Subjective: No events overnight.   Objective: Vitals:   08/18/17 2132 08/18/17 2231 08/19/17 0626 08/19/17 0918  BP: 116/63  97/68 (!) 103/51  Pulse: 73  73 63  Resp: 14  16 18   Temp: (!) 101.3 F (38.5 C) 97.9 F (36.6 C) 98.3 F (36.8 C) 97.9 F  (36.6 C)  TempSrc: Oral Oral Oral Oral  SpO2: 94%  94% 97%  Weight:      Height:        Intake/Output Summary (Last 24 hours) at 08/19/2017 1322 Last data filed at 08/19/2017 0900 Gross per 24 hour  Intake 710 ml  Output 100 ml  Net 610 ml   Filed Weights   08/17/17 1207  Weight: 78.9 kg (174 lb)   Physical Exam  Constitutional: Appears well-developed and well-nourished. No distress.  CVS: RRR, S1/S2 +, no gallops, no carotid bruit.  Pulmonary: Effort and breath sounds normal, no stridor, rhonchi, wheezes, rales.  Abdominal: Soft. BS +,  no distension, tenderness, rebound or guarding.  Musculoskeletal: Normal range of motion. Bilateral chronic venous stasis dermatitis, +1 bilateral LE edema with mild erythema and less TTP Neuro: Alert. Normal reflexes, muscle tone coordination. No cranial nerve deficit. \ Data Reviewed: I have personally reviewed following labs and imaging studies  CBC: Recent Labs  Lab 08/17/17 1243 08/19/17 0617  WBC 7.3 8.8  NEUTROABS 4.9  --   HGB 11.0* 9.7*  HCT 36.6 30.6*  MCV 67.0* 65.5*  PLT 245 284   Basic Metabolic Panel: Recent Labs  Lab 08/17/17 1243 08/18/17 0520 08/19/17 0617  NA 138 139 138  K 5.8* 4.4 4.2  CL 102 102 102  CO2 25 25 25   GLUCOSE 91 101* 102*  BUN  43* 37* 37*  CREATININE 1.80* 1.74* 1.70*  CALCIUM 9.1 9.2 8.6*   Urine analysis:    Component Value Date/Time   COLORURINE YELLOW 08/17/2017 [place] 08/17/2017 1416   LABSPEC 1.009 08/17/2017 1416   PHURINE 6.0 08/17/2017 1416   GLUCOSEU NEGATIVE 08/17/2017 1416   HGBUR NEGATIVE 08/17/2017 1416   BILIRUBINUR NEGATIVE 08/17/2017 1416   KETONESUR NEGATIVE 08/17/2017 1416   PROTEINUR NEGATIVE 08/17/2017 1416   UROBILINOGEN 0.2 07/10/2012 1122   NITRITE NEGATIVE 08/17/2017 1416   LEUKOCYTESUR NEGATIVE 08/17/2017 1416   Recent Results (from the past 240 hour(s))  Culture, blood (routine x 2)     Status: None (Preliminary result)   Collection  Time: 08/17/17 12:53 PM  Result Value Ref Range Status   Specimen Description   Final    BLOOD LEFT WRIST Performed at [facility], [place] [address] [place], [place] [address]    Special Requests   Final    BOTTLES DRAWN AEROBIC AND ANAEROBIC Blood Culture adequate volume Performed at [facility] [address], [place], [place] [address]    Culture   Final    NO GROWTH < 24 HOURS Performed at [facility] [address], [place], [place] [address]    Report Status PENDING  Incomplete  Urine culture     Status: Abnormal   Collection Time: 08/17/17  2:16 PM  Result Value Ref Range Status   Specimen Description   Final    URINE, RANDOM Performed at [facility] [address], [place], [place] [address]    Special Requests   Final    NONE Performed at [facility], [place] [address], [place],  [address]    Culture (A)  Final    <10,000 COLONIES/mL INSIGNIFICANT GROWTH Performed at [facility] [address] [place],  [address]    Report Status 08/19/2017 FINAL  Final    Radiology Studies: Vas [place] Lower Extremity Venous (dvt) Result Date: 08/18/2017 Right: There is no evidence of deep vein thrombosis in the lower extremity.Unable to fully evaluate the peroneal veins due to edema.There is no evidence of superficial venous thrombosis. There is no evidence of a Baker's cyst. Left: There is no evidence of deep vein thrombosis in the lower extremity.Unable to fully evaluate the peroneal veins due to edema.There is no evidence of superficial venous thrombosis. There is no evidence of a Baker's cyst.    Scheduled Meds:  . dabigatran  75 mg Oral Q12H  . dorzolamide-timolol  1 drop Left Eye BID  . latanoprost  1 drop Left Eye QHS  . loratadine  10 mg Oral QHS  . metoprolol tartrate  25 mg Oral BID  . pantoprazole  40 mg Oral Daily  . sodium polystyrene  45 g Oral Once   Continuous  Infusions: .  ceFAZolin (ANCEF) IV 1 g (08/19/17 1051)     LOS: 1 day   Time spent: 25 minutes   Faye Ramsay, MD [facility] Pager [phone]  If 7PM-7AM, please contact night-coverage [email or web address] Password [email or web address] 08/19/2017, 1:22 PM

## 2017-08-20 LAB — BASIC METABOLIC PANEL
ANION GAP: 9 (ref 5–15)
BUN: 33 mg/dL — ABNORMAL HIGH (ref 6–20)
CALCIUM: 8.4 mg/dL — AB (ref 8.9–10.3)
CO2: 25 mmol/L (ref 22–32)
Chloride: 101 mmol/L (ref 101–111)
Creatinine, Ser: 1.55 mg/dL — ABNORMAL HIGH (ref 0.44–1.00)
GFR calc Af Amer: 31 mL/min — ABNORMAL LOW (ref >60)
GFR calc non Af Amer: 27 mL/min — ABNORMAL LOW (ref >60)
GLUCOSE: 99 mg/dL (ref 65–99)
Potassium: 4 mmol/L (ref 3.5–5.1)
Sodium: 135 mmol/L (ref 135–145)

## 2017-08-20 LAB — CBC
HEMATOCRIT: 30.1 % — AB (ref 36.0–46.0)
Hemoglobin: 9.6 g/dL — ABNORMAL LOW (ref 12.0–15.0)
MCH: 20.8 pg — ABNORMAL LOW (ref 26.0–34.0)
MCHC: 31.9 g/dL (ref 30.0–36.0)
MCV: 65.3 fL — AB (ref 78.0–100.0)
Platelets: 168 10*3/uL (ref 150–400)
RBC: 4.61 MIL/uL (ref 3.87–5.11)
RDW: 16 % — AB (ref 11.5–15.5)
WBC: 8.2 10*3/uL (ref 4.0–10.5)

## 2017-08-20 MED ORDER — DOXYCYCLINE HYCLATE 100 MG PO TABS
100.0000 mg | ORAL_TABLET | Freq: Two times a day (BID) | ORAL | Status: DC
  Administered 2017-08-20 – 2017-08-22 (×5): 100 mg via ORAL
  Filled 2017-08-20 (×5): qty 1

## 2017-08-20 MED ORDER — MENTHOL 3 MG MT LOZG
1.0000 | LOZENGE | OROMUCOSAL | Status: DC | PRN
  Filled 2017-08-20: qty 9

## 2017-08-20 NOTE — Progress Notes (Signed)
Patient ID: Lori [name], female   DOB: February 21, 1919, 84 y.o.   MRN: [id number]    PROGRESS NOTE  Lori [name]  NFA:[id number] DOB: 02-08-1919 DOA: 08/17/2017  PCP: Lori Battles, MD   Brief Narrative:  84 y.o. female with a history of chronic lower extremity edema and recurrent cellulitis, chronic AFib on pradaxa, HTN, iron-deficiency anemia who prsented to the ED with worsening swelling and redness in both lower legs associated with erythema and warmth to touch. She was placed on clindamycin on 02/10 but her symptoms continued to get worse.   Assessment & Plan: Lower extremity swelling and erythema consistent with cellulitis - overall improving, still with some erythema but no TTP  - plan to transition to oral ABX doxy and reassess in AM  - blood cultures with no growth   Hyperkalemia - Acute, mild. Possibly related to spironolactone which will be held. - resolved   Stage IV CKD - Cr 1.80 on admission, with baseline rising over the past year, most recently 1.5 - 1.7 - spironolactone held - Cr trending down  - BMP in AM  AFib: Rate-controlled - continue home medical regimen with Metoprolol and Pradaxa  - rate controlled   OA s/p right THA, left TKA - continue home medical regimen with Tramadol   Left ARMD - Continue gtt's  Chronic anemia - drop in Hg noted but no evidence of active bleeding   Obesity - ,Body mass index is 29.87 kg/m.  DVT prophylaxis: Dabigatran  Code Status: Full  Family Communication: Patient at bedside  Disposition Plan: plan for d/c in AM if responding to doxy   Consultants:   None  Procedures:   None  Antimicrobials:   Cefazolin 02/12 --> 02/15  Doxy 02/15 -->  Subjective: Reports feeling better.   Objective: Vitals:   08/19/17 1600 08/19/17 2206 08/20/17 0130 08/20/17 0618  BP: (!) 105/47 (!) 90/45 114/65 (!) 108/56  Pulse: 63 72 65 85  Resp:  17  18  Temp:  98.7 F (37.1 C)  98.2 F (36.8 C)  TempSrc:  Oral   Oral  SpO2:  95%  95%  Weight:      Height:        Intake/Output Summary (Last 24 hours) at 08/20/2017 0829 Last data filed at 08/20/2017 0618 Gross per 24 hour  Intake 1850 ml  Output -  Net 1850 ml   Filed Weights   08/17/17 1207  Weight: 78.9 kg (174 lb)   Physical Exam  Constitutional: Appears well-developed and well-nourished. No distress.  CVS: RRR, S1/S2 +, no murmurs, no gallops, no carotid bruit.  Pulmonary: Effort and breath sounds normal, no stridor, rhonchi, wheezes, rales.  Abdominal: Soft. BS +,  no distension, tenderness, rebound or guarding.  Musculoskeletal: Normal range of motion. Bilateral chronic dermatitis in LE, mild erythema and warmth to touch  Psychiatric: Normal mood and affect. Behavior, judgment, thought content normal.   Data Reviewed: I have personally reviewed following labs and imaging studies  CBC: Recent Labs  Lab 08/17/17 1243 08/19/17 0617 08/20/17 0517  WBC 7.3 8.8 8.2  NEUTROABS 4.9  --   --   HGB 11.0* 9.7* 9.6*  HCT 36.6 30.6* 30.1*  MCV 67.0* 65.5* 65.3*  PLT 245 167 469   Basic Metabolic Panel: Recent Labs  Lab 08/17/17 1243 08/18/17 0520 08/19/17 0617 08/20/17 0517  NA 138 139 138 135  K 5.8* 4.4 4.2 4.0  CL 102 102 102 101  CO2 25 25 25  25  GLUCOSE 91 101* 102* 99  BUN 43* 37* 37* 33*  CREATININE 1.80* 1.74* 1.70* 1.55*  CALCIUM 9.1 9.2 8.6* 8.4*   Urine analysis:    Component Value Date/Time   COLORURINE YELLOW 08/17/2017 1416   APPEARANCEUR CLEAR 08/17/2017 1416   LABSPEC 1.009 08/17/2017 1416   PHURINE 6.0 08/17/2017 1416   GLUCOSEU NEGATIVE 08/17/2017 1416   HGBUR NEGATIVE 08/17/2017 1416   BILIRUBINUR NEGATIVE 08/17/2017 1416   KETONESUR NEGATIVE 08/17/2017 1416   PROTEINUR NEGATIVE 08/17/2017 1416   UROBILINOGEN 0.2 07/10/2012 1122   NITRITE NEGATIVE 08/17/2017 1416   LEUKOCYTESUR NEGATIVE 08/17/2017 1416   Recent Results (from the past 240 hour(s))  Culture, blood (routine x 2)     Status: None  (Preliminary result)   Collection Time: 08/17/17 12:53 PM  Result Value Ref Range Status   Specimen Description   Final    BLOOD LEFT WRIST Performed at [facility], [place] [address], [place], [place] [address]    Special Requests   Final    BOTTLES DRAWN AEROBIC AND ANAEROBIC Blood Culture adequate volume Performed at [facility] [address] [place], [place] [address]    Culture   Final    NO GROWTH 2 DAYS Performed at [facility] [address], [place], [place] [address]    Report Status PENDING  Incomplete  Urine culture     Status: Abnormal   Collection Time: 08/17/17  2:16 PM  Result Value Ref Range Status   Specimen Description   Final    URINE, RANDOM Performed at [place] [address], [place], [place] [address]    Special Requests   Final    NONE Performed at [facility], [place] [address], [place], [place] [address]    Culture (A)  Final    <10,000 COLONIES/mL INSIGNIFICANT GROWTH Performed at [facility] [address] [place],  [address]    Report Status 08/19/2017 FINAL  Final    Radiology Studies: Vas [place] Lower Extremity Venous (dvt) Result Date: 08/18/2017 Right: There is no evidence of deep vein thrombosis in the lower extremity.Unable to fully evaluate the peroneal veins due to edema.There is no evidence of superficial venous thrombosis. There is no evidence of a Baker's cyst. Left: There is no evidence of deep vein thrombosis in the lower extremity.Unable to fully evaluate the peroneal veins due to edema.There is no evidence of superficial venous thrombosis. There is no evidence of a Baker's cyst.    Scheduled Meds:  . dabigatran  75 mg Oral Q12H  . dorzolamide-timolol  1 drop Left Eye BID  . latanoprost  1 drop Left Eye QHS  . loratadine  10 mg Oral QHS  . metoprolol tartrate  25 mg Oral BID  . pantoprazole  40 mg Oral Daily  . senna  1 tablet  Oral BID  . sodium polystyrene  45 g Oral Once   Continuous Infusions: .  ceFAZolin (ANCEF) IV Stopped (08/19/17 2242)     LOS: 2 days   Time spent: 25 minutes   Lori Ramsay, MD [facility] Pager [phone]  If 7PM-7AM, please contact night-coverage [email or web address] Password [facility] 08/20/2017, 8:29 AM

## 2017-08-20 NOTE — Progress Notes (Signed)
Discharge planning, spoke with patient and daughter at bedside. Have chosen [facility] for [facility] PT, evaluate and treat. Also requested a bath aide. Contacted [facility] for referral. Has a RW, no other DME needs. [phone]

## 2017-08-20 NOTE — Progress Notes (Signed)
Physical Therapy Treatment Patient Details Name: Lori [name] MRN: [id number] DOB: 01/21/1919 Today's Date: 08/20/2017    History of Present Illness 84 y.o. female with a history of chronic lower extremity edema and recurrent cellulitis, chronic AFib , HTN, iron-deficiency anemia who presented to the ED 08/17/17  with worsening swelling and redness in both lower legs associated with erythema and warmth to touch.     PT Comments    Pt in bathroom with NT.  Assisted with static and dynamic standing balance in bathroom and negociating 4ww around obsticles.  Assisted with amb in hallway with one sitting rest break on her 4 ww seat.  Instructed on safety pushing walker against a wall and locking brakes prior to sit   Assisted amb back to her room and positioned in recliner to comfort with B LE elevated.    Follow Up Recommendations  Home health PT      Equipment Recommendations  None recommended by PT    Recommendations for Other Services       Precautions / Restrictions Precautions Precautions: Fall    Mobility  Bed Mobility               General bed mobility comments: OOB in bathroom with NT  Transfers Overall transfer level: Needs assistance Equipment used: 4-wheeled walker Transfers: Sit to/from Stand Sit to Stand: Supervision;Min guard         General transfer comment: extra time to rise, uses armrests, bar in BR and RW.   Ambulation/Gait Ambulation/Gait assistance: Supervision;Min guard Ambulation Distance (Feet): 85 Feet Assistive device: 4-wheeled walker Gait Pattern/deviations: Step-to pattern;Step-through pattern Gait velocity: decreased   General Gait Details: slow  cadence but no balance loss, maneuvres in  BR and around objects.   Stairs            Wheelchair Mobility    Modified Rankin (Stroke Patients Only)       Balance                                            Cognition Arousal/Alertness:  Awake/alert Behavior During Therapy: WFL for tasks assessed/performed Overall Cognitive Status: Within Functional Limits for tasks assessed                                 General Comments: sharp      Exercises      General Comments        Pertinent Vitals/Pain Pain Assessment: No/denies pain    Home Living                      Prior Function            PT Goals (current goals can now be found in the care plan section) Progress towards PT goals: Progressing toward goals    Frequency    Min 3X/week      PT Plan Current plan remains appropriate    Co-evaluation              AM-PAC PT "6 Clicks" Daily Activity  Outcome Measure  Difficulty turning over in bed (including adjusting bedclothes, sheets and blankets)?: A Little Difficulty moving from lying on back to sitting on the side of the bed? : A Little Difficulty sitting down on and standing up from a  chair with arms (e.g., wheelchair, bedside commode, etc,.)?: A Lot Help needed moving to and from a bed to chair (including a wheelchair)?: A Lot Help needed walking in hospital room?: A Lot Help needed climbing 3-5 steps with a railing? : Total 6 Click Score: 13    End of Session Equipment Utilized During Treatment: Gait belt Activity Tolerance: Patient tolerated treatment well Patient left: in chair;with call bell/phone within reach Nurse Communication: Mobility status PT Visit Diagnosis: Unsteadiness on feet (R26.81)     Time: 6803-2122 PT Time Calculation (min) (ACUTE ONLY): 17 min  Charges:  $Gait Training: 8-22 mins                    G Codes:       Rica Koyanagi  PTA [facility]  Acute  Rehab Pager      [phone]

## 2017-08-21 LAB — CBC
HCT: 30.2 % — ABNORMAL LOW (ref 36.0–46.0)
HEMATOCRIT: 30.3 % — AB (ref 36.0–46.0)
HEMOGLOBIN: 9.7 g/dL — AB (ref 12.0–15.0)
HEMOGLOBIN: 9.4 g/dL — AB (ref 12.0–15.0)
MCH: 21 pg — ABNORMAL LOW (ref 26.0–34.0)
MCH: 20.4 pg — AB (ref 26.0–34.0)
MCHC: 32 g/dL (ref 30.0–36.0)
MCHC: 31.1 g/dL (ref 30.0–36.0)
MCV: 65.4 fL — AB (ref 78.0–100.0)
MCV: 65.5 fL — AB (ref 78.0–100.0)
Platelets: 169 10*3/uL (ref 150–400)
Platelets: 193 10*3/uL (ref 150–400)
RBC: 4.63 MIL/uL (ref 3.87–5.11)
RBC: 4.61 MIL/uL (ref 3.87–5.11)
RDW: 16 % — ABNORMAL HIGH (ref 11.5–15.5)
RDW: 16.1 % — ABNORMAL HIGH (ref 11.5–15.5)
WBC: 7 10*3/uL (ref 4.0–10.5)
WBC: 7.4 10*3/uL (ref 4.0–10.5)

## 2017-08-21 LAB — BASIC METABOLIC PANEL
ANION GAP: 9 (ref 5–15)
BUN: 32 mg/dL — ABNORMAL HIGH (ref 6–20)
CHLORIDE: 102 mmol/L (ref 101–111)
CO2: 24 mmol/L (ref 22–32)
Calcium: 8.7 mg/dL — ABNORMAL LOW (ref 8.9–10.3)
Creatinine, Ser: 1.61 mg/dL — ABNORMAL HIGH (ref 0.44–1.00)
GFR calc Af Amer: 30 mL/min — ABNORMAL LOW (ref >60)
GFR calc non Af Amer: 26 mL/min — ABNORMAL LOW (ref >60)
GLUCOSE: 93 mg/dL (ref 65–99)
Potassium: 4.4 mmol/L (ref 3.5–5.1)
Sodium: 135 mmol/L (ref 135–145)

## 2017-08-21 NOTE — Progress Notes (Signed)
Patient ID: Lori [name], female   DOB: 05-Nov-1918, 84 y.o.   MRN: [id number]    PROGRESS NOTE  Lori [name]  VQQ:[id number] DOB: 07/14/1918 DOA: 08/17/2017  PCP: Leanna Battles, MD   Brief Narrative:  84 y.o. female with a history of chronic lower extremity edema and recurrent cellulitis, chronic AFib on pradaxa, HTN, iron-deficiency anemia who prsented to the ED with worsening swelling and redness in both lower legs associated with erythema and warmth to touch. She was placed on clindamycin on 02/10 but her symptoms continued to get worse.   Assessment & Plan: Lower extremity swelling and erythema consistent with cellulitis - was transitioned to oral doxycycline, erythema and edema overall better  - blood cultures with no growth  - if it looks better tomorrow, can likely go home   Hyperkalemia - Acute, mild. Possibly related to spironolactone which will be held. - resolved   Stage IV CKD - Cr 1.80 on admission, with baseline rising over the past year, most recently 1.5 - 1.7 - spironolactone held - Cr overall stable  - BMP in AM  AFib: Rate-controlled - continue home medical regimen with Metoprolol and Pradaxa  - rate controlled   OA s/p right THA, left TKA - continue home medical regimen with Tramadol   Left ARMD - Continue gtt's  Chronic anemia - drop in Hg noted but no evidence of active bleeding   Obesity - ,Body mass index is 29.87 kg/m.  DVT prophylaxis: Dabigatran  Code Status: Full  Family Communication: pt at bedside  Disposition Plan: d/c in AM  Consultants:   None  Procedures:   None  Antimicrobials:   Cefazolin 02/12 --> 02/15  Doxy 02/15 -->  Subjective: Pt reports feeling better.   Objective: Vitals:   08/21/17 0542 08/21/17 1402 08/21/17 1403 08/21/17 1533  BP: (!) 103/47 (!) 96/45 (!) 92/41 (!) 112/54  Pulse: 80  71   Resp: 16  16   Temp: 98.2 F (36.8 C)  97.7 F (36.5 C)   TempSrc: Oral  Oral   SpO2: 98%  98%     Weight:      Height:        Intake/Output Summary (Last 24 hours) at 08/21/2017 1618 Last data filed at 08/21/2017 1403 Gross per 24 hour  Intake 1070 ml  Output -  Net 1070 ml   Filed Weights   08/17/17 1207  Weight: 78.9 kg (174 lb)   Physical Exam  Constitutional: Appears well-developed and well-nourished. No distress.  CVS: RRR, S1/S2 +, no murmurs, no gallops, no carotid bruit.  Pulmonary: Effort and breath sounds normal, no stridor, rhonchi, wheezes, rales.  Abdominal: Soft. BS +,  no distension, tenderness, rebound or guarding.  Neuro: Alert. Normal reflexes, muscle tone coordination. No cranial nerve deficit. Skin: bilateral LE erythema improving, chronic venous dermatitis    Data Reviewed: I have personally reviewed following labs and imaging studies  CBC: Recent Labs  Lab 08/17/17 1243 08/19/17 0617 08/20/17 0517 08/21/17 0513  WBC 7.3 8.8 8.2 7.0  NEUTROABS 4.9  --   --   --   HGB 11.0* 9.7* 9.6* 9.7*  HCT 36.6 30.6* 30.1* 30.3*  MCV 67.0* 65.5* 65.3* 65.4*  PLT 245 167 168 433   Basic Metabolic Panel: Recent Labs  Lab 08/17/17 1243 08/18/17 0520 08/19/17 0617 08/20/17 0517 08/21/17 0513  NA 138 139 138 135 135  K 5.8* 4.4 4.2 4.0 4.4  CL 102 102 102 101 102  CO2 25  25 25 25 24   GLUCOSE 91 101* 102* 99 93  BUN 43* 37* 37* 33* 32*  CREATININE 1.80* 1.74* 1.70* 1.55* 1.61*  CALCIUM 9.1 9.2 8.6* 8.4* 8.7*   Urine analysis:    Component Value Date/Time   COLORURINE YELLOW 08/17/2017 1416   APPEARANCEUR CLEAR 08/17/2017 1416   LABSPEC 1.009 08/17/2017 1416   PHURINE 6.0 08/17/2017 1416   GLUCOSEU NEGATIVE 08/17/2017 1416   HGBUR NEGATIVE 08/17/2017 1416   BILIRUBINUR NEGATIVE 08/17/2017 1416   KETONESUR NEGATIVE 08/17/2017 1416   PROTEINUR NEGATIVE 08/17/2017 1416   UROBILINOGEN 0.2 07/10/2012 1122   NITRITE NEGATIVE 08/17/2017 1416   LEUKOCYTESUR NEGATIVE 08/17/2017 1416   Recent Results (from the past 240 hour(s))  Culture, blood  (routine x 2)     Status: None (Preliminary result)   Collection Time: 08/17/17 12:53 PM  Result Value Ref Range Status   Specimen Description   Final    BLOOD LEFT WRIST Performed at [facility], [place] [address] [place], [place] [address]    Special Requests   Final    BOTTLES DRAWN AEROBIC AND ANAEROBIC Blood Culture adequate volume Performed at [place] [address] [place], [place] [address]    Culture   Final    NO GROWTH 4 DAYS Performed at [facility], [place] [address] [place], [place] [address]    Report Status PENDING  Incomplete  Urine culture     Status: Abnormal   Collection Time: 08/17/17  2:16 PM  Result Value Ref Range Status   Specimen Description   Final    URINE, RANDOM Performed at [place] [address] [place], [place] [address]    Special Requests   Final    NONE Performed at [facility], [place] [address], [place], [place] [address]    Culture (A)  Final    <10,000 COLONIES/mL INSIGNIFICANT GROWTH Performed at [place] [address] [place], [place] [address]    Report Status 08/19/2017 FINAL  Final    Radiology Studies: Vas [place] Lower Extremity Venous (dvt) Result Date: 08/18/2017 Right: There is no evidence of deep vein thrombosis in the lower extremity.Unable to fully evaluate the peroneal veins due to edema.There is no evidence of superficial venous thrombosis. There is no evidence of a Baker's cyst. Left: There is no evidence of deep vein thrombosis in the lower extremity.Unable to fully evaluate the peroneal veins due to edema.There is no evidence of superficial venous thrombosis. There is no evidence of a Baker's cyst.    Scheduled Meds:  . dabigatran  75 mg Oral Q12H  . dorzolamide-timolol  1 drop Left Eye BID  . doxycycline  100 mg Oral Q12H  . latanoprost  1 drop Left Eye QHS  . loratadine  10 mg Oral QHS  . metoprolol tartrate  25 mg  Oral BID  . pantoprazole  40 mg Oral Daily  . senna  1 tablet Oral BID  . sodium polystyrene  45 g Oral Once   Continuous Infusions:    LOS: 3 days   Time spent: 25 minutes   Faye Ramsay, MD [facility] Pager [phone]  If 7PM-7AM, please contact night-coverage [email or web address] Password [facility] 08/21/2017, 4:18 PM

## 2017-08-21 NOTE — Care Management Important Message (Signed)
Important Message  Patient Details  Name: Lori [name] MRN: [id number] Date of Birth: 09-27-1918   Medicare Important Message Given:  Yes    Erenest Rasher, RN 08/21/2017, 12:11 PM

## 2017-08-21 NOTE — Progress Notes (Signed)
Patient requested 10 pm meds to be given earlier.  Stated that she takes them after dinner and taking them later causes heartburn.

## 2017-08-22 LAB — BASIC METABOLIC PANEL
Anion gap: 7 (ref 5–15)
BUN: 30 mg/dL — AB (ref 6–20)
CHLORIDE: 102 mmol/L (ref 101–111)
CO2: 25 mmol/L (ref 22–32)
Calcium: 8.6 mg/dL — ABNORMAL LOW (ref 8.9–10.3)
Creatinine, Ser: 1.34 mg/dL — ABNORMAL HIGH (ref 0.44–1.00)
GFR calc Af Amer: 37 mL/min — ABNORMAL LOW (ref >60)
GFR calc non Af Amer: 32 mL/min — ABNORMAL LOW (ref >60)
Glucose, Bld: 86 mg/dL (ref 65–99)
POTASSIUM: 4.5 mmol/L (ref 3.5–5.1)
SODIUM: 134 mmol/L — AB (ref 135–145)

## 2017-08-22 LAB — CULTURE, BLOOD (ROUTINE X 2)
CULTURE: NO GROWTH
Special Requests: ADEQUATE

## 2017-08-22 MED ORDER — DOXYCYCLINE HYCLATE 100 MG PO TABS: 100.0000 mg | ORAL_TABLET | Freq: Two times a day (BID) | ORAL | 0 refills | Status: DC

## 2017-08-22 MED ORDER — TRAMADOL HCL 50 MG PO TABS: 50.0000 mg | ORAL_TABLET | Freq: Every day | ORAL | 0 refills | Status: DC | PRN

## 2017-08-22 NOTE — Discharge Summary (Signed)
Physician Discharge Summary  Lori [name] IRS:[id number] DOB: June 13, 1919 DOA: 08/17/2017  PCP: Leanna Battles, MD  Admit date: 08/17/2017 Discharge date: 08/22/2017  Recommendations for Outpatient Follow-up:  1. Pt will need to follow up with PCP in 1-2 weeks post discharge 2. Please obtain BMP to evaluate electrolytes and kidney function 3. Please also check CBC to evaluate Hg and Hct levels 4. Stop spironolactone due to hyperkalemia, can be resumed if PCP determined appropriate  5. Please note that pt said she no longer takes Diltiazem and we have removed this from her medication list  6. Doxy for 5 more days   Discharge Diagnoses:  Principal Problem:   Lower extremity cellulitis Active Problems:   ANEMIA, IRON DEFICIENCY   Atrial fibrillation (HCC)   Peripheral edema   Essential hypertension   Chronic venous insufficiency   Hyperkalemia   CKD (chronic kidney disease), stage IV (Lori [name])  Discharge Condition: Stable  Diet recommendation: Heart healthy diet discussed in details   History of present illness:  84 y.o.femalewitha history of chronic lower extremity edema and recurrent cellulitis, chronic AFib on pradaxa, HTN, iron-deficiency anemia who prsented to the ED with worsening swelling and redness in both lower legs associated with erythema and warmth to touch. She was placed on clindamycin on 02/10 but her symptoms continued to get worse.   Assessment & Plan: Lower extremity swelling and erythema consistent with cellulitis - was transitioned to oral doxycycline, erythema and edema overall better  - blood cultures with no growth  - complete doxy for 5 more days   Hyperkalemia - Acute, mild. Possibly related to spironolactone which will be held. - resolved   Stage IV CKD - Cr 1.80 on admission, with baseline rising over the past year, most recently 1.5 - 1.7 - spironolactone held - Cr overall stable   AFib: Rate-controlled - continue home medical  regimen with Metoprolol and Pradaxa  - rate controlled   OA s/p right THA, left TKA - continue home medical regimen with analgesia as needed   Left ARMD - Continue gtt's  Chronic anemia - Hg overall stable   Obesity - Body mass index is 29.87 kg/m.  DVT prophylaxis: Dabigatran  Code Status: Full  Family Communication: pt at bedside  Disposition Plan: home   Consultants:   None  Procedures:   None  Antimicrobials:   Cefazolin 02/12 --> 02/15  Doxy 02/15 -->  Procedures/Studies: Vas [place] Lower Extremity Venous (dvt)  Result Date: 08/18/2017 Right Venous Findings: Left Technical Findings:  Not visualized segments include Unable to fully evaluate the peroneal veins due to edema.   Final Interpretation: Right: There is no evidence of deep vein thrombosis in the lower extremity.Unable to fully evaluate the peroneal veins due to edema.There is no evidence of superficial venous thrombosis. There is no evidence of a Baker's cyst. Left: There is no evidence of deep vein thrombosis in the lower extremity.Unable to fully evaluate the peroneal veins due to edema.There is no evidence of superficial venous thrombosis. There is no evidence of a Baker's cyst.   Discharge Exam: Vitals:   08/21/17 2001 08/22/17 0504  BP: (!) 113/57 106/63  Pulse: 83 86  Resp: 17 15  Temp: 97.6 F (36.4 C) 97.7 F (36.5 C)  SpO2: 96% 95%   Vitals:   08/21/17 1403 08/21/17 1533 08/21/17 2001 08/22/17 0504  BP: (!) 92/41 (!) 112/54 (!) 113/57 106/63  Pulse: 71  83 86  Resp: 16  17 15   Temp: 97.7 F (  36.5 C)  97.6 F (36.4 C) 97.7 F (36.5 C)  TempSrc: Oral  Oral Oral  SpO2: 98%  96% 95%  Weight:      Height:        General: Pt is alert, follows commands appropriately, not in acute distress  Cardiovascular: Regular rate and rhythm, S1/S2 +, no rubs, no gallops Respiratory: Clear to auscultation bilaterally, no wheezing, no crackles, no rhonchi Abdominal: Soft, non tender, non  distended, bowel sounds +, no guarding Extremities: chronic dermatitis in bilateral LE's, erythema resolved and only minimal edema   Discharge Instructions  Discharge Instructions    Diet - low sodium heart healthy   Complete by:  As directed    Increase activity slowly   Complete by:  As directed      Allergies as of 08/22/2017      Reactions   Dust Mite Extract Shortness Of Breath, Itching   Mold Extract [trichophyton Mentagrophyte] Shortness Of Breath, Itching, Cough   VERY ALLERGIC/EXPOSURE RESULTS IN TREMENDOUS COUGHING (just one symptom)   Latex Itching   And certain fabrics   Tape Other (See Comments)   SKIN IS VERY THIN AND WILL TEAR AND BRUISE EASILY!! PLEASE USE COBAN WRAP-      Medication List    STOP taking these medications   azithromycin 250 MG tablet Commonly known as:  ZITHROMAX   clindamycin 300 MG capsule Commonly known as:  CLEOCIN   diltiazem 120 MG 24 hr capsule Commonly known as:  CARDIZEM CD   spironolactone 25 MG tablet Commonly known as:  ALDACTONE     TAKE these medications   ALIGN PO Take 1 capsule by mouth 2 (two) times daily.   dorzolamide-timolol 22.3-6.8 MG/ML ophthalmic solution Commonly known as:  COSOPT INSTILL 1 DROP IN THE LEFT EYE TWICE A DAY   doxycycline 100 MG tablet Commonly known as:  VIBRA-TABS Take 1 tablet (100 mg total) by mouth every 12 (twelve) hours.   furosemide 20 MG tablet Commonly known as:  LASIX Take 40mg  PO daily, take an extra 20mg  Pill if your weight is over 2 pounds in 24hrs or your notice extra fluid in your legs. What changed:    how much to take  how to take this  when to take this  additional instructions   loratadine 10 MG tablet Commonly known as:  CLARITIN Take 10 mg by mouth at bedtime.   metoprolol tartrate 25 MG tablet Commonly known as:  LOPRESSOR Take 1 tablet (25 mg total) by mouth 2 (two) times daily.   omeprazole 40 MG capsule Commonly known as:  PRILOSEC Take 40 mg by  mouth 2 (two) times daily.   PRADAXA 75 MG Caps capsule Generic drug:  dabigatran TAKE 1 CAPSULE EVERY 12 HOURS   PROVENTIL HFA 108 (90 Base) MCG/ACT inhaler Generic drug:  albuterol Inhale 2 puffs into the lungs every 6 (six) hours as needed for wheezing or shortness of breath.   traMADol 50 MG tablet Commonly known as:  ULTRAM Take 1 tablet (50 mg total) by mouth daily as needed for moderate pain or severe pain.   TRAVATAN Z 0.004 % Soln ophthalmic solution Generic drug:  Travoprost (BAK Free) INSTILL 1 DROP INTO THE LEFT EYE NIGHTLY   vitamin C 500 MG tablet Commonly known as:  ASCORBIC ACID Take 500 mg by mouth daily.      Follow-up Information    [facility] Follow up.   Specialty:  Home Health Services Why:  physical  therapy and bath aide Contact information: [place] [address] [phone]        Leanna Battles, MD Follow up.   Specialty:  Internal Medicine Contact information: [place] [address] [phone]        Theodis Blaze, MD Follow up.   Specialty:  Internal Medicine Why:  Pleaase call with questions [phone] Contact information: [address] [place] [address] [phone]            The results of significant diagnostics from this hospitalization (including imaging, microbiology, ancillary and laboratory) are listed below for reference.     Microbiology: Recent Results (from the past 240 hour(s))  Culture, blood (routine x 2)     Status: None (Preliminary result)   Collection Time: 08/17/17 12:53 PM  Result Value Ref Range Status   Specimen Description   Final    BLOOD LEFT WRIST Performed at [facility], [place] [address], [place], [place] [address]    Special Requests   Final    BOTTLES DRAWN AEROBIC AND ANAEROBIC Blood Culture adequate volume Performed at [place] [address] [place], [place]  [address]    Culture   Final    NO GROWTH 4 DAYS Performed at [facility], [place] [address] [place], [place] [address]    Report Status PENDING  Incomplete  Urine culture     Status: Abnormal   Collection Time: 08/17/17  2:16 PM  Result Value Ref Range Status   Specimen Description   Final    URINE, RANDOM Performed at [place] [address] [place], [place] [address]    Special Requests   Final    NONE Performed at [facility], [place] [address] [place],  [address]    Culture (A)  Final    <10,000 COLONIES/mL INSIGNIFICANT GROWTH Performed at [place] [address], [place],  [address]    Report Status 08/19/2017 FINAL  Final     Labs: Basic Metabolic Panel: Recent Labs  Lab 08/18/17 0520 08/19/17 0617 08/20/17 0517 08/21/17 0513 08/22/17 0505  NA 139 138 135 135 134*  K 4.4 4.2 4.0 4.4 4.5  CL 102 102 101 102 102  CO2 25 25 25 24 25   GLUCOSE 101* 102* 99 93 86  BUN 37* 37* 33* 32* 30*  CREATININE 1.74* 1.70* 1.55* 1.61* 1.34*  CALCIUM 9.2 8.6* 8.4* 8.7* 8.6*   CBC: Recent Labs  Lab 08/17/17 1243 08/19/17 0617 08/20/17 0517 08/21/17 0513 08/21/17 1633  WBC 7.3 8.8 8.2 7.0 7.4  NEUTROABS 4.9  --   --   --   --   HGB 11.0* 9.7* 9.6* 9.7* 9.4*  HCT 36.6 30.6* 30.1* 30.3* 30.2*  MCV 67.0* 65.5* 65.3* 65.4* 65.5*  PLT 245 167 168 169 193   BNP (last 3 results) Recent Labs    11/04/16 0952  BNP 499.2*    SIGNED: Time coordinating discharge: 50 minutes  Faye Ramsay, MD  [facility] 08/22/2017, 7:56 AM Pager [phone]  If 7PM-7AM, please contact night-coverage [email or web address] Password [email or web address]

## 2017-08-22 NOTE — Discharge Instructions (Signed)

## 2017-08-23 DIAGNOSIS — J45998 Other asthma: Secondary | ICD-10-CM | POA: Diagnosis not present

## 2017-08-23 DIAGNOSIS — I5041 Acute combined systolic (congestive) and diastolic (congestive) heart failure: Secondary | ICD-10-CM | POA: Diagnosis not present

## 2017-08-23 DIAGNOSIS — I130 Hypertensive heart and chronic kidney disease with heart failure and stage 1 through stage 4 chronic kidney disease, or unspecified chronic kidney disease: Secondary | ICD-10-CM | POA: Diagnosis not present

## 2017-08-23 DIAGNOSIS — Z96652 Presence of left artificial knee joint: Secondary | ICD-10-CM | POA: Diagnosis not present

## 2017-08-23 DIAGNOSIS — Z96641 Presence of right artificial hip joint: Secondary | ICD-10-CM | POA: Diagnosis not present

## 2017-08-23 DIAGNOSIS — R2689 Other abnormalities of gait and mobility: Secondary | ICD-10-CM | POA: Diagnosis not present

## 2017-08-23 DIAGNOSIS — H3530 Unspecified macular degeneration: Secondary | ICD-10-CM | POA: Diagnosis not present

## 2017-08-23 DIAGNOSIS — N184 Chronic kidney disease, stage 4 (severe): Secondary | ICD-10-CM | POA: Diagnosis not present

## 2017-08-23 DIAGNOSIS — D631 Anemia in chronic kidney disease: Secondary | ICD-10-CM | POA: Diagnosis not present

## 2017-08-23 DIAGNOSIS — I872 Venous insufficiency (chronic) (peripheral): Secondary | ICD-10-CM | POA: Diagnosis not present

## 2017-08-23 DIAGNOSIS — I2729 Other secondary pulmonary hypertension: Secondary | ICD-10-CM | POA: Diagnosis not present

## 2017-08-23 DIAGNOSIS — Z87891 Personal history of nicotine dependence: Secondary | ICD-10-CM | POA: Diagnosis not present

## 2017-08-28 DIAGNOSIS — R2689 Other abnormalities of gait and mobility: Secondary | ICD-10-CM | POA: Diagnosis not present

## 2017-08-28 DIAGNOSIS — D631 Anemia in chronic kidney disease: Secondary | ICD-10-CM | POA: Diagnosis not present

## 2017-08-28 DIAGNOSIS — N184 Chronic kidney disease, stage 4 (severe): Secondary | ICD-10-CM | POA: Diagnosis not present

## 2017-08-28 DIAGNOSIS — I130 Hypertensive heart and chronic kidney disease with heart failure and stage 1 through stage 4 chronic kidney disease, or unspecified chronic kidney disease: Secondary | ICD-10-CM | POA: Diagnosis not present

## 2017-08-28 DIAGNOSIS — I872 Venous insufficiency (chronic) (peripheral): Secondary | ICD-10-CM | POA: Diagnosis not present

## 2017-08-28 DIAGNOSIS — I5041 Acute combined systolic (congestive) and diastolic (congestive) heart failure: Secondary | ICD-10-CM | POA: Diagnosis not present

## 2017-08-31 DIAGNOSIS — D631 Anemia in chronic kidney disease: Secondary | ICD-10-CM | POA: Diagnosis not present

## 2017-08-31 DIAGNOSIS — R2689 Other abnormalities of gait and mobility: Secondary | ICD-10-CM | POA: Diagnosis not present

## 2017-08-31 DIAGNOSIS — N184 Chronic kidney disease, stage 4 (severe): Secondary | ICD-10-CM | POA: Diagnosis not present

## 2017-08-31 DIAGNOSIS — I872 Venous insufficiency (chronic) (peripheral): Secondary | ICD-10-CM | POA: Diagnosis not present

## 2017-08-31 DIAGNOSIS — I5041 Acute combined systolic (congestive) and diastolic (congestive) heart failure: Secondary | ICD-10-CM | POA: Diagnosis not present

## 2017-08-31 DIAGNOSIS — I130 Hypertensive heart and chronic kidney disease with heart failure and stage 1 through stage 4 chronic kidney disease, or unspecified chronic kidney disease: Secondary | ICD-10-CM | POA: Diagnosis not present

## 2017-09-01 DIAGNOSIS — I5041 Acute combined systolic (congestive) and diastolic (congestive) heart failure: Secondary | ICD-10-CM | POA: Diagnosis not present

## 2017-09-01 DIAGNOSIS — I872 Venous insufficiency (chronic) (peripheral): Secondary | ICD-10-CM | POA: Diagnosis not present

## 2017-09-01 DIAGNOSIS — N184 Chronic kidney disease, stage 4 (severe): Secondary | ICD-10-CM | POA: Diagnosis not present

## 2017-09-01 DIAGNOSIS — D631 Anemia in chronic kidney disease: Secondary | ICD-10-CM | POA: Diagnosis not present

## 2017-09-01 DIAGNOSIS — R2689 Other abnormalities of gait and mobility: Secondary | ICD-10-CM | POA: Diagnosis not present

## 2017-09-01 DIAGNOSIS — I130 Hypertensive heart and chronic kidney disease with heart failure and stage 1 through stage 4 chronic kidney disease, or unspecified chronic kidney disease: Secondary | ICD-10-CM | POA: Diagnosis not present

## 2017-09-02 DIAGNOSIS — I872 Venous insufficiency (chronic) (peripheral): Secondary | ICD-10-CM | POA: Diagnosis not present

## 2017-09-02 DIAGNOSIS — N184 Chronic kidney disease, stage 4 (severe): Secondary | ICD-10-CM | POA: Diagnosis not present

## 2017-09-02 DIAGNOSIS — I5041 Acute combined systolic (congestive) and diastolic (congestive) heart failure: Secondary | ICD-10-CM | POA: Diagnosis not present

## 2017-09-02 DIAGNOSIS — D631 Anemia in chronic kidney disease: Secondary | ICD-10-CM | POA: Diagnosis not present

## 2017-09-02 DIAGNOSIS — R2689 Other abnormalities of gait and mobility: Secondary | ICD-10-CM | POA: Diagnosis not present

## 2017-09-02 DIAGNOSIS — I130 Hypertensive heart and chronic kidney disease with heart failure and stage 1 through stage 4 chronic kidney disease, or unspecified chronic kidney disease: Secondary | ICD-10-CM | POA: Diagnosis not present

## 2017-09-03 DIAGNOSIS — D631 Anemia in chronic kidney disease: Secondary | ICD-10-CM | POA: Diagnosis not present

## 2017-09-03 DIAGNOSIS — I872 Venous insufficiency (chronic) (peripheral): Secondary | ICD-10-CM | POA: Diagnosis not present

## 2017-09-03 DIAGNOSIS — N184 Chronic kidney disease, stage 4 (severe): Secondary | ICD-10-CM | POA: Diagnosis not present

## 2017-09-03 DIAGNOSIS — I5041 Acute combined systolic (congestive) and diastolic (congestive) heart failure: Secondary | ICD-10-CM | POA: Diagnosis not present

## 2017-09-03 DIAGNOSIS — I130 Hypertensive heart and chronic kidney disease with heart failure and stage 1 through stage 4 chronic kidney disease, or unspecified chronic kidney disease: Secondary | ICD-10-CM | POA: Diagnosis not present

## 2017-09-03 DIAGNOSIS — R2689 Other abnormalities of gait and mobility: Secondary | ICD-10-CM | POA: Diagnosis not present

## 2017-09-06 DIAGNOSIS — R2689 Other abnormalities of gait and mobility: Secondary | ICD-10-CM | POA: Diagnosis not present

## 2017-09-06 DIAGNOSIS — N184 Chronic kidney disease, stage 4 (severe): Secondary | ICD-10-CM | POA: Diagnosis not present

## 2017-09-06 DIAGNOSIS — I5041 Acute combined systolic (congestive) and diastolic (congestive) heart failure: Secondary | ICD-10-CM | POA: Diagnosis not present

## 2017-09-06 DIAGNOSIS — I872 Venous insufficiency (chronic) (peripheral): Secondary | ICD-10-CM | POA: Diagnosis not present

## 2017-09-06 DIAGNOSIS — I130 Hypertensive heart and chronic kidney disease with heart failure and stage 1 through stage 4 chronic kidney disease, or unspecified chronic kidney disease: Secondary | ICD-10-CM | POA: Diagnosis not present

## 2017-09-06 DIAGNOSIS — D631 Anemia in chronic kidney disease: Secondary | ICD-10-CM | POA: Diagnosis not present

## 2017-09-07 DIAGNOSIS — N184 Chronic kidney disease, stage 4 (severe): Secondary | ICD-10-CM | POA: Diagnosis not present

## 2017-09-07 DIAGNOSIS — I872 Venous insufficiency (chronic) (peripheral): Secondary | ICD-10-CM | POA: Diagnosis not present

## 2017-09-07 DIAGNOSIS — I5041 Acute combined systolic (congestive) and diastolic (congestive) heart failure: Secondary | ICD-10-CM | POA: Diagnosis not present

## 2017-09-07 DIAGNOSIS — D631 Anemia in chronic kidney disease: Secondary | ICD-10-CM | POA: Diagnosis not present

## 2017-09-07 DIAGNOSIS — R2689 Other abnormalities of gait and mobility: Secondary | ICD-10-CM | POA: Diagnosis not present

## 2017-09-07 DIAGNOSIS — I130 Hypertensive heart and chronic kidney disease with heart failure and stage 1 through stage 4 chronic kidney disease, or unspecified chronic kidney disease: Secondary | ICD-10-CM | POA: Diagnosis not present

## 2017-09-09 DIAGNOSIS — H353221 Exudative age-related macular degeneration, left eye, with active choroidal neovascularization: Secondary | ICD-10-CM | POA: Diagnosis not present

## 2017-09-09 DIAGNOSIS — H353213 Exudative age-related macular degeneration, right eye, with inactive scar: Secondary | ICD-10-CM | POA: Diagnosis not present

## 2017-09-10 DIAGNOSIS — I872 Venous insufficiency (chronic) (peripheral): Secondary | ICD-10-CM | POA: Diagnosis not present

## 2017-09-10 DIAGNOSIS — D631 Anemia in chronic kidney disease: Secondary | ICD-10-CM | POA: Diagnosis not present

## 2017-09-10 DIAGNOSIS — N184 Chronic kidney disease, stage 4 (severe): Secondary | ICD-10-CM | POA: Diagnosis not present

## 2017-09-10 DIAGNOSIS — I130 Hypertensive heart and chronic kidney disease with heart failure and stage 1 through stage 4 chronic kidney disease, or unspecified chronic kidney disease: Secondary | ICD-10-CM | POA: Diagnosis not present

## 2017-09-10 DIAGNOSIS — R2689 Other abnormalities of gait and mobility: Secondary | ICD-10-CM | POA: Diagnosis not present

## 2017-09-10 DIAGNOSIS — I5041 Acute combined systolic (congestive) and diastolic (congestive) heart failure: Secondary | ICD-10-CM | POA: Diagnosis not present

## 2017-09-14 DIAGNOSIS — I5041 Acute combined systolic (congestive) and diastolic (congestive) heart failure: Secondary | ICD-10-CM | POA: Diagnosis not present

## 2017-09-14 DIAGNOSIS — N184 Chronic kidney disease, stage 4 (severe): Secondary | ICD-10-CM | POA: Diagnosis not present

## 2017-09-14 DIAGNOSIS — I130 Hypertensive heart and chronic kidney disease with heart failure and stage 1 through stage 4 chronic kidney disease, or unspecified chronic kidney disease: Secondary | ICD-10-CM | POA: Diagnosis not present

## 2017-09-14 DIAGNOSIS — R2689 Other abnormalities of gait and mobility: Secondary | ICD-10-CM | POA: Diagnosis not present

## 2017-09-14 DIAGNOSIS — I872 Venous insufficiency (chronic) (peripheral): Secondary | ICD-10-CM | POA: Diagnosis not present

## 2017-09-14 DIAGNOSIS — D631 Anemia in chronic kidney disease: Secondary | ICD-10-CM | POA: Diagnosis not present

## 2017-09-16 DIAGNOSIS — R2689 Other abnormalities of gait and mobility: Secondary | ICD-10-CM | POA: Diagnosis not present

## 2017-09-16 DIAGNOSIS — D631 Anemia in chronic kidney disease: Secondary | ICD-10-CM | POA: Diagnosis not present

## 2017-09-16 DIAGNOSIS — I5041 Acute combined systolic (congestive) and diastolic (congestive) heart failure: Secondary | ICD-10-CM | POA: Diagnosis not present

## 2017-09-16 DIAGNOSIS — I130 Hypertensive heart and chronic kidney disease with heart failure and stage 1 through stage 4 chronic kidney disease, or unspecified chronic kidney disease: Secondary | ICD-10-CM | POA: Diagnosis not present

## 2017-09-16 DIAGNOSIS — N184 Chronic kidney disease, stage 4 (severe): Secondary | ICD-10-CM | POA: Diagnosis not present

## 2017-09-16 DIAGNOSIS — I872 Venous insufficiency (chronic) (peripheral): Secondary | ICD-10-CM | POA: Diagnosis not present

## 2017-09-17 DIAGNOSIS — N184 Chronic kidney disease, stage 4 (severe): Secondary | ICD-10-CM | POA: Diagnosis not present

## 2017-09-17 DIAGNOSIS — I130 Hypertensive heart and chronic kidney disease with heart failure and stage 1 through stage 4 chronic kidney disease, or unspecified chronic kidney disease: Secondary | ICD-10-CM | POA: Diagnosis not present

## 2017-09-17 DIAGNOSIS — I872 Venous insufficiency (chronic) (peripheral): Secondary | ICD-10-CM | POA: Diagnosis not present

## 2017-09-17 DIAGNOSIS — I5041 Acute combined systolic (congestive) and diastolic (congestive) heart failure: Secondary | ICD-10-CM | POA: Diagnosis not present

## 2017-09-17 DIAGNOSIS — R2689 Other abnormalities of gait and mobility: Secondary | ICD-10-CM | POA: Diagnosis not present

## 2017-09-17 DIAGNOSIS — D631 Anemia in chronic kidney disease: Secondary | ICD-10-CM | POA: Diagnosis not present

## 2017-09-21 DIAGNOSIS — I872 Venous insufficiency (chronic) (peripheral): Secondary | ICD-10-CM | POA: Diagnosis not present

## 2017-09-21 DIAGNOSIS — R2689 Other abnormalities of gait and mobility: Secondary | ICD-10-CM | POA: Diagnosis not present

## 2017-09-21 DIAGNOSIS — I130 Hypertensive heart and chronic kidney disease with heart failure and stage 1 through stage 4 chronic kidney disease, or unspecified chronic kidney disease: Secondary | ICD-10-CM | POA: Diagnosis not present

## 2017-09-21 DIAGNOSIS — D631 Anemia in chronic kidney disease: Secondary | ICD-10-CM | POA: Diagnosis not present

## 2017-09-21 DIAGNOSIS — N184 Chronic kidney disease, stage 4 (severe): Secondary | ICD-10-CM | POA: Diagnosis not present

## 2017-09-21 DIAGNOSIS — I5041 Acute combined systolic (congestive) and diastolic (congestive) heart failure: Secondary | ICD-10-CM | POA: Diagnosis not present

## 2017-10-27 ENCOUNTER — Ambulatory Visit: Payer: Commercial Indemnity | Admitting: Podiatry

## 2017-11-04 DIAGNOSIS — H401122 Primary open-angle glaucoma, left eye, moderate stage: Secondary | ICD-10-CM | POA: Diagnosis not present

## 2017-11-04 DIAGNOSIS — H31011 Macula scars of posterior pole (postinflammatory) (post-traumatic), right eye: Secondary | ICD-10-CM | POA: Diagnosis not present

## 2017-11-04 DIAGNOSIS — H35362 Drusen (degenerative) of macula, left eye: Secondary | ICD-10-CM | POA: Diagnosis not present

## 2017-11-04 DIAGNOSIS — H353213 Exudative age-related macular degeneration, right eye, with inactive scar: Secondary | ICD-10-CM | POA: Diagnosis not present

## 2017-11-04 DIAGNOSIS — H353221 Exudative age-related macular degeneration, left eye, with active choroidal neovascularization: Secondary | ICD-10-CM | POA: Diagnosis not present

## 2017-11-04 DIAGNOSIS — Z961 Presence of intraocular lens: Secondary | ICD-10-CM | POA: Diagnosis not present

## 2017-11-10 ENCOUNTER — Ambulatory Visit (INDEPENDENT_AMBULATORY_CARE_PROVIDER_SITE_OTHER): Payer: Medicare Other | Admitting: Podiatry

## 2017-11-10 DIAGNOSIS — B351 Tinea unguium: Secondary | ICD-10-CM | POA: Diagnosis not present

## 2017-11-10 DIAGNOSIS — M79676 Pain in unspecified toe(s): Secondary | ICD-10-CM | POA: Diagnosis not present

## 2017-11-13 NOTE — Progress Notes (Signed)
   SUBJECTIVE Patient presents to office today complaining of elongated, thickened nails that cause pain while ambulating in shoes. She is unable to trim her own nails. Patient is here for further evaluation and treatment.  Past Medical History:  Diagnosis Date  . Anemia   . Asthma   . Cellulitis   . Chronic acquired lymphedema   . Chronic venous insufficiency   . Dysrhythmia   . Hyperlipidemia   . Hypertension     OBJECTIVE General Patient is awake, alert, and oriented x 3 and in no acute distress. Derm Skin is dry and supple bilateral. Negative open lesions or macerations. Remaining integument unremarkable. Nails are tender, long, thickened and dystrophic with subungual debris, consistent with onychomycosis, 1-5 bilateral. No signs of infection noted. Vasc  DP and PT pedal pulses palpable bilaterally. Temperature gradient within normal limits.  Neuro Epicritic and protective threshold sensation grossly intact bilaterally.  Musculoskeletal Exam No symptomatic pedal deformities noted bilateral. Muscular strength within normal limits.  ASSESSMENT 1. Onychodystrophic nails 1-5 bilateral with hyperkeratosis of nails.  2. Onychomycosis of nail due to dermatophyte bilateral 3. Pain in foot bilateral  PLAN OF CARE 1. Patient evaluated today.  2. Instructed to maintain good pedal hygiene and foot care.  3. Mechanical debridement of nails 1-5 bilaterally performed using a nail nipper. Filed with dremel without incident.  4. Return to clinic in 3 mos.    Edrick Kins, DPM [facility]  Dr. Edrick Kins, [place]                                        [place], [place] [address]                Office [phone]  Fax [phone]

## 2017-11-16 DIAGNOSIS — H6123 Impacted cerumen, bilateral: Secondary | ICD-10-CM | POA: Diagnosis not present

## 2017-12-03 DIAGNOSIS — M1711 Unilateral primary osteoarthritis, right knee: Secondary | ICD-10-CM | POA: Diagnosis not present

## 2017-12-17 ENCOUNTER — Other Ambulatory Visit: Payer: Self-pay | Admitting: *Deleted

## 2017-12-17 MED ORDER — DABIGATRAN ETEXILATE MESYLATE 75 MG PO CAPS: ORAL_CAPSULE | ORAL | 0 refills | Status: DC

## 2017-12-17 NOTE — Telephone Encounter (Signed)
Pt is a 84 yr old female. Last noted weight 85.5 Kg 09/04/16 when pt saw Dr Lovena Le. She does have a pending appt on 03/2018. Last noted SCr from 08/22/17 was 1.34. CrCl is 33 mL/min. Will continue Pradaxa 75mg  BID per Erasmo Downer D and ask Dr Lovena Le in Sept about Rx.

## 2018-01-20 DIAGNOSIS — H353213 Exudative age-related macular degeneration, right eye, with inactive scar: Secondary | ICD-10-CM | POA: Diagnosis not present

## 2018-01-20 DIAGNOSIS — H353221 Exudative age-related macular degeneration, left eye, with active choroidal neovascularization: Secondary | ICD-10-CM | POA: Diagnosis not present

## 2018-01-20 DIAGNOSIS — H35362 Drusen (degenerative) of macula, left eye: Secondary | ICD-10-CM | POA: Diagnosis not present

## 2018-02-23 DIAGNOSIS — Z6830 Body mass index (BMI) 30.0-30.9, adult: Secondary | ICD-10-CM | POA: Diagnosis not present

## 2018-02-23 DIAGNOSIS — I872 Venous insufficiency (chronic) (peripheral): Secondary | ICD-10-CM | POA: Diagnosis not present

## 2018-02-23 DIAGNOSIS — L03119 Cellulitis of unspecified part of limb: Secondary | ICD-10-CM | POA: Diagnosis not present

## 2018-02-23 DIAGNOSIS — I5032 Chronic diastolic (congestive) heart failure: Secondary | ICD-10-CM | POA: Diagnosis not present

## 2018-02-24 ENCOUNTER — Encounter: Payer: Self-pay | Admitting: Internal Medicine

## 2018-02-25 ENCOUNTER — Emergency Department (HOSPITAL_COMMUNITY): Payer: Medicare Other

## 2018-02-25 ENCOUNTER — Inpatient Hospital Stay (HOSPITAL_COMMUNITY)
Admission: EM | Admit: 2018-02-25 | Discharge: 2018-02-28 | DRG: 603 | Disposition: A | Discharge status: Home Health | Payer: Medicare Other | Attending: Internal Medicine | Admitting: Internal Medicine

## 2018-02-25 ENCOUNTER — Other Ambulatory Visit: Payer: Self-pay

## 2018-02-25 DIAGNOSIS — I130 Hypertensive heart and chronic kidney disease with heart failure and stage 1 through stage 4 chronic kidney disease, or unspecified chronic kidney disease: Secondary | ICD-10-CM | POA: Diagnosis present

## 2018-02-25 DIAGNOSIS — L03116 Cellulitis of left lower limb: Secondary | ICD-10-CM | POA: Diagnosis not present

## 2018-02-25 DIAGNOSIS — D509 Iron deficiency anemia, unspecified: Secondary | ICD-10-CM | POA: Diagnosis present

## 2018-02-25 DIAGNOSIS — I878 Other specified disorders of veins: Secondary | ICD-10-CM | POA: Diagnosis present

## 2018-02-25 DIAGNOSIS — E785 Hyperlipidemia, unspecified: Secondary | ICD-10-CM | POA: Diagnosis present

## 2018-02-25 DIAGNOSIS — I5042 Chronic combined systolic (congestive) and diastolic (congestive) heart failure: Secondary | ICD-10-CM | POA: Diagnosis not present

## 2018-02-25 DIAGNOSIS — R609 Edema, unspecified: Secondary | ICD-10-CM

## 2018-02-25 DIAGNOSIS — R600 Localized edema: Secondary | ICD-10-CM | POA: Diagnosis not present

## 2018-02-25 DIAGNOSIS — Z87891 Personal history of nicotine dependence: Secondary | ICD-10-CM

## 2018-02-25 DIAGNOSIS — Z91048 Other nonmedicinal substance allergy status: Secondary | ICD-10-CM

## 2018-02-25 DIAGNOSIS — Z801 Family history of malignant neoplasm of trachea, bronchus and lung: Secondary | ICD-10-CM

## 2018-02-25 DIAGNOSIS — L03115 Cellulitis of right lower limb: Principal | ICD-10-CM | POA: Diagnosis present

## 2018-02-25 DIAGNOSIS — Z96652 Presence of left artificial knee joint: Secondary | ICD-10-CM | POA: Diagnosis present

## 2018-02-25 DIAGNOSIS — Z96641 Presence of right artificial hip joint: Secondary | ICD-10-CM | POA: Diagnosis present

## 2018-02-25 DIAGNOSIS — I272 Pulmonary hypertension, unspecified: Secondary | ICD-10-CM | POA: Diagnosis present

## 2018-02-25 DIAGNOSIS — I872 Venous insufficiency (chronic) (peripheral): Secondary | ICD-10-CM | POA: Diagnosis present

## 2018-02-25 DIAGNOSIS — L0390 Cellulitis, unspecified: Secondary | ICD-10-CM | POA: Diagnosis not present

## 2018-02-25 DIAGNOSIS — J45909 Unspecified asthma, uncomplicated: Secondary | ICD-10-CM | POA: Diagnosis not present

## 2018-02-25 DIAGNOSIS — I4891 Unspecified atrial fibrillation: Secondary | ICD-10-CM | POA: Diagnosis present

## 2018-02-25 DIAGNOSIS — Z91040 Latex allergy status: Secondary | ICD-10-CM

## 2018-02-25 DIAGNOSIS — Z7902 Long term (current) use of antithrombotics/antiplatelets: Secondary | ICD-10-CM

## 2018-02-25 DIAGNOSIS — N184 Chronic kidney disease, stage 4 (severe): Secondary | ICD-10-CM | POA: Diagnosis present

## 2018-02-25 DIAGNOSIS — I5032 Chronic diastolic (congestive) heart failure: Secondary | ICD-10-CM | POA: Diagnosis present

## 2018-02-25 DIAGNOSIS — I10 Essential (primary) hypertension: Secondary | ICD-10-CM | POA: Diagnosis not present

## 2018-02-25 LAB — BRAIN NATRIURETIC PEPTIDE: B Natriuretic Peptide: 380.8 pg/mL — ABNORMAL HIGH (ref 0.0–100.0)

## 2018-02-25 LAB — BASIC METABOLIC PANEL
ANION GAP: 12 (ref 5–15)
BUN: 43 mg/dL — ABNORMAL HIGH (ref 8–23)
CO2: 24 mmol/L (ref 22–32)
Calcium: 9.4 mg/dL (ref 8.9–10.3)
Chloride: 105 mmol/L (ref 98–111)
Creatinine, Ser: 1.77 mg/dL — ABNORMAL HIGH (ref 0.44–1.00)
GFR calc non Af Amer: 23 mL/min — ABNORMAL LOW (ref >60)
GFR, EST AFRICAN AMERICAN: 27 mL/min — AB (ref >60)
Glucose, Bld: 113 mg/dL — ABNORMAL HIGH (ref 70–99)
POTASSIUM: 4.6 mmol/L (ref 3.5–5.1)
SODIUM: 141 mmol/L (ref 135–145)

## 2018-02-25 LAB — URINALYSIS, ROUTINE W REFLEX MICROSCOPIC
BILIRUBIN URINE: NEGATIVE
Glucose, UA: NEGATIVE mg/dL
HGB URINE DIPSTICK: NEGATIVE
Ketones, ur: NEGATIVE mg/dL
Leukocytes, UA: NEGATIVE
Nitrite: NEGATIVE
PH: 6 (ref 5.0–8.0)
Protein, ur: NEGATIVE mg/dL
SPECIFIC GRAVITY, URINE: 1.012 (ref 1.005–1.030)

## 2018-02-25 LAB — I-STAT CG4 LACTIC ACID, ED: Lactic Acid, Venous: 1.45 mmol/L (ref 0.5–1.9)

## 2018-02-25 LAB — I-STAT TROPONIN, ED: TROPONIN I, POC: 0.06 ng/mL (ref 0.00–0.08)

## 2018-02-25 MED ORDER — CEFAZOLIN SODIUM-DEXTROSE 1-4 GM/50ML-% IV SOLN
1.0000 g | Freq: Once | INTRAVENOUS | Status: AC
  Administered 2018-02-25: 1 g via INTRAVENOUS
  Filled 2018-02-25: qty 50

## 2018-02-25 NOTE — ED Notes (Signed)
Hospitalist at bedside 

## 2018-02-25 NOTE — ED Notes (Signed)
Bed: FW26 Expected date:  Expected time:  Means of arrival:  Comments: 90s F cellulitis

## 2018-02-25 NOTE — ED Provider Notes (Signed)
[facility] DEPT Provider Note   CSN: [id number] Arrival date & time: 02/25/18  2023     History   Chief Complaint Chief Complaint  Patient presents with  . Cellulitis    HPI BRIEONNA CRUTCHER is a 84 y.o. female.  HPI Patient has prior history of lower extremity cellulitis and edema.  She reports something like this seems to happen about twice a year.  She started getting some mild redness and swelling 2 nights ago.  She saw her doctor and was started on doxycycline and increased her Lasix dose yesterday.  Today however the redness became bright red and started going up her legs quickly.  Normally, she has no redness of the legs.  She does have some problems with chronic venous stasis but no persistent open wounds or severe swelling.  She reports normally her legs are about half the size that they are currently.  There is associated burning pain.  Patient has not had documented fever.  No nausea no vomiting.  No abdominal pain.  She reports she has been taking Lasix and has been making urine.  No chest pain or shortness of breath. Past Medical History:  Diagnosis Date  . Anemia   . Asthma   . Cellulitis   . Chronic acquired lymphedema   . Chronic venous insufficiency   . Dysrhythmia   . Hyperlipidemia   . Hypertension     Patient Active Problem List   Diagnosis Date Noted  . Lower extremity cellulitis 08/17/2017  . Chronic venous insufficiency 08/17/2017  . Hyperkalemia 08/17/2017  . CKD (chronic kidney disease), stage IV ([place]) 08/17/2017  . Pressure injury of skin 11/06/2016  . Moderate mitral regurgitation 11/04/2016  . Elevated brain natriuretic peptide (BNP) level 11/04/2016  . Pulmonary HTN ([place]) 11/04/2016  . Acute kidney injury ([place]) 11/04/2016  . Chest pain 11/04/2016  . HLD (hyperlipidemia) 11/04/2016  . Acute systolic heart failure ([place]) 11/04/2016  . Acute exacerbation of CHF (congestive heart failure) ([place]) 11/04/2016  .  Atypical chest pain   . Acute diastolic CHF (congestive heart failure) ([place])   . Lobar pneumonia ([place])   . Hyponatremia 07/13/2012    Class: Acute  . Essential hypertension 03/31/2012  . Cellulitis of leg 11/11/2011    Class: Acute  . Atrial fibrillation ([place]) 07/29/2011  . Peripheral edema 07/29/2011  . Cough 06/26/2010  . ANEMIA, IRON DEFICIENCY 06/10/2010  . ANEMIA-UNSPECIFIED 06/10/2010  . ASTHMA UNSPECIFIED 06/10/2010  . PERSONAL HX COLONIC POLYPS 06/10/2010    Past Surgical History:  Procedure Laterality Date  . JOINT REPLACEMENT    . left total knee replacement    . right total hip replacement       OB History   None      Home Medications    Prior to Admission medications   Medication Sig Start Date End Date Taking? Authorizing Provider  albuterol (PROVENTIL HFA) 108 (90 Base) MCG/ACT inhaler Inhale 2 puffs into the lungs every 6 (six) hours as needed for wheezing or shortness of breath.    [provider]  dabigatran (PRADAXA) 75 MG CAPS capsule TAKE 1 CAPSULE EVERY 12 HOURS 12/17/17   Evans Lance, MD  dorzolamide-timolol (COSOPT) 22.3-6.8 MG/ML ophthalmic solution INSTILL 1 DROP IN THE LEFT EYE TWICE A DAY 11/25/16   [provider]  doxycycline (VIBRA-TABS) 100 MG tablet Take 1 tablet (100 mg total) by mouth every 12 (twelve) hours. 08/22/17   Theodis Blaze, MD  furosemide (LASIX)  20 MG tablet Take 40mg  PO daily, take an extra 20mg  Pill if your weight is over 2 pounds in 24hrs or your notice extra fluid in your legs. Patient taking differently: Take 20-40 mg by mouth See admin instructions. Take 40mg  PO daily, take an extra 20mg  Pill if your weight is over 2 pounds in 24hrs or your notice extra fluid in your legs. 11/07/16   Thurnell Lose, MD  loratadine (CLARITIN) 10 MG tablet Take 10 mg by mouth at bedtime.    [provider]  metoprolol tartrate (LOPRESSOR) 25 MG tablet Take 1 tablet (25 mg total) by mouth 2 (two) times daily.  11/27/16   Evans Lance, MD  omeprazole (PRILOSEC) 40 MG capsule Take 40 mg by mouth 2 (two) times daily.     [provider]  Probiotic Product (ALIGN PO) Take 1 capsule by mouth 2 (two) times daily.     [provider]  traMADol (ULTRAM) 50 MG tablet Take 1 tablet (50 mg total) by mouth daily as needed for moderate pain or severe pain. 08/22/17   Theodis Blaze, MD  Travoprost, BAK Free, (TRAVATAN Z) 0.004 % SOLN ophthalmic solution INSTILL 1 DROP INTO THE LEFT EYE NIGHTLY 11/25/16   [provider]  vitamin C (ASCORBIC ACID) 500 MG tablet Take 500 mg by mouth daily.     [provider]  triamterene-hydrochlorothiazide (MAXZIDE-25) 37.5-25 MG per tablet Take 1 tablet by mouth daily.  09/29/11  [provider]    Family History Family History  Problem Relation Age of Onset  . Lung cancer Mother   . Other Father        unknown causes  . Breast cancer Neg Hx   . Diabetes Neg Hx     Social History Social History   Tobacco Use  . Smoking status: Former Smoker    Last attempt to quit: 07/28/1959    Years since quitting: 58.6  . Smokeless tobacco: Never Used  Substance Use Topics  . Alcohol use: No  . Drug use: No     Allergies   Dust mite extract; Mold extract [trichophyton mentagrophyte]; Latex; and Tape   Review of Systems Review of Systems 10 Systems reviewed and are negative for acute change except as noted in the HPI.   Physical Exam Updated Vital Signs BP (!) 124/98 (BP Location: Right Arm)   Pulse 97   Resp 16   Ht 5\' 2"  (1.575 m)   Wt 80.7 kg   SpO2 97%   BMI 32.56 kg/m   Physical Exam  Constitutional: She is oriented to person, place, and time. She appears well-developed and well-nourished. No distress.  Patient is in very good condition and mental status for age.  Alert and appropriate.  HENT:  Head: Normocephalic and atraumatic.  Eyes: EOM are normal.  Cardiovascular:  Heart irregularly irregular.  No gross  rub murmur gallop.  Pulmonary/Chest: Effort normal and breath sounds normal.  Abdominal: Soft. She exhibits no distension. There is no tenderness. There is no guarding.  Musculoskeletal:  2+ pitting edema bilateral lower extremities.  Right erythema bilateral lower extremities to below the knees.  Bilateral dorsalis pedis pulses 2+.  See attached images  Neurological: She is alert and oriented to person, place, and time. She exhibits normal muscle tone. Coordination normal.  Skin: Skin is warm and dry.  Psychiatric: She has a normal mood and affect.       ED Treatments / Results  Labs (all labs  ordered are listed, but only abnormal results are displayed) Labs Reviewed  BASIC METABOLIC PANEL - Abnormal; Notable for the following components:      Result Value   Glucose, Bld 113 (*)    BUN 43 (*)    Creatinine, Ser 1.77 (*)    GFR calc non Af Amer 23 (*)    GFR calc Af Amer 27 (*)    All other components within normal limits  BRAIN NATRIURETIC PEPTIDE - Abnormal; Notable for the following components:   B Natriuretic Peptide 380.8 (*)    All other components within normal limits  CULTURE, BLOOD (ROUTINE X 2)  CULTURE, BLOOD (ROUTINE X 2)  URINALYSIS, ROUTINE W REFLEX MICROSCOPIC  CBC WITH DIFFERENTIAL/PLATELET  I-STAT TROPONIN, ED  I-STAT CG4 LACTIC ACID, ED    EKG None  Radiology Dg Chest 2 View  Result Date: 02/25/2018 CLINICAL DATA:  Lower extremity edema EXAM: CHEST - 2 VIEW COMPARISON:  11/05/2016 chest radiograph. FINDINGS: Stable cardiomediastinal silhouette with mild cardiomegaly. No pneumothorax. No pleural effusion. Cephalization of the pulmonary vasculature. No overt pulmonary edema. No acute consolidative airspace disease. IMPRESSION: Stable cardiomegaly without overt pulmonary edema. Electronically Signed   By: Ilona Sorrel M.D.   On: 02/25/2018 21:53    Procedures Procedures (including critical care time)  Medications Ordered in ED Medications  ceFAZolin  (ANCEF) IVPB 1 g/50 mL premix (0 g Intravenous Stopped 02/25/18 2237)     Initial Impression / Assessment and Plan / ED Course  I have reviewed the triage vital signs and the nursing notes.  Pertinent labs & imaging results that were available during my care of the patient were reviewed by me and considered in my medical decision making (see chart for details).    Consult: Dr. Myna Hidalgo [facility] hospital[facility]  Final Clinical Impressions(s) / ED Diagnoses   Final diagnoses:  Cellulitis of left lower extremity  Cellulitis of right lower extremity  Peripheral edema    ED Discharge Orders    None       Charlesetta Shanks, MD 02/26/18 0003

## 2018-02-25 NOTE — ED Triage Notes (Signed)
Pt to ED by [facility] with c/o of cellulitis. Pt has visible cellulitis to both of her lower extremities. Pt states the redness began on Wed morning and has progressively gotten worse. Pt is A&O x4 and ambulatory with a walker. Pt has had cellulitis bilaterally before and went to PCP yesterday and was given Doxycyline 100 mg bid. Pt states that the oral antibiotics weren't working as fast as the cellulitis was moving.

## 2018-02-26 ENCOUNTER — Encounter (HOSPITAL_COMMUNITY): Payer: Self-pay | Admitting: Family Medicine

## 2018-02-26 ENCOUNTER — Other Ambulatory Visit: Payer: Self-pay

## 2018-02-26 DIAGNOSIS — L0390 Cellulitis, unspecified: Secondary | ICD-10-CM | POA: Diagnosis present

## 2018-02-26 DIAGNOSIS — I481 Persistent atrial fibrillation: Secondary | ICD-10-CM | POA: Diagnosis not present

## 2018-02-26 DIAGNOSIS — I872 Venous insufficiency (chronic) (peripheral): Secondary | ICD-10-CM

## 2018-02-26 DIAGNOSIS — N184 Chronic kidney disease, stage 4 (severe): Secondary | ICD-10-CM | POA: Diagnosis not present

## 2018-02-26 DIAGNOSIS — L03119 Cellulitis of unspecified part of limb: Secondary | ICD-10-CM

## 2018-02-26 DIAGNOSIS — I5032 Chronic diastolic (congestive) heart failure: Secondary | ICD-10-CM | POA: Diagnosis present

## 2018-02-26 LAB — CBC WITH DIFFERENTIAL/PLATELET
BASOS ABS: 0.1 10*3/uL (ref 0.0–0.1)
Basophils Relative: 2 %
EOS PCT: 4 %
Eosinophils Absolute: 0.2 10*3/uL (ref 0.0–0.7)
HEMATOCRIT: 34.4 % — AB (ref 36.0–46.0)
Hemoglobin: 10.5 g/dL — ABNORMAL LOW (ref 12.0–15.0)
LYMPHS ABS: 0.8 10*3/uL (ref 0.7–4.0)
Lymphocytes Relative: 15 %
MCH: 20 pg — ABNORMAL LOW (ref 26.0–34.0)
MCHC: 30.5 g/dL (ref 30.0–36.0)
MCV: 65.6 fL — AB (ref 78.0–100.0)
MONOS PCT: 14 %
Monocytes Absolute: 0.8 10*3/uL (ref 0.1–1.0)
NEUTROS ABS: 3.5 10*3/uL (ref 1.7–7.7)
Neutrophils Relative %: 65 %
Platelets: 196 10*3/uL (ref 150–400)
RBC: 5.24 MIL/uL — ABNORMAL HIGH (ref 3.87–5.11)
RDW: 16.3 % — AB (ref 11.5–15.5)
WBC: 5.4 10*3/uL (ref 4.0–10.5)

## 2018-02-26 LAB — BASIC METABOLIC PANEL
Anion gap: 6 (ref 5–15)
BUN: 39 mg/dL — AB (ref 8–23)
CALCIUM: 9.1 mg/dL (ref 8.9–10.3)
CHLORIDE: 108 mmol/L (ref 98–111)
CO2: 27 mmol/L (ref 22–32)
CREATININE: 1.6 mg/dL — AB (ref 0.44–1.00)
GFR calc Af Amer: 30 mL/min — ABNORMAL LOW (ref >60)
GFR calc non Af Amer: 26 mL/min — ABNORMAL LOW (ref >60)
Glucose, Bld: 93 mg/dL (ref 70–99)
Potassium: 4.3 mmol/L (ref 3.5–5.1)
SODIUM: 141 mmol/L (ref 135–145)

## 2018-02-26 MED ORDER — CEFAZOLIN SODIUM-DEXTROSE 1-4 GM/50ML-% IV SOLN
1.0000 g | Freq: Two times a day (BID) | INTRAVENOUS | Status: DC
  Administered 2018-02-26 – 2018-02-28 (×5): 1 g via INTRAVENOUS
  Filled 2018-02-26 (×5): qty 50

## 2018-02-26 MED ORDER — PANTOPRAZOLE SODIUM 40 MG PO TBEC
40.0000 mg | DELAYED_RELEASE_TABLET | Freq: Every day | ORAL | Status: DC
  Administered 2018-02-26 – 2018-02-28 (×2): 40 mg via ORAL
  Filled 2018-02-26 (×3): qty 1

## 2018-02-26 MED ORDER — DORZOLAMIDE HCL-TIMOLOL MAL 2-0.5 % OP SOLN
1.0000 [drp] | Freq: Two times a day (BID) | OPHTHALMIC | Status: DC
  Administered 2018-02-26 – 2018-02-28 (×4): 1 [drp] via OPHTHALMIC
  Filled 2018-02-26: qty 10

## 2018-02-26 MED ORDER — FUROSEMIDE 40 MG PO TABS
40.0000 mg | ORAL_TABLET | Freq: Every day | ORAL | Status: DC
  Administered 2018-02-26 – 2018-02-28 (×3): 40 mg via ORAL
  Filled 2018-02-26 (×3): qty 1

## 2018-02-26 MED ORDER — DABIGATRAN ETEXILATE MESYLATE 75 MG PO CAPS
75.0000 mg | ORAL_CAPSULE | Freq: Two times a day (BID) | ORAL | Status: DC
  Administered 2018-02-26 – 2018-02-28 (×5): 75 mg via ORAL
  Filled 2018-02-26 (×5): qty 1

## 2018-02-26 MED ORDER — ONDANSETRON HCL 4 MG/2ML IJ SOLN: 4.0000 mg | Freq: Four times a day (QID) | INTRAMUSCULAR | Status: DC | PRN

## 2018-02-26 MED ORDER — SODIUM CHLORIDE 0.9 % IV SOLN
250.0000 mL | INTRAVENOUS | Status: DC | PRN
  Administered 2018-02-28: 250 mL via INTRAVENOUS

## 2018-02-26 MED ORDER — SODIUM CHLORIDE 0.9% FLUSH: 3.0000 mL | INTRAVENOUS | Status: DC | PRN

## 2018-02-26 MED ORDER — HEPARIN SODIUM (PORCINE) 5000 UNIT/ML IJ SOLN: 5000.0000 [IU] | Freq: Three times a day (TID) | INTRAMUSCULAR | Status: DC

## 2018-02-26 MED ORDER — ACETAMINOPHEN 325 MG PO TABS: 650.0000 mg | ORAL_TABLET | Freq: Four times a day (QID) | ORAL | Status: DC | PRN

## 2018-02-26 MED ORDER — RISAQUAD PO CAPS
1.0000 | ORAL_CAPSULE | Freq: Two times a day (BID) | ORAL | Status: DC
  Administered 2018-02-26 – 2018-02-28 (×5): 1 via ORAL
  Filled 2018-02-26 (×5): qty 1

## 2018-02-26 MED ORDER — LATANOPROST 0.005 % OP SOLN
1.0000 [drp] | Freq: Every day | OPHTHALMIC | Status: DC
  Administered 2018-02-26 – 2018-02-27 (×2): 1 [drp] via OPHTHALMIC
  Filled 2018-02-26: qty 2.5

## 2018-02-26 MED ORDER — ACETAMINOPHEN 650 MG RE SUPP: 650.0000 mg | Freq: Four times a day (QID) | RECTAL | Status: DC | PRN

## 2018-02-26 MED ORDER — ONDANSETRON HCL 4 MG PO TABS: 4.0000 mg | ORAL_TABLET | Freq: Four times a day (QID) | ORAL | Status: DC | PRN

## 2018-02-26 MED ORDER — ALBUTEROL SULFATE (2.5 MG/3ML) 0.083% IN NEBU: 3.0000 mL | INHALATION_SOLUTION | Freq: Four times a day (QID) | RESPIRATORY_TRACT | Status: DC | PRN

## 2018-02-26 MED ORDER — TRAMADOL HCL 50 MG PO TABS: 50.0000 mg | ORAL_TABLET | Freq: Every day | ORAL | Status: DC | PRN

## 2018-02-26 MED ORDER — TRIAMCINOLONE ACETONIDE 0.1 % EX OINT
TOPICAL_OINTMENT | Freq: Three times a day (TID) | CUTANEOUS | Status: DC
  Administered 2018-02-26: 13:00:00 via TOPICAL
  Administered 2018-02-26: 1 via TOPICAL
  Filled 2018-02-26: qty 80
  Filled 2018-02-26: qty 15
  Filled 2018-02-26: qty 80

## 2018-02-26 MED ORDER — METOPROLOL TARTRATE 25 MG PO TABS
25.0000 mg | ORAL_TABLET | Freq: Two times a day (BID) | ORAL | Status: DC
  Administered 2018-02-26 – 2018-02-27 (×2): 25 mg via ORAL
  Filled 2018-02-26 (×5): qty 1

## 2018-02-26 MED ORDER — SENNOSIDES-DOCUSATE SODIUM 8.6-50 MG PO TABS: 1.0000 | ORAL_TABLET | Freq: Every evening | ORAL | Status: DC | PRN

## 2018-02-26 MED ORDER — SODIUM CHLORIDE 0.9% FLUSH
3.0000 mL | Freq: Two times a day (BID) | INTRAVENOUS | Status: DC
  Administered 2018-02-26 – 2018-02-28 (×4): 3 mL via INTRAVENOUS

## 2018-02-26 NOTE — ED Notes (Signed)
ED TO INPATIENT HANDOFF REPORT  Name/Age/Gender Lori [name] 84 y.o. female  Code Status    Code Status Orders  (From admission, onward)         Start     Ordered   02/26/18 0007  Full code  Continuous     02/26/18 0009        Code Status History    Date Active Date Inactive Code Status Order ID Comments User Context   08/17/2017 1628 08/22/2017 1329 Full Code [id number]  Grunz, Ryan B, MD ED   08/17/2017 1628 08/17/2017 1628 DNR [id number]  Grunz, Ryan B, MD ED   08/17/2017 1628 08/17/2017 1628 Full Code [id number]  Grunz, Ryan B, MD ED   11/04/2016 1825 11/07/2016 1741 DNR [id number]  Ellis, Allison L, NP Inpatient   11/04/2016 1407 11/04/2016 1825 DNR [id number]  Campos, Kevin, MD ED   07/09/2012 2036 07/13/2012 1502 DNR [id number]  South, Stephen Alan, MD ED    Advance Directive Documentation     Most Recent Value  Type of Advance Directive  Healthcare Power of Attorney, Living will  Pre-existing out of facility DNR order (yellow form or pink MOST form)  -  "MOST" Form in Place?  -      Home/SNF/Other Home  Chief Complaint Cellulitus  Level of Care/Admitting Diagnosis ED Disposition    ED Disposition Condition Comment   Admit  Hospital Area: [facility] [[id number]]  Level of Care: Med-Surg [16]  Diagnosis: Cellulitis [192319]  Admitting Physician: OPYD, TIMOTHY S [[id number]]  Attending Physician: OPYD, TIMOTHY S [[id number]]  PT Class (Do Not Modify): Observation [104]  PT Acc Code (Do Not Modify): Observation [10022]       Medical History Past Medical History:  Diagnosis Date  . Anemia   . Asthma   . Cellulitis   . Chronic acquired lymphedema   . Chronic venous insufficiency   . Dysrhythmia   . Hyperlipidemia   . Hypertension     Allergies Allergies  Allergen Reactions  . Dust Mite Extract Shortness Of Breath and Itching  . Mold Extract [Trichophyton Mentagrophyte] Shortness Of Breath, Itching and Cough    VERY ALLERGIC/EXPOSURE RESULTS IN  TREMENDOUS COUGHING (just one symptom)  . Latex Itching    And certain fabrics  . Tape Other (See Comments)    SKIN IS VERY THIN AND WILL TEAR AND BRUISE EASILY!! PLEASE USE COBAN WRAP-    IV Location/Drains/Wounds Patient Lines/Drains/Airways Status   Active Line/Drains/Airways    Name:   Placement date:   Placement time:   Site:   Days:   Peripheral IV 02/25/18 Left Forearm   02/25/18    2134    Forearm   1   Pressure Injury 11/04/16 Stage II -  Partial thickness loss of dermis presenting as a shallow open ulcer with a red, pink wound bed without slough. 0.25 x 0.5 CM stage 2. Foam applied   11/04/16    2130     479   Wound / Incision (Open or Dehisced) 08/17/17 Other (Comment) Buttocks Right;Mid stage II , single linear healing wound. no drainage   08/17/17    1800    Buttocks   193          Labs/Imaging Results for orders placed or performed during the hospital encounter of 02/25/18 (from the past 48 hour(s))  Urinalysis, Routine w reflex microscopic     Status: None   Collection Time: 02/25/18  9:01 PM  Result Value Ref   Range   Color, Urine YELLOW YELLOW   APPearance CLEAR CLEAR   Specific Gravity, Urine 1.012 1.005 - 1.030   pH 6.0 5.0 - 8.0   Glucose, UA NEGATIVE NEGATIVE mg/dL   Hgb urine dipstick NEGATIVE NEGATIVE   Bilirubin Urine NEGATIVE NEGATIVE   Ketones, ur NEGATIVE NEGATIVE mg/dL   Protein, ur NEGATIVE NEGATIVE mg/dL   Nitrite NEGATIVE NEGATIVE   Leukocytes, UA NEGATIVE NEGATIVE    Comment: Performed at [facility], [address] [place], [place] [address]  Basic metabolic panel     Status: Abnormal   Collection Time: 02/25/18  9:26 PM  Result Value Ref Range   Sodium 141 135 - 145 mmol/L   Potassium 4.6 3.5 - 5.1 mmol/L   Chloride 105 98 - 111 mmol/L   CO2 24 22 - 32 mmol/L   Glucose, Bld 113 (H) 70 - 99 mg/dL   BUN 43 (H) 8 - 23 mg/dL   Creatinine, Ser 1.77 (H) 0.44 - 1.00 mg/dL   Calcium 9.4 8.9 - 10.3 mg/dL   GFR calc non Af  Amer 23 (L) >60 mL/min   GFR calc Af Amer 27 (L) >60 mL/min    Comment: (NOTE) The eGFR has been calculated using the CKD EPI equation. This calculation has not been validated in all clinical situations. eGFR's persistently <60 mL/min signify possible Chronic Kidney Disease.    Anion gap 12 5 - 15    Comment: Performed at [facility], [address] [place], [place] [address]  I-stat troponin, ED     Status: None   Collection Time: 02/25/18  9:35 PM  Result Value Ref Range   Troponin i, poc 0.06 0.00 - 0.08 ng/mL   Comment 3            Comment: Due to the release kinetics of cTnI, a negative result within the first hours of the onset of symptoms does not rule out myocardial infarction with certainty. If myocardial infarction is still suspected, repeat the test at appropriate intervals.   I-Stat CG4 Lactic Acid, ED     Status: None   Collection Time: 02/25/18  9:37 PM  Result Value Ref Range   Lactic Acid, Venous 1.45 0.5 - 1.9 mmol/L  Brain natriuretic peptide     Status: Abnormal   Collection Time: 02/25/18 10:39 PM  Result Value Ref Range   B Natriuretic Peptide 380.8 (H) 0.0 - 100.0 pg/mL    Comment: Performed at [facility], [address] [place], [place] [address]  CBC with Differential/Platelet     Status: Abnormal (Preliminary result)   Collection Time: 02/25/18 11:26 PM  Result Value Ref Range   WBC 5.4 4.0 - 10.5 K/uL   RBC 5.24 (H) 3.87 - 5.11 MIL/uL   Hemoglobin 10.5 (L) 12.0 - 15.0 g/dL   HCT 34.4 (L) 36.0 - 46.0 %   MCV 65.6 (L) 78.0 - 100.0 fL   MCH 20.0 (L) 26.0 - 34.0 pg   MCHC 30.5 30.0 - 36.0 g/dL   RDW 16.3 (H) 11.5 - 15.5 %   Platelets 196 150 - 400 K/uL    Comment: Performed at [facility], [address] [place], [place] [address]   Neutrophils Relative % PENDING %   Neutro Abs PENDING 1.7 - 7.7 K/uL   Band Neutrophils PENDING %   Lymphocytes Relative PENDING %   Lymphs Abs PENDING  0.7 - 4.0 K/uL   Monocytes Relative PENDING %     Monocytes Absolute PENDING 0.1 - 1.0 K/uL   Eosinophils Relative PENDING %   Eosinophils Absolute PENDING 0.0 - 0.7 K/uL   Basophils Relative PENDING %   Basophils Absolute PENDING 0.0 - 0.1 K/uL   WBC Morphology PENDING    RBC Morphology PENDING    Smear Review PENDING    nRBC PENDING 0 /100 WBC   Metamyelocytes Relative PENDING %   Myelocytes PENDING %   Promyelocytes Relative PENDING %   Blasts PENDING %   Dg Chest 2 View  Result Date: 02/25/2018 CLINICAL DATA:  Lower extremity edema EXAM: CHEST - 2 VIEW COMPARISON:  11/05/2016 chest radiograph. FINDINGS: Stable cardiomediastinal silhouette with mild cardiomegaly. No pneumothorax. No pleural effusion. Cephalization of the pulmonary vasculature. No overt pulmonary edema. No acute consolidative airspace disease. IMPRESSION: Stable cardiomegaly without overt pulmonary edema. Electronically Signed   By: Ilona Sorrel M.D.   On: 02/25/2018 21:53    Pending Labs Unresulted Labs (From admission, onward)    Start     Ordered   02/26/18 1975  Basic metabolic panel  Tomorrow morning,   R     02/26/18 0009   02/25/18 2058  Culture, blood (routine x 2)  BLOOD CULTURE X 2,   STAT     02/25/18 2100          Vitals/Pain Today's Vitals   02/25/18 2040 02/25/18 2247 02/25/18 2300 02/26/18 0000  BP:  (!) 124/98 138/72 109/65  Pulse:  97 (!) 124 79  Resp:  16 (!) 23 (!) 21  SpO2:  97% 98% 96%  Weight:      Height:      PainSc: 3        Isolation Precautions No active isolations  Medications Medications  traMADol (ULTRAM) tablet 50 mg (has no administration in time range)  furosemide (LASIX) tablet 40 mg (has no administration in time range)  metoprolol tartrate (LOPRESSOR) tablet 25 mg (has no administration in time range)  pantoprazole (PROTONIX) EC tablet 40 mg (has no administration in time range)  ALIGN CAPS 4 mg (has no administration in time range)  dabigatran (PRADAXA)  capsule 75 mg (has no administration in time range)  albuterol (PROVENTIL) (2.5 MG/3ML) 0.083% nebulizer solution 3 mL (has no administration in time range)  dorzolamide-timolol (COSOPT) 22.3-6.8 MG/ML ophthalmic solution 1 drop (has no administration in time range)  latanoprost (XALATAN) 0.005 % ophthalmic solution 1 drop (has no administration in time range)  sodium chloride flush (NS) 0.9 % injection 3 mL (has no administration in time range)  sodium chloride flush (NS) 0.9 % injection 3 mL (has no administration in time range)  0.9 %  sodium chloride infusion (has no administration in time range)  acetaminophen (TYLENOL) tablet 650 mg (has no administration in time range)    Or  acetaminophen (TYLENOL) suppository 650 mg (has no administration in time range)  senna-docusate (Senokot-S) tablet 1 tablet (has no administration in time range)  ondansetron (ZOFRAN) tablet 4 mg (has no administration in time range)    Or  ondansetron (ZOFRAN) injection 4 mg (has no administration in time range)  ceFAZolin (ANCEF) IVPB 1 g/50 mL premix (has no administration in time range)  triamcinolone ointment (KENALOG) 0.1 % (has no administration in time range)  ceFAZolin (ANCEF) IVPB 1 g/50 mL premix (0 g Intravenous Stopped 02/25/18 2237)    Mobility walks with device

## 2018-02-26 NOTE — H&P (Signed)
History and Physical    Lori [name] JJH:[id number] DOB: 1919-01-06 DOA: 02/25/2018  PCP: Leanna Battles, MD   Patient coming from: Home   Chief Complaint: "I have cellulitis"   HPI: Lori [name] is a 84 y.o. female with medical history significant for atrial fibrillation on Pradaxa, chronic venous stasis, chronic systolic CHF, now presenting to the emergency department for evaluation of bilateral lower extremity erythema and swelling that has not improved with doxycycline at home.  She was seen by her PCP yesterday for these complaints, had her Lasix dose increased and was started on doxycycline.  She reports that the redness and swelling has continued to increase despite this and she comes into the ED for evaluation.  She denies fevers or chills, denies chest pain, and denies any shortness of breath.  ED Course: Upon arrival to the ED, patient is found to be afebrile, saturating well on room air, and with vitals otherwise stable.  Chemistry panel is notable for a creatinine 1.77, similar to priors.  Lactic acid is normal.  CBC features a chronic stable microcytic anemia without leukocytosis.  Patient was treated with Ancef in the emergency department and hospitalist were asked to admit for ongoing evaluation and management of venous stasis dermatitis with concern for superimposed cellulitis.  Review of Systems:  All other systems reviewed and apart from HPI, are negative.  Past Medical History:  Diagnosis Date  . Anemia   . Asthma   . Cellulitis   . Chronic acquired lymphedema   . Chronic venous insufficiency   . Dysrhythmia   . Hyperlipidemia   . Hypertension     Past Surgical History:  Procedure Laterality Date  . JOINT REPLACEMENT    . left total knee replacement    . right total hip replacement       reports that she quit smoking about 58 years ago. She has never used smokeless tobacco. She reports that she does not drink alcohol or use drugs.  Allergies    Allergen Reactions  . Dust Mite Extract Shortness Of Breath and Itching  . Mold Extract [Trichophyton Mentagrophyte] Shortness Of Breath, Itching and Cough    VERY ALLERGIC/EXPOSURE RESULTS IN TREMENDOUS COUGHING (just one symptom)  . Latex Itching    And certain fabrics  . Tape Other (See Comments)    SKIN IS VERY THIN AND WILL TEAR AND BRUISE EASILY!! PLEASE USE COBAN WRAP-    Family History  Problem Relation Age of Onset  . Lung cancer Mother   . Other Father        unknown causes  . Breast cancer Neg Hx   . Diabetes Neg Hx      Prior to Admission medications   Medication Sig Start Date End Date Taking? Authorizing Provider  albuterol (PROVENTIL HFA) 108 (90 Base) MCG/ACT inhaler Inhale 2 puffs into the lungs every 6 (six) hours as needed for wheezing or shortness of breath.    [provider]  dabigatran (PRADAXA) 75 MG CAPS capsule TAKE 1 CAPSULE EVERY 12 HOURS 12/17/17   Evans Lance, MD  dorzolamide-timolol (COSOPT) 22.3-6.8 MG/ML ophthalmic solution INSTILL 1 DROP IN THE LEFT EYE TWICE A DAY 11/25/16   [provider]  doxycycline (VIBRA-TABS) 100 MG tablet Take 1 tablet (100 mg total) by mouth every 12 (twelve) hours. 08/22/17   Theodis Blaze, MD  furosemide (LASIX) 20 MG tablet Take 40mg  PO daily, take an extra 20mg  Pill if your weight is over 2 pounds in  24hrs or your notice extra fluid in your legs. Patient taking differently: Take 20-40 mg by mouth See admin instructions. Take 40mg  PO daily, take an extra 20mg  Pill if your weight is over 2 pounds in 24hrs or your notice extra fluid in your legs. 11/07/16   Thurnell Lose, MD  loratadine (CLARITIN) 10 MG tablet Take 10 mg by mouth at bedtime.    [provider]  metoprolol tartrate (LOPRESSOR) 25 MG tablet Take 1 tablet (25 mg total) by mouth 2 (two) times daily. 11/27/16   Evans Lance, MD  omeprazole (PRILOSEC) 40 MG capsule Take 40 mg by mouth 2 (two) times daily.     [provider]  Probiotic Product (ALIGN PO) Take 1 capsule by mouth 2 (two) times daily.     [provider]  traMADol (ULTRAM) 50 MG tablet Take 1 tablet (50 mg total) by mouth daily as needed for moderate pain or severe pain. 08/22/17   Theodis Blaze, MD  Travoprost, BAK Free, (TRAVATAN Z) 0.004 % SOLN ophthalmic solution INSTILL 1 DROP INTO THE LEFT EYE NIGHTLY 11/25/16   [provider]  vitamin C (ASCORBIC ACID) 500 MG tablet Take 500 mg by mouth daily.     [provider]  triamterene-hydrochlorothiazide (MAXZIDE-25) 37.5-25 MG per tablet Take 1 tablet by mouth daily.  09/29/11  [provider]    Physical Exam: Vitals:   02/25/18 2034 02/25/18 2036 02/25/18 2247  BP:   (!) 124/98  Pulse:   97  Resp:   16  SpO2: 96%  97%  Weight:  80.7 kg   Height:  5\' 2"  (1.575 m)       Constitutional: NAD, calm  Eyes: PERTLA, lids and conjunctivae normal ENMT: Mucous membranes are moist. Posterior pharynx clear of any exudate or lesions.   Neck: normal, supple, no masses, no thyromegaly Respiratory: clear to auscultation bilaterally, no wheezing, no crackles. Normal respiratory effort.   Cardiovascular: Rate ~80 and irregular. 2+ pretibial edema bilaterally. No significant JVD. Abdomen: No distension, no tenderness, soft. Bowel sounds normal.  Musculoskeletal: no clubbing / cyanosis. No joint deformity upper and lower extremities.   Skin: Erythema involving bilateral lower legs in gaiter distribution; no wound or drainage. Warm, dry, well-perfused. Neurologic: No facial asymmetry, gross hearing deficit. Moving all extremities.   Psychiatric: Normal judgment and insight. Alert and oriented x 3. Normal mood and affect.     Labs on Admission: I have personally reviewed following labs and imaging studies  CBC: No results for input(s): WBC, NEUTROABS, HGB, HCT, MCV, PLT in the last 168 hours. Basic Metabolic Panel: Recent Labs  Lab 02/25/18 2126  NA 141  K 4.6    CL 105  CO2 24  GLUCOSE 113*  BUN 43*  CREATININE 1.77*  CALCIUM 9.4   GFR: Estimated Creatinine Clearance: 18.3 mL/min (A) (by C-G formula based on SCr of 1.77 mg/dL (H)). Liver Function Tests: No results for input(s): AST, ALT, ALKPHOS, BILITOT, PROT, ALBUMIN in the last 168 hours. No results for input(s): LIPASE, AMYLASE in the last 168 hours. No results for input(s): AMMONIA in the last 168 hours. Coagulation Profile: No results for input(s): INR, PROTIME in the last 168 hours. Cardiac Enzymes: No results for input(s): CKTOTAL, CKMB, CKMBINDEX, TROPONINI in the last 168 hours. BNP (last 3 results) No results for input(s): PROBNP in the last 8760 hours. HbA1C: No results for input(s): HGBA1C in the last 72 hours. CBG: No results for input(s): GLUCAP  in the last 168 hours. Lipid Profile: No results for input(s): CHOL, HDL, LDLCALC, TRIG, CHOLHDL, LDLDIRECT in the last 72 hours. Thyroid Function Tests: No results for input(s): TSH, T4TOTAL, FREET4, T3FREE, THYROIDAB in the last 72 hours. Anemia Panel: No results for input(s): VITAMINB12, FOLATE, FERRITIN, TIBC, IRON, RETICCTPCT in the last 72 hours. Urine analysis:    Component Value Date/Time   COLORURINE YELLOW 02/25/2018 2101   APPEARANCEUR CLEAR 02/25/2018 2101   LABSPEC 1.012 02/25/2018 2101   PHURINE 6.0 02/25/2018 2101   GLUCOSEU NEGATIVE 02/25/2018 2101   HGBUR NEGATIVE 02/25/2018 2101   BILIRUBINUR NEGATIVE 02/25/2018 2101   KETONESUR NEGATIVE 02/25/2018 2101   PROTEINUR NEGATIVE 02/25/2018 2101   UROBILINOGEN 0.2 07/10/2012 1122   NITRITE NEGATIVE 02/25/2018 2101   LEUKOCYTESUR NEGATIVE 02/25/2018 2101   Sepsis Labs: @LABRCNTIP (procalcitonin:4,lacticidven:4) )No results found for this or any previous visit (from the past 240 hour(s)).   Radiological Exams on Admission: Dg Chest 2 View  Result Date: 02/25/2018 CLINICAL DATA:  Lower extremity edema EXAM: CHEST - 2 VIEW COMPARISON:  11/05/2016 chest  radiograph. FINDINGS: Stable cardiomediastinal silhouette with mild cardiomegaly. No pneumothorax. No pleural effusion. Cephalization of the pulmonary vasculature. No overt pulmonary edema. No acute consolidative airspace disease. IMPRESSION: Stable cardiomegaly without overt pulmonary edema. Electronically Signed   By: Ilona Sorrel M.D.   On: 02/25/2018 21:53    EKG: Not performed.   Assessment/Plan   1. Cellulitis  - Presents with bilateral LE edema and erythema that has not improved with doxycycline in outpatient setting  - She is afebrile, WBC not elevated, lactate normal  - There was concern that she could have cellulitis superimposed on venous stasis, she was treated with Ancef in ED, and admission was requested  - Keep legs elevated, start triamcinolone ointment, and continue Ancef   2. Atrial fibrillation  - Rate-controlled  - CHADS-VASc is 70 (age x2, gender, CHF)  - Continue Pradaxa and metoprolol   3. Chronic diastolic CHF  - There is LE edema but no JVD or SOB on admission  - Continue Lasix and beta-blocker, daily wts    4. CKD stage IV  - SCr is 1.77 on admission, similar to priors  - Renally-dose medications    5. Microcytic anemia  - No active bleeding  - H&H are stable    DVT prophylaxis: Pradaxa   Code Status: Full  Family Communication: Discussed with patient  Consults called: none Admission status: observation     Vianne Bulls, MD [facility] Pager [phone]  If 7PM-7AM, please contact night-coverage [email or web address] Password [facility]  02/26/2018, 12:11 AM

## 2018-02-26 NOTE — Progress Notes (Signed)
PROGRESS NOTE    Lori [name]  PJA:[id number] DOB: 1919-02-22 DOA: 02/25/2018 PCP: Leanna Battles, MD   Brief Narrative:  HPI On 02/26/2018 by Dr. Christia Reading Opyd Lori [name] is a 84 y.o. female with medical history significant for atrial fibrillation on Pradaxa, chronic venous stasis, chronic systolic CHF, now presenting to the emergency department for evaluation of bilateral lower extremity erythema and swelling that has not improved with doxycycline at home.  She was seen by her PCP yesterday for these complaints, had her Lasix dose increased and was started on doxycycline.  She reports that the redness and swelling has continued to increase despite this and she comes into the ED for evaluation.  She denies fevers or chills, denies chest pain, and denies any shortness of breath. Assessment & Plan   Lower extremity edema with possible cellulitis -Likely secondary to venous stasis -Patient recently treated with doxycycline with no improvement in her erythema -She tells me she was supposed to have some type of wrap applied to her legs yesterday, 02/25/2018 however this did not occur -Unna boots ordered -Continue Ancef  Atrial fibrillation -Rate controlled, continue metoprolol and Pradaxa -CHADSVASC 4  Chronic diastolic CHF -Currently appears to be euvolemic although does have lower extremity edema.  Denies any current shortness of breath -Continue Lasix, metoprolol -Monitor intake and output, daily weights  Chronic kidney disease, stage IV -Appears stable, continue to monitor  Chronic microcytic anemia -Hemoglobin appears stable, continue to monitor CBC  DVT Prophylaxis  Pradaxa  Code Status: Full  Family Communication: None at beside  Disposition Plan: Observation. Possible discharge in 24-48 hours.  Consultants none  Procedures  none  Antibiotics   Anti-infectives (From admission, onward)   Start     Dose/Rate Route Frequency Ordered Stop   02/26/18 0900   ceFAZolin (ANCEF) IVPB 1 g/50 mL premix     1 g 100 mL/hr over 30 Minutes Intravenous Every 12 hours 02/26/18 0009     02/25/18 2115  ceFAZolin (ANCEF) IVPB 1 g/50 mL premix     1 g 100 mL/hr over 30 Minutes Intravenous  Once 02/25/18 2100 02/25/18 2237      Subjective:   Lori [name] seen and examined today.  Has no major complaints today.  Denies current chest pain, shortness of breath, abdominal pain, nausea or vomiting, diarrhea constipation.  Objective:   Vitals:   02/26/18 0111 02/26/18 0200 02/26/18 0500 02/26/18 0541  BP: 117/63   (!) 107/59  Pulse: 88   68  Resp: 18   16  Temp: 97.6 F (36.4 C)   (!) 96.9 F (36.1 C)  TempSrc: Oral   Axillary  SpO2: 98%   97%  Weight:   84.1 kg   Height:  5\' 2"  (1.575 m)      Intake/Output Summary (Last 24 hours) at 02/26/2018 1309 Last data filed at 02/26/2018 0600 Gross per 24 hour  Intake 321.33 ml  Output 450 ml  Net -128.67 ml   Filed Weights   02/25/18 2036 02/26/18 0500  Weight: 80.7 kg 84.1 kg    Exam  General: Well developed, well nourished, NAD, appears stated age  HEENT: NCAT, mucous membranes moist.   Neck: Supple  Cardiovascular: S1 S2 auscultated, irregular, no murmur  Respiratory: Clear to auscultation bilaterally with equal chest rise  Abdomen: Soft, nontender, nondistended, + bowel sounds  Extremities: warm dry without cyanosis clubbing. LE Edema R>L  Neuro: AAOx3, nonfocal  Skin: LE with venous stasis skin changes, erythema  Psych: Normal affect and demeanor with intact judgement and insight   Data Reviewed: I have personally reviewed following labs and imaging studies  CBC: Recent Labs  Lab 02/25/18 2326  WBC 5.4  NEUTROABS 3.5  HGB 10.5*  HCT 34.4*  MCV 65.6*  PLT 151   Basic Metabolic Panel: Recent Labs  Lab 02/25/18 2126 02/26/18 0503  NA 141 141  K 4.6 4.3  CL 105 108  CO2 24 27  GLUCOSE 113* 93  BUN 43* 39*  CREATININE 1.77* 1.60*  CALCIUM 9.4 9.1    GFR: Estimated Creatinine Clearance: 20.7 mL/min (A) (by C-G formula based on SCr of 1.6 mg/dL (H)). Liver Function Tests: No results for input(s): AST, ALT, ALKPHOS, BILITOT, PROT, ALBUMIN in the last 168 hours. No results for input(s): LIPASE, AMYLASE in the last 168 hours. No results for input(s): AMMONIA in the last 168 hours. Coagulation Profile: No results for input(s): INR, PROTIME in the last 168 hours. Cardiac Enzymes: No results for input(s): CKTOTAL, CKMB, CKMBINDEX, TROPONINI in the last 168 hours. BNP (last 3 results) No results for input(s): PROBNP in the last 8760 hours. HbA1C: No results for input(s): HGBA1C in the last 72 hours. CBG: No results for input(s): GLUCAP in the last 168 hours. Lipid Profile: No results for input(s): CHOL, HDL, LDLCALC, TRIG, CHOLHDL, LDLDIRECT in the last 72 hours. Thyroid Function Tests: No results for input(s): TSH, T4TOTAL, FREET4, T3FREE, THYROIDAB in the last 72 hours. Anemia Panel: No results for input(s): VITAMINB12, FOLATE, FERRITIN, TIBC, IRON, RETICCTPCT in the last 72 hours. Urine analysis:    Component Value Date/Time   COLORURINE YELLOW 02/25/2018 2101   APPEARANCEUR CLEAR 02/25/2018 2101   LABSPEC 1.012 02/25/2018 2101   PHURINE 6.0 02/25/2018 2101   GLUCOSEU NEGATIVE 02/25/2018 2101   HGBUR NEGATIVE 02/25/2018 2101   BILIRUBINUR NEGATIVE 02/25/2018 2101   KETONESUR NEGATIVE 02/25/2018 2101   PROTEINUR NEGATIVE 02/25/2018 2101   UROBILINOGEN 0.2 07/10/2012 1122   NITRITE NEGATIVE 02/25/2018 2101   LEUKOCYTESUR NEGATIVE 02/25/2018 2101   Sepsis Labs: @LABRCNTIP (procalcitonin:4,lacticidven:4)  ) Recent Results (from the past 240 hour(s))  Culture, blood (routine x 2)     Status: None (Preliminary result)   Collection Time: 02/25/18  9:26 PM  Result Value Ref Range Status   Specimen Description   Final    BLOOD LEFT FOREARM Performed at [facility] [address] [place], [place] [address]     Special Requests   Final    BOTTLES DRAWN AEROBIC AND ANAEROBIC Blood Culture adequate volume Performed at [facility] [address], [place], [place] [address]    Culture PENDING  Incomplete   Report Status PENDING  Incomplete      Radiology Studies: Dg Chest 2 View  Result Date: 02/25/2018 CLINICAL DATA:  Lower extremity edema EXAM: CHEST - 2 VIEW COMPARISON:  11/05/2016 chest radiograph. FINDINGS: Stable cardiomediastinal silhouette with mild cardiomegaly. No pneumothorax. No pleural effusion. Cephalization of the pulmonary vasculature. No overt pulmonary edema. No acute consolidative airspace disease. IMPRESSION: Stable cardiomegaly without overt pulmonary edema. Electronically Signed   By: Ilona Sorrel M.D.   On: 02/25/2018 21:53     Scheduled Meds: . acidophilus  1 capsule Oral BID  . dabigatran  75 mg Oral Q12H  . dorzolamide-timolol  1 drop Left Eye BID  . furosemide  40 mg Oral Daily  . latanoprost  1 drop Left Eye QHS  . metoprolol tartrate  25 mg Oral BID  .  pantoprazole  40 mg Oral Daily  . sodium chloride flush  3 mL Intravenous Q12H  . triamcinolone ointment   Topical TID   Continuous Infusions: . sodium chloride    .  ceFAZolin (ANCEF) IV 1 g (02/26/18 0953)     LOS: 0 days   Time Spent in minutes   30 minutes  Lori [name] D.O. on 02/26/2018 at 1:09 PM  Between 7am to 7pm - Please see pager noted on [email or web address]  After 7pm go to [email or web address]  And look for the night coverage person covering for me after hours  [facility] Office  [phone]

## 2018-02-27 DIAGNOSIS — I5042 Chronic combined systolic (congestive) and diastolic (congestive) heart failure: Secondary | ICD-10-CM | POA: Diagnosis present

## 2018-02-27 DIAGNOSIS — R609 Edema, unspecified: Secondary | ICD-10-CM | POA: Diagnosis not present

## 2018-02-27 DIAGNOSIS — Z91048 Other nonmedicinal substance allergy status: Secondary | ICD-10-CM | POA: Diagnosis not present

## 2018-02-27 DIAGNOSIS — I878 Other specified disorders of veins: Secondary | ICD-10-CM | POA: Diagnosis present

## 2018-02-27 DIAGNOSIS — I5032 Chronic diastolic (congestive) heart failure: Secondary | ICD-10-CM | POA: Diagnosis not present

## 2018-02-27 DIAGNOSIS — Z96652 Presence of left artificial knee joint: Secondary | ICD-10-CM | POA: Diagnosis present

## 2018-02-27 DIAGNOSIS — N184 Chronic kidney disease, stage 4 (severe): Secondary | ICD-10-CM | POA: Diagnosis present

## 2018-02-27 DIAGNOSIS — L03119 Cellulitis of unspecified part of limb: Secondary | ICD-10-CM | POA: Diagnosis not present

## 2018-02-27 DIAGNOSIS — L03115 Cellulitis of right lower limb: Secondary | ICD-10-CM | POA: Diagnosis present

## 2018-02-27 DIAGNOSIS — I130 Hypertensive heart and chronic kidney disease with heart failure and stage 1 through stage 4 chronic kidney disease, or unspecified chronic kidney disease: Secondary | ICD-10-CM | POA: Diagnosis present

## 2018-02-27 DIAGNOSIS — E785 Hyperlipidemia, unspecified: Secondary | ICD-10-CM | POA: Diagnosis present

## 2018-02-27 DIAGNOSIS — Z7902 Long term (current) use of antithrombotics/antiplatelets: Secondary | ICD-10-CM | POA: Diagnosis not present

## 2018-02-27 DIAGNOSIS — Z87891 Personal history of nicotine dependence: Secondary | ICD-10-CM | POA: Diagnosis not present

## 2018-02-27 DIAGNOSIS — I272 Pulmonary hypertension, unspecified: Secondary | ICD-10-CM | POA: Diagnosis present

## 2018-02-27 DIAGNOSIS — I481 Persistent atrial fibrillation: Secondary | ICD-10-CM | POA: Diagnosis not present

## 2018-02-27 DIAGNOSIS — L03116 Cellulitis of left lower limb: Secondary | ICD-10-CM | POA: Diagnosis present

## 2018-02-27 DIAGNOSIS — I872 Venous insufficiency (chronic) (peripheral): Secondary | ICD-10-CM | POA: Diagnosis not present

## 2018-02-27 DIAGNOSIS — Z96641 Presence of right artificial hip joint: Secondary | ICD-10-CM | POA: Diagnosis present

## 2018-02-27 DIAGNOSIS — Z801 Family history of malignant neoplasm of trachea, bronchus and lung: Secondary | ICD-10-CM | POA: Diagnosis not present

## 2018-02-27 DIAGNOSIS — J45909 Unspecified asthma, uncomplicated: Secondary | ICD-10-CM | POA: Diagnosis present

## 2018-02-27 DIAGNOSIS — D509 Iron deficiency anemia, unspecified: Secondary | ICD-10-CM | POA: Diagnosis present

## 2018-02-27 DIAGNOSIS — I4891 Unspecified atrial fibrillation: Secondary | ICD-10-CM | POA: Diagnosis present

## 2018-02-27 DIAGNOSIS — Z91040 Latex allergy status: Secondary | ICD-10-CM | POA: Diagnosis not present

## 2018-02-27 LAB — BASIC METABOLIC PANEL
Anion gap: 9 (ref 5–15)
BUN: 36 mg/dL — AB (ref 8–23)
CALCIUM: 9.1 mg/dL (ref 8.9–10.3)
CO2: 26 mmol/L (ref 22–32)
CREATININE: 1.48 mg/dL — AB (ref 0.44–1.00)
Chloride: 105 mmol/L (ref 98–111)
GFR calc non Af Amer: 29 mL/min — ABNORMAL LOW (ref >60)
GFR, EST AFRICAN AMERICAN: 33 mL/min — AB (ref >60)
Glucose, Bld: 93 mg/dL (ref 70–99)
Potassium: 4.9 mmol/L (ref 3.5–5.1)
Sodium: 140 mmol/L (ref 135–145)

## 2018-02-27 LAB — CBC
HEMATOCRIT: 34 % — AB (ref 36.0–46.0)
Hemoglobin: 10.2 g/dL — ABNORMAL LOW (ref 12.0–15.0)
MCH: 20 pg — AB (ref 26.0–34.0)
MCHC: 30 g/dL (ref 30.0–36.0)
MCV: 66.5 fL — ABNORMAL LOW (ref 78.0–100.0)
Platelets: 194 10*3/uL (ref 150–400)
RBC: 5.11 MIL/uL (ref 3.87–5.11)
RDW: 16 % — ABNORMAL HIGH (ref 11.5–15.5)
WBC: 7.8 10*3/uL (ref 4.0–10.5)

## 2018-02-27 NOTE — Progress Notes (Addendum)
PROGRESS NOTE    Lori [name]  XVQ:[id number] DOB: [birth year] DOA: 02/25/2018 PCP: Leanna Battles, MD   Brief Narrative:  HPI On 02/26/2018 by Dr. Christia Reading Opyd Lori [name] is a 84 y.o. female with medical history significant for atrial fibrillation on Pradaxa, chronic venous stasis, chronic systolic CHF, now presenting to the emergency department for evaluation of bilateral lower extremity erythema and swelling that has not improved with doxycycline at home.  She was seen by her PCP yesterday for these complaints, had her Lasix dose increased and was started on doxycycline.  She reports that the redness and swelling has continued to increase despite this and she comes into the ED for evaluation.  She denies fevers or chills, denies chest pain, and denies any shortness of breath. Assessment & Plan   Lower extremity edema with possible cellulitis -Likely secondary to venous stasis -Patient recently treated with doxycycline with no improvement in her erythema -She tells me she was supposed to have some type of wrap applied to her legs yesterday, 02/25/2018 however this did not occur -Unna boots ordered -Continue Ancef -Currently afebrile with no leukocytosis  -blood cultures pending   Atrial fibrillation -Rate controlled, continue metoprolol and Pradaxa -CHADSVASC 4  Chronic diastolic CHF -Currently appears to be euvolemic although does have lower extremity edema.  Denies any current shortness of breath -Continue Lasix, metoprolol -Monitor intake and output, daily weights  Chronic kidney disease, stage IV -Appears stable, continue to monitor  Chronic microcytic anemia -Hemoglobin appears stable, continue to monitor CBC  DVT Prophylaxis  Pradaxa  Code Status: Full  Family Communication: None at beside  Disposition Plan: Observation. Possible discharge in 24 with home health services.   Consultants none  Procedures  none  Antibiotics   Anti-infectives (From  admission, onward)   Start     Dose/Rate Route Frequency Ordered Stop   02/26/18 0900  ceFAZolin (ANCEF) IVPB 1 g/50 mL premix     1 g 100 mL/hr over 30 Minutes Intravenous Every 12 hours 02/26/18 0009     02/25/18 2115  ceFAZolin (ANCEF) IVPB 1 g/50 mL premix     1 g 100 mL/hr over 30 Minutes Intravenous  Once 02/25/18 2100 02/25/18 2237      Subjective:   Lori [name] seen and examined today. No complaints today. Feels her swelling has improved and she is able to move her feet more today. Denies current chest pain, shortness of breath, abdominal pain, nausea, vomiting, diarrhea, constipation.   Objective:   Vitals:   02/26/18 1425 02/26/18 1805 02/26/18 2057 02/27/18 0437  BP: 121/60 (!) 133/59 (!) 122/58 (!) 105/49  Pulse: 77 75 76 80  Resp: 18 18 16 15   Temp: 97.8 F (36.6 C) 97.7 F (36.5 C) 97.8 F (36.6 C) 97.6 F (36.4 C)  TempSrc: Oral Oral Oral Oral  SpO2: 97% 98% 98% 95%  Weight:      Height:        Intake/Output Summary (Last 24 hours) at 02/27/2018 1215 Last data filed at 02/27/2018 0940 Gross per 24 hour  Intake 1000 ml  Output -  Net 1000 ml   Filed Weights   02/25/18 2036 02/26/18 0500  Weight: 80.7 kg 84.1 kg   Exam  General: Well developed, well nourished, NAD, appears stated age  HEENT: NCAT, mucous membranes moist.   Neck: Supple  Cardiovascular: S1 S2 auscultated, irregular  Respiratory: Clear to auscultation bilaterally with equal chest rise  Abdomen: Soft, nontender, nondistended, + bowel sounds  Extremities: warm dry without cyanosis clubbing. LEE, R>L. Unna boots in place  Neuro: AAOx3, nonfocal  Psych: appropriate mood and affect, pleasant    Data Reviewed: I have personally reviewed following labs and imaging studies  CBC: Recent Labs  Lab 02/25/18 2326 02/27/18 0419  WBC 5.4 7.8  NEUTROABS 3.5  --   HGB 10.5* 10.2*  HCT 34.4* 34.0*  MCV 65.6* 66.5*  PLT 196 680   Basic Metabolic Panel: Recent Labs  Lab  02/25/18 2126 02/26/18 0503 02/27/18 0419  NA 141 141 140  K 4.6 4.3 4.9  CL 105 108 105  CO2 24 27 26   GLUCOSE 113* 93 93  BUN 43* 39* 36*  CREATININE 1.77* 1.60* 1.48*  CALCIUM 9.4 9.1 9.1   GFR: Estimated Creatinine Clearance: 22.4 mL/min (A) (by C-G formula based on SCr of 1.48 mg/dL (H)). Liver Function Tests: No results for input(s): AST, ALT, ALKPHOS, BILITOT, PROT, ALBUMIN in the last 168 hours. No results for input(s): LIPASE, AMYLASE in the last 168 hours. No results for input(s): AMMONIA in the last 168 hours. Coagulation Profile: No results for input(s): INR, PROTIME in the last 168 hours. Cardiac Enzymes: No results for input(s): CKTOTAL, CKMB, CKMBINDEX, TROPONINI in the last 168 hours. BNP (last 3 results) No results for input(s): PROBNP in the last 8760 hours. HbA1C: No results for input(s): HGBA1C in the last 72 hours. CBG: No results for input(s): GLUCAP in the last 168 hours. Lipid Profile: No results for input(s): CHOL, HDL, LDLCALC, TRIG, CHOLHDL, LDLDIRECT in the last 72 hours. Thyroid Function Tests: No results for input(s): TSH, T4TOTAL, FREET4, T3FREE, THYROIDAB in the last 72 hours. Anemia Panel: No results for input(s): VITAMINB12, FOLATE, FERRITIN, TIBC, IRON, RETICCTPCT in the last 72 hours. Urine analysis:    Component Value Date/Time   COLORURINE YELLOW 02/25/2018 2101   APPEARANCEUR CLEAR 02/25/2018 2101   LABSPEC 1.012 02/25/2018 2101   PHURINE 6.0 02/25/2018 2101   GLUCOSEU NEGATIVE 02/25/2018 2101   HGBUR NEGATIVE 02/25/2018 2101   BILIRUBINUR NEGATIVE 02/25/2018 2101   KETONESUR NEGATIVE 02/25/2018 2101   PROTEINUR NEGATIVE 02/25/2018 2101   UROBILINOGEN 0.2 07/10/2012 1122   NITRITE NEGATIVE 02/25/2018 2101   LEUKOCYTESUR NEGATIVE 02/25/2018 2101   Sepsis Labs: @LABRCNTIP (procalcitonin:4,lacticidven:4)  ) Recent Results (from the past 240 hour(s))  Culture, blood (routine x 2)     Status: None (Preliminary result)    Collection Time: 02/25/18  9:26 PM  Result Value Ref Range Status   Specimen Description   Final    BLOOD LEFT FOREARM Performed at [facility], [place] [address], [place], [place] [address]    Special Requests   Final    BOTTLES DRAWN AEROBIC AND ANAEROBIC Blood Culture adequate volume Performed at [facility] [address] [place], [place] [address]    Culture PENDING  Incomplete   Report Status PENDING  Incomplete      Radiology Studies: Dg Chest 2 View  Result Date: 02/25/2018 CLINICAL DATA:  Lower extremity edema EXAM: CHEST - 2 VIEW COMPARISON:  11/05/2016 chest radiograph. FINDINGS: Stable cardiomediastinal silhouette with mild cardiomegaly. No pneumothorax. No pleural effusion. Cephalization of the pulmonary vasculature. No overt pulmonary edema. No acute consolidative airspace disease. IMPRESSION: Stable cardiomegaly without overt pulmonary edema. Electronically Signed   By: Ilona Sorrel M.D.   On: 02/25/2018 21:53     Scheduled Meds: . acidophilus  1 capsule Oral BID  . dabigatran  75 mg Oral Q12H  . dorzolamide-timolol  1 drop Left Eye BID  . furosemide  40 mg Oral Daily  . latanoprost  1 drop Left Eye QHS  . metoprolol tartrate  25 mg Oral BID  . pantoprazole  40 mg Oral Daily  . sodium chloride flush  3 mL Intravenous Q12H  . triamcinolone ointment   Topical TID   Continuous Infusions: . sodium chloride    .  ceFAZolin (ANCEF) IV 1 g (02/27/18 1046)     LOS: 0 days   Time Spent in minutes   30 minutes  Annamae Shivley D.O. on 02/27/2018 at 12:15 PM  Between 7am to 7pm - Please see pager noted on [email or web address]  After 7pm go to [email or web address]  And look for the night coverage person covering for me after hours  [facility] Office  [phone]

## 2018-02-27 NOTE — Care Management Obs Status (Signed)
[facility] NOTIFICATION   Patient Details  Name: Lori [name] MRN: [id number] Date of Birth: 03-22-1919   Medicare Observation Status Notification Given:  Yes    Erenest Rasher, RN 02/27/2018, 11:46 AM

## 2018-02-27 NOTE — Care Management Note (Signed)
Case Management Note  Patient Details  Name: ARTHELIA CALLICOTT MRN: [id number] Date of Birth: [birth year]  Subjective/Objective:    Lower extremity edema possible, afib, CHF                Action/Plan: NCM spoke to pt's [place], Danielle Rankin # [phone]. Pt lives at [employer] She has RW at home. She is active with [facility]. Grand-dtr has concerns about pt. She lives at home alone. Contacted [facility] to make aware of dc home tomorrow with HHPT and aide. Pt was active with [facility].   Expected Discharge Date:                  Expected Discharge Plan:  [place]  In-House Referral:  NA  Discharge planning Services  CM Consult  Post Acute Care Choice:  Home Health, Resumption of Svcs/PTA Provider Choice offered to:  [facility] POA / Guardian  DME Arranged:  N/A DME Agency:  NA  HH Arranged:  PT, RN, Nurse's Aide, Social Work [employer] Agency:  [facility] [employer]  Status of Service:  Completed, signed off  If discussed at [employer], dates discussed:    Additional Comments:  Erenest Rasher, RN 02/27/2018, 11:23 AM

## 2018-02-28 ENCOUNTER — Ambulatory Visit: Payer: Medicare Other | Admitting: Podiatry

## 2018-02-28 MED ORDER — DOXYCYCLINE HYCLATE 100 MG PO TABS: 100.0000 mg | ORAL_TABLET | Freq: Two times a day (BID) | ORAL | 0 refills | Status: DC

## 2018-02-28 MED ORDER — CEPHALEXIN 250 MG PO CAPS: 250.0000 mg | ORAL_CAPSULE | Freq: Two times a day (BID) | ORAL | 0 refills | Status: AC

## 2018-02-28 NOTE — Discharge Summary (Signed)
Physician Discharge Summary  ELSA PLOCH IFO:[id number] DOB: 10/12/1918 DOA: 02/25/2018  PCP: Leanna Battles, MD  Admit date: 02/25/2018 Discharge date: 02/28/2018  Time spent: 45 minutes  Recommendations for Outpatient Follow-up:  Patient will be discharged to home with home health nursing and physical therapy.  Patient will need to follow up with primary care provider within one week of discharge.  Patient should continue medications as prescribed.  Patient should follow a heart healthy diet.   Discharge Diagnoses:  Lower extremity edema with possible cellulitis Atrial fibrillation Chronic diastolic CHF Chronic kidney disease, stage IV Chronic microcytic anemia  Discharge Condition: Stable  Diet recommendation: Heart healthy  Filed Weights   02/25/18 2036 02/26/18 0500  Weight: 80.7 kg 84.1 kg    History of present illness:  On 02/26/2018 by Dr. Christia Reading Opyd Oletta Lamas Broylesis a 84 y.o.femalewith medical history significant foratrial fibrillation on Pradaxa, chronic venous stasis, chronic systolic CHF, now presenting to the emergency department for evaluation of bilateral lower extremity erythema and swelling that has not improved with doxycycline at home. She was seen by her PCP yesterday for these complaints, had her Lasix dose increased and was started on doxycycline. She reports that the redness and swelling has continued to increase despite this and she comes into the ED for evaluation. She denies fevers or chills, denies chest pain, and denies any shortness of breath.  Hospital Course:  Lower extremity edema with possible cellulitis -Likely secondary to venous stasis -Patient recently treated with doxycycline with no improvement in her erythema -She tells me she was supposed to have some type of wrap applied to her legs yesterday, 02/25/2018 however this did not occur -Unna boots in place -Placed on Ancef -Currently afebrile with no leukocytosis  -blood  cultures show no growth to date -Will discharge with keflex 250mg  BID (renally adjusted) and doxycycline 100mg  BID   Atrial fibrillation -Rate controlled, continue metoprolol and Pradaxa -CHADSVASC 4  Chronic diastolic CHF -Currently appears to be euvolemic although does have lower extremity edema.  Denies any current shortness of breath -Continue Lasix, metoprolol, spironolactone -Discuss dosages with PCP of diuretics   Chronic kidney disease, stage IV -Appears stable  Chronic microcytic anemia -Hemoglobin appears stable  Procedures: None  Consultations: None  Discharge Exam: Vitals:   02/27/18 2300 02/28/18 0526  BP: 104/65 105/68  Pulse:  69  Resp:  (!) 26  Temp:  98.4 F (36.9 C)  SpO2:  95%   Patient feeling better today. Denies current chest pain, shortness of breath, abdominal pain, N/V/D/C.    General: Well developed, well nourished, NAD, appears stated age  HEENT: NCAT, mucous membranes moist.  Neck: Supple  Cardiovascular: S1 S2 auscultated, irregular  Respiratory: Clear to auscultation bilaterally with equal chest rise  Abdomen: Soft, nontender, nondistended, + bowel sounds  Extremities: warm dry without cyanosis clubbing. Lower ext edema R>L, Unna boots in place  Neuro: AAOx3, nonfocal  Psych: Appropriate mood and affect, pleasant   Discharge Instructions Discharge Instructions    Discharge instructions   Complete by:  As directed    Patient will be discharged to home with home health nursing and physical therapy.  Patient will need to follow up with primary care provider within one week of discharge.  Patient should continue medications as prescribed.  Patient should follow a heart healthy diet.     Allergies as of 02/28/2018      Reactions   Dust Mite Extract Shortness Of Breath, Itching   Mold Extract [trichophyton  Mentagrophyte] Shortness Of Breath, Itching, Cough   VERY ALLERGIC/EXPOSURE RESULTS IN TREMENDOUS COUGHING (just one  symptom)   Latex Itching   And certain fabrics   Tape Other (See Comments)   SKIN IS VERY THIN AND WILL TEAR AND BRUISE EASILY!! PLEASE USE COBAN WRAP-      Medication List    STOP taking these medications   traMADol 50 MG tablet Commonly known as:  ULTRAM     TAKE these medications   ALIGN PO Take 1 capsule by mouth 2 (two) times daily.   cephALEXin 250 MG capsule Commonly known as:  KEFLEX Take 1 capsule (250 mg total) by mouth 2 (two) times daily for 5 days.   dabigatran 75 MG Caps capsule Commonly known as:  PRADAXA TAKE 1 CAPSULE EVERY 12 HOURS What changed:    how much to take  how to take this  when to take this   dorzolamide-timolol 22.3-6.8 MG/ML ophthalmic solution Commonly known as:  COSOPT Place 1 drop into the left eye 2 (two) times daily.   doxycycline 100 MG tablet Commonly known as:  VIBRA-TABS Take 1 tablet (100 mg total) by mouth every 12 (twelve) hours.   furosemide 20 MG tablet Commonly known as:  LASIX Take 40mg  PO daily, take an extra 20mg  Pill if your weight is over 2 pounds in 24hrs or your notice extra fluid in your legs. What changed:    how much to take  how to take this  when to take this  additional instructions   loratadine 10 MG tablet Commonly known as:  CLARITIN Take 10 mg by mouth at bedtime.   metoprolol tartrate 25 MG tablet Commonly known as:  LOPRESSOR Take 1 tablet (25 mg total) by mouth 2 (two) times daily.   omeprazole 40 MG capsule Commonly known as:  PRILOSEC Take 40 mg by mouth 2 (two) times daily.   PROVENTIL HFA 108 (90 Base) MCG/ACT inhaler Generic drug:  albuterol Inhale 2 puffs into the lungs every 6 (six) hours as needed for wheezing or shortness of breath.   spironolactone 50 MG tablet Commonly known as:  ALDACTONE Take 50 mg by mouth every Monday, Wednesday, and Friday.   TRAVATAN Z 0.004 % Soln ophthalmic solution Generic drug:  Travoprost (BAK Free) Place 1 drop into the left eye at  bedtime.   vitamin C 500 MG tablet Commonly known as:  ASCORBIC ACID Take 500 mg by mouth daily.      Allergies  Allergen Reactions  . Dust Mite Extract Shortness Of Breath and Itching  . Mold Extract [Trichophyton Mentagrophyte] Shortness Of Breath, Itching and Cough    VERY ALLERGIC/EXPOSURE RESULTS IN TREMENDOUS COUGHING (just one symptom)  . Latex Itching    And certain fabrics  . Tape Other (See Comments)    SKIN IS VERY THIN AND WILL TEAR AND BRUISE EASILY!! PLEASE USE COBAN WRAP-   Follow-up Information    Leanna Battles, MD. Schedule an appointment as soon as possible for a visit in 1 week(s).   Specialty:  Internal Medicine Why:  Hospital follow up Contact information: [address] [place] [address] [phone]            The results of significant diagnostics from this hospitalization (including imaging, microbiology, ancillary and laboratory) are listed below for reference.    Significant Diagnostic Studies: Dg Chest 2 View  Result Date: 02/25/2018 CLINICAL DATA:  Lower extremity edema EXAM: CHEST - 2 VIEW COMPARISON:  11/05/2016 chest radiograph. FINDINGS:  Stable cardiomediastinal silhouette with mild cardiomegaly. No pneumothorax. No pleural effusion. Cephalization of the pulmonary vasculature. No overt pulmonary edema. No acute consolidative airspace disease. IMPRESSION: Stable cardiomegaly without overt pulmonary edema. Electronically Signed   By: Ilona Sorrel M.D.   On: 02/25/2018 21:53    Microbiology: Recent Results (from the past 240 hour(s))  Culture, blood (routine x 2)     Status: None (Preliminary result)   Collection Time: 02/25/18  9:03 PM  Result Value Ref Range Status   Specimen Description   Final    BLOOD RIGHT ANTECUBITAL Performed at [facility] [address] [place], [place] [address]    Special Requests   Final    BOTTLES DRAWN AEROBIC AND ANAEROBIC Blood Culture adequate volume Performed at [facility] [address], [place], [place] [address]    Culture   Final    NO GROWTH 1 DAY Performed at [facility], [place] [address], [place], [place] [address]    Report Status PENDING  Incomplete  Culture, blood (routine x 2)     Status: None (Preliminary result)   Collection Time: 02/25/18  9:26 PM  Result Value Ref Range Status   Specimen Description   Final    BLOOD LEFT FOREARM Performed at [facility], [place] [address], [place], [place] [address]    Special Requests   Final    BOTTLES DRAWN AEROBIC AND ANAEROBIC Blood Culture adequate volume Performed at [facility] [address], [place], [place] [address]    Culture   Final    NO GROWTH 1 DAY Performed at [facility], [place] [address], [place], [place] [address]    Report Status PENDING  Incomplete     Labs: Basic Metabolic Panel: Recent Labs  Lab 02/25/18 2126 02/26/18 0503 02/27/18 0419  NA 141 141 140  K 4.6 4.3 4.9  CL 105 108 105  CO2 24 27 26   GLUCOSE 113* 93 93  BUN 43* 39* 36*  CREATININE 1.77* 1.60* 1.48*  CALCIUM 9.4 9.1 9.1   Liver Function Tests: No results for input(s): AST, ALT, ALKPHOS, BILITOT, PROT, ALBUMIN in the last 168 hours. No results for input(s): LIPASE, AMYLASE in the last 168 hours. No results for input(s): AMMONIA in the last 168 hours. CBC: Recent Labs  Lab 02/25/18 2326 02/27/18 0419  WBC 5.4 7.8  NEUTROABS 3.5  --   HGB 10.5* 10.2*  HCT 34.4* 34.0*  MCV 65.6* 66.5*  PLT 196 194   Cardiac Enzymes: No results for input(s): CKTOTAL, CKMB, CKMBINDEX, TROPONINI in the last 168 hours. BNP: BNP (last 3 results) Recent Labs    02/25/18 2239  BNP 380.8*    ProBNP (last 3 results) No results for input(s): PROBNP in the last 8760 hours.  CBG: No results for input(s): GLUCAP in the last 168 hours.     Signed:  Cristal Ford  [facility] 02/28/2018, 10:35 AM

## 2018-02-28 NOTE — Discharge Instructions (Signed)

## 2018-03-03 LAB — CULTURE, BLOOD (ROUTINE X 2)
CULTURE: NO GROWTH
Culture: NO GROWTH
Special Requests: ADEQUATE
Special Requests: ADEQUATE

## 2018-03-16 DIAGNOSIS — T148XXA Other injury of unspecified body region, initial encounter: Secondary | ICD-10-CM | POA: Diagnosis not present

## 2018-03-16 DIAGNOSIS — I87319 Chronic venous hypertension (idiopathic) with ulcer of unspecified lower extremity: Secondary | ICD-10-CM | POA: Diagnosis not present

## 2018-03-16 DIAGNOSIS — R600 Localized edema: Secondary | ICD-10-CM | POA: Diagnosis not present

## 2018-03-16 DIAGNOSIS — I5032 Chronic diastolic (congestive) heart failure: Secondary | ICD-10-CM | POA: Diagnosis not present

## 2018-03-16 DIAGNOSIS — Z23 Encounter for immunization: Secondary | ICD-10-CM | POA: Diagnosis not present

## 2018-03-16 DIAGNOSIS — I872 Venous insufficiency (chronic) (peripheral): Secondary | ICD-10-CM | POA: Diagnosis not present

## 2018-03-16 DIAGNOSIS — Z6830 Body mass index (BMI) 30.0-30.9, adult: Secondary | ICD-10-CM | POA: Diagnosis not present

## 2018-03-18 ENCOUNTER — Ambulatory Visit: Payer: Commercial Indemnity | Admitting: Internal Medicine

## 2018-03-18 ENCOUNTER — Encounter: Payer: Self-pay | Admitting: Internal Medicine

## 2018-03-18 ENCOUNTER — Ambulatory Visit (INDEPENDENT_AMBULATORY_CARE_PROVIDER_SITE_OTHER): Payer: Medicare Other | Admitting: Internal Medicine

## 2018-03-18 VITALS — BP 124/72 | HR 84 | Ht 62.0 in | Wt 185.0 lb

## 2018-03-18 DIAGNOSIS — I482 Chronic atrial fibrillation, unspecified: Secondary | ICD-10-CM

## 2018-03-18 DIAGNOSIS — I5032 Chronic diastolic (congestive) heart failure: Secondary | ICD-10-CM

## 2018-03-18 NOTE — Patient Instructions (Signed)

## 2018-03-18 NOTE — Progress Notes (Signed)
HPI Lori [name] returns today for followup. She is a 84 year old woman with a history of chronic atrial fibrillation, hypertension, and venous insufficiency. In the interim she has done well. She denies chest pain , or syncope. She has class II dyspnea. She has chronic lower extremity edema. She denies palpitations. She has not been in the hospital and her swelling if anything is better. No bleeding on the Pradaxa.  Allergies  Allergen Reactions  . Dust Mite Extract Shortness Of Breath and Itching  . Mold Extract [Trichophyton Mentagrophyte] Shortness Of Breath, Itching and Cough    VERY ALLERGIC/EXPOSURE RESULTS IN TREMENDOUS COUGHING (just one symptom)  . Latex Itching    And certain fabrics  . Tape Other (See Comments)    SKIN IS VERY THIN AND WILL TEAR AND BRUISE EASILY!! PLEASE USE COBAN WRAP-     Current Outpatient Medications  Medication Sig Dispense Refill  . albuterol (PROVENTIL HFA) 108 (90 Base) MCG/ACT inhaler Inhale 2 puffs into the lungs every 6 (six) hours as needed for wheezing or shortness of breath.    . dabigatran (PRADAXA) 75 MG CAPS capsule TAKE 1 CAPSULE EVERY 12 HOURS 180 capsule 0  . dorzolamide-timolol (COSOPT) 22.3-6.8 MG/ML ophthalmic solution Place 1 drop into the left eye 2 (two) times daily.     . furosemide (LASIX) 20 MG tablet Take 40mg  PO daily, take an extra 20mg  Pill if your weight is over 2 pounds in 24hrs or your notice extra fluid in your legs. 60 tablet 0  . loratadine (CLARITIN) 10 MG tablet Take 10 mg by mouth at bedtime.    . metoprolol tartrate (LOPRESSOR) 25 MG tablet Take 1 tablet (25 mg total) by mouth 2 (two) times daily. 180 tablet 2  . omeprazole (PRILOSEC) 40 MG capsule Take 40 mg by mouth 2 (two) times daily.     . Probiotic Product (ALIGN PO) Take 1 capsule by mouth 2 (two) times daily.     Marland Kitchen spironolactone (ALDACTONE) 50 MG tablet Take 50 mg by mouth every Monday, Wednesday, and Friday.    . Travoprost, BAK Free, (TRAVATAN Z)  0.004 % SOLN ophthalmic solution Place 1 drop into the left eye at bedtime.     . triamterene-hydrochlorothiazide (MAXZIDE-25) 37.5-25 MG tablet Take 1 tablet by mouth daily.    . vitamin C (ASCORBIC ACID) 500 MG tablet Take 500 mg by mouth daily.      No current facility-administered medications for this visit.      Past Medical History:  Diagnosis Date  . Anemia   . Asthma   . Cellulitis   . Chronic acquired lymphedema   . Chronic venous insufficiency   . Dysrhythmia   . Hyperlipidemia   . Hypertension     ROS:   All systems reviewed and negative except as noted in the HPI.   Past Surgical History:  Procedure Laterality Date  . JOINT REPLACEMENT    . left total knee replacement    . right total hip replacement       Family History  Problem Relation Age of Onset  . Lung cancer Mother   . Other Father        unknown causes  . Breast cancer Neg Hx   . Diabetes Neg Hx      Social History   Socioeconomic History  . Marital status: Widowed    Spouse name: Not on file  . Number of children: Not on file  . Years of education:  Not on file  . Highest education level: Not on file  Occupational History  . Not on file  Social Needs  . Financial resource strain: Not on file  . Food insecurity:    Worry: Not on file    Inability: Not on file  . Transportation needs:    Medical: Not on file    Non-medical: Not on file  Tobacco Use  . Smoking status: Former Smoker    Last attempt to quit: 07/28/1959    Years since quitting: 58.6  . Smokeless tobacco: Never Used  Substance and Sexual Activity  . Alcohol use: No  . Drug use: No  . Sexual activity: Not on file  Lifestyle  . Physical activity:    Days per week: Not on file    Minutes per session: Not on file  . Stress: Not on file  Relationships  . Social connections:    Talks on phone: Not on file    Gets together: Not on file    Attends religious service: Not on file    Active member of club or  organization: Not on file    Attends meetings of clubs or organizations: Not on file    Relationship status: Not on file  . Intimate partner violence:    Fear of current or ex partner: Not on file    Emotionally abused: Not on file    Physically abused: Not on file    Forced sexual activity: Not on file  Other Topics Concern  . Not on file  Social History Narrative  . Not on file     BP 124/72   Pulse 84   Ht 5\' 2"  (1.575 m)   Wt 185 lb (83.9 kg)   BMI 33.84 kg/m   Physical Exam:  Well appearing 84 yo woman, NAD HEENT: Unremarkable Neck:  6 cm JVD, no thyromegally Lymphatics:  No adenopathy Back:  No CVA tenderness Lungs:  Clear with no wheezes HEART:  Regular rate rhythm, no murmurs, no rubs, no clicks Abd:  soft, positive bowel sounds, no organomegally, no rebound, no guarding Ext:  2 plus pulses, no edema, no cyanosis, no clubbing Skin:  No rashes no nodules Neuro:  CN II through XII intact, motor grossly intact  EKG - none   Assess/Plan: 1. Chronic atrial fib - her ventricular rates are well controlled. She will continue her rate control with metoprolol 2. Coags - she has not had any bleeding on her systemic anti-coagulation. 3. HTN - her blood pressure is well controlled today. No change in her meds. She is encouraged to maintain a low sodium diet. 4. Peripheral edema - she has fairly severe venous insufficiency. She is wearing support stockings but is not interested in any tighter hose.  Lori [name].D.

## 2018-03-28 DIAGNOSIS — I872 Venous insufficiency (chronic) (peripheral): Secondary | ICD-10-CM | POA: Diagnosis not present

## 2018-03-28 DIAGNOSIS — I5032 Chronic diastolic (congestive) heart failure: Secondary | ICD-10-CM | POA: Diagnosis not present

## 2018-03-28 DIAGNOSIS — Z6831 Body mass index (BMI) 31.0-31.9, adult: Secondary | ICD-10-CM | POA: Diagnosis not present

## 2018-03-28 DIAGNOSIS — R0609 Other forms of dyspnea: Secondary | ICD-10-CM | POA: Diagnosis not present

## 2018-03-28 DIAGNOSIS — R600 Localized edema: Secondary | ICD-10-CM | POA: Diagnosis not present

## 2018-04-06 DIAGNOSIS — I5033 Acute on chronic diastolic (congestive) heart failure: Secondary | ICD-10-CM | POA: Diagnosis not present

## 2018-04-06 DIAGNOSIS — E669 Obesity, unspecified: Secondary | ICD-10-CM | POA: Diagnosis not present

## 2018-04-06 DIAGNOSIS — I872 Venous insufficiency (chronic) (peripheral): Secondary | ICD-10-CM | POA: Diagnosis not present

## 2018-04-06 DIAGNOSIS — I4891 Unspecified atrial fibrillation: Secondary | ICD-10-CM | POA: Diagnosis not present

## 2018-04-06 DIAGNOSIS — N183 Chronic kidney disease, stage 3 (moderate): Secondary | ICD-10-CM | POA: Diagnosis not present

## 2018-04-06 DIAGNOSIS — H3530 Unspecified macular degeneration: Secondary | ICD-10-CM | POA: Diagnosis not present

## 2018-04-06 DIAGNOSIS — Z9181 History of falling: Secondary | ICD-10-CM | POA: Diagnosis not present

## 2018-04-06 DIAGNOSIS — Z87891 Personal history of nicotine dependence: Secondary | ICD-10-CM | POA: Diagnosis not present

## 2018-04-06 DIAGNOSIS — E668 Other obesity: Secondary | ICD-10-CM | POA: Diagnosis not present

## 2018-04-06 DIAGNOSIS — I130 Hypertensive heart and chronic kidney disease with heart failure and stage 1 through stage 4 chronic kidney disease, or unspecified chronic kidney disease: Secondary | ICD-10-CM | POA: Diagnosis not present

## 2018-04-06 DIAGNOSIS — H903 Sensorineural hearing loss, bilateral: Secondary | ICD-10-CM | POA: Diagnosis not present

## 2018-04-06 DIAGNOSIS — Z6831 Body mass index (BMI) 31.0-31.9, adult: Secondary | ICD-10-CM | POA: Diagnosis not present

## 2018-04-07 DIAGNOSIS — H353213 Exudative age-related macular degeneration, right eye, with inactive scar: Secondary | ICD-10-CM | POA: Diagnosis not present

## 2018-04-07 DIAGNOSIS — H353221 Exudative age-related macular degeneration, left eye, with active choroidal neovascularization: Secondary | ICD-10-CM | POA: Diagnosis not present

## 2018-04-07 DIAGNOSIS — H35362 Drusen (degenerative) of macula, left eye: Secondary | ICD-10-CM | POA: Diagnosis not present

## 2018-04-09 DIAGNOSIS — H903 Sensorineural hearing loss, bilateral: Secondary | ICD-10-CM | POA: Diagnosis not present

## 2018-04-09 DIAGNOSIS — I872 Venous insufficiency (chronic) (peripheral): Secondary | ICD-10-CM | POA: Diagnosis not present

## 2018-04-09 DIAGNOSIS — N183 Chronic kidney disease, stage 3 (moderate): Secondary | ICD-10-CM | POA: Diagnosis not present

## 2018-04-09 DIAGNOSIS — I4891 Unspecified atrial fibrillation: Secondary | ICD-10-CM | POA: Diagnosis not present

## 2018-04-09 DIAGNOSIS — I130 Hypertensive heart and chronic kidney disease with heart failure and stage 1 through stage 4 chronic kidney disease, or unspecified chronic kidney disease: Secondary | ICD-10-CM | POA: Diagnosis not present

## 2018-04-09 DIAGNOSIS — I5033 Acute on chronic diastolic (congestive) heart failure: Secondary | ICD-10-CM | POA: Diagnosis not present

## 2018-04-12 DIAGNOSIS — I5033 Acute on chronic diastolic (congestive) heart failure: Secondary | ICD-10-CM | POA: Diagnosis not present

## 2018-04-12 DIAGNOSIS — N183 Chronic kidney disease, stage 3 (moderate): Secondary | ICD-10-CM | POA: Diagnosis not present

## 2018-04-12 DIAGNOSIS — I872 Venous insufficiency (chronic) (peripheral): Secondary | ICD-10-CM | POA: Diagnosis not present

## 2018-04-12 DIAGNOSIS — I130 Hypertensive heart and chronic kidney disease with heart failure and stage 1 through stage 4 chronic kidney disease, or unspecified chronic kidney disease: Secondary | ICD-10-CM | POA: Diagnosis not present

## 2018-04-12 DIAGNOSIS — I4891 Unspecified atrial fibrillation: Secondary | ICD-10-CM | POA: Diagnosis not present

## 2018-04-12 DIAGNOSIS — H903 Sensorineural hearing loss, bilateral: Secondary | ICD-10-CM | POA: Diagnosis not present

## 2018-04-15 DIAGNOSIS — H903 Sensorineural hearing loss, bilateral: Secondary | ICD-10-CM | POA: Diagnosis not present

## 2018-04-15 DIAGNOSIS — I4891 Unspecified atrial fibrillation: Secondary | ICD-10-CM | POA: Diagnosis not present

## 2018-04-15 DIAGNOSIS — I5033 Acute on chronic diastolic (congestive) heart failure: Secondary | ICD-10-CM | POA: Diagnosis not present

## 2018-04-15 DIAGNOSIS — I872 Venous insufficiency (chronic) (peripheral): Secondary | ICD-10-CM | POA: Diagnosis not present

## 2018-04-15 DIAGNOSIS — I130 Hypertensive heart and chronic kidney disease with heart failure and stage 1 through stage 4 chronic kidney disease, or unspecified chronic kidney disease: Secondary | ICD-10-CM | POA: Diagnosis not present

## 2018-04-15 DIAGNOSIS — N183 Chronic kidney disease, stage 3 (moderate): Secondary | ICD-10-CM | POA: Diagnosis not present

## 2018-04-19 DIAGNOSIS — H6123 Impacted cerumen, bilateral: Secondary | ICD-10-CM | POA: Diagnosis not present

## 2018-04-19 DIAGNOSIS — N183 Chronic kidney disease, stage 3 (moderate): Secondary | ICD-10-CM | POA: Diagnosis not present

## 2018-04-19 DIAGNOSIS — I4891 Unspecified atrial fibrillation: Secondary | ICD-10-CM | POA: Diagnosis not present

## 2018-04-19 DIAGNOSIS — Z6830 Body mass index (BMI) 30.0-30.9, adult: Secondary | ICD-10-CM | POA: Diagnosis not present

## 2018-04-19 DIAGNOSIS — I5033 Acute on chronic diastolic (congestive) heart failure: Secondary | ICD-10-CM | POA: Diagnosis not present

## 2018-04-19 DIAGNOSIS — H903 Sensorineural hearing loss, bilateral: Secondary | ICD-10-CM | POA: Diagnosis not present

## 2018-04-19 DIAGNOSIS — I872 Venous insufficiency (chronic) (peripheral): Secondary | ICD-10-CM | POA: Diagnosis not present

## 2018-04-19 DIAGNOSIS — I130 Hypertensive heart and chronic kidney disease with heart failure and stage 1 through stage 4 chronic kidney disease, or unspecified chronic kidney disease: Secondary | ICD-10-CM | POA: Diagnosis not present

## 2018-04-22 DIAGNOSIS — I4891 Unspecified atrial fibrillation: Secondary | ICD-10-CM | POA: Diagnosis not present

## 2018-04-22 DIAGNOSIS — H903 Sensorineural hearing loss, bilateral: Secondary | ICD-10-CM | POA: Diagnosis not present

## 2018-04-22 DIAGNOSIS — N183 Chronic kidney disease, stage 3 (moderate): Secondary | ICD-10-CM | POA: Diagnosis not present

## 2018-04-22 DIAGNOSIS — I5033 Acute on chronic diastolic (congestive) heart failure: Secondary | ICD-10-CM | POA: Diagnosis not present

## 2018-04-22 DIAGNOSIS — I872 Venous insufficiency (chronic) (peripheral): Secondary | ICD-10-CM | POA: Diagnosis not present

## 2018-04-22 DIAGNOSIS — I130 Hypertensive heart and chronic kidney disease with heart failure and stage 1 through stage 4 chronic kidney disease, or unspecified chronic kidney disease: Secondary | ICD-10-CM | POA: Diagnosis not present

## 2018-04-26 DIAGNOSIS — H903 Sensorineural hearing loss, bilateral: Secondary | ICD-10-CM | POA: Diagnosis not present

## 2018-04-26 DIAGNOSIS — I5033 Acute on chronic diastolic (congestive) heart failure: Secondary | ICD-10-CM | POA: Diagnosis not present

## 2018-04-26 DIAGNOSIS — N183 Chronic kidney disease, stage 3 (moderate): Secondary | ICD-10-CM | POA: Diagnosis not present

## 2018-04-26 DIAGNOSIS — I4891 Unspecified atrial fibrillation: Secondary | ICD-10-CM | POA: Diagnosis not present

## 2018-04-26 DIAGNOSIS — I872 Venous insufficiency (chronic) (peripheral): Secondary | ICD-10-CM | POA: Diagnosis not present

## 2018-04-26 DIAGNOSIS — I130 Hypertensive heart and chronic kidney disease with heart failure and stage 1 through stage 4 chronic kidney disease, or unspecified chronic kidney disease: Secondary | ICD-10-CM | POA: Diagnosis not present

## 2018-04-29 DIAGNOSIS — H903 Sensorineural hearing loss, bilateral: Secondary | ICD-10-CM | POA: Diagnosis not present

## 2018-04-29 DIAGNOSIS — I130 Hypertensive heart and chronic kidney disease with heart failure and stage 1 through stage 4 chronic kidney disease, or unspecified chronic kidney disease: Secondary | ICD-10-CM | POA: Diagnosis not present

## 2018-04-29 DIAGNOSIS — I5033 Acute on chronic diastolic (congestive) heart failure: Secondary | ICD-10-CM | POA: Diagnosis not present

## 2018-04-29 DIAGNOSIS — I872 Venous insufficiency (chronic) (peripheral): Secondary | ICD-10-CM | POA: Diagnosis not present

## 2018-04-29 DIAGNOSIS — N183 Chronic kidney disease, stage 3 (moderate): Secondary | ICD-10-CM | POA: Diagnosis not present

## 2018-04-29 DIAGNOSIS — I4891 Unspecified atrial fibrillation: Secondary | ICD-10-CM | POA: Diagnosis not present

## 2018-05-02 ENCOUNTER — Other Ambulatory Visit: Payer: Self-pay | Admitting: Internal Medicine

## 2018-05-02 NOTE — Telephone Encounter (Signed)
Pt last saw Dr Lovena Le 03/18/18, last labs 02/27/18 Creat 1.48, age 84, weight 83.9kg, CrCl 29.45, based on CrCl pt is on appropriate dosage of Pradaxa 75mg  BID.  Will refill rx.

## 2018-05-03 DIAGNOSIS — N183 Chronic kidney disease, stage 3 (moderate): Secondary | ICD-10-CM | POA: Diagnosis not present

## 2018-05-03 DIAGNOSIS — H903 Sensorineural hearing loss, bilateral: Secondary | ICD-10-CM | POA: Diagnosis not present

## 2018-05-03 DIAGNOSIS — I872 Venous insufficiency (chronic) (peripheral): Secondary | ICD-10-CM | POA: Diagnosis not present

## 2018-05-03 DIAGNOSIS — I5033 Acute on chronic diastolic (congestive) heart failure: Secondary | ICD-10-CM | POA: Diagnosis not present

## 2018-05-03 DIAGNOSIS — I4891 Unspecified atrial fibrillation: Secondary | ICD-10-CM | POA: Diagnosis not present

## 2018-05-03 DIAGNOSIS — I130 Hypertensive heart and chronic kidney disease with heart failure and stage 1 through stage 4 chronic kidney disease, or unspecified chronic kidney disease: Secondary | ICD-10-CM | POA: Diagnosis not present

## 2018-05-06 DIAGNOSIS — I872 Venous insufficiency (chronic) (peripheral): Secondary | ICD-10-CM | POA: Diagnosis not present

## 2018-05-06 DIAGNOSIS — I5033 Acute on chronic diastolic (congestive) heart failure: Secondary | ICD-10-CM | POA: Diagnosis not present

## 2018-05-06 DIAGNOSIS — I4891 Unspecified atrial fibrillation: Secondary | ICD-10-CM | POA: Diagnosis not present

## 2018-05-06 DIAGNOSIS — I130 Hypertensive heart and chronic kidney disease with heart failure and stage 1 through stage 4 chronic kidney disease, or unspecified chronic kidney disease: Secondary | ICD-10-CM | POA: Diagnosis not present

## 2018-05-06 DIAGNOSIS — H903 Sensorineural hearing loss, bilateral: Secondary | ICD-10-CM | POA: Diagnosis not present

## 2018-05-06 DIAGNOSIS — N183 Chronic kidney disease, stage 3 (moderate): Secondary | ICD-10-CM | POA: Diagnosis not present

## 2018-05-10 DIAGNOSIS — H903 Sensorineural hearing loss, bilateral: Secondary | ICD-10-CM | POA: Diagnosis not present

## 2018-05-10 DIAGNOSIS — N183 Chronic kidney disease, stage 3 (moderate): Secondary | ICD-10-CM | POA: Diagnosis not present

## 2018-05-10 DIAGNOSIS — I872 Venous insufficiency (chronic) (peripheral): Secondary | ICD-10-CM | POA: Diagnosis not present

## 2018-05-10 DIAGNOSIS — I4891 Unspecified atrial fibrillation: Secondary | ICD-10-CM | POA: Diagnosis not present

## 2018-05-10 DIAGNOSIS — I5033 Acute on chronic diastolic (congestive) heart failure: Secondary | ICD-10-CM | POA: Diagnosis not present

## 2018-05-10 DIAGNOSIS — I130 Hypertensive heart and chronic kidney disease with heart failure and stage 1 through stage 4 chronic kidney disease, or unspecified chronic kidney disease: Secondary | ICD-10-CM | POA: Diagnosis not present

## 2018-05-12 DIAGNOSIS — I130 Hypertensive heart and chronic kidney disease with heart failure and stage 1 through stage 4 chronic kidney disease, or unspecified chronic kidney disease: Secondary | ICD-10-CM | POA: Diagnosis not present

## 2018-05-12 DIAGNOSIS — H903 Sensorineural hearing loss, bilateral: Secondary | ICD-10-CM | POA: Diagnosis not present

## 2018-05-12 DIAGNOSIS — N183 Chronic kidney disease, stage 3 (moderate): Secondary | ICD-10-CM | POA: Diagnosis not present

## 2018-05-12 DIAGNOSIS — I872 Venous insufficiency (chronic) (peripheral): Secondary | ICD-10-CM | POA: Diagnosis not present

## 2018-05-12 DIAGNOSIS — I5033 Acute on chronic diastolic (congestive) heart failure: Secondary | ICD-10-CM | POA: Diagnosis not present

## 2018-05-12 DIAGNOSIS — I4891 Unspecified atrial fibrillation: Secondary | ICD-10-CM | POA: Diagnosis not present

## 2018-05-13 DIAGNOSIS — N183 Chronic kidney disease, stage 3 (moderate): Secondary | ICD-10-CM | POA: Diagnosis not present

## 2018-05-13 DIAGNOSIS — I872 Venous insufficiency (chronic) (peripheral): Secondary | ICD-10-CM | POA: Diagnosis not present

## 2018-05-13 DIAGNOSIS — M1711 Unilateral primary osteoarthritis, right knee: Secondary | ICD-10-CM | POA: Diagnosis not present

## 2018-05-13 DIAGNOSIS — I5033 Acute on chronic diastolic (congestive) heart failure: Secondary | ICD-10-CM | POA: Diagnosis not present

## 2018-05-13 DIAGNOSIS — H903 Sensorineural hearing loss, bilateral: Secondary | ICD-10-CM | POA: Diagnosis not present

## 2018-05-13 DIAGNOSIS — I4891 Unspecified atrial fibrillation: Secondary | ICD-10-CM | POA: Diagnosis not present

## 2018-05-13 DIAGNOSIS — I130 Hypertensive heart and chronic kidney disease with heart failure and stage 1 through stage 4 chronic kidney disease, or unspecified chronic kidney disease: Secondary | ICD-10-CM | POA: Diagnosis not present

## 2018-05-14 DIAGNOSIS — I4891 Unspecified atrial fibrillation: Secondary | ICD-10-CM | POA: Diagnosis not present

## 2018-05-14 DIAGNOSIS — I872 Venous insufficiency (chronic) (peripheral): Secondary | ICD-10-CM | POA: Diagnosis not present

## 2018-05-14 DIAGNOSIS — I130 Hypertensive heart and chronic kidney disease with heart failure and stage 1 through stage 4 chronic kidney disease, or unspecified chronic kidney disease: Secondary | ICD-10-CM | POA: Diagnosis not present

## 2018-05-14 DIAGNOSIS — I5033 Acute on chronic diastolic (congestive) heart failure: Secondary | ICD-10-CM | POA: Diagnosis not present

## 2018-05-14 DIAGNOSIS — N183 Chronic kidney disease, stage 3 (moderate): Secondary | ICD-10-CM | POA: Diagnosis not present

## 2018-05-14 DIAGNOSIS — H903 Sensorineural hearing loss, bilateral: Secondary | ICD-10-CM | POA: Diagnosis not present

## 2018-05-17 DIAGNOSIS — N183 Chronic kidney disease, stage 3 (moderate): Secondary | ICD-10-CM | POA: Diagnosis not present

## 2018-05-17 DIAGNOSIS — I4891 Unspecified atrial fibrillation: Secondary | ICD-10-CM | POA: Diagnosis not present

## 2018-05-17 DIAGNOSIS — I5033 Acute on chronic diastolic (congestive) heart failure: Secondary | ICD-10-CM | POA: Diagnosis not present

## 2018-05-17 DIAGNOSIS — I130 Hypertensive heart and chronic kidney disease with heart failure and stage 1 through stage 4 chronic kidney disease, or unspecified chronic kidney disease: Secondary | ICD-10-CM | POA: Diagnosis not present

## 2018-05-17 DIAGNOSIS — H903 Sensorineural hearing loss, bilateral: Secondary | ICD-10-CM | POA: Diagnosis not present

## 2018-05-17 DIAGNOSIS — I872 Venous insufficiency (chronic) (peripheral): Secondary | ICD-10-CM | POA: Diagnosis not present

## 2018-05-18 DIAGNOSIS — I4891 Unspecified atrial fibrillation: Secondary | ICD-10-CM | POA: Diagnosis not present

## 2018-05-18 DIAGNOSIS — H903 Sensorineural hearing loss, bilateral: Secondary | ICD-10-CM | POA: Diagnosis not present

## 2018-05-18 DIAGNOSIS — N183 Chronic kidney disease, stage 3 (moderate): Secondary | ICD-10-CM | POA: Diagnosis not present

## 2018-05-18 DIAGNOSIS — I872 Venous insufficiency (chronic) (peripheral): Secondary | ICD-10-CM | POA: Diagnosis not present

## 2018-05-18 DIAGNOSIS — I130 Hypertensive heart and chronic kidney disease with heart failure and stage 1 through stage 4 chronic kidney disease, or unspecified chronic kidney disease: Secondary | ICD-10-CM | POA: Diagnosis not present

## 2018-05-18 DIAGNOSIS — I5033 Acute on chronic diastolic (congestive) heart failure: Secondary | ICD-10-CM | POA: Diagnosis not present

## 2018-05-20 DIAGNOSIS — I5033 Acute on chronic diastolic (congestive) heart failure: Secondary | ICD-10-CM | POA: Diagnosis not present

## 2018-05-20 DIAGNOSIS — N183 Chronic kidney disease, stage 3 (moderate): Secondary | ICD-10-CM | POA: Diagnosis not present

## 2018-05-20 DIAGNOSIS — I872 Venous insufficiency (chronic) (peripheral): Secondary | ICD-10-CM | POA: Diagnosis not present

## 2018-05-20 DIAGNOSIS — I4891 Unspecified atrial fibrillation: Secondary | ICD-10-CM | POA: Diagnosis not present

## 2018-05-20 DIAGNOSIS — I130 Hypertensive heart and chronic kidney disease with heart failure and stage 1 through stage 4 chronic kidney disease, or unspecified chronic kidney disease: Secondary | ICD-10-CM | POA: Diagnosis not present

## 2018-05-20 DIAGNOSIS — H903 Sensorineural hearing loss, bilateral: Secondary | ICD-10-CM | POA: Diagnosis not present

## 2018-05-23 DIAGNOSIS — H903 Sensorineural hearing loss, bilateral: Secondary | ICD-10-CM | POA: Diagnosis not present

## 2018-05-23 DIAGNOSIS — N183 Chronic kidney disease, stage 3 (moderate): Secondary | ICD-10-CM | POA: Diagnosis not present

## 2018-05-23 DIAGNOSIS — I872 Venous insufficiency (chronic) (peripheral): Secondary | ICD-10-CM | POA: Diagnosis not present

## 2018-05-23 DIAGNOSIS — I5033 Acute on chronic diastolic (congestive) heart failure: Secondary | ICD-10-CM | POA: Diagnosis not present

## 2018-05-23 DIAGNOSIS — I130 Hypertensive heart and chronic kidney disease with heart failure and stage 1 through stage 4 chronic kidney disease, or unspecified chronic kidney disease: Secondary | ICD-10-CM | POA: Diagnosis not present

## 2018-05-23 DIAGNOSIS — I4891 Unspecified atrial fibrillation: Secondary | ICD-10-CM | POA: Diagnosis not present

## 2018-05-25 DIAGNOSIS — N183 Chronic kidney disease, stage 3 (moderate): Secondary | ICD-10-CM | POA: Diagnosis not present

## 2018-05-25 DIAGNOSIS — I4891 Unspecified atrial fibrillation: Secondary | ICD-10-CM | POA: Diagnosis not present

## 2018-05-25 DIAGNOSIS — I130 Hypertensive heart and chronic kidney disease with heart failure and stage 1 through stage 4 chronic kidney disease, or unspecified chronic kidney disease: Secondary | ICD-10-CM | POA: Diagnosis not present

## 2018-05-25 DIAGNOSIS — H903 Sensorineural hearing loss, bilateral: Secondary | ICD-10-CM | POA: Diagnosis not present

## 2018-05-25 DIAGNOSIS — I5033 Acute on chronic diastolic (congestive) heart failure: Secondary | ICD-10-CM | POA: Diagnosis not present

## 2018-05-25 DIAGNOSIS — I872 Venous insufficiency (chronic) (peripheral): Secondary | ICD-10-CM | POA: Diagnosis not present

## 2018-05-26 DIAGNOSIS — Z974 Presence of external hearing-aid: Secondary | ICD-10-CM | POA: Diagnosis not present

## 2018-05-26 DIAGNOSIS — H9113 Presbycusis, bilateral: Secondary | ICD-10-CM | POA: Diagnosis not present

## 2018-05-26 DIAGNOSIS — H6123 Impacted cerumen, bilateral: Secondary | ICD-10-CM | POA: Diagnosis not present

## 2018-05-27 DIAGNOSIS — I130 Hypertensive heart and chronic kidney disease with heart failure and stage 1 through stage 4 chronic kidney disease, or unspecified chronic kidney disease: Secondary | ICD-10-CM | POA: Diagnosis not present

## 2018-05-27 DIAGNOSIS — I4891 Unspecified atrial fibrillation: Secondary | ICD-10-CM | POA: Diagnosis not present

## 2018-05-27 DIAGNOSIS — N183 Chronic kidney disease, stage 3 (moderate): Secondary | ICD-10-CM | POA: Diagnosis not present

## 2018-05-27 DIAGNOSIS — H903 Sensorineural hearing loss, bilateral: Secondary | ICD-10-CM | POA: Diagnosis not present

## 2018-05-27 DIAGNOSIS — I5033 Acute on chronic diastolic (congestive) heart failure: Secondary | ICD-10-CM | POA: Diagnosis not present

## 2018-05-27 DIAGNOSIS — I872 Venous insufficiency (chronic) (peripheral): Secondary | ICD-10-CM | POA: Diagnosis not present

## 2018-05-31 DIAGNOSIS — H903 Sensorineural hearing loss, bilateral: Secondary | ICD-10-CM | POA: Diagnosis not present

## 2018-05-31 DIAGNOSIS — N183 Chronic kidney disease, stage 3 (moderate): Secondary | ICD-10-CM | POA: Diagnosis not present

## 2018-05-31 DIAGNOSIS — I4891 Unspecified atrial fibrillation: Secondary | ICD-10-CM | POA: Diagnosis not present

## 2018-05-31 DIAGNOSIS — I130 Hypertensive heart and chronic kidney disease with heart failure and stage 1 through stage 4 chronic kidney disease, or unspecified chronic kidney disease: Secondary | ICD-10-CM | POA: Diagnosis not present

## 2018-05-31 DIAGNOSIS — I872 Venous insufficiency (chronic) (peripheral): Secondary | ICD-10-CM | POA: Diagnosis not present

## 2018-05-31 DIAGNOSIS — I5033 Acute on chronic diastolic (congestive) heart failure: Secondary | ICD-10-CM | POA: Diagnosis not present

## 2018-06-01 DIAGNOSIS — I5033 Acute on chronic diastolic (congestive) heart failure: Secondary | ICD-10-CM | POA: Diagnosis not present

## 2018-06-01 DIAGNOSIS — N183 Chronic kidney disease, stage 3 (moderate): Secondary | ICD-10-CM | POA: Diagnosis not present

## 2018-06-01 DIAGNOSIS — I4891 Unspecified atrial fibrillation: Secondary | ICD-10-CM | POA: Diagnosis not present

## 2018-06-01 DIAGNOSIS — I872 Venous insufficiency (chronic) (peripheral): Secondary | ICD-10-CM | POA: Diagnosis not present

## 2018-06-01 DIAGNOSIS — I130 Hypertensive heart and chronic kidney disease with heart failure and stage 1 through stage 4 chronic kidney disease, or unspecified chronic kidney disease: Secondary | ICD-10-CM | POA: Diagnosis not present

## 2018-06-01 DIAGNOSIS — H903 Sensorineural hearing loss, bilateral: Secondary | ICD-10-CM | POA: Diagnosis not present

## 2018-06-03 DIAGNOSIS — H903 Sensorineural hearing loss, bilateral: Secondary | ICD-10-CM | POA: Diagnosis not present

## 2018-06-03 DIAGNOSIS — I872 Venous insufficiency (chronic) (peripheral): Secondary | ICD-10-CM | POA: Diagnosis not present

## 2018-06-03 DIAGNOSIS — I5033 Acute on chronic diastolic (congestive) heart failure: Secondary | ICD-10-CM | POA: Diagnosis not present

## 2018-06-03 DIAGNOSIS — I130 Hypertensive heart and chronic kidney disease with heart failure and stage 1 through stage 4 chronic kidney disease, or unspecified chronic kidney disease: Secondary | ICD-10-CM | POA: Diagnosis not present

## 2018-06-03 DIAGNOSIS — I4891 Unspecified atrial fibrillation: Secondary | ICD-10-CM | POA: Diagnosis not present

## 2018-06-03 DIAGNOSIS — N183 Chronic kidney disease, stage 3 (moderate): Secondary | ICD-10-CM | POA: Diagnosis not present

## 2018-06-07 DIAGNOSIS — I4891 Unspecified atrial fibrillation: Secondary | ICD-10-CM | POA: Diagnosis not present

## 2018-06-07 DIAGNOSIS — K5909 Other constipation: Secondary | ICD-10-CM | POA: Diagnosis not present

## 2018-06-07 DIAGNOSIS — E669 Obesity, unspecified: Secondary | ICD-10-CM | POA: Diagnosis not present

## 2018-06-07 DIAGNOSIS — H903 Sensorineural hearing loss, bilateral: Secondary | ICD-10-CM | POA: Diagnosis not present

## 2018-06-07 DIAGNOSIS — I130 Hypertensive heart and chronic kidney disease with heart failure and stage 1 through stage 4 chronic kidney disease, or unspecified chronic kidney disease: Secondary | ICD-10-CM | POA: Diagnosis not present

## 2018-06-07 DIAGNOSIS — I5033 Acute on chronic diastolic (congestive) heart failure: Secondary | ICD-10-CM | POA: Diagnosis not present

## 2018-06-07 DIAGNOSIS — E668 Other obesity: Secondary | ICD-10-CM | POA: Diagnosis not present

## 2018-06-07 DIAGNOSIS — I8393 Asymptomatic varicose veins of bilateral lower extremities: Secondary | ICD-10-CM | POA: Diagnosis not present

## 2018-06-07 DIAGNOSIS — R32 Unspecified urinary incontinence: Secondary | ICD-10-CM | POA: Diagnosis not present

## 2018-06-07 DIAGNOSIS — E785 Hyperlipidemia, unspecified: Secondary | ICD-10-CM | POA: Diagnosis not present

## 2018-06-07 DIAGNOSIS — Z87891 Personal history of nicotine dependence: Secondary | ICD-10-CM | POA: Diagnosis not present

## 2018-06-07 DIAGNOSIS — N183 Chronic kidney disease, stage 3 (moderate): Secondary | ICD-10-CM | POA: Diagnosis not present

## 2018-06-07 DIAGNOSIS — E7849 Other hyperlipidemia: Secondary | ICD-10-CM | POA: Diagnosis not present

## 2018-06-07 DIAGNOSIS — K5900 Constipation, unspecified: Secondary | ICD-10-CM | POA: Diagnosis not present

## 2018-06-07 DIAGNOSIS — M25561 Pain in right knee: Secondary | ICD-10-CM | POA: Diagnosis not present

## 2018-06-07 DIAGNOSIS — Z6831 Body mass index (BMI) 31.0-31.9, adult: Secondary | ICD-10-CM | POA: Diagnosis not present

## 2018-06-09 DIAGNOSIS — I8393 Asymptomatic varicose veins of bilateral lower extremities: Secondary | ICD-10-CM | POA: Diagnosis not present

## 2018-06-09 DIAGNOSIS — N183 Chronic kidney disease, stage 3 (moderate): Secondary | ICD-10-CM | POA: Diagnosis not present

## 2018-06-09 DIAGNOSIS — M25561 Pain in right knee: Secondary | ICD-10-CM | POA: Diagnosis not present

## 2018-06-09 DIAGNOSIS — I130 Hypertensive heart and chronic kidney disease with heart failure and stage 1 through stage 4 chronic kidney disease, or unspecified chronic kidney disease: Secondary | ICD-10-CM | POA: Diagnosis not present

## 2018-06-09 DIAGNOSIS — I4891 Unspecified atrial fibrillation: Secondary | ICD-10-CM | POA: Diagnosis not present

## 2018-06-09 DIAGNOSIS — I5033 Acute on chronic diastolic (congestive) heart failure: Secondary | ICD-10-CM | POA: Diagnosis not present

## 2018-06-10 DIAGNOSIS — R82998 Other abnormal findings in urine: Secondary | ICD-10-CM | POA: Diagnosis not present

## 2018-06-10 DIAGNOSIS — E7849 Other hyperlipidemia: Secondary | ICD-10-CM | POA: Diagnosis not present

## 2018-06-10 DIAGNOSIS — I10 Essential (primary) hypertension: Secondary | ICD-10-CM | POA: Diagnosis not present

## 2018-06-13 ENCOUNTER — Ambulatory Visit: Payer: Commercial Indemnity | Admitting: Internal Medicine

## 2018-06-15 DIAGNOSIS — M25561 Pain in right knee: Secondary | ICD-10-CM | POA: Diagnosis not present

## 2018-06-15 DIAGNOSIS — I8393 Asymptomatic varicose veins of bilateral lower extremities: Secondary | ICD-10-CM | POA: Diagnosis not present

## 2018-06-15 DIAGNOSIS — I5033 Acute on chronic diastolic (congestive) heart failure: Secondary | ICD-10-CM | POA: Diagnosis not present

## 2018-06-15 DIAGNOSIS — I130 Hypertensive heart and chronic kidney disease with heart failure and stage 1 through stage 4 chronic kidney disease, or unspecified chronic kidney disease: Secondary | ICD-10-CM | POA: Diagnosis not present

## 2018-06-15 DIAGNOSIS — N183 Chronic kidney disease, stage 3 (moderate): Secondary | ICD-10-CM | POA: Diagnosis not present

## 2018-06-15 DIAGNOSIS — I4891 Unspecified atrial fibrillation: Secondary | ICD-10-CM | POA: Diagnosis not present

## 2018-06-16 DIAGNOSIS — H353221 Exudative age-related macular degeneration, left eye, with active choroidal neovascularization: Secondary | ICD-10-CM | POA: Diagnosis not present

## 2018-06-16 DIAGNOSIS — I4891 Unspecified atrial fibrillation: Secondary | ICD-10-CM | POA: Diagnosis not present

## 2018-06-16 DIAGNOSIS — M25561 Pain in right knee: Secondary | ICD-10-CM | POA: Diagnosis not present

## 2018-06-16 DIAGNOSIS — I8393 Asymptomatic varicose veins of bilateral lower extremities: Secondary | ICD-10-CM | POA: Diagnosis not present

## 2018-06-16 DIAGNOSIS — N183 Chronic kidney disease, stage 3 (moderate): Secondary | ICD-10-CM | POA: Diagnosis not present

## 2018-06-16 DIAGNOSIS — I130 Hypertensive heart and chronic kidney disease with heart failure and stage 1 through stage 4 chronic kidney disease, or unspecified chronic kidney disease: Secondary | ICD-10-CM | POA: Diagnosis not present

## 2018-06-16 DIAGNOSIS — H353213 Exudative age-related macular degeneration, right eye, with inactive scar: Secondary | ICD-10-CM | POA: Diagnosis not present

## 2018-06-16 DIAGNOSIS — I5033 Acute on chronic diastolic (congestive) heart failure: Secondary | ICD-10-CM | POA: Diagnosis not present

## 2018-06-16 IMAGING — CR DG CHEST 2V
2 series · 2 of 2 positions shown · non-contrast
Comparison: November 04, 2016

CLINICAL DATA: Chest pain and shortness of breath.  Follow-up.

EXAM:
CHEST  2 VIEW

[chest lat]
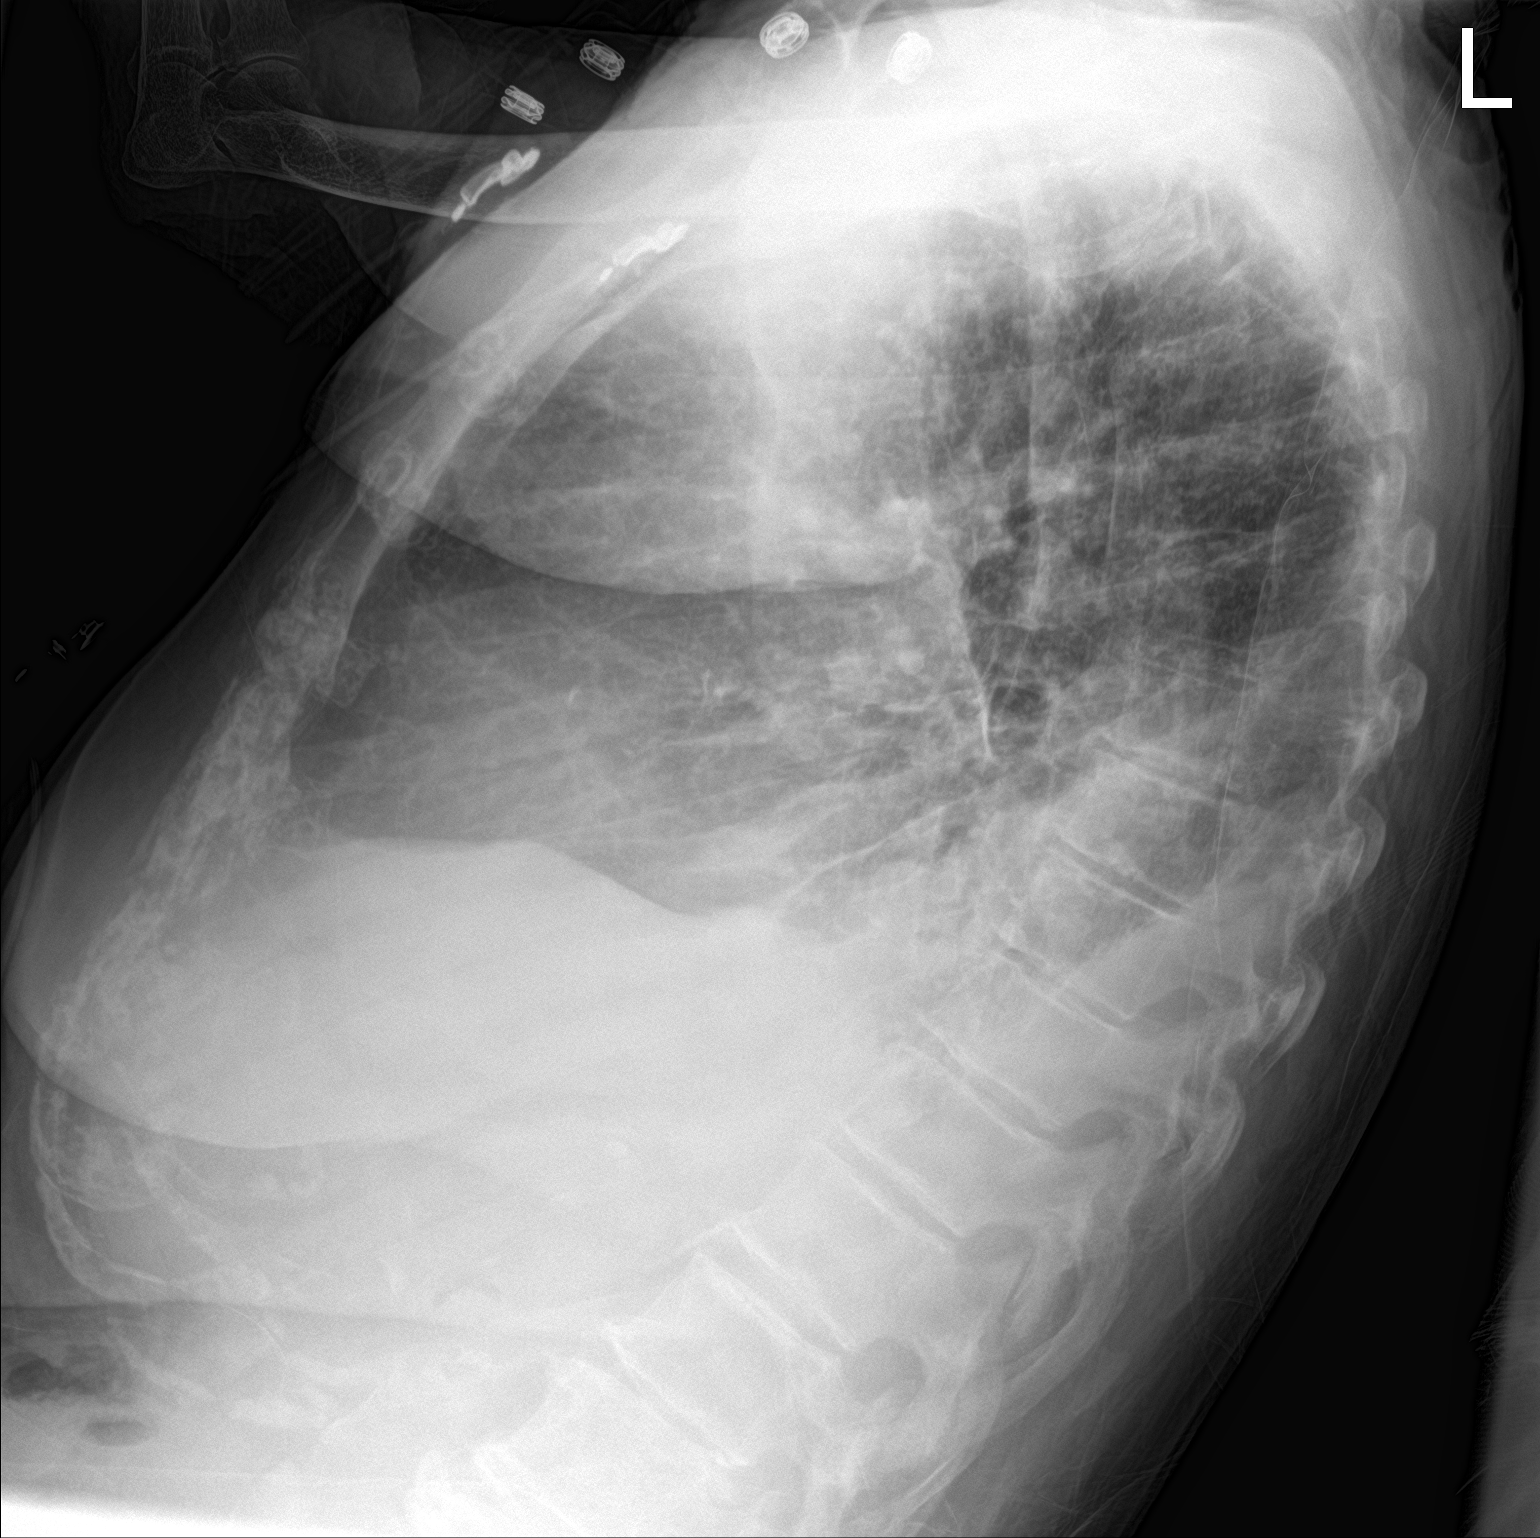

[chest ap]
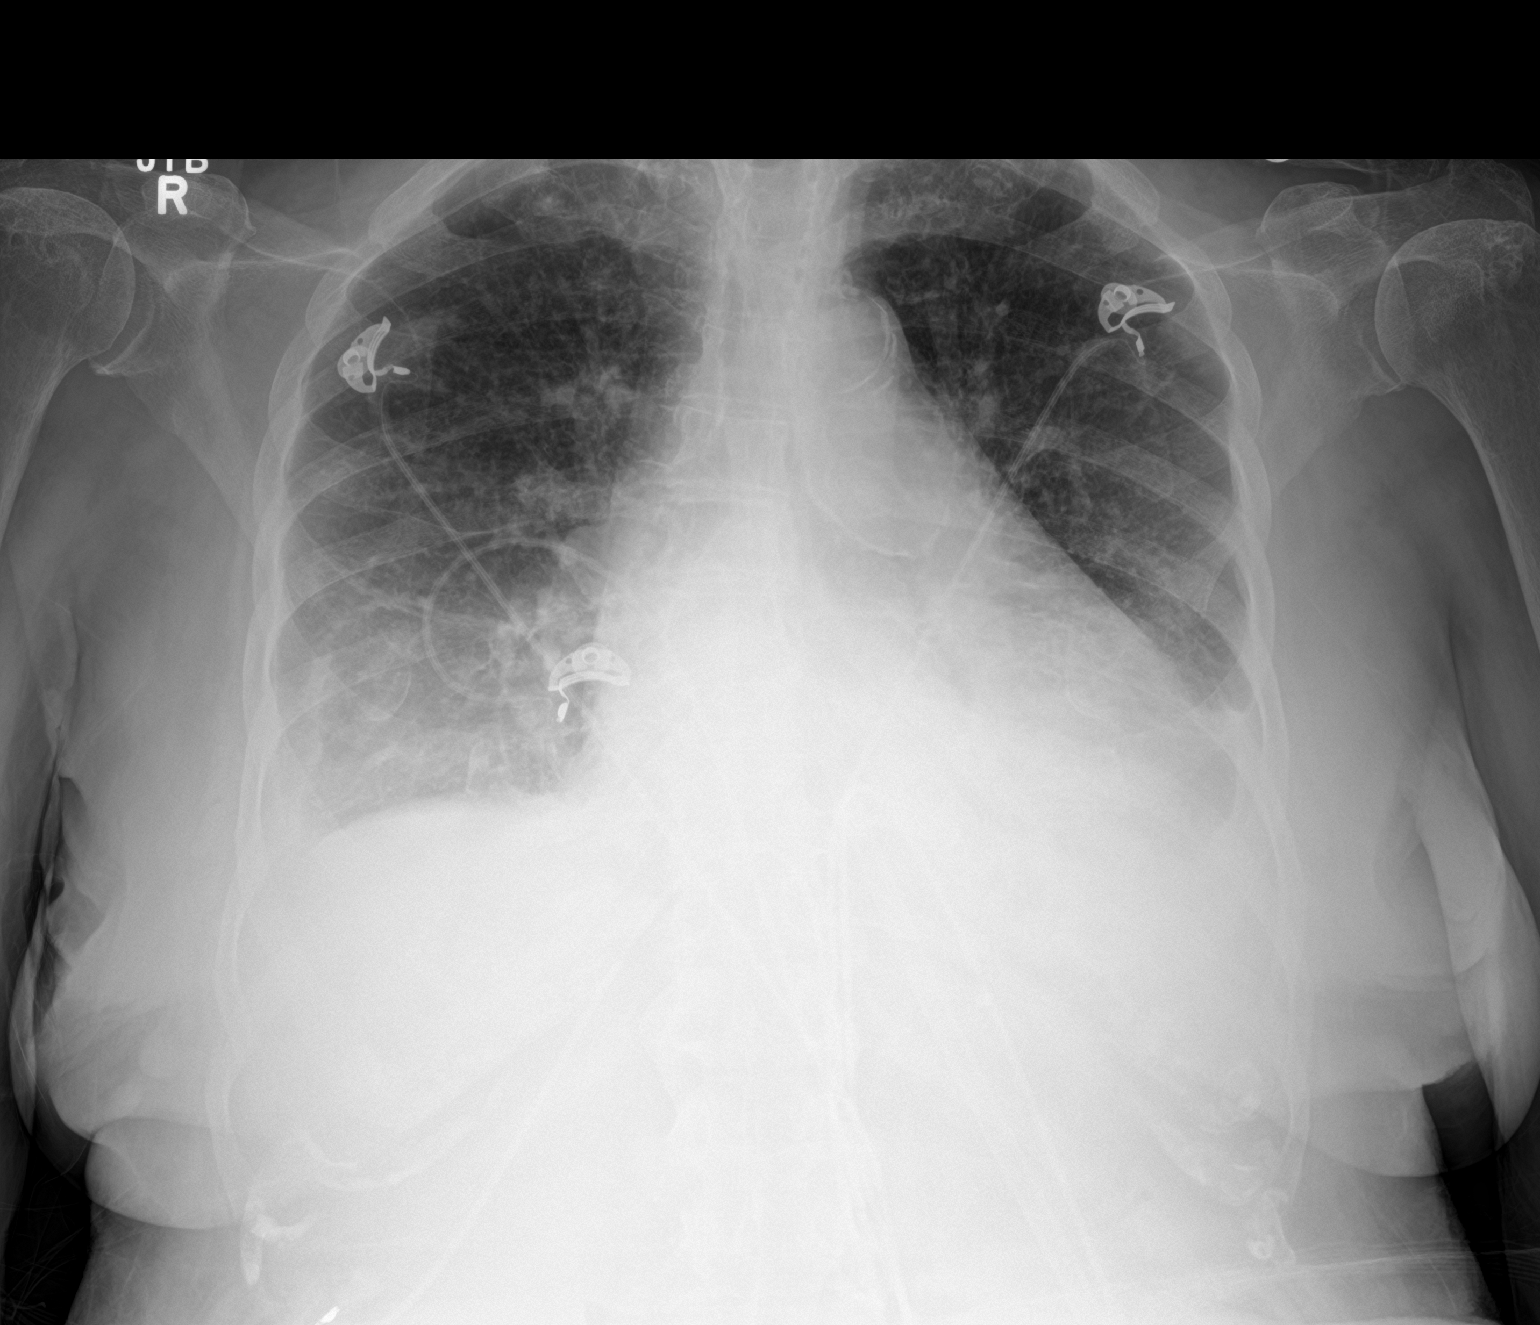

[2 of 2 positions shown; findings below may reference images not displayed]

FINDINGS: Cardiomegaly. Bilateral pleural effusions and pulmonary edema
remain. No other acute interval changes.
IMPRESSION: Cardiomegaly with bilateral pleural effusions and mild edema.

## 2018-06-17 DIAGNOSIS — M25561 Pain in right knee: Secondary | ICD-10-CM | POA: Diagnosis not present

## 2018-06-17 DIAGNOSIS — I4891 Unspecified atrial fibrillation: Secondary | ICD-10-CM | POA: Diagnosis not present

## 2018-06-17 DIAGNOSIS — I8393 Asymptomatic varicose veins of bilateral lower extremities: Secondary | ICD-10-CM | POA: Diagnosis not present

## 2018-06-17 DIAGNOSIS — I130 Hypertensive heart and chronic kidney disease with heart failure and stage 1 through stage 4 chronic kidney disease, or unspecified chronic kidney disease: Secondary | ICD-10-CM | POA: Diagnosis not present

## 2018-06-17 DIAGNOSIS — I5033 Acute on chronic diastolic (congestive) heart failure: Secondary | ICD-10-CM | POA: Diagnosis not present

## 2018-06-17 DIAGNOSIS — N183 Chronic kidney disease, stage 3 (moderate): Secondary | ICD-10-CM | POA: Diagnosis not present

## 2018-06-20 DIAGNOSIS — I130 Hypertensive heart and chronic kidney disease with heart failure and stage 1 through stage 4 chronic kidney disease, or unspecified chronic kidney disease: Secondary | ICD-10-CM | POA: Diagnosis not present

## 2018-06-20 DIAGNOSIS — I5033 Acute on chronic diastolic (congestive) heart failure: Secondary | ICD-10-CM | POA: Diagnosis not present

## 2018-06-20 DIAGNOSIS — M25561 Pain in right knee: Secondary | ICD-10-CM | POA: Diagnosis not present

## 2018-06-20 DIAGNOSIS — I8393 Asymptomatic varicose veins of bilateral lower extremities: Secondary | ICD-10-CM | POA: Diagnosis not present

## 2018-06-20 DIAGNOSIS — I4891 Unspecified atrial fibrillation: Secondary | ICD-10-CM | POA: Diagnosis not present

## 2018-06-20 DIAGNOSIS — N183 Chronic kidney disease, stage 3 (moderate): Secondary | ICD-10-CM | POA: Diagnosis not present

## 2018-06-22 DIAGNOSIS — I130 Hypertensive heart and chronic kidney disease with heart failure and stage 1 through stage 4 chronic kidney disease, or unspecified chronic kidney disease: Secondary | ICD-10-CM | POA: Diagnosis not present

## 2018-06-22 DIAGNOSIS — N183 Chronic kidney disease, stage 3 (moderate): Secondary | ICD-10-CM | POA: Diagnosis not present

## 2018-06-22 DIAGNOSIS — I8393 Asymptomatic varicose veins of bilateral lower extremities: Secondary | ICD-10-CM | POA: Diagnosis not present

## 2018-06-22 DIAGNOSIS — I5033 Acute on chronic diastolic (congestive) heart failure: Secondary | ICD-10-CM | POA: Diagnosis not present

## 2018-06-22 DIAGNOSIS — I4891 Unspecified atrial fibrillation: Secondary | ICD-10-CM | POA: Diagnosis not present

## 2018-06-22 DIAGNOSIS — M25561 Pain in right knee: Secondary | ICD-10-CM | POA: Diagnosis not present

## 2018-06-23 DIAGNOSIS — I8393 Asymptomatic varicose veins of bilateral lower extremities: Secondary | ICD-10-CM | POA: Diagnosis not present

## 2018-06-23 DIAGNOSIS — I4891 Unspecified atrial fibrillation: Secondary | ICD-10-CM | POA: Diagnosis not present

## 2018-06-23 DIAGNOSIS — N183 Chronic kidney disease, stage 3 (moderate): Secondary | ICD-10-CM | POA: Diagnosis not present

## 2018-06-23 DIAGNOSIS — M25561 Pain in right knee: Secondary | ICD-10-CM | POA: Diagnosis not present

## 2018-06-23 DIAGNOSIS — I130 Hypertensive heart and chronic kidney disease with heart failure and stage 1 through stage 4 chronic kidney disease, or unspecified chronic kidney disease: Secondary | ICD-10-CM | POA: Diagnosis not present

## 2018-06-23 DIAGNOSIS — I5033 Acute on chronic diastolic (congestive) heart failure: Secondary | ICD-10-CM | POA: Diagnosis not present

## 2018-06-24 DIAGNOSIS — I130 Hypertensive heart and chronic kidney disease with heart failure and stage 1 through stage 4 chronic kidney disease, or unspecified chronic kidney disease: Secondary | ICD-10-CM | POA: Diagnosis not present

## 2018-06-24 DIAGNOSIS — I5033 Acute on chronic diastolic (congestive) heart failure: Secondary | ICD-10-CM | POA: Diagnosis not present

## 2018-06-24 DIAGNOSIS — I8393 Asymptomatic varicose veins of bilateral lower extremities: Secondary | ICD-10-CM | POA: Diagnosis not present

## 2018-06-24 DIAGNOSIS — M25561 Pain in right knee: Secondary | ICD-10-CM | POA: Diagnosis not present

## 2018-06-24 DIAGNOSIS — I4891 Unspecified atrial fibrillation: Secondary | ICD-10-CM | POA: Diagnosis not present

## 2018-06-24 DIAGNOSIS — N183 Chronic kidney disease, stage 3 (moderate): Secondary | ICD-10-CM | POA: Diagnosis not present

## 2018-06-27 DIAGNOSIS — I8393 Asymptomatic varicose veins of bilateral lower extremities: Secondary | ICD-10-CM | POA: Diagnosis not present

## 2018-06-27 DIAGNOSIS — I130 Hypertensive heart and chronic kidney disease with heart failure and stage 1 through stage 4 chronic kidney disease, or unspecified chronic kidney disease: Secondary | ICD-10-CM | POA: Diagnosis not present

## 2018-06-27 DIAGNOSIS — N183 Chronic kidney disease, stage 3 (moderate): Secondary | ICD-10-CM | POA: Diagnosis not present

## 2018-06-27 DIAGNOSIS — M25561 Pain in right knee: Secondary | ICD-10-CM | POA: Diagnosis not present

## 2018-06-27 DIAGNOSIS — I4891 Unspecified atrial fibrillation: Secondary | ICD-10-CM | POA: Diagnosis not present

## 2018-06-27 DIAGNOSIS — I5033 Acute on chronic diastolic (congestive) heart failure: Secondary | ICD-10-CM | POA: Diagnosis not present

## 2018-06-30 DIAGNOSIS — I5033 Acute on chronic diastolic (congestive) heart failure: Secondary | ICD-10-CM | POA: Diagnosis not present

## 2018-06-30 DIAGNOSIS — M25561 Pain in right knee: Secondary | ICD-10-CM | POA: Diagnosis not present

## 2018-06-30 DIAGNOSIS — I8393 Asymptomatic varicose veins of bilateral lower extremities: Secondary | ICD-10-CM | POA: Diagnosis not present

## 2018-06-30 DIAGNOSIS — I4891 Unspecified atrial fibrillation: Secondary | ICD-10-CM | POA: Diagnosis not present

## 2018-06-30 DIAGNOSIS — N183 Chronic kidney disease, stage 3 (moderate): Secondary | ICD-10-CM | POA: Diagnosis not present

## 2018-06-30 DIAGNOSIS — I130 Hypertensive heart and chronic kidney disease with heart failure and stage 1 through stage 4 chronic kidney disease, or unspecified chronic kidney disease: Secondary | ICD-10-CM | POA: Diagnosis not present

## 2018-07-01 DIAGNOSIS — N183 Chronic kidney disease, stage 3 (moderate): Secondary | ICD-10-CM | POA: Diagnosis not present

## 2018-07-01 DIAGNOSIS — M25561 Pain in right knee: Secondary | ICD-10-CM | POA: Diagnosis not present

## 2018-07-01 DIAGNOSIS — I5033 Acute on chronic diastolic (congestive) heart failure: Secondary | ICD-10-CM | POA: Diagnosis not present

## 2018-07-01 DIAGNOSIS — I130 Hypertensive heart and chronic kidney disease with heart failure and stage 1 through stage 4 chronic kidney disease, or unspecified chronic kidney disease: Secondary | ICD-10-CM | POA: Diagnosis not present

## 2018-07-01 DIAGNOSIS — I8393 Asymptomatic varicose veins of bilateral lower extremities: Secondary | ICD-10-CM | POA: Diagnosis not present

## 2018-07-01 DIAGNOSIS — I4891 Unspecified atrial fibrillation: Secondary | ICD-10-CM | POA: Diagnosis not present

## 2018-07-07 DIAGNOSIS — N183 Chronic kidney disease, stage 3 (moderate): Secondary | ICD-10-CM | POA: Diagnosis not present

## 2018-07-07 DIAGNOSIS — I130 Hypertensive heart and chronic kidney disease with heart failure and stage 1 through stage 4 chronic kidney disease, or unspecified chronic kidney disease: Secondary | ICD-10-CM | POA: Diagnosis not present

## 2018-07-07 DIAGNOSIS — M25561 Pain in right knee: Secondary | ICD-10-CM | POA: Diagnosis not present

## 2018-07-07 DIAGNOSIS — I5033 Acute on chronic diastolic (congestive) heart failure: Secondary | ICD-10-CM | POA: Diagnosis not present

## 2018-07-07 DIAGNOSIS — I4891 Unspecified atrial fibrillation: Secondary | ICD-10-CM | POA: Diagnosis not present

## 2018-07-07 DIAGNOSIS — I8393 Asymptomatic varicose veins of bilateral lower extremities: Secondary | ICD-10-CM | POA: Diagnosis not present

## 2018-07-12 DIAGNOSIS — Z85828 Personal history of other malignant neoplasm of skin: Secondary | ICD-10-CM | POA: Diagnosis not present

## 2018-07-12 DIAGNOSIS — L720 Epidermal cyst: Secondary | ICD-10-CM | POA: Diagnosis not present

## 2018-07-12 DIAGNOSIS — L02414 Cutaneous abscess of left upper limb: Secondary | ICD-10-CM | POA: Diagnosis not present

## 2018-07-14 DIAGNOSIS — M25561 Pain in right knee: Secondary | ICD-10-CM | POA: Diagnosis not present

## 2018-07-14 DIAGNOSIS — I5033 Acute on chronic diastolic (congestive) heart failure: Secondary | ICD-10-CM | POA: Diagnosis not present

## 2018-07-14 DIAGNOSIS — I872 Venous insufficiency (chronic) (peripheral): Secondary | ICD-10-CM | POA: Diagnosis not present

## 2018-07-14 DIAGNOSIS — S31829A Unspecified open wound of left buttock, initial encounter: Secondary | ICD-10-CM | POA: Diagnosis not present

## 2018-07-14 DIAGNOSIS — I130 Hypertensive heart and chronic kidney disease with heart failure and stage 1 through stage 4 chronic kidney disease, or unspecified chronic kidney disease: Secondary | ICD-10-CM | POA: Diagnosis not present

## 2018-07-14 DIAGNOSIS — R600 Localized edema: Secondary | ICD-10-CM | POA: Diagnosis not present

## 2018-07-14 DIAGNOSIS — I8393 Asymptomatic varicose veins of bilateral lower extremities: Secondary | ICD-10-CM | POA: Diagnosis not present

## 2018-07-14 DIAGNOSIS — N183 Chronic kidney disease, stage 3 (moderate): Secondary | ICD-10-CM | POA: Diagnosis not present

## 2018-07-14 DIAGNOSIS — Z6831 Body mass index (BMI) 31.0-31.9, adult: Secondary | ICD-10-CM | POA: Diagnosis not present

## 2018-07-14 DIAGNOSIS — I4891 Unspecified atrial fibrillation: Secondary | ICD-10-CM | POA: Diagnosis not present

## 2018-07-14 DIAGNOSIS — I5032 Chronic diastolic (congestive) heart failure: Secondary | ICD-10-CM | POA: Diagnosis not present

## 2018-07-14 DIAGNOSIS — L03119 Cellulitis of unspecified part of limb: Secondary | ICD-10-CM | POA: Diagnosis not present

## 2018-07-18 DIAGNOSIS — L03119 Cellulitis of unspecified part of limb: Secondary | ICD-10-CM | POA: Diagnosis not present

## 2018-07-18 DIAGNOSIS — Z6831 Body mass index (BMI) 31.0-31.9, adult: Secondary | ICD-10-CM | POA: Diagnosis not present

## 2018-07-18 DIAGNOSIS — I872 Venous insufficiency (chronic) (peripheral): Secondary | ICD-10-CM | POA: Diagnosis not present

## 2018-08-08 DIAGNOSIS — L720 Epidermal cyst: Secondary | ICD-10-CM | POA: Diagnosis not present

## 2018-08-08 DIAGNOSIS — C44622 Squamous cell carcinoma of skin of right upper limb, including shoulder: Secondary | ICD-10-CM | POA: Diagnosis not present

## 2018-08-08 DIAGNOSIS — D485 Neoplasm of uncertain behavior of skin: Secondary | ICD-10-CM | POA: Diagnosis not present

## 2018-08-08 DIAGNOSIS — Z85828 Personal history of other malignant neoplasm of skin: Secondary | ICD-10-CM | POA: Diagnosis not present

## 2018-08-18 DIAGNOSIS — H353221 Exudative age-related macular degeneration, left eye, with active choroidal neovascularization: Secondary | ICD-10-CM | POA: Diagnosis not present

## 2018-09-08 DIAGNOSIS — Z974 Presence of external hearing-aid: Secondary | ICD-10-CM | POA: Diagnosis not present

## 2018-09-08 DIAGNOSIS — H6123 Impacted cerumen, bilateral: Secondary | ICD-10-CM | POA: Diagnosis not present

## 2018-09-08 DIAGNOSIS — H9113 Presbycusis, bilateral: Secondary | ICD-10-CM | POA: Diagnosis not present

## 2018-12-12 ENCOUNTER — Ambulatory Visit (INDEPENDENT_AMBULATORY_CARE_PROVIDER_SITE_OTHER): Payer: Medicare Other | Admitting: Podiatry

## 2018-12-12 ENCOUNTER — Other Ambulatory Visit: Payer: Self-pay

## 2018-12-12 VITALS — Temp 96.6°F

## 2018-12-12 DIAGNOSIS — M79676 Pain in unspecified toe(s): Secondary | ICD-10-CM

## 2018-12-12 DIAGNOSIS — B351 Tinea unguium: Secondary | ICD-10-CM

## 2018-12-13 DIAGNOSIS — H6123 Impacted cerumen, bilateral: Secondary | ICD-10-CM | POA: Diagnosis not present

## 2018-12-18 NOTE — Progress Notes (Signed)
.  bme   SUBJECTIVE Patient presents to office today complaining of elongated, thickened nails that cause pain while ambulating in shoes. She is unable to trim her own nails. Patient is here for further evaluation and treatment.  Past Medical History:  Diagnosis Date  . Anemia   . Asthma   . Cellulitis   . Chronic acquired lymphedema   . Chronic venous insufficiency   . Dysrhythmia   . Hyperlipidemia   . Hypertension     OBJECTIVE General Patient is awake, alert, and oriented x 3 and in no acute distress. Derm Skin is dry and supple bilateral. Negative open lesions or macerations. Remaining integument unremarkable. Nails are tender, long, thickened and dystrophic with subungual debris, consistent with onychomycosis, 1-5 bilateral. No signs of infection noted. Vasc  DP and PT pedal pulses palpable bilaterally. Temperature gradient within normal limits.  Neuro Epicritic and protective threshold sensation grossly intact bilaterally.  Musculoskeletal Exam No symptomatic pedal deformities noted bilateral. Muscular strength within normal limits.  ASSESSMENT 1. Onychodystrophic nails 1-5 bilateral with hyperkeratosis of nails.  2. Onychomycosis of nail due to dermatophyte bilateral 3. Pain in foot bilateral  PLAN OF CARE 1. Patient evaluated today.  2. Instructed to maintain good pedal hygiene and foot care.  3. Mechanical debridement of nails 1-5 bilaterally performed using a nail nipper. Filed with dremel without incident.  4. Return to clinic in 3 mos.    Edrick Kins, DPM [facility]  Dr. Edrick Kins, [place]                                        [place], [place] [address]                Office [phone]  Fax [phone]

## 2019-01-02 DIAGNOSIS — I87313 Chronic venous hypertension (idiopathic) with ulcer of bilateral lower extremity: Secondary | ICD-10-CM | POA: Diagnosis not present

## 2019-01-02 DIAGNOSIS — L03116 Cellulitis of left lower limb: Secondary | ICD-10-CM | POA: Diagnosis not present

## 2019-01-02 DIAGNOSIS — L03115 Cellulitis of right lower limb: Secondary | ICD-10-CM | POA: Diagnosis not present

## 2019-01-05 DIAGNOSIS — S31829A Unspecified open wound of left buttock, initial encounter: Secondary | ICD-10-CM | POA: Diagnosis not present

## 2019-01-05 DIAGNOSIS — I872 Venous insufficiency (chronic) (peripheral): Secondary | ICD-10-CM | POA: Diagnosis not present

## 2019-01-05 DIAGNOSIS — I87319 Chronic venous hypertension (idiopathic) with ulcer of unspecified lower extremity: Secondary | ICD-10-CM | POA: Diagnosis not present

## 2019-01-05 DIAGNOSIS — R600 Localized edema: Secondary | ICD-10-CM | POA: Diagnosis not present

## 2019-01-06 DIAGNOSIS — H903 Sensorineural hearing loss, bilateral: Secondary | ICD-10-CM | POA: Diagnosis not present

## 2019-01-06 DIAGNOSIS — R32 Unspecified urinary incontinence: Secondary | ICD-10-CM | POA: Diagnosis not present

## 2019-01-06 DIAGNOSIS — E785 Hyperlipidemia, unspecified: Secondary | ICD-10-CM | POA: Diagnosis not present

## 2019-01-06 DIAGNOSIS — H3530 Unspecified macular degeneration: Secondary | ICD-10-CM | POA: Diagnosis not present

## 2019-01-06 DIAGNOSIS — L03115 Cellulitis of right lower limb: Secondary | ICD-10-CM | POA: Diagnosis not present

## 2019-01-06 DIAGNOSIS — L03116 Cellulitis of left lower limb: Secondary | ICD-10-CM | POA: Diagnosis not present

## 2019-01-06 DIAGNOSIS — I4891 Unspecified atrial fibrillation: Secondary | ICD-10-CM | POA: Diagnosis not present

## 2019-01-06 DIAGNOSIS — I110 Hypertensive heart disease with heart failure: Secondary | ICD-10-CM | POA: Diagnosis not present

## 2019-01-06 DIAGNOSIS — Z7901 Long term (current) use of anticoagulants: Secondary | ICD-10-CM | POA: Diagnosis not present

## 2019-01-06 DIAGNOSIS — E789 Disorder of lipoprotein metabolism, unspecified: Secondary | ICD-10-CM | POA: Diagnosis not present

## 2019-01-06 DIAGNOSIS — I87319 Chronic venous hypertension (idiopathic) with ulcer of unspecified lower extremity: Secondary | ICD-10-CM | POA: Diagnosis not present

## 2019-01-06 DIAGNOSIS — I509 Heart failure, unspecified: Secondary | ICD-10-CM | POA: Diagnosis not present

## 2019-01-11 DIAGNOSIS — I110 Hypertensive heart disease with heart failure: Secondary | ICD-10-CM | POA: Diagnosis not present

## 2019-01-11 DIAGNOSIS — I509 Heart failure, unspecified: Secondary | ICD-10-CM | POA: Diagnosis not present

## 2019-01-11 DIAGNOSIS — L03115 Cellulitis of right lower limb: Secondary | ICD-10-CM | POA: Diagnosis not present

## 2019-01-11 DIAGNOSIS — I87319 Chronic venous hypertension (idiopathic) with ulcer of unspecified lower extremity: Secondary | ICD-10-CM | POA: Diagnosis not present

## 2019-01-11 DIAGNOSIS — L03116 Cellulitis of left lower limb: Secondary | ICD-10-CM | POA: Diagnosis not present

## 2019-01-11 DIAGNOSIS — I4891 Unspecified atrial fibrillation: Secondary | ICD-10-CM | POA: Diagnosis not present

## 2019-01-12 DIAGNOSIS — H353213 Exudative age-related macular degeneration, right eye, with inactive scar: Secondary | ICD-10-CM | POA: Diagnosis not present

## 2019-01-12 DIAGNOSIS — H401122 Primary open-angle glaucoma, left eye, moderate stage: Secondary | ICD-10-CM | POA: Diagnosis not present

## 2019-01-12 DIAGNOSIS — H31011 Macula scars of posterior pole (postinflammatory) (post-traumatic), right eye: Secondary | ICD-10-CM | POA: Diagnosis not present

## 2019-01-12 DIAGNOSIS — H353221 Exudative age-related macular degeneration, left eye, with active choroidal neovascularization: Secondary | ICD-10-CM | POA: Diagnosis not present

## 2019-01-12 DIAGNOSIS — Z961 Presence of intraocular lens: Secondary | ICD-10-CM | POA: Diagnosis not present

## 2019-01-19 DIAGNOSIS — I509 Heart failure, unspecified: Secondary | ICD-10-CM | POA: Diagnosis not present

## 2019-01-19 DIAGNOSIS — L03115 Cellulitis of right lower limb: Secondary | ICD-10-CM | POA: Diagnosis not present

## 2019-01-19 DIAGNOSIS — L03116 Cellulitis of left lower limb: Secondary | ICD-10-CM | POA: Diagnosis not present

## 2019-01-19 DIAGNOSIS — I4891 Unspecified atrial fibrillation: Secondary | ICD-10-CM | POA: Diagnosis not present

## 2019-01-19 DIAGNOSIS — I87319 Chronic venous hypertension (idiopathic) with ulcer of unspecified lower extremity: Secondary | ICD-10-CM | POA: Diagnosis not present

## 2019-01-19 DIAGNOSIS — I110 Hypertensive heart disease with heart failure: Secondary | ICD-10-CM | POA: Diagnosis not present

## 2019-01-24 DIAGNOSIS — L03116 Cellulitis of left lower limb: Secondary | ICD-10-CM | POA: Diagnosis not present

## 2019-01-24 DIAGNOSIS — I509 Heart failure, unspecified: Secondary | ICD-10-CM | POA: Diagnosis not present

## 2019-01-24 DIAGNOSIS — I4891 Unspecified atrial fibrillation: Secondary | ICD-10-CM | POA: Diagnosis not present

## 2019-01-24 DIAGNOSIS — I87319 Chronic venous hypertension (idiopathic) with ulcer of unspecified lower extremity: Secondary | ICD-10-CM | POA: Diagnosis not present

## 2019-01-24 DIAGNOSIS — I110 Hypertensive heart disease with heart failure: Secondary | ICD-10-CM | POA: Diagnosis not present

## 2019-01-24 DIAGNOSIS — L03115 Cellulitis of right lower limb: Secondary | ICD-10-CM | POA: Diagnosis not present

## 2019-01-31 DIAGNOSIS — L03116 Cellulitis of left lower limb: Secondary | ICD-10-CM | POA: Diagnosis not present

## 2019-01-31 DIAGNOSIS — L03115 Cellulitis of right lower limb: Secondary | ICD-10-CM | POA: Diagnosis not present

## 2019-01-31 DIAGNOSIS — I110 Hypertensive heart disease with heart failure: Secondary | ICD-10-CM | POA: Diagnosis not present

## 2019-01-31 DIAGNOSIS — I4891 Unspecified atrial fibrillation: Secondary | ICD-10-CM | POA: Diagnosis not present

## 2019-01-31 DIAGNOSIS — I509 Heart failure, unspecified: Secondary | ICD-10-CM | POA: Diagnosis not present

## 2019-01-31 DIAGNOSIS — I87319 Chronic venous hypertension (idiopathic) with ulcer of unspecified lower extremity: Secondary | ICD-10-CM | POA: Diagnosis not present

## 2019-02-05 DIAGNOSIS — Z7901 Long term (current) use of anticoagulants: Secondary | ICD-10-CM | POA: Diagnosis not present

## 2019-02-05 DIAGNOSIS — I4891 Unspecified atrial fibrillation: Secondary | ICD-10-CM | POA: Diagnosis not present

## 2019-02-05 DIAGNOSIS — I110 Hypertensive heart disease with heart failure: Secondary | ICD-10-CM | POA: Diagnosis not present

## 2019-02-05 DIAGNOSIS — H3530 Unspecified macular degeneration: Secondary | ICD-10-CM | POA: Diagnosis not present

## 2019-02-05 DIAGNOSIS — I87319 Chronic venous hypertension (idiopathic) with ulcer of unspecified lower extremity: Secondary | ICD-10-CM | POA: Diagnosis not present

## 2019-02-05 DIAGNOSIS — E785 Hyperlipidemia, unspecified: Secondary | ICD-10-CM | POA: Diagnosis not present

## 2019-02-05 DIAGNOSIS — L03116 Cellulitis of left lower limb: Secondary | ICD-10-CM | POA: Diagnosis not present

## 2019-02-05 DIAGNOSIS — R32 Unspecified urinary incontinence: Secondary | ICD-10-CM | POA: Diagnosis not present

## 2019-02-05 DIAGNOSIS — L03115 Cellulitis of right lower limb: Secondary | ICD-10-CM | POA: Diagnosis not present

## 2019-02-05 DIAGNOSIS — I509 Heart failure, unspecified: Secondary | ICD-10-CM | POA: Diagnosis not present

## 2019-02-05 DIAGNOSIS — H903 Sensorineural hearing loss, bilateral: Secondary | ICD-10-CM | POA: Diagnosis not present

## 2019-02-07 DIAGNOSIS — I110 Hypertensive heart disease with heart failure: Secondary | ICD-10-CM | POA: Diagnosis not present

## 2019-02-07 DIAGNOSIS — I4891 Unspecified atrial fibrillation: Secondary | ICD-10-CM | POA: Diagnosis not present

## 2019-02-07 DIAGNOSIS — L03115 Cellulitis of right lower limb: Secondary | ICD-10-CM | POA: Diagnosis not present

## 2019-02-07 DIAGNOSIS — I87319 Chronic venous hypertension (idiopathic) with ulcer of unspecified lower extremity: Secondary | ICD-10-CM | POA: Diagnosis not present

## 2019-02-07 DIAGNOSIS — I509 Heart failure, unspecified: Secondary | ICD-10-CM | POA: Diagnosis not present

## 2019-02-07 DIAGNOSIS — L03116 Cellulitis of left lower limb: Secondary | ICD-10-CM | POA: Diagnosis not present

## 2019-02-10 DIAGNOSIS — L03116 Cellulitis of left lower limb: Secondary | ICD-10-CM | POA: Diagnosis not present

## 2019-02-10 DIAGNOSIS — L03115 Cellulitis of right lower limb: Secondary | ICD-10-CM | POA: Diagnosis not present

## 2019-02-10 DIAGNOSIS — I509 Heart failure, unspecified: Secondary | ICD-10-CM | POA: Diagnosis not present

## 2019-02-10 DIAGNOSIS — I110 Hypertensive heart disease with heart failure: Secondary | ICD-10-CM | POA: Diagnosis not present

## 2019-02-10 DIAGNOSIS — I4891 Unspecified atrial fibrillation: Secondary | ICD-10-CM | POA: Diagnosis not present

## 2019-02-10 DIAGNOSIS — I87319 Chronic venous hypertension (idiopathic) with ulcer of unspecified lower extremity: Secondary | ICD-10-CM | POA: Diagnosis not present

## 2019-02-14 DIAGNOSIS — L03116 Cellulitis of left lower limb: Secondary | ICD-10-CM | POA: Diagnosis not present

## 2019-02-14 DIAGNOSIS — I509 Heart failure, unspecified: Secondary | ICD-10-CM | POA: Diagnosis not present

## 2019-02-14 DIAGNOSIS — L03115 Cellulitis of right lower limb: Secondary | ICD-10-CM | POA: Diagnosis not present

## 2019-02-14 DIAGNOSIS — I110 Hypertensive heart disease with heart failure: Secondary | ICD-10-CM | POA: Diagnosis not present

## 2019-02-14 DIAGNOSIS — I4891 Unspecified atrial fibrillation: Secondary | ICD-10-CM | POA: Diagnosis not present

## 2019-02-14 DIAGNOSIS — I87319 Chronic venous hypertension (idiopathic) with ulcer of unspecified lower extremity: Secondary | ICD-10-CM | POA: Diagnosis not present

## 2019-02-15 DIAGNOSIS — I509 Heart failure, unspecified: Secondary | ICD-10-CM | POA: Diagnosis not present

## 2019-02-15 DIAGNOSIS — L03116 Cellulitis of left lower limb: Secondary | ICD-10-CM | POA: Diagnosis not present

## 2019-02-15 DIAGNOSIS — I110 Hypertensive heart disease with heart failure: Secondary | ICD-10-CM | POA: Diagnosis not present

## 2019-02-15 DIAGNOSIS — I4891 Unspecified atrial fibrillation: Secondary | ICD-10-CM | POA: Diagnosis not present

## 2019-02-15 DIAGNOSIS — I87319 Chronic venous hypertension (idiopathic) with ulcer of unspecified lower extremity: Secondary | ICD-10-CM | POA: Diagnosis not present

## 2019-02-15 DIAGNOSIS — L03115 Cellulitis of right lower limb: Secondary | ICD-10-CM | POA: Diagnosis not present

## 2019-02-17 DIAGNOSIS — I509 Heart failure, unspecified: Secondary | ICD-10-CM | POA: Diagnosis not present

## 2019-02-17 DIAGNOSIS — I87319 Chronic venous hypertension (idiopathic) with ulcer of unspecified lower extremity: Secondary | ICD-10-CM | POA: Diagnosis not present

## 2019-02-17 DIAGNOSIS — I110 Hypertensive heart disease with heart failure: Secondary | ICD-10-CM | POA: Diagnosis not present

## 2019-02-17 DIAGNOSIS — L03116 Cellulitis of left lower limb: Secondary | ICD-10-CM | POA: Diagnosis not present

## 2019-02-17 DIAGNOSIS — L03115 Cellulitis of right lower limb: Secondary | ICD-10-CM | POA: Diagnosis not present

## 2019-02-17 DIAGNOSIS — I4891 Unspecified atrial fibrillation: Secondary | ICD-10-CM | POA: Diagnosis not present

## 2019-02-21 DIAGNOSIS — I509 Heart failure, unspecified: Secondary | ICD-10-CM | POA: Diagnosis not present

## 2019-02-21 DIAGNOSIS — I87319 Chronic venous hypertension (idiopathic) with ulcer of unspecified lower extremity: Secondary | ICD-10-CM | POA: Diagnosis not present

## 2019-02-21 DIAGNOSIS — L03115 Cellulitis of right lower limb: Secondary | ICD-10-CM | POA: Diagnosis not present

## 2019-02-21 DIAGNOSIS — I110 Hypertensive heart disease with heart failure: Secondary | ICD-10-CM | POA: Diagnosis not present

## 2019-02-21 DIAGNOSIS — I4891 Unspecified atrial fibrillation: Secondary | ICD-10-CM | POA: Diagnosis not present

## 2019-02-21 DIAGNOSIS — L03116 Cellulitis of left lower limb: Secondary | ICD-10-CM | POA: Diagnosis not present

## 2019-02-24 DIAGNOSIS — I87319 Chronic venous hypertension (idiopathic) with ulcer of unspecified lower extremity: Secondary | ICD-10-CM | POA: Diagnosis not present

## 2019-02-24 DIAGNOSIS — I4891 Unspecified atrial fibrillation: Secondary | ICD-10-CM | POA: Diagnosis not present

## 2019-02-24 DIAGNOSIS — I509 Heart failure, unspecified: Secondary | ICD-10-CM | POA: Diagnosis not present

## 2019-02-24 DIAGNOSIS — L03116 Cellulitis of left lower limb: Secondary | ICD-10-CM | POA: Diagnosis not present

## 2019-02-24 DIAGNOSIS — L03115 Cellulitis of right lower limb: Secondary | ICD-10-CM | POA: Diagnosis not present

## 2019-02-24 DIAGNOSIS — I110 Hypertensive heart disease with heart failure: Secondary | ICD-10-CM | POA: Diagnosis not present

## 2019-02-28 DIAGNOSIS — I4891 Unspecified atrial fibrillation: Secondary | ICD-10-CM | POA: Diagnosis not present

## 2019-02-28 DIAGNOSIS — L03116 Cellulitis of left lower limb: Secondary | ICD-10-CM | POA: Diagnosis not present

## 2019-02-28 DIAGNOSIS — L03115 Cellulitis of right lower limb: Secondary | ICD-10-CM | POA: Diagnosis not present

## 2019-02-28 DIAGNOSIS — I87319 Chronic venous hypertension (idiopathic) with ulcer of unspecified lower extremity: Secondary | ICD-10-CM | POA: Diagnosis not present

## 2019-02-28 DIAGNOSIS — I509 Heart failure, unspecified: Secondary | ICD-10-CM | POA: Diagnosis not present

## 2019-02-28 DIAGNOSIS — I110 Hypertensive heart disease with heart failure: Secondary | ICD-10-CM | POA: Diagnosis not present

## 2019-03-03 DIAGNOSIS — L03115 Cellulitis of right lower limb: Secondary | ICD-10-CM | POA: Diagnosis not present

## 2019-03-03 DIAGNOSIS — I110 Hypertensive heart disease with heart failure: Secondary | ICD-10-CM | POA: Diagnosis not present

## 2019-03-03 DIAGNOSIS — L03116 Cellulitis of left lower limb: Secondary | ICD-10-CM | POA: Diagnosis not present

## 2019-03-03 DIAGNOSIS — I509 Heart failure, unspecified: Secondary | ICD-10-CM | POA: Diagnosis not present

## 2019-03-03 DIAGNOSIS — I4891 Unspecified atrial fibrillation: Secondary | ICD-10-CM | POA: Diagnosis not present

## 2019-03-03 DIAGNOSIS — I87319 Chronic venous hypertension (idiopathic) with ulcer of unspecified lower extremity: Secondary | ICD-10-CM | POA: Diagnosis not present

## 2019-03-07 DIAGNOSIS — Z7901 Long term (current) use of anticoagulants: Secondary | ICD-10-CM | POA: Diagnosis not present

## 2019-03-07 DIAGNOSIS — I110 Hypertensive heart disease with heart failure: Secondary | ICD-10-CM | POA: Diagnosis not present

## 2019-03-07 DIAGNOSIS — E785 Hyperlipidemia, unspecified: Secondary | ICD-10-CM | POA: Diagnosis not present

## 2019-03-07 DIAGNOSIS — H3530 Unspecified macular degeneration: Secondary | ICD-10-CM | POA: Diagnosis not present

## 2019-03-07 DIAGNOSIS — H903 Sensorineural hearing loss, bilateral: Secondary | ICD-10-CM | POA: Diagnosis not present

## 2019-03-07 DIAGNOSIS — I4891 Unspecified atrial fibrillation: Secondary | ICD-10-CM | POA: Diagnosis not present

## 2019-03-07 DIAGNOSIS — R32 Unspecified urinary incontinence: Secondary | ICD-10-CM | POA: Diagnosis not present

## 2019-03-07 DIAGNOSIS — I878 Other specified disorders of veins: Secondary | ICD-10-CM | POA: Diagnosis not present

## 2019-03-07 DIAGNOSIS — I509 Heart failure, unspecified: Secondary | ICD-10-CM | POA: Diagnosis not present

## 2019-03-10 DIAGNOSIS — I110 Hypertensive heart disease with heart failure: Secondary | ICD-10-CM | POA: Diagnosis not present

## 2019-03-10 DIAGNOSIS — H3530 Unspecified macular degeneration: Secondary | ICD-10-CM | POA: Diagnosis not present

## 2019-03-10 DIAGNOSIS — I509 Heart failure, unspecified: Secondary | ICD-10-CM | POA: Diagnosis not present

## 2019-03-10 DIAGNOSIS — I878 Other specified disorders of veins: Secondary | ICD-10-CM | POA: Diagnosis not present

## 2019-03-10 DIAGNOSIS — I4891 Unspecified atrial fibrillation: Secondary | ICD-10-CM | POA: Diagnosis not present

## 2019-03-10 DIAGNOSIS — E785 Hyperlipidemia, unspecified: Secondary | ICD-10-CM | POA: Diagnosis not present

## 2019-03-14 DIAGNOSIS — I110 Hypertensive heart disease with heart failure: Secondary | ICD-10-CM | POA: Diagnosis not present

## 2019-03-14 DIAGNOSIS — I878 Other specified disorders of veins: Secondary | ICD-10-CM | POA: Diagnosis not present

## 2019-03-14 DIAGNOSIS — E785 Hyperlipidemia, unspecified: Secondary | ICD-10-CM | POA: Diagnosis not present

## 2019-03-14 DIAGNOSIS — I4891 Unspecified atrial fibrillation: Secondary | ICD-10-CM | POA: Diagnosis not present

## 2019-03-14 DIAGNOSIS — H3530 Unspecified macular degeneration: Secondary | ICD-10-CM | POA: Diagnosis not present

## 2019-03-14 DIAGNOSIS — I509 Heart failure, unspecified: Secondary | ICD-10-CM | POA: Diagnosis not present

## 2019-03-17 DIAGNOSIS — I110 Hypertensive heart disease with heart failure: Secondary | ICD-10-CM | POA: Diagnosis not present

## 2019-03-17 DIAGNOSIS — I878 Other specified disorders of veins: Secondary | ICD-10-CM | POA: Diagnosis not present

## 2019-03-17 DIAGNOSIS — I509 Heart failure, unspecified: Secondary | ICD-10-CM | POA: Diagnosis not present

## 2019-03-17 DIAGNOSIS — E785 Hyperlipidemia, unspecified: Secondary | ICD-10-CM | POA: Diagnosis not present

## 2019-03-17 DIAGNOSIS — H3530 Unspecified macular degeneration: Secondary | ICD-10-CM | POA: Diagnosis not present

## 2019-03-17 DIAGNOSIS — I4891 Unspecified atrial fibrillation: Secondary | ICD-10-CM | POA: Diagnosis not present

## 2019-03-21 DIAGNOSIS — E785 Hyperlipidemia, unspecified: Secondary | ICD-10-CM | POA: Diagnosis not present

## 2019-03-21 DIAGNOSIS — I509 Heart failure, unspecified: Secondary | ICD-10-CM | POA: Diagnosis not present

## 2019-03-21 DIAGNOSIS — H3530 Unspecified macular degeneration: Secondary | ICD-10-CM | POA: Diagnosis not present

## 2019-03-21 DIAGNOSIS — I4891 Unspecified atrial fibrillation: Secondary | ICD-10-CM | POA: Diagnosis not present

## 2019-03-21 DIAGNOSIS — I878 Other specified disorders of veins: Secondary | ICD-10-CM | POA: Diagnosis not present

## 2019-03-21 DIAGNOSIS — I110 Hypertensive heart disease with heart failure: Secondary | ICD-10-CM | POA: Diagnosis not present

## 2019-03-24 DIAGNOSIS — I509 Heart failure, unspecified: Secondary | ICD-10-CM | POA: Diagnosis not present

## 2019-03-24 DIAGNOSIS — H3530 Unspecified macular degeneration: Secondary | ICD-10-CM | POA: Diagnosis not present

## 2019-03-24 DIAGNOSIS — E785 Hyperlipidemia, unspecified: Secondary | ICD-10-CM | POA: Diagnosis not present

## 2019-03-24 DIAGNOSIS — I110 Hypertensive heart disease with heart failure: Secondary | ICD-10-CM | POA: Diagnosis not present

## 2019-03-24 DIAGNOSIS — I4891 Unspecified atrial fibrillation: Secondary | ICD-10-CM | POA: Diagnosis not present

## 2019-03-24 DIAGNOSIS — I878 Other specified disorders of veins: Secondary | ICD-10-CM | POA: Diagnosis not present

## 2019-04-10 DIAGNOSIS — I480 Paroxysmal atrial fibrillation: Secondary | ICD-10-CM | POA: Diagnosis not present

## 2019-04-10 DIAGNOSIS — N184 Chronic kidney disease, stage 4 (severe): Secondary | ICD-10-CM | POA: Diagnosis not present

## 2019-04-10 DIAGNOSIS — I129 Hypertensive chronic kidney disease with stage 1 through stage 4 chronic kidney disease, or unspecified chronic kidney disease: Secondary | ICD-10-CM | POA: Diagnosis not present

## 2019-04-10 DIAGNOSIS — L723 Sebaceous cyst: Secondary | ICD-10-CM | POA: Diagnosis not present

## 2019-04-10 DIAGNOSIS — I87313 Chronic venous hypertension (idiopathic) with ulcer of bilateral lower extremity: Secondary | ICD-10-CM | POA: Diagnosis not present

## 2019-04-13 DIAGNOSIS — H353221 Exudative age-related macular degeneration, left eye, with active choroidal neovascularization: Secondary | ICD-10-CM | POA: Diagnosis not present

## 2019-04-13 DIAGNOSIS — H353213 Exudative age-related macular degeneration, right eye, with inactive scar: Secondary | ICD-10-CM | POA: Diagnosis not present

## 2019-06-05 DIAGNOSIS — L723 Sebaceous cyst: Secondary | ICD-10-CM | POA: Diagnosis not present

## 2019-06-14 ENCOUNTER — Ambulatory Visit: Payer: Medicare Other | Admitting: Podiatry

## 2019-06-28 DIAGNOSIS — L03116 Cellulitis of left lower limb: Secondary | ICD-10-CM | POA: Diagnosis not present

## 2019-06-28 DIAGNOSIS — I87313 Chronic venous hypertension (idiopathic) with ulcer of bilateral lower extremity: Secondary | ICD-10-CM | POA: Diagnosis not present

## 2019-06-28 DIAGNOSIS — L03115 Cellulitis of right lower limb: Secondary | ICD-10-CM | POA: Diagnosis not present

## 2019-07-13 DIAGNOSIS — H353213 Exudative age-related macular degeneration, right eye, with inactive scar: Secondary | ICD-10-CM | POA: Diagnosis not present

## 2019-07-13 DIAGNOSIS — H3554 Dystrophies primarily involving the retinal pigment epithelium: Secondary | ICD-10-CM | POA: Diagnosis not present

## 2019-07-13 DIAGNOSIS — H35362 Drusen (degenerative) of macula, left eye: Secondary | ICD-10-CM | POA: Diagnosis not present

## 2019-07-13 DIAGNOSIS — H353221 Exudative age-related macular degeneration, left eye, with active choroidal neovascularization: Secondary | ICD-10-CM | POA: Diagnosis not present

## 2019-07-21 DIAGNOSIS — L723 Sebaceous cyst: Secondary | ICD-10-CM | POA: Diagnosis not present

## 2019-08-11 DIAGNOSIS — M1711 Unilateral primary osteoarthritis, right knee: Secondary | ICD-10-CM | POA: Diagnosis not present

## 2019-08-23 ENCOUNTER — Other Ambulatory Visit: Payer: Self-pay | Admitting: *Deleted

## 2019-08-23 NOTE — Patient Outreach (Signed)
[facility]) Care Management  08/23/2019  KARRIE FLUELLEN 23-Mar-1919 [id number]   Referral received 08/23/2019 for requested DME Telephone  Assessment    RN spoke with pt today and introduced the [facility] program and services. RN inquired further non the pt's request. Pt states all she need is a new recliner. RN explained the process and offered to contact his provider with this request. RN further engaged and inquired on her medical issues or any other issues that she may need assistance with at this time. Pt denied and again states "I just need a recliner".    PLAN: RN contacted Dr. Shon Baton office directly and reached a representative. RN provided the details on pt's request for a recliner and also request his office reach out the the pt for details. RN has updated the pt on this contact and provided the pt [facility] number and her providers contact number. RN strongly encourage pt to follow up within the next week if the provider's office does not call or update her on the progress. Pt receptive and very grateful for this assistance.  No further needs at this time case will be in-active with no additional needs.  Raina Mina, RN Care Management Coordinator [place] Office [phone]

## 2019-09-18 ENCOUNTER — Other Ambulatory Visit: Payer: Self-pay

## 2019-09-18 ENCOUNTER — Encounter (HOSPITAL_COMMUNITY): Payer: Self-pay | Admitting: *Deleted

## 2019-09-18 ENCOUNTER — Inpatient Hospital Stay (HOSPITAL_COMMUNITY)
Admission: EM | Admit: 2019-09-18 | Discharge: 2019-09-25 | DRG: 603 | Disposition: A | Discharge status: Home Health | Payer: Medicare Other | Attending: Internal Medicine | Admitting: Internal Medicine

## 2019-09-18 DIAGNOSIS — Z91040 Latex allergy status: Secondary | ICD-10-CM

## 2019-09-18 DIAGNOSIS — Z03818 Encounter for observation for suspected exposure to other biological agents ruled out: Secondary | ICD-10-CM | POA: Diagnosis not present

## 2019-09-18 DIAGNOSIS — I130 Hypertensive heart and chronic kidney disease with heart failure and stage 1 through stage 4 chronic kidney disease, or unspecified chronic kidney disease: Secondary | ICD-10-CM | POA: Diagnosis present

## 2019-09-18 DIAGNOSIS — R0602 Shortness of breath: Secondary | ICD-10-CM

## 2019-09-18 DIAGNOSIS — L03119 Cellulitis of unspecified part of limb: Secondary | ICD-10-CM

## 2019-09-18 DIAGNOSIS — E860 Dehydration: Secondary | ICD-10-CM | POA: Diagnosis not present

## 2019-09-18 DIAGNOSIS — I4891 Unspecified atrial fibrillation: Secondary | ICD-10-CM | POA: Diagnosis present

## 2019-09-18 DIAGNOSIS — J45909 Unspecified asthma, uncomplicated: Secondary | ICD-10-CM | POA: Diagnosis present

## 2019-09-18 DIAGNOSIS — L03116 Cellulitis of left lower limb: Secondary | ICD-10-CM | POA: Diagnosis not present

## 2019-09-18 DIAGNOSIS — I872 Venous insufficiency (chronic) (peripheral): Secondary | ICD-10-CM | POA: Diagnosis present

## 2019-09-18 DIAGNOSIS — I272 Pulmonary hypertension, unspecified: Secondary | ICD-10-CM | POA: Diagnosis present

## 2019-09-18 DIAGNOSIS — D631 Anemia in chronic kidney disease: Secondary | ICD-10-CM | POA: Diagnosis present

## 2019-09-18 DIAGNOSIS — Z87891 Personal history of nicotine dependence: Secondary | ICD-10-CM

## 2019-09-18 DIAGNOSIS — L03115 Cellulitis of right lower limb: Principal | ICD-10-CM | POA: Diagnosis present

## 2019-09-18 DIAGNOSIS — L0390 Cellulitis, unspecified: Secondary | ICD-10-CM | POA: Diagnosis present

## 2019-09-18 DIAGNOSIS — I5032 Chronic diastolic (congestive) heart failure: Secondary | ICD-10-CM | POA: Diagnosis present

## 2019-09-18 DIAGNOSIS — N184 Chronic kidney disease, stage 4 (severe): Secondary | ICD-10-CM | POA: Diagnosis present

## 2019-09-18 DIAGNOSIS — Z66 Do not resuscitate: Secondary | ICD-10-CM | POA: Diagnosis not present

## 2019-09-18 DIAGNOSIS — E785 Hyperlipidemia, unspecified: Secondary | ICD-10-CM | POA: Diagnosis present

## 2019-09-18 DIAGNOSIS — Z20822 Contact with and (suspected) exposure to covid-19: Secondary | ICD-10-CM | POA: Diagnosis present

## 2019-09-18 DIAGNOSIS — I10 Essential (primary) hypertension: Secondary | ICD-10-CM | POA: Diagnosis present

## 2019-09-18 DIAGNOSIS — K219 Gastro-esophageal reflux disease without esophagitis: Secondary | ICD-10-CM | POA: Diagnosis present

## 2019-09-18 DIAGNOSIS — Z91048 Other nonmedicinal substance allergy status: Secondary | ICD-10-CM

## 2019-09-18 DIAGNOSIS — R0902 Hypoxemia: Secondary | ICD-10-CM | POA: Diagnosis not present

## 2019-09-18 DIAGNOSIS — I480 Paroxysmal atrial fibrillation: Secondary | ICD-10-CM | POA: Diagnosis present

## 2019-09-18 DIAGNOSIS — I890 Lymphedema, not elsewhere classified: Secondary | ICD-10-CM | POA: Diagnosis present

## 2019-09-18 DIAGNOSIS — R609 Edema, unspecified: Secondary | ICD-10-CM | POA: Diagnosis not present

## 2019-09-18 DIAGNOSIS — Z7901 Long term (current) use of anticoagulants: Secondary | ICD-10-CM

## 2019-09-18 DIAGNOSIS — N179 Acute kidney failure, unspecified: Secondary | ICD-10-CM | POA: Diagnosis present

## 2019-09-18 DIAGNOSIS — Z79899 Other long term (current) drug therapy: Secondary | ICD-10-CM

## 2019-09-18 DIAGNOSIS — R5381 Other malaise: Secondary | ICD-10-CM | POA: Diagnosis not present

## 2019-09-18 LAB — COMPREHENSIVE METABOLIC PANEL
ALT: 12 U/L (ref 0–44)
AST: 20 U/L (ref 15–41)
Albumin: 3.5 g/dL (ref 3.5–5.0)
Alkaline Phosphatase: 77 U/L (ref 38–126)
Anion gap: 10 (ref 5–15)
BUN: 55 mg/dL — ABNORMAL HIGH (ref 8–23)
CO2: 26 mmol/L (ref 22–32)
Calcium: 8.9 mg/dL (ref 8.9–10.3)
Chloride: 104 mmol/L (ref 98–111)
Creatinine, Ser: 2.32 mg/dL — ABNORMAL HIGH (ref 0.44–1.00)
GFR calc Af Amer: 20 mL/min — ABNORMAL LOW (ref >60)
GFR calc non Af Amer: 17 mL/min — ABNORMAL LOW (ref >60)
Glucose, Bld: 112 mg/dL — ABNORMAL HIGH (ref 70–99)
Potassium: 4.9 mmol/L (ref 3.5–5.1)
Sodium: 140 mmol/L (ref 135–145)
Total Bilirubin: 0.9 mg/dL (ref 0.3–1.2)
Total Protein: 6.8 g/dL (ref 6.5–8.1)

## 2019-09-18 LAB — CBC WITH DIFFERENTIAL/PLATELET
Abs Immature Granulocytes: 0.03 10*3/uL (ref 0.00–0.07)
Basophils Absolute: 0.1 10*3/uL (ref 0.0–0.1)
Basophils Relative: 2 %
Eosinophils Absolute: 0.4 10*3/uL (ref 0.0–0.5)
Eosinophils Relative: 5 %
HCT: 33.2 % — ABNORMAL LOW (ref 36.0–46.0)
Hemoglobin: 9.6 g/dL — ABNORMAL LOW (ref 12.0–15.0)
Immature Granulocytes: 0 %
Lymphocytes Relative: 13 %
Lymphs Abs: 1.1 10*3/uL (ref 0.7–4.0)
MCH: 19.8 pg — ABNORMAL LOW (ref 26.0–34.0)
MCHC: 28.9 g/dL — ABNORMAL LOW (ref 30.0–36.0)
MCV: 68.5 fL — ABNORMAL LOW (ref 80.0–100.0)
Monocytes Absolute: 1 10*3/uL (ref 0.1–1.0)
Monocytes Relative: 12 %
Neutro Abs: 5.7 10*3/uL (ref 1.7–7.7)
Neutrophils Relative %: 68 %
Platelets: 220 10*3/uL (ref 150–400)
RBC: 4.85 MIL/uL (ref 3.87–5.11)
RDW: 17.4 % — ABNORMAL HIGH (ref 11.5–15.5)
WBC: 8.2 10*3/uL (ref 4.0–10.5)
nRBC: 0 % (ref 0.0–0.2)

## 2019-09-18 LAB — LACTIC ACID, PLASMA: Lactic Acid, Venous: 1.3 mmol/L (ref 0.5–1.9)

## 2019-09-18 MED ORDER — VANCOMYCIN HCL IN DEXTROSE 1-5 GM/200ML-% IV SOLN
1000.0000 mg | Freq: Once | INTRAVENOUS | Status: DC
  Filled 2019-09-18: qty 200

## 2019-09-18 MED ORDER — VANCOMYCIN HCL 1500 MG/300ML IV SOLN
1500.0000 mg | INTRAVENOUS | Status: AC
  Administered 2019-09-18: 1500 mg via INTRAVENOUS
  Filled 2019-09-18: qty 300

## 2019-09-18 NOTE — ED Provider Notes (Signed)
[facility] DEPT Provider Note   CSN: [id number] Arrival date & time: 09/18/19  2057     History Chief Complaint  Patient presents with  . Cellulitis    Bilateral legs    Lori [name] is a 86 y.o. female presenting for evaluation of redness and swelling of bilateral lower extremities.  Patient states for the past 3 weeks, she has had worsening redness of bilateral lower extremities.  She has been on multiple rounds of antibiotics with her PCP, including clindamycin and doxycycline without improvement.  With the past few days, she has had worsening swelling which has moved proximally.  She states she has had similar symptoms before, has been treated for cellulitis with IV antibiotics and symptoms have improved.  She denies fevers, chills, nausea, vomiting.  She denies pain, tingling, weakness.  She denies chest pain, shortness of breath, abdominal pain, urinary symptoms, abnormal bowel movements.  She has continued to take all her home medications as prescribed including her fluid pill and blood thinner.  Additional history obtained from chart review.  Patient with a history of chronic venous stasis, lymphedema, anemia, hypertension, hyperlipidemia, A. fib on anticoagulation, CKD, CHF.   HPI     Past Medical History:  Diagnosis Date  . Anemia   . Asthma   . Cellulitis   . Chronic acquired lymphedema   . Chronic venous insufficiency   . Dysrhythmia   . Hyperlipidemia   . Hypertension     Patient Active Problem List   Diagnosis Date Noted  . Cellulitis 02/26/2018  . Chronic diastolic CHF (congestive heart failure) ([place]) 02/26/2018  . Chronic venous insufficiency 08/17/2017  . CKD (chronic kidney disease), stage IV ([place]) 08/17/2017  . Moderate mitral regurgitation 11/04/2016  . Elevated brain natriuretic peptide (BNP) level 11/04/2016  . Pulmonary HTN ([place]) 11/04/2016  . Hyponatremia 07/13/2012    Class: Acute  . Essential hypertension  03/31/2012  . Atrial fibrillation ([place]) 07/29/2011  . Peripheral edema 07/29/2011  . Cough 06/26/2010  . ANEMIA, IRON DEFICIENCY 06/10/2010  . ANEMIA-UNSPECIFIED 06/10/2010  . ASTHMA UNSPECIFIED 06/10/2010  . PERSONAL HX COLONIC POLYPS 06/10/2010    Past Surgical History:  Procedure Laterality Date  . JOINT REPLACEMENT    . left total knee replacement    . right total hip replacement    . TONSILLECTOMY       OB History   No obstetric history on file.     Family History  Problem Relation Age of Onset  . Lung cancer Mother   . Other Father        unknown causes  . Breast cancer Neg Hx   . Diabetes Neg Hx     Social History   Tobacco Use  . Smoking status: Former Smoker    Quit date: 07/28/1959    Years since quitting: 60.1  . Smokeless tobacco: Never Used  Substance Use Topics  . Alcohol use: No  . Drug use: No    Home Medications Prior to Admission medications   Medication Sig Start Date End Date Taking? Authorizing Provider  dabigatran (PRADAXA) 75 MG CAPS capsule TAKE 1 CAPSULE EVERY 12 HOURS Patient taking differently: Take 75 mg by mouth 2 (two) times daily. TAKE 1 CAPSULE EVERY 12 HOURS 05/02/18  Yes Evans Lance, MD  furosemide (LASIX) 20 MG tablet Take 40mg  PO daily, take an extra 20mg  Pill if your weight is over 2 pounds in 24hrs or your notice extra fluid in your legs. 11/07/16  Yes Thurnell Lose, MD  loratadine (CLARITIN) 10 MG tablet Take 10 mg by mouth at bedtime.   Yes [provider]  metoprolol tartrate (LOPRESSOR) 25 MG tablet Take 1 tablet (25 mg total) by mouth 2 (two) times daily. 11/27/16  Yes Evans Lance, MD  omeprazole (PRILOSEC) 40 MG capsule Take 40 mg by mouth 2 (two) times daily.    Yes [provider]  Probiotic Product (ALIGN PO) Take 1 capsule by mouth in the morning.    Yes [provider]  spironolactone (ALDACTONE) 25 MG tablet Take 25 mg by mouth every Monday, Wednesday, and Friday.    Yes  [provider]    Allergies    Dust mite extract, Mold extract [trichophyton mentagrophyte], Latex, and Tape  Review of Systems   Review of Systems  Cardiovascular: Positive for leg swelling.  Skin: Positive for color change.  Hematological: Bruises/bleeds easily.  All other systems reviewed and are negative.   Physical Exam Updated Vital Signs BP (!) 104/55   Pulse 92   Temp (!) 97.5 F (36.4 C) (Oral)   Resp 16   Ht 5\' 4"  (1.626 m)   Wt 78.9 kg   SpO2 91%   BMI 29.87 kg/m   Physical Exam Vitals and nursing note reviewed.  Constitutional:      General: She is not in acute distress.    Appearance: She is well-developed.     Comments: Resting comfortably in the bed in no acute distress  HENT:     Head: Normocephalic and atraumatic.  Eyes:     Conjunctiva/sclera: Conjunctivae normal.     Pupils: Pupils are equal, round, and reactive to light.  Cardiovascular:     Rate and Rhythm: Normal rate and regular rhythm.     Pulses: Normal pulses.  Pulmonary:     Effort: Pulmonary effort is normal. No respiratory distress.     Breath sounds: Normal breath sounds. No wheezing.  Abdominal:     General: There is no distension.     Palpations: Abdomen is soft.     Tenderness: There is no abdominal tenderness.  Musculoskeletal:        General: Normal range of motion.     Cervical back: Normal range of motion and neck supple.     Right lower leg: Edema present.     Left lower leg: Edema present.  Skin:    General: Skin is warm and dry.     Capillary Refill: Capillary refill takes less than 2 seconds.     Findings: Erythema present.     Comments: See picture below.  Erythematous, warm, and swollen bilateral lower extremities.  Pedal pulses 2+.  No ulcerations or drainage.  Neurological:     Mental Status: She is alert and oriented to person, place, and time.        ED Results / Procedures / Treatments   Labs (all labs ordered are listed, but only abnormal  results are displayed) Labs Reviewed  CBC WITH DIFFERENTIAL/PLATELET - Abnormal; Notable for the following components:      Result Value   Hemoglobin 9.6 (*)    HCT 33.2 (*)    MCV 68.5 (*)    MCH 19.8 (*)    MCHC 28.9 (*)    RDW 17.4 (*)    All other components within normal limits  COMPREHENSIVE METABOLIC PANEL - Abnormal; Notable for the following components:   Glucose, Bld 112 (*)    BUN 55 (*)  Creatinine, Ser 2.32 (*)    GFR calc non Af Amer 17 (*)    GFR calc Af Amer 20 (*)    All other components within normal limits  SARS CORONAVIRUS 2 (TAT 6-24 HRS)  LACTIC ACID, PLASMA    EKG None  Radiology No results found.  Procedures Procedures (including critical care time)  Medications Ordered in ED Medications  vancomycin (VANCOREADY) IVPB 1500 mg/300 mL (1,500 mg Intravenous New Bag/Given 09/18/19 2240)    ED Course  I have reviewed the triage vital signs and the nursing notes.  Pertinent labs & imaging results that were available during my care of the patient were reviewed by me and considered in my medical decision making (see chart for details).    MDM Rules/Calculators/A&P                      Patient presenting for evaluation of lower extremity redness and swelling.  Physical exam shows patient appears nontoxic.  She does have erythematous, warm, and swollen bilateral lower extremities.  Consider venous stasis dermatitis versus cellulitis.  Will obtain labs, start antibiotics, and reassess.  Case discussed with attending, Dr. Roderic Palau evaluated the patient.  Labs interpreted by me, overall reassuring.  She does have some mild elevation in creatinine and BUN, consistent with dehydration.  Due to her history of CHF, will have to be hydrated gently.  No leukocytosis.  On assessment, patient remained stable.  As patient has been on multiple rounds of antibiotics with worsening symptoms, will have patient remain in the hospital for IV antibiotics.  Discussed with Dr.  Juliet Rude from triad hospitalist service, pt to be admitted.   Final Clinical Impression(s) / ED Diagnoses Final diagnoses:  Cellulitis of lower extremity, unspecified laterality  Dehydration    Rx / DC Orders ED Discharge Orders    None       Franchot Heidelberg, PA-C 09/19/19 0000    Milton Ferguson, MD 09/19/19 1110

## 2019-09-18 NOTE — ED Triage Notes (Signed)
Pt BIB EMS and coming from home.  Pt presents with bilateral cellulitis on her legs.  Pt currently had edema in her legs. Pt denies any other symptoms to EMS.  Pt recently finished a course of antibiotics which usually helps with her symptoms but they have not helped this time. Pt a/o x 4 and ambulates with a walker at home.

## 2019-09-18 NOTE — Progress Notes (Signed)
A consult was received from an ED provider for Vancomycin per pharmacy dosing.  The patient's profile has been reviewed for ht/wt/allergies/indication/available labs.    A one time order has been placed for Vancomycin 1500mg  IV.  Further antibiotics/pharmacy consults should be ordered by admitting physician if indicated.                       Thank you, Luiz Ochoa 09/18/2019  10:16 PM

## 2019-09-19 ENCOUNTER — Inpatient Hospital Stay (HOSPITAL_COMMUNITY): Payer: Medicare Other

## 2019-09-19 DIAGNOSIS — L03119 Cellulitis of unspecified part of limb: Secondary | ICD-10-CM | POA: Diagnosis not present

## 2019-09-19 DIAGNOSIS — I5032 Chronic diastolic (congestive) heart failure: Secondary | ICD-10-CM | POA: Diagnosis present

## 2019-09-19 DIAGNOSIS — I872 Venous insufficiency (chronic) (peripheral): Secondary | ICD-10-CM

## 2019-09-19 DIAGNOSIS — Z91040 Latex allergy status: Secondary | ICD-10-CM | POA: Diagnosis not present

## 2019-09-19 DIAGNOSIS — I361 Nonrheumatic tricuspid (valve) insufficiency: Secondary | ICD-10-CM | POA: Diagnosis not present

## 2019-09-19 DIAGNOSIS — Z66 Do not resuscitate: Secondary | ICD-10-CM | POA: Diagnosis present

## 2019-09-19 DIAGNOSIS — R0602 Shortness of breath: Secondary | ICD-10-CM | POA: Diagnosis not present

## 2019-09-19 DIAGNOSIS — Z87891 Personal history of nicotine dependence: Secondary | ICD-10-CM | POA: Diagnosis not present

## 2019-09-19 DIAGNOSIS — L03116 Cellulitis of left lower limb: Secondary | ICD-10-CM | POA: Diagnosis present

## 2019-09-19 DIAGNOSIS — N179 Acute kidney failure, unspecified: Secondary | ICD-10-CM | POA: Diagnosis present

## 2019-09-19 DIAGNOSIS — I10 Essential (primary) hypertension: Secondary | ICD-10-CM | POA: Diagnosis not present

## 2019-09-19 DIAGNOSIS — I272 Pulmonary hypertension, unspecified: Secondary | ICD-10-CM | POA: Diagnosis present

## 2019-09-19 DIAGNOSIS — I480 Paroxysmal atrial fibrillation: Secondary | ICD-10-CM | POA: Diagnosis present

## 2019-09-19 DIAGNOSIS — K219 Gastro-esophageal reflux disease without esophagitis: Secondary | ICD-10-CM | POA: Diagnosis present

## 2019-09-19 DIAGNOSIS — R609 Edema, unspecified: Secondary | ICD-10-CM | POA: Diagnosis not present

## 2019-09-19 DIAGNOSIS — J45909 Unspecified asthma, uncomplicated: Secondary | ICD-10-CM | POA: Diagnosis present

## 2019-09-19 DIAGNOSIS — Z7901 Long term (current) use of anticoagulants: Secondary | ICD-10-CM | POA: Diagnosis not present

## 2019-09-19 DIAGNOSIS — E785 Hyperlipidemia, unspecified: Secondary | ICD-10-CM | POA: Diagnosis present

## 2019-09-19 DIAGNOSIS — I4819 Other persistent atrial fibrillation: Secondary | ICD-10-CM

## 2019-09-19 DIAGNOSIS — E860 Dehydration: Secondary | ICD-10-CM | POA: Diagnosis present

## 2019-09-19 DIAGNOSIS — N184 Chronic kidney disease, stage 4 (severe): Secondary | ICD-10-CM | POA: Diagnosis present

## 2019-09-19 DIAGNOSIS — Z91048 Other nonmedicinal substance allergy status: Secondary | ICD-10-CM | POA: Diagnosis not present

## 2019-09-19 DIAGNOSIS — Z20822 Contact with and (suspected) exposure to covid-19: Secondary | ICD-10-CM | POA: Diagnosis present

## 2019-09-19 DIAGNOSIS — D631 Anemia in chronic kidney disease: Secondary | ICD-10-CM | POA: Diagnosis present

## 2019-09-19 DIAGNOSIS — I890 Lymphedema, not elsewhere classified: Secondary | ICD-10-CM | POA: Diagnosis present

## 2019-09-19 DIAGNOSIS — L03115 Cellulitis of right lower limb: Secondary | ICD-10-CM | POA: Diagnosis present

## 2019-09-19 DIAGNOSIS — Z79899 Other long term (current) drug therapy: Secondary | ICD-10-CM | POA: Diagnosis not present

## 2019-09-19 DIAGNOSIS — I340 Nonrheumatic mitral (valve) insufficiency: Secondary | ICD-10-CM | POA: Diagnosis not present

## 2019-09-19 DIAGNOSIS — I130 Hypertensive heart and chronic kidney disease with heart failure and stage 1 through stage 4 chronic kidney disease, or unspecified chronic kidney disease: Secondary | ICD-10-CM | POA: Diagnosis present

## 2019-09-19 LAB — MRSA PCR SCREENING: MRSA by PCR: NEGATIVE

## 2019-09-19 LAB — BASIC METABOLIC PANEL
Anion gap: 9 (ref 5–15)
BUN: 55 mg/dL — ABNORMAL HIGH (ref 8–23)
CO2: 22 mmol/L (ref 22–32)
Calcium: 8.6 mg/dL — ABNORMAL LOW (ref 8.9–10.3)
Chloride: 107 mmol/L (ref 98–111)
Creatinine, Ser: 2.12 mg/dL — ABNORMAL HIGH (ref 0.44–1.00)
GFR calc Af Amer: 22 mL/min — ABNORMAL LOW (ref >60)
GFR calc non Af Amer: 19 mL/min — ABNORMAL LOW (ref >60)
Glucose, Bld: 92 mg/dL (ref 70–99)
Potassium: 4.5 mmol/L (ref 3.5–5.1)
Sodium: 138 mmol/L (ref 135–145)

## 2019-09-19 LAB — CBC
HCT: 32.9 % — ABNORMAL LOW (ref 36.0–46.0)
Hemoglobin: 9.7 g/dL — ABNORMAL LOW (ref 12.0–15.0)
MCH: 20.1 pg — ABNORMAL LOW (ref 26.0–34.0)
MCHC: 29.5 g/dL — ABNORMAL LOW (ref 30.0–36.0)
MCV: 68.1 fL — ABNORMAL LOW (ref 80.0–100.0)
Platelets: 205 10*3/uL (ref 150–400)
RBC: 4.83 MIL/uL (ref 3.87–5.11)
RDW: 17.4 % — ABNORMAL HIGH (ref 11.5–15.5)
WBC: 7.2 10*3/uL (ref 4.0–10.5)
nRBC: 0 % (ref 0.0–0.2)

## 2019-09-19 LAB — URINALYSIS, ROUTINE W REFLEX MICROSCOPIC
Bacteria, UA: NONE SEEN
Bilirubin Urine: NEGATIVE
Glucose, UA: NEGATIVE mg/dL
Hgb urine dipstick: NEGATIVE
Ketones, ur: NEGATIVE mg/dL
Nitrite: NEGATIVE
Protein, ur: NEGATIVE mg/dL
Specific Gravity, Urine: 1.019 (ref 1.005–1.030)
pH: 6 (ref 5.0–8.0)

## 2019-09-19 LAB — SARS CORONAVIRUS 2 BY RT PCR (DIASORIN): SARS Coronavirus 2: NEGATIVE

## 2019-09-19 MED ORDER — CEFAZOLIN SODIUM-DEXTROSE 1-4 GM/50ML-% IV SOLN
1.0000 g | Freq: Two times a day (BID) | INTRAVENOUS | Status: DC
  Administered 2019-09-20 – 2019-09-25 (×11): 1 g via INTRAVENOUS
  Filled 2019-09-19 (×11): qty 50

## 2019-09-19 MED ORDER — SODIUM CHLORIDE 0.9 % IV BOLUS
250.0000 mL | Freq: Once | INTRAVENOUS | Status: AC
  Administered 2019-09-19: 250 mL via INTRAVENOUS

## 2019-09-19 MED ORDER — LORATADINE 10 MG PO TABS
10.0000 mg | ORAL_TABLET | Freq: Every day | ORAL | Status: DC
  Administered 2019-09-19 – 2019-09-20 (×2): 10 mg via ORAL
  Filled 2019-09-19 (×2): qty 1

## 2019-09-19 MED ORDER — ONDANSETRON 4 MG PO TBDP: 4.0000 mg | ORAL_TABLET | Freq: Three times a day (TID) | ORAL | Status: DC | PRN

## 2019-09-19 MED ORDER — HEPARIN SODIUM (PORCINE) 5000 UNIT/ML IJ SOLN
5000.0000 [IU] | Freq: Three times a day (TID) | INTRAMUSCULAR | Status: DC
  Administered 2019-09-19 – 2019-09-20 (×3): 5000 [IU] via SUBCUTANEOUS
  Filled 2019-09-19 (×3): qty 1

## 2019-09-19 MED ORDER — METOPROLOL TARTRATE 25 MG PO TABS
25.0000 mg | ORAL_TABLET | Freq: Two times a day (BID) | ORAL | Status: DC
  Administered 2019-09-19 – 2019-09-25 (×12): 25 mg via ORAL
  Filled 2019-09-19 (×13): qty 1

## 2019-09-19 MED ORDER — SODIUM CHLORIDE 0.9 % IV SOLN
1.0000 g | INTRAVENOUS | Status: DC
  Administered 2019-09-19: 1 g via INTRAVENOUS
  Filled 2019-09-19: qty 1

## 2019-09-19 MED ORDER — RISAQUAD PO CAPS
1.0000 | ORAL_CAPSULE | Freq: Every morning | ORAL | Status: DC
  Administered 2019-09-19 – 2019-09-25 (×7): 1 via ORAL
  Filled 2019-09-19 (×7): qty 1

## 2019-09-19 MED ORDER — DABIGATRAN ETEXILATE MESYLATE 75 MG PO CAPS: 75.0000 mg | ORAL_CAPSULE | Freq: Two times a day (BID) | ORAL | Status: DC

## 2019-09-19 MED ORDER — ACETAMINOPHEN 325 MG PO TABS
650.0000 mg | ORAL_TABLET | Freq: Four times a day (QID) | ORAL | Status: DC | PRN
  Administered 2019-09-19: 650 mg via ORAL
  Filled 2019-09-19 (×2): qty 2

## 2019-09-19 MED ORDER — VANCOMYCIN VARIABLE DOSE PER UNSTABLE RENAL FUNCTION (PHARMACIST DOSING): Status: DC

## 2019-09-19 MED ORDER — ONDANSETRON HCL 4 MG/2ML IJ SOLN
4.0000 mg | Freq: Three times a day (TID) | INTRAMUSCULAR | Status: DC | PRN
  Administered 2019-09-19: 4 mg via INTRAVENOUS
  Filled 2019-09-19: qty 2

## 2019-09-19 MED ORDER — PANTOPRAZOLE SODIUM 40 MG PO TBEC
40.0000 mg | DELAYED_RELEASE_TABLET | Freq: Two times a day (BID) | ORAL | Status: DC
  Administered 2019-09-19 – 2019-09-25 (×13): 40 mg via ORAL
  Filled 2019-09-19 (×13): qty 1

## 2019-09-19 MED ORDER — SODIUM CHLORIDE 0.9 % IV SOLN
2.0000 g | INTRAVENOUS | Status: DC
  Administered 2019-09-19: 2 g via INTRAVENOUS
  Filled 2019-09-19: qty 2

## 2019-09-19 MED ORDER — ACETAMINOPHEN 650 MG RE SUPP: 650.0000 mg | Freq: Four times a day (QID) | RECTAL | Status: DC | PRN

## 2019-09-19 MED ORDER — POLYETHYLENE GLYCOL 3350 17 G PO PACK: 17.0000 g | PACK | Freq: Every day | ORAL | Status: DC | PRN

## 2019-09-19 NOTE — Progress Notes (Addendum)
Patient admitted early this morning for bilateral lower extremity cellulitis failed outpatient treatment She was started with vancomycin and Zosyn, she does not appear septic, no fever, no leukocytosis, will send MRSA screen, likely to be MRSA there is no purulent wound, suspect likely strep species related, will narrow antibiotic, Vanco and Zosyn, change to Ancef Patient will benefit from bilateral Unna boots, wound care consulted, continue to elevate legs. ( needs ABI prior to unna boots application per wound care request, abi ordered)  AKI on CKD 4,  UA unremarkable, received 250 cc fluids bolus on admission, Lasix held since admission -Creatinine improved, repeat BMP in the morning -Likely able to resume Lasix tomorrow -Home med Pradaxa held due to AKI, likely able to resume tomorrow

## 2019-09-19 NOTE — Progress Notes (Signed)
Pharmacy Brief Note - Antibiotics:  Patient admitted with non-purulent cellulitis, started on vancomycin + ceftriaxone on admission.   Given patients age and AKI, discussed ABX with MD, will narrow to cefazolin. Cefazolin 1 g IV q12h ordered.  Lenis Noon, PharmD 09/19/19 10:49 AM

## 2019-09-19 NOTE — Progress Notes (Signed)
ABI's have been completed. Preliminary results can be found in CV Proc through chart review.   09/19/19 1:50 PM Lori [name] RVT

## 2019-09-19 NOTE — Consult Note (Addendum)
WOC consult requested to apply Una boots for cellulitis.  Pt has not had an ABI performed yet before compression wraps can be applied; ortho tech performs this procedure.  Discussed plan of care with primary team via secure chat.  ABI will be ordered and if perfusion is adequate, then Una boots can be applied at that time.   14:00: ABI results are normal for right leg: .6   and below normal to left leg: .54; this is not adequate perfusion to apply Una boots.  Secure chat sent to primary team. Bilat legs with generalized edema and erythemia, no open wounds or weeping.  Areas which were previously marked for cellulitis are beginning to recede.  Pt states she was wearing compression stockings prior to admission for edema control, but then had to stop wearing them when her legs became too swollen recently. Applied kerlex and ace wraps to provide light compression.  Orders provided for bedside nurse to perform daily to assist with decreasing edema: Bedside nurse; assess bilat legs and re-wrap Q day as follows:  Apply kerlex in a spiral fashion, beginning just behind toes to below knees, then ace wrap in the same manner.  Pt can resume use of compression stockings after discharge.  Please re-consult if further assistance is needed.  Thank-you,  Julien Girt MSN, [place], [place], [place], [place]

## 2019-09-19 NOTE — ED Notes (Signed)
Dr. Claria Dice at bedside.  Pt to be transported after MD finishes assessment of pt.

## 2019-09-19 NOTE — H&P (Addendum)
PCP:   Leanna Battles, MD   CODE STATUS: Full code  Chief Complaint: Lower extremity redness  HPI: This is a 86 year old female who presents with complaints of lower extremity swelling and cellulitis.  She has seen her PCP and was prescribed antibiotics doxycycline, then clindamycin.  This started approximately 3 weeks ago.  Today she developed worsening swelling and redness.  She decided to come to the ER.  She does not night any nausea vomiting, fevers or chills.  She has chronic lower extremity lymphedema and states she gets cellulitis approximately once annually.  History provided by the patient who is alert and oriented   Review of Systems:  The patient denies anorexia, fever, weight loss,, vision loss, decreased hearing, hoarseness, chest pain, syncope, dyspnea on exertion, peripheral edema, balance deficits, hemoptysis, abdominal pain, melena, hematochezia, severe indigestion/heartburn, hematuria, incontinence, genital sores, muscle weakness, suspicious skin lesions, lower extremity redness, transient blindness, difficulty walking, depression, unusual weight change, abnormal bleeding, enlarged lymph nodes, angioedema, and breast masses.  Past Medical History: Past Medical History:  Diagnosis Date  . Anemia   . Asthma   . Cellulitis   . Chronic acquired lymphedema   . Chronic venous insufficiency   . Dysrhythmia   . Hyperlipidemia   . Hypertension    Past Surgical History:  Procedure Laterality Date  . JOINT REPLACEMENT    . left total knee replacement    . right total hip replacement    . TONSILLECTOMY      Medications: Prior to Admission medications   Medication Sig Start Date End Date Taking? Authorizing Provider  dabigatran (PRADAXA) 75 MG CAPS capsule TAKE 1 CAPSULE EVERY 12 HOURS Patient taking differently: Take 75 mg by mouth 2 (two) times daily. TAKE 1 CAPSULE EVERY 12 HOURS 05/02/18  Yes Evans Lance, MD  furosemide (LASIX) 20 MG tablet Take 40mg  PO daily,  take an extra 20mg  Pill if your weight is over 2 pounds in 24hrs or your notice extra fluid in your legs. 11/07/16  Yes Thurnell Lose, MD  loratadine (CLARITIN) 10 MG tablet Take 10 mg by mouth at bedtime.   Yes [provider]  metoprolol tartrate (LOPRESSOR) 25 MG tablet Take 1 tablet (25 mg total) by mouth 2 (two) times daily. 11/27/16  Yes Evans Lance, MD  omeprazole (PRILOSEC) 40 MG capsule Take 40 mg by mouth 2 (two) times daily.    Yes [provider]  Probiotic Product (ALIGN PO) Take 1 capsule by mouth in the morning.    Yes [provider]  spironolactone (ALDACTONE) 25 MG tablet Take 25 mg by mouth every Monday, Wednesday, and Friday.    Yes [provider]    Allergies:   Allergies  Allergen Reactions  . Dust Mite Extract Shortness Of Breath and Itching  . Mold Extract [Trichophyton Mentagrophyte] Shortness Of Breath, Itching and Cough    VERY ALLERGIC/EXPOSURE RESULTS IN TREMENDOUS COUGHING (just one symptom)  . Latex Itching    And certain fabrics  . Tape Other (See Comments)    SKIN IS VERY THIN AND WILL TEAR AND BRUISE EASILY!! PLEASE USE COBAN WRAP-    Social History:  reports that she quit smoking about 60 years ago. She has never used smokeless tobacco. She reports that she does not drink alcohol or use drugs.  Family History: Family History  Problem Relation Age of Onset  . Lung cancer Mother   . Other Father        unknown causes  .  Breast cancer Neg Hx   . Diabetes Neg Hx     Physical Exam: Vitals:   09/18/19 2109 09/18/19 2115 09/18/19 2121 09/18/19 2300  BP:   (!) 138/93 (!) 104/55  Pulse:   89 92  Resp:   15 16  Temp:   (!) 97.5 F (36.4 C)   TempSrc:   Oral   SpO2: 96%  96% 91%  Weight:  78.9 kg    Height:  5\' 4"  (1.626 m)      General:  Alert and oriented times three, well developed and nourished, no acute distress Eyes: PERRLA, pink conjunctiva, no scleral icterus ENT: Moist oral mucosa, neck  supple, no thyromegaly Lungs: clear to ascultation, no wheeze, no crackles, no use of accessory muscles Cardiovascular: regular rate and rhythm, no regurgitation, no gallops, no murmurs. No carotid bruits, no JVD Abdomen: soft, positive BS, non-tender, non-distended, no organomegaly, not an acute abdomen GU: not examined Neuro: CN II - XII grossly intact, sensation intact Musculoskeletal: strength 5/5 all extremities, no clubbing, cyanosis or edema, bilateral lower extremity erythema and warmth feet up to knees Skin: no rash, no subcutaneous crepitation, no decubitus Psych: appropriate patient   Labs on Admission:  Recent Labs    09/18/19 2224  NA 140  K 4.9  CL 104  CO2 26  GLUCOSE 112*  BUN 55*  CREATININE 2.32*  CALCIUM 8.9   Recent Labs    09/18/19 2224  AST 20  ALT 12  ALKPHOS 77  BILITOT 0.9  PROT 6.8  ALBUMIN 3.5   No results for input(s): LIPASE, AMYLASE in the last 72 hours. Recent Labs    09/18/19 2224  WBC 8.2  NEUTROABS 5.7  HGB 9.6*  HCT 33.2*  MCV 68.5*  PLT 220   No results for input(s): CKTOTAL, CKMB, CKMBINDEX, TROPONINI in the last 72 hours. Invalid input(s): POCBNP No results for input(s): DDIMER in the last 72 hours. No results for input(s): HGBA1C in the last 72 hours. No results for input(s): CHOL, HDL, LDLCALC, TRIG, CHOLHDL, LDLDIRECT in the last 72 hours. No results for input(s): TSH, T4TOTAL, T3FREE, THYROIDAB in the last 72 hours.  Invalid input(s): FREET3 No results for input(s): VITAMINB12, FOLATE, FERRITIN, TIBC, IRON, RETICCTPCT in the last 72 hours.  Micro Results: No results found for this or any previous visit (from the past 240 hour(s)).   Radiological Exams on Admission: No results found.  Assessment/Plan Present on Admission: . Bilateral lower extremity cellulitis -Admit to MedSurg -Blood cultures x2 -IV antibiotics vancomycin and Zosyn ordered   . Acute on CKD (chronic kidney disease), stage IV ([facility]) -A bit  worse than baseline.  250 cc normal saline ordered.  Will hold diuretics for now -BMP in a.m.  . Atrial fibrillation ([place]) . Chronic diastolic CHF (congestive heart failure) ([facility]) -Stable, home meds resumed except Pradaxa -Pradaxa held d/t patients creatinine clearance. Gentle fluid bolus ordered, diuretics held. Repeat BMP and resume Pradaxa once creatinine improved  . Chronic venous insufficiency -Stable, home meds resumed  . Essential hypertension -Stable, home meds resumed  Brittlyn Cloe 09/19/2019, 12:17 AM

## 2019-09-19 NOTE — Progress Notes (Addendum)
Pharmacy Antibiotic Note  Lori [name] is a 86 y.o. female admitted on 09/18/2019 with bilateral lower extremity cellulitis.  Pharmacy has been consulted for Vancomycin dosing. Patient also placed on Ceftriaxone per MD.   Plan: Vancomycin 1500mg  IV x 1 given in the ED. Given acute on CKD, stage IV, will PRN dose for now based on levels. Check random level 3/17 ~ 2200.  Ceftriaxone 2g IV q24h per MD. Monitor renal function, cultures, clinical course.   Height: 5\' 4"  (162.6 cm) Weight: 174 lb (78.9 kg) IBW/kg (Calculated) : 54.7  Temp (24hrs), Avg:97.8 F (36.6 C), Min:97.5 F (36.4 C), Max:98 F (36.7 C)  Recent Labs  Lab 09/18/19 2224  WBC 8.2  CREATININE 2.32*  LATICACIDVEN 1.3    Estimated Creatinine Clearance: 13.8 mL/min (A) (by C-G formula based on SCr of 2.32 mg/dL (H)).    Allergies  Allergen Reactions  . Dust Mite Extract Shortness Of Breath and Itching  . Mold Extract [Trichophyton Mentagrophyte] Shortness Of Breath, Itching and Cough    VERY ALLERGIC/EXPOSURE RESULTS IN TREMENDOUS COUGHING (just one symptom)  . Latex Itching    And certain fabrics  . Tape Other (See Comments)    SKIN IS VERY THIN AND WILL TEAR AND BRUISE EASILY!! PLEASE USE COBAN WRAP-    Antimicrobials this admission: 3/15 Vancomycin >> 3/16 Ceftriaxone >>   Dose adjustments this admission: --  Microbiology results: 3/15 COVID: sent  Thank you for allowing pharmacy to be a part of this patient's care.   Luiz Ochoa 09/19/2019 3:14 AM

## 2019-09-20 ENCOUNTER — Inpatient Hospital Stay (HOSPITAL_COMMUNITY): Payer: Medicare Other

## 2019-09-20 DIAGNOSIS — I272 Pulmonary hypertension, unspecified: Secondary | ICD-10-CM

## 2019-09-20 DIAGNOSIS — N184 Chronic kidney disease, stage 4 (severe): Secondary | ICD-10-CM

## 2019-09-20 LAB — CBC WITH DIFFERENTIAL/PLATELET
Abs Immature Granulocytes: 0.03 10*3/uL (ref 0.00–0.07)
Basophils Absolute: 0.1 10*3/uL (ref 0.0–0.1)
Basophils Relative: 2 %
Eosinophils Absolute: 0.1 10*3/uL (ref 0.0–0.5)
Eosinophils Relative: 2 %
HCT: 36.7 % (ref 36.0–46.0)
Hemoglobin: 10.5 g/dL — ABNORMAL LOW (ref 12.0–15.0)
Immature Granulocytes: 0 %
Lymphocytes Relative: 10 %
Lymphs Abs: 0.8 10*3/uL (ref 0.7–4.0)
MCH: 19.6 pg — ABNORMAL LOW (ref 26.0–34.0)
MCHC: 28.6 g/dL — ABNORMAL LOW (ref 30.0–36.0)
MCV: 68.6 fL — ABNORMAL LOW (ref 80.0–100.0)
Monocytes Absolute: 1 10*3/uL (ref 0.1–1.0)
Monocytes Relative: 12 %
Neutro Abs: 6 10*3/uL (ref 1.7–7.7)
Neutrophils Relative %: 74 %
Platelets: 208 10*3/uL (ref 150–400)
RBC: 5.35 MIL/uL — ABNORMAL HIGH (ref 3.87–5.11)
RDW: 18.5 % — ABNORMAL HIGH (ref 11.5–15.5)
WBC: 8.1 10*3/uL (ref 4.0–10.5)
nRBC: 0 % (ref 0.0–0.2)

## 2019-09-20 LAB — BASIC METABOLIC PANEL
Anion gap: 9 (ref 5–15)
BUN: 50 mg/dL — ABNORMAL HIGH (ref 8–23)
CO2: 22 mmol/L (ref 22–32)
Calcium: 9 mg/dL (ref 8.9–10.3)
Chloride: 109 mmol/L (ref 98–111)
Creatinine, Ser: 1.98 mg/dL — ABNORMAL HIGH (ref 0.44–1.00)
GFR calc Af Amer: 24 mL/min — ABNORMAL LOW (ref >60)
GFR calc non Af Amer: 21 mL/min — ABNORMAL LOW (ref >60)
Glucose, Bld: 117 mg/dL — ABNORMAL HIGH (ref 70–99)
Potassium: 4.8 mmol/L (ref 3.5–5.1)
Sodium: 140 mmol/L (ref 135–145)

## 2019-09-20 LAB — BRAIN NATRIURETIC PEPTIDE: B Natriuretic Peptide: 482.3 pg/mL — ABNORMAL HIGH (ref 0.0–100.0)

## 2019-09-20 MED ORDER — DABIGATRAN ETEXILATE MESYLATE 75 MG PO CAPS
75.0000 mg | ORAL_CAPSULE | Freq: Two times a day (BID) | ORAL | Status: DC
  Administered 2019-09-20 – 2019-09-25 (×11): 75 mg via ORAL
  Filled 2019-09-20 (×11): qty 1

## 2019-09-20 NOTE — Progress Notes (Signed)
PT Cancellation Note  Patient Details Name: Lori [name] MRN: [id number] DOB: Jun 16, 1919   Cancelled Treatment:    Reason Eval/Treat Not Completed: Fatigue/lethargy limiting ability to participate(pt stated she's fatigued from a busy day and wants to rest right now. Will follow.)   Philomena Doheny PT 09/20/2019  Acute Rehabilitation Services Pager [phone] Office [phone]

## 2019-09-20 NOTE — Progress Notes (Signed)
PROGRESS NOTE  REITA SHINDLER XBJ:[id number] DOB: [birth year] DOA: 09/18/2019 PCP: Leanna Battles, MD  HPI/Recap of past 24 hours: HPI from [name]  86 year old female who presents with complaints of lower extremity swelling and cellulitis.  She has seen her PCP and was prescribed antibiotics doxycycline, then clindamycin.  This started approximately 3 weeks ago. Pt developed worsening swelling and redness.  She decided to come to the ER. Pt has chronic lower extremity lymphedema and states she gets cellulitis approximately once annually. History provided by the patient who is alert and oriented.    Today, patient still with bilateral lower extremity redness, swelling, denies any fever/chills, nausea/vomiting, abdominal pain, chest pain, shortness of breath, cough.    Assessment/Plan: Active Problems:   Asthma   Atrial fibrillation (HCC)   Essential hypertension   Pulmonary HTN (HCC)   Chronic venous insufficiency   CKD (chronic kidney disease), stage IV (HCC)   Cellulitis   Chronic diastolic CHF (congestive heart failure) (HCC)   Bilateral lower extremity cellulitis History of chronic venous insufficiency Currently afebrile, with no leukocytosis Continue IV Ancef Continue daily wound care, with Kerlex, Ace wrap for light compression  AKI on CKD stage IV Creatinine around 1.4-1.7 Continue to hold home Lasix Daily BMP  Anemia of chronic kidney disease Hemoglobin around baseline Daily CBC  Paroxysmal A. Fib/hypertension Currently rate controlled Continue Lopressor, restart Pradaxa  Chronic diastolic HF BNP pending Chest x-ray pending Echo done in 2018 showed EF of 65 to 70%, PA peak pressure 48 mmHg, moderate sized loculated pericardial effusion Hold home Lasix for now due to AKI Monitor closely  GERD Continue PPI  Chronic venous insufficiency Continue Kerlex, Ace wrap for light compression        Malnutrition Type:      Malnutrition  Characteristics:      Nutrition Interventions:       Estimated body mass index is 29.87 kg/m as calculated from the following:   Height as of this encounter: 5\' 4"  (1.626 m).   Weight as of this encounter: 78.9 kg.     Code Status: DNR  Family Communication: Discussed with granddaughter on 09/20/2019, all questions answered.  Reports patient is a DNR  Disposition Plan: Patient lives alone, came from home.  Pending PT/OT eval   Consultants:  None  Procedures:  None  Antimicrobials:  IV Ancef  DVT prophylaxis: Pradaxa   Objective: Vitals:   09/19/19 1124 09/19/19 1400 09/19/19 2031 09/20/19 0537  BP: 133/71 120/62 135/82 114/63  Pulse: 85 61 99 71  Resp: 18 18 20 18   Temp: 97.6 F (36.4 C) 97.7 F (36.5 C) 97.6 F (36.4 C) 98.1 F (36.7 C)  TempSrc: Oral Oral    SpO2: 94% 93% (!) 87% (!) 88%  Weight:      Height:        Intake/Output Summary (Last 24 hours) at 09/20/2019 1101 Last data filed at 09/20/2019 0848 Gross per 24 hour  Intake 240 ml  Output 275 ml  Net -35 ml   Filed Weights   09/18/19 2115  Weight: 78.9 kg    Exam:  General: NAD, alert, oriented x3  Cardiovascular: S1, S2 present  Respiratory: CTAB  Abdomen: Soft, nontender, nondistended, bowel sounds present  Musculoskeletal: +1 bilateral pedal edema noted, erythema noted, currently wrapped in Ace wrap  Skin:  Noted bruises  Psychiatry: Normal mood    Data Reviewed: CBC: Recent Labs  Lab 09/18/19 2224 09/19/19 0643 09/20/19 0544  WBC 8.2 7.2 8.1  NEUTROABS 5.7  --  6.0  HGB 9.6* 9.7* 10.5*  HCT 33.2* 32.9* 36.7  MCV 68.5* 68.1* 68.6*  PLT 220 205 161   Basic Metabolic Panel: Recent Labs  Lab 09/18/19 2224 09/19/19 0643 09/20/19 0544  NA 140 138 140  K 4.9 4.5 4.8  CL 104 107 109  CO2 26 22 22   GLUCOSE 112* 92 117*  BUN 55* 55* 50*  CREATININE 2.32* 2.12* 1.98*  CALCIUM 8.9 8.6* 9.0   GFR: Estimated Creatinine Clearance: 16.1 mL/min (A) (by C-G  formula based on SCr of 1.98 mg/dL (H)). Liver Function Tests: Recent Labs  Lab 09/18/19 2224  AST 20  ALT 12  ALKPHOS 77  BILITOT 0.9  PROT 6.8  ALBUMIN 3.5   No results for input(s): LIPASE, AMYLASE in the last 168 hours. No results for input(s): AMMONIA in the last 168 hours. Coagulation Profile: No results for input(s): INR, PROTIME in the last 168 hours. Cardiac Enzymes: No results for input(s): CKTOTAL, CKMB, CKMBINDEX, TROPONINI in the last 168 hours. BNP (last 3 results) No results for input(s): PROBNP in the last 8760 hours. HbA1C: No results for input(s): HGBA1C in the last 72 hours. CBG: No results for input(s): GLUCAP in the last 168 hours. Lipid Profile: No results for input(s): CHOL, HDL, LDLCALC, TRIG, CHOLHDL, LDLDIRECT in the last 72 hours. Thyroid Function Tests: No results for input(s): TSH, T4TOTAL, FREET4, T3FREE, THYROIDAB in the last 72 hours. Anemia Panel: No results for input(s): VITAMINB12, FOLATE, FERRITIN, TIBC, IRON, RETICCTPCT in the last 72 hours. Urine analysis:    Component Value Date/Time   COLORURINE YELLOW 09/19/2019 0843   APPEARANCEUR CLEAR 09/19/2019 0843   LABSPEC 1.019 09/19/2019 0843   PHURINE 6.0 09/19/2019 0843   GLUCOSEU NEGATIVE 09/19/2019 0843   HGBUR NEGATIVE 09/19/2019 0843   BILIRUBINUR NEGATIVE 09/19/2019 0843   KETONESUR NEGATIVE 09/19/2019 0843   PROTEINUR NEGATIVE 09/19/2019 0843   UROBILINOGEN 0.2 07/10/2012 1122   NITRITE NEGATIVE 09/19/2019 0843   LEUKOCYTESUR TRACE (A) 09/19/2019 0843   Sepsis Labs: @LABRCNTIP (procalcitonin:4,lacticidven:4)  ) Recent Results (from the past 240 hour(s))  SARS Coronavirus 2 by RT PCR     Status: None   Collection Time: 09/18/19 11:19 PM  Result Value Ref Range Status   SARS Coronavirus 2 NEGATIVE NEGATIVE Final    Comment: (NOTE) Result indicates the ABSENCE of SARS-CoV-2 RNA in the patient specimen.  The lowest concentration of SARS-CoV-2 viral copies this assay  can detect in nasopharyngeal swab specimens is 500 copies / mL.  A negative result does not preclude SARS-CoV-2 infection and should not be used as the sole basis for patient management decisions. A negative result may occur with improper specimen collection / handling, submission of a specimen other than nasopharyngeal swab, presence of viral mutation(s) within the areas targeted by this assay, and inadequate number of viral copies (<500 copies / mL) present.  Negative results must be combined with clinical observations, patient history, and epidemiological information.  The expected result is NEGATIVE.  Patient Fact Sheet:  BlogSelections.co.uk   Provider Fact Sheet:  https://lucas.com/   This test is not yet approved or cleared by the [place] FDA and  has been authorized for  detection and/or diagnosis of SARS-CoV-2 by FDA under an Emergency Use Authorization (EUA).  This EUA will remain in effect (meaning this test can be used) for the duration of  the COVID-19 declaration under Section 564(b)(1) of the Act, 21 U.S.C. section 360bbb-3(b)(1), unless the authorization is terminated  or revoked sooner Performed at [facility], [place] [address], [place], [place] [address]   MRSA PCR Screening     Status: None   Collection Time: 09/19/19  2:42 PM   Specimen: Nasal Mucosa; Nasopharyngeal  Result Value Ref Range Status   MRSA by PCR NEGATIVE NEGATIVE Final    Comment:        The GeneXpert MRSA Assay (FDA approved for NASAL specimens only), is one component of a comprehensive MRSA colonization surveillance program. It is not intended to diagnose MRSA infection nor to guide or monitor treatment for MRSA infections. Performed at [facility], [place] [address], [place], [place] [address]       Studies: VAS [place] ABI WITH/WO TBI  Result Date: 09/19/2019 LOWER EXTREMITY DOPPLER STUDY Indications: Peripheral  artery disease.  Comparison Study: No prior studies. Performing Technologist: Carlos Levering RVT  Examination Guidelines: A complete evaluation includes at minimum, Doppler waveform signals and systolic blood pressure reading at the level of bilateral brachial, anterior tibial, and posterior tibial arteries, when vessel segments are accessible. Bilateral testing is considered an integral part of a complete examination. Photoelectric Plethysmograph (PPG) waveforms and toe systolic pressure readings are included as required and additional duplex testing as needed. Limited examinations for reoccurring indications may be performed as noted.  ABI Findings: +---------+------------------+-----+---------+--------+ Right    Rt Pressure (mmHg)IndexWaveform Comment  +---------+------------------+-----+---------+--------+ Brachial 134                    triphasic         +---------+------------------+-----+---------+--------+ PTA      155               1.16 biphasic          +---------+------------------+-----+---------+--------+ DP       140               1.04 biphasic          +---------+------------------+-----+---------+--------+ Great Toe81                0.60                   +---------+------------------+-----+---------+--------+ +---------+------------------+-----+---------+-------+ Left     Lt Pressure (mmHg)IndexWaveform Comment +---------+------------------+-----+---------+-------+ Brachial 134                    triphasic        +---------+------------------+-----+---------+-------+ PTA      244               1.82 biphasic         +---------+------------------+-----+---------+-------+ DP       243               1.81 biphasic         +---------+------------------+-----+---------+-------+ Great Toe73                0.54                  +---------+------------------+-----+---------+-------+ +-------+-----------+-----------+------------+------------+  ABI/TBIToday's ABIToday's TBIPrevious ABIPrevious TBI +-------+-----------+-----------+------------+------------+ Right  1.16       0.6                                 +-------+-----------+-----------+------------+------------+ Left   1.82       0.54                                +-------+-----------+-----------+------------+------------+  Summary: Right: Resting right ankle-brachial index is within normal range. No evidence of significant right lower extremity arterial disease. The right toe-brachial index is abnormal. Left: Resting left ankle-brachial index indicates noncompressible left lower extremity arteries. The left toe-brachial index is abnormal.  *See table(s) above for measurements and observations.  Electronically signed by Harold Barban MD on 09/19/2019 at 9:43:24 PM.   Final     Scheduled Meds: . acidophilus  1 capsule Oral q AM  . dabigatran  75 mg Oral Q12H  . loratadine  10 mg Oral QHS  . metoprolol tartrate  25 mg Oral BID  . pantoprazole  40 mg Oral BID    Continuous Infusions: .  ceFAZolin (ANCEF) IV 1 g (09/20/19 0557)     LOS: 1 day     [place] Friendly, MD [facility]  If 7PM-7AM, please contact night-coverage [email or web address] 09/20/2019, 11:01 AM

## 2019-09-21 ENCOUNTER — Inpatient Hospital Stay (HOSPITAL_COMMUNITY): Payer: Medicare Other

## 2019-09-21 DIAGNOSIS — I340 Nonrheumatic mitral (valve) insufficiency: Secondary | ICD-10-CM

## 2019-09-21 DIAGNOSIS — I361 Nonrheumatic tricuspid (valve) insufficiency: Secondary | ICD-10-CM

## 2019-09-21 LAB — ECHOCARDIOGRAM COMPLETE
Height: 64 in
Weight: 2784 oz

## 2019-09-21 LAB — BASIC METABOLIC PANEL
Anion gap: 7 (ref 5–15)
BUN: 44 mg/dL — ABNORMAL HIGH (ref 8–23)
CO2: 23 mmol/L (ref 22–32)
Calcium: 8.6 mg/dL — ABNORMAL LOW (ref 8.9–10.3)
Chloride: 111 mmol/L (ref 98–111)
Creatinine, Ser: 1.82 mg/dL — ABNORMAL HIGH (ref 0.44–1.00)
GFR calc Af Amer: 26 mL/min — ABNORMAL LOW (ref >60)
GFR calc non Af Amer: 23 mL/min — ABNORMAL LOW (ref >60)
Glucose, Bld: 100 mg/dL — ABNORMAL HIGH (ref 70–99)
Potassium: 4.6 mmol/L (ref 3.5–5.1)
Sodium: 141 mmol/L (ref 135–145)

## 2019-09-21 LAB — CBC WITH DIFFERENTIAL/PLATELET
Abs Immature Granulocytes: 0.04 10*3/uL (ref 0.00–0.07)
Basophils Absolute: 0.1 10*3/uL (ref 0.0–0.1)
Basophils Relative: 2 %
Eosinophils Absolute: 0.3 10*3/uL (ref 0.0–0.5)
Eosinophils Relative: 4 %
HCT: 33.2 % — ABNORMAL LOW (ref 36.0–46.0)
Hemoglobin: 9.7 g/dL — ABNORMAL LOW (ref 12.0–15.0)
Immature Granulocytes: 1 %
Lymphocytes Relative: 11 %
Lymphs Abs: 0.9 10*3/uL (ref 0.7–4.0)
MCH: 19.8 pg — ABNORMAL LOW (ref 26.0–34.0)
MCHC: 29.2 g/dL — ABNORMAL LOW (ref 30.0–36.0)
MCV: 67.9 fL — ABNORMAL LOW (ref 80.0–100.0)
Monocytes Absolute: 1.2 10*3/uL — ABNORMAL HIGH (ref 0.1–1.0)
Monocytes Relative: 16 %
Neutro Abs: 5.3 10*3/uL (ref 1.7–7.7)
Neutrophils Relative %: 66 %
Platelets: 180 10*3/uL (ref 150–400)
RBC: 4.89 MIL/uL (ref 3.87–5.11)
RDW: 17.6 % — ABNORMAL HIGH (ref 11.5–15.5)
WBC: 7.9 10*3/uL (ref 4.0–10.5)
nRBC: 0.3 % — ABNORMAL HIGH (ref 0.0–0.2)

## 2019-09-21 MED ORDER — FUROSEMIDE 10 MG/ML IJ SOLN
20.0000 mg | Freq: Every day | INTRAMUSCULAR | Status: DC
  Administered 2019-09-21 – 2019-09-22 (×2): 20 mg via INTRAVENOUS
  Filled 2019-09-21 (×2): qty 2

## 2019-09-21 NOTE — Progress Notes (Signed)
  Echocardiogram 2D Echocardiogram has been performed.  Lori [name] M 09/21/2019, 1:50 PM

## 2019-09-21 NOTE — Progress Notes (Signed)
PROGRESS NOTE  Lori [name] BDZ:[id number] DOB: 21-Oct-1918 DOA: 09/18/2019 PCP: Leanna Battles, MD  HPI/Recap of past 24 hours: HPI from [name]  86 year old female who presents with complaints of lower extremity swelling and cellulitis.  She has seen her PCP and was prescribed antibiotics doxycycline, then clindamycin.  This started approximately 3 weeks ago. Pt developed worsening swelling and redness.  She decided to come to the ER. Pt has chronic lower extremity lymphedema and states she gets cellulitis approximately once annually. History provided by the patient who is alert and oriented.    Today, patient denies any new complaints, still with bilateral lower extremity swelling and erythema.  Denies any chest pain, shortness of breath, abdominal pain, nausea/vomiting, fever/chills.  Spoke to granddaughter over the phone who was concerned about patient's confusion overnight and earlier this morning. Pt noted to be oriented to person, place, time when I saw her at bedside.  Explained to granddaughter that it may be a change in environment causing some sort of confusion.  Agree to hold patient's Claritin for now as daughter thought cefazolin may be the cause of her acute confusion.  Will reevaluate in the a.m.    Assessment/Plan: Active Problems:   Asthma   Atrial fibrillation (HCC)   Essential hypertension   Pulmonary HTN (HCC)   Chronic venous insufficiency   CKD (chronic kidney disease), stage IV (HCC)   Cellulitis   Chronic diastolic CHF (congestive heart failure) (HCC)   Bilateral lower extremity cellulitis History of chronic venous insufficiency Currently afebrile, with no leukocytosis Continue IV Ancef Continue daily wound care, with Kerlex, Ace wrap for light compression  AKI on CKD stage IV Creatinine around 1.4-1.7 Daily BMP  Anemia of chronic kidney disease Hemoglobin around baseline Anemia panel pending Daily CBC  Paroxysmal A.  Fib/hypertension Currently rate controlled Continue Lopressor, restart Pradaxa  Chronic diastolic HF BNP 341 Chest x-ray with pulmonary vascular congestion Echo done in 2018 showed EF of 65 to 70%, PA peak pressure 48 mmHg, moderate sized loculated pericardial effusion Repeat echo pending Start IV Lasix 20 mg daily Monitor closely  GERD Continue PPI  Chronic venous insufficiency Continue Kerlex, Ace wrap for light compression        Malnutrition Type:      Malnutrition Characteristics:      Nutrition Interventions:       Estimated body mass index is 29.87 kg/m as calculated from the following:   Height as of this encounter: 5\' 4"  (1.626 m).   Weight as of this encounter: 78.9 kg.     Code Status: DNR  Family Communication: Discussed with granddaughter on 09/21/2019, all questions answered  Disposition Plan: Patient lives alone, came from home.  Requiring IV Lasix for diuresis, PT recommending 24-hour assistance versus home health PT. granddaughter wants patient to have rehab prior before being discharged   Consultants:  None  Procedures:  None  Antimicrobials:  IV Ancef  DVT prophylaxis: Pradaxa   Objective: Vitals:   09/20/19 2028 09/20/19 2345 09/21/19 0606 09/21/19 1332  BP: 133/79 135/78 123/71 (!) 127/59  Pulse: 81 92 93 (!) 56  Resp: 18  16 18   Temp: 98 F (36.7 C)  98.4 F (36.9 C) 97.9 F (36.6 C)  TempSrc: Oral  Oral   SpO2: 93%  96% 94%  Weight:      Height:        Intake/Output Summary (Last 24 hours) at 09/21/2019 1557 Last data filed at 09/21/2019 1000 Gross per 24 hour  Intake 50 ml  Output 200 ml  Net -150 ml   Filed Weights   09/18/19 2115  Weight: 78.9 kg    Exam:  General: NAD, alert, oriented x3  Cardiovascular: S1, S2 present  Respiratory: CTAB  Abdomen: Soft, nontender, nondistended, bowel sounds present  Musculoskeletal: +1 bilateral pedal edema noted, erythema noted, currently wrapped in Ace  wrap  Skin:  Noted bruises  Psychiatry: Normal mood    Data Reviewed: CBC: Recent Labs  Lab 09/18/19 2224 09/19/19 0643 09/20/19 0544 09/21/19 0556  WBC 8.2 7.2 8.1 7.9  NEUTROABS 5.7  --  6.0 5.3  HGB 9.6* 9.7* 10.5* 9.7*  HCT 33.2* 32.9* 36.7 33.2*  MCV 68.5* 68.1* 68.6* 67.9*  PLT 220 205 208 245   Basic Metabolic Panel: Recent Labs  Lab 09/18/19 2224 09/19/19 0643 09/20/19 0544 09/21/19 0556  NA 140 138 140 141  K 4.9 4.5 4.8 4.6  CL 104 107 109 111  CO2 26 22 22 23   GLUCOSE 112* 92 117* 100*  BUN 55* 55* 50* 44*  CREATININE 2.32* 2.12* 1.98* 1.82*  CALCIUM 8.9 8.6* 9.0 8.6*   GFR: Estimated Creatinine Clearance: 17.5 mL/min (A) (by C-G formula based on SCr of 1.82 mg/dL (H)). Liver Function Tests: Recent Labs  Lab 09/18/19 2224  AST 20  ALT 12  ALKPHOS 77  BILITOT 0.9  PROT 6.8  ALBUMIN 3.5   No results for input(s): LIPASE, AMYLASE in the last 168 hours. No results for input(s): AMMONIA in the last 168 hours. Coagulation Profile: No results for input(s): INR, PROTIME in the last 168 hours. Cardiac Enzymes: No results for input(s): CKTOTAL, CKMB, CKMBINDEX, TROPONINI in the last 168 hours. BNP (last 3 results) No results for input(s): PROBNP in the last 8760 hours. HbA1C: No results for input(s): HGBA1C in the last 72 hours. CBG: No results for input(s): GLUCAP in the last 168 hours. Lipid Profile: No results for input(s): CHOL, HDL, LDLCALC, TRIG, CHOLHDL, LDLDIRECT in the last 72 hours. Thyroid Function Tests: No results for input(s): TSH, T4TOTAL, FREET4, T3FREE, THYROIDAB in the last 72 hours. Anemia Panel: No results for input(s): VITAMINB12, FOLATE, FERRITIN, TIBC, IRON, RETICCTPCT in the last 72 hours. Urine analysis:    Component Value Date/Time   COLORURINE YELLOW 09/19/2019 0843   APPEARANCEUR CLEAR 09/19/2019 0843   LABSPEC 1.019 09/19/2019 0843   PHURINE 6.0 09/19/2019 0843   GLUCOSEU NEGATIVE 09/19/2019 0843   HGBUR  NEGATIVE 09/19/2019 0843   BILIRUBINUR NEGATIVE 09/19/2019 0843   KETONESUR NEGATIVE 09/19/2019 0843   PROTEINUR NEGATIVE 09/19/2019 0843   UROBILINOGEN 0.2 07/10/2012 1122   NITRITE NEGATIVE 09/19/2019 0843   LEUKOCYTESUR TRACE (A) 09/19/2019 0843   Sepsis Labs: @LABRCNTIP (procalcitonin:4,lacticidven:4)  ) Recent Results (from the past 240 hour(s))  SARS Coronavirus 2 by RT PCR     Status: None   Collection Time: 09/18/19 11:19 PM  Result Value Ref Range Status   SARS Coronavirus 2 NEGATIVE NEGATIVE Final    Comment: (NOTE) Result indicates the ABSENCE of SARS-CoV-2 RNA in the patient specimen.  The lowest concentration of SARS-CoV-2 viral copies this assay can detect in nasopharyngeal swab specimens is 500 copies / mL.  A negative result does not preclude SARS-CoV-2 infection and should not be used as the sole basis for patient management decisions. A negative result may occur with improper specimen collection / handling, submission of a specimen other than nasopharyngeal swab, presence of viral mutation(s) within the areas targeted by this assay, and inadequate number  of viral copies (<500 copies / mL) present.  Negative results must be combined with clinical observations, patient history, and epidemiological information.  The expected result is NEGATIVE.  Patient Fact Sheet:  BlogSelections.co.uk   Provider Fact Sheet:  https://lucas.com/   This test is not yet approved or cleared by the [place] FDA and  has been authorized for  detection and/or diagnosis of SARS-CoV-2 by FDA under an Emergency Use Authorization (EUA).  This EUA will remain in effect (meaning this test can be used) for the duration of  the COVID-19 declaration under Section 564(b)(1) of the Act, 21 U.S.C. section 360bbb-3(b)(1), unless the authorization is terminated or revoked sooner Performed at [facility], [place] [address] [place],  [place] [address]   MRSA PCR Screening     Status: None   Collection Time: 09/19/19  2:42 PM   Specimen: Nasal Mucosa; Nasopharyngeal  Result Value Ref Range Status   MRSA by PCR NEGATIVE NEGATIVE Final    Comment:        The GeneXpert MRSA Assay (FDA approved for NASAL specimens only), is one component of a comprehensive MRSA colonization surveillance program. It is not intended to diagnose MRSA infection nor to guide or monitor treatment for MRSA infections. Performed at [facility], [place] [address] [place], [place] [address]       Studies: No results found.  Scheduled Meds: . acidophilus  1 capsule Oral q AM  . dabigatran  75 mg Oral Q12H  . furosemide  20 mg Intravenous Daily  . metoprolol tartrate  25 mg Oral BID  . pantoprazole  40 mg Oral BID    Continuous Infusions: .  ceFAZolin (ANCEF) IV 1 g (09/21/19 1235)     LOS: 2 days     [place] Friendly, MD [facility]  If 7PM-7AM, please contact night-coverage [email or web address] 09/21/2019, 3:57 PM

## 2019-09-21 NOTE — TOC Initial Note (Signed)
Transition of Care [facility]) - Initial/Assessment Note    Patient Details  Name: Lori [name] MRN: [id number] Date of Birth: 1918/11/26  Transition of Care [facility]) CM/SW Contact:    Trish Mage, LCSW Phone Number: 09/21/2019, 3:40 PM  Clinical Narrative:    Found patient ot be pleasantly confused.  Called granddaughter, who stated that this is not patient's baseline, and that she recently got off phone with Dr and was being updated about plan to get patient back to baseline. Lori [name] states her grandmother lives alone, has been "sharp as a tack" until admission, that recently a friend has been coming into the house to help with Bathing, chores around house. Lori [name] does think that rehab is probably the most appropriate dispositional plan, but wants the three of [place] to talk about it tomorrow when her grandmother is thinking more clearly. TOC will continue to follow during the course of hospitalization.                Expected Discharge Plan: Skilled Nursing Facility Barriers to Discharge: Other (comment)(Interventions in place to help improve patient's mentation)   Patient Goals and CMS Choice        Expected Discharge Plan and Services Expected Discharge Plan: [place]   Discharge Planning Services: CM Consult   Living arrangements for the past 2 months: Single Family Home                                      Prior Living Arrangements/Services Living arrangements for the past 2 months: Single Family Home Lives with:: Self Patient language and need for interpreter reviewed:: Yes Do you feel safe going back to the place where you live?: Yes      Need for Family Participation in Patient Care: Yes (Comment) Care giver support system in place?: Yes (comment) Current home services: DME Criminal Activity/Legal Involvement Pertinent to Current Situation/Hospitalization: No - Comment as needed  Activities of Daily Living Home Assistive Devices/Equipment:  Walker (specify type) ADL Screening (condition at time of admission) Patient's cognitive ability adequate to safely complete daily activities?: Yes Is the patient deaf or have difficulty hearing?: Yes Does the patient have difficulty seeing, even when wearing glasses/contacts?: Yes Does the patient have difficulty concentrating, remembering, or making decisions?: No Patient able to express need for assistance with ADLs?: No Does the patient have difficulty dressing or bathing?: Yes Independently performs ADLs?: No Communication: Needs assistance Is this a change from baseline?: Pre-admission baseline Dressing (OT): Needs assistance Is this a change from baseline?: Pre-admission baseline Grooming: Needs assistance Is this a change from baseline?: Pre-admission baseline Feeding: Needs assistance Is this a change from baseline?: Pre-admission baseline Bathing: Needs assistance Is this a change from baseline?: Pre-admission baseline Toileting: Needs assistance Is this a change from baseline?: Pre-admission baseline In/Out Bed: Needs assistance Is this a change from baseline?: Pre-admission baseline Walks in Home: Independent Does the patient have difficulty walking or climbing stairs?: Yes(Uses rolling walker) Weakness of Legs: Left Weakness of Arms/Hands: None  Permission Sought/Granted Permission sought to share information with : Family Supports Permission granted to share information with : Yes, Verbal Permission Granted  Share Information with NAME: Lori [name]     Permission granted to share info w Relationship: Granddaughter  Permission granted to share info w Contact Information: [phone]  Emotional Assessment Appearance:: Appears stated age Attitude/Demeanor/Rapport: Engaged Affect (typically observed): Pleasant Orientation: :  Oriented to Place Alcohol / Substance Use: Not Applicable Psych Involvement: No (comment)  Admission diagnosis:  Dehydration  [E86.0] Cellulitis [L03.90] Cellulitis of lower extremity, unspecified laterality [V75.051] Patient Active Problem List   Diagnosis Date Noted  . Cellulitis 02/26/2018  . Chronic diastolic CHF (congestive heart failure) ([place]) 02/26/2018  . Chronic venous insufficiency 08/17/2017  . CKD (chronic kidney disease), stage IV ([place]) 08/17/2017  . Moderate mitral regurgitation 11/04/2016  . Elevated brain natriuretic peptide (BNP) level 11/04/2016  . Pulmonary HTN ([place]) 11/04/2016  . Hyponatremia 07/13/2012    Class: Acute  . Essential hypertension 03/31/2012  . Atrial fibrillation ([place]) 07/29/2011  . Peripheral edema 07/29/2011  . Cough 06/26/2010  . ANEMIA, IRON DEFICIENCY 06/10/2010  . ANEMIA-UNSPECIFIED 06/10/2010  . Asthma 06/10/2010  . PERSONAL HX COLONIC POLYPS 06/10/2010   PCP:  Leanna Battles, MD Pharmacy:   [id number] [address], [place] [address] Phone: [phone] Fax: [phone]  [facility] [place], [place] [address] Phone: [phone] Fax: [phone]  [facility] [place], [place] [address] [place] [address] Phone: [phone] Fax: [phone]     Social Determinants of Health (SDOH) Interventions    Readmission Risk Interventions No flowsheet data found.

## 2019-09-21 NOTE — Progress Notes (Signed)
Spoke with pts granddaughter G. Bell regarding concerns from last night of inability to speak with RN for pt status update after numerous attempts.  Service recovery provided and notified [place] to follow up with the granddaughter ASAP to discuss meds and notified Anderson [place] pt RN that granddaughter requests meds not to be given until MD speaks with granddaughter.  No other concerns at this time per the granddaugther.

## 2019-09-21 NOTE — Progress Notes (Signed)
Followed up with pt granddaughter to complete video call with grandmother.  Per granddaughter Caroline More, she had spoken with the RN and MD; and there was a plan in place and okay to proceed with meds. Video call completed and pt and granddaughter appreciative of the video call.  No issues at this time.

## 2019-09-21 NOTE — Progress Notes (Signed)
OT Cancellation Note  Patient Details Name: Lori [name] MRN: [id number] DOB: 11/09/1918   Cancelled Treatment:    Reason Eval/Treat Not Completed: Other (comment). Pt reports she just walked with PT and is fatigued. Request OT to come back later. Plan to reattempt at a later time/date.   Tyrone Schimke, OT Acute Rehabilitation Services Pager: [phone] Office: [phone]  09/21/2019, 11:08 AM

## 2019-09-21 NOTE — Evaluation (Signed)
Physical Therapy Evaluation Patient Details Name: Lori [name] MRN: [id number] DOB: 29-Jan-1919 Today's Date: 09/21/2019   History of Present Illness  86 year old female who presents with complaints of lower extremity swelling and cellulitis, afib, acute on CKD. PMH of asthma, HTN, CKD, CHF, L TKA, R THA.  Clinical Impression  The patient  Reports living independently with assistance of granddaughter and an"aid" to assist with bath 3 days/week. Patient did mobilize to sitting and ambulated  X 10'. Patient reports fatigue with activity. Reports that  The legs were not painful with mobliity. Patient is adamant about  Not going to a SNF. Patient is HOH and does well with written communication. Pt admitted with above diagnosis.  Pt currently with functional limitations due to the deficits listed below (see PT Problem List). Pt will benefit from skilled PT to increase their independence and safety with mobility to allow discharge to the venue listed below.       Follow Up Recommendations Home health PT;Supervision/Assistance - 24 hour    Equipment Recommendations  None recommended by PT    Recommendations for Other Services       Precautions / Restrictions Precautions Precautions: Fall Precaution Comments: bilat wounds in legs, wrapped      Mobility  Bed Mobility Overal bed mobility: Needs Assistance Bed Mobility: Supine to Sit     Supine to sit: HOB elevated;Min assist     General bed mobility comments: assist with trunk and to scoot  out using HHA  Transfers Overall transfer level: Needs assistance Equipment used: Rolling walker (2 wheeled) Transfers: Sit to/from Stand Sit to Stand: Min assist;From elevated surface         General transfer comment: assist to rise and steady at RW.  Ambulation/Gait Ambulation/Gait assistance: Min assist Gait Distance (Feet): 10 Feet Assistive device: Rolling walker (2 wheeled) Gait Pattern/deviations: Step-to pattern;Step-through  pattern;Trunk flexed Gait velocity: decr   General Gait Details: trunk very forward flexed, small shuffling steps taken,. turns slowly  Financial trader [place] (Stroke Patients Only)       Balance Overall balance assessment: Needs assistance Sitting-balance support: Bilateral upper extremity supported;Feet supported Sitting balance-Leahy Scale: Good     Standing balance support: Bilateral upper extremity supported;During functional activity Standing balance-Leahy Scale: Fair Standing balance comment: reliant on UE support                             Pertinent Vitals/Pain Pain Assessment: Faces Faces Pain Scale: Hurts a little bit Pain Location: legs when standing Pain Descriptors / Indicators: Grimacing;Guarding;Discomfort Pain Intervention(s): Monitored during session;Repositioned    Home Living Family/patient expects to be discharged to:: Private residence Living Arrangements: Alone Available Help at Discharge: Family;Available PRN/intermittently Type of Home: Apartment Home Access: Level entry     Home Layout: One level Home Equipment: Walker - 4 wheels Additional Comments: granddaughter  assists as able    Prior Function Level of Independence: Needs assistance   Gait / Transfers Assistance Needed: Mod I in apt using 4 wheeled RW  ADL's / Homemaking Assistance Needed: as some one clean and assists with bath   Patient reports that she sleeps in a recliner.     Hand Dominance        Extremity/Trunk Assessment        Lower Extremity Assessment Lower Extremity Assessment: Generalized weakness;LLE deficits/detail;RLE deficits/detail RLE Deficits /  Details: wraped with ace LLE Deficits / Details: legs wrapped with aces    Cervical / Trunk Assessment Cervical / Trunk Assessment: Kyphotic  Communication      Cognition Arousal/Alertness: Awake/alert Behavior During Therapy: WFL for tasks  assessed/performed Overall Cognitive Status: Impaired/Different from baseline Area of Impairment: Orientation                 Orientation Level: Time             General Comments: was oriented to day she came to hospital but not to today's date      General Comments      Exercises     Assessment/Plan    PT Assessment Patient needs continued PT services  PT Problem List Decreased strength;Decreased balance;Decreased cognition;Decreased knowledge of precautions;Decreased mobility;Decreased knowledge of use of DME;Decreased activity tolerance;Decreased safety awareness       PT Treatment Interventions DME instruction;Functional mobility training;Patient/family education;Balance training;Gait training;Therapeutic activities;Therapeutic exercise    PT Goals (Current goals can be found in the Care Plan section)  Acute Rehab PT Goals Patient Stated Goal: to go home PT Goal Formulation: With patient Time For Goal Achievement: 10/05/19 Potential to Achieve Goals: Good    Frequency Min 3X/week   Barriers to discharge Decreased caregiver support      Co-evaluation               AM-PAC PT "6 Clicks" Mobility  Outcome Measure Help needed turning from your back to your side while in a flat bed without using bedrails?: A Little Help needed moving from lying on your back to sitting on the side of a flat bed without using bedrails?: A Little Help needed moving to and from a bed to a chair (including a wheelchair)?: A Little Help needed standing up from a chair using your arms (e.g., wheelchair or bedside chair)?: A Lot Help needed to walk in hospital room?: A Lot Help needed climbing 3-5 steps with a railing? : A Lot 6 Click Score: 15    End of Session   Activity Tolerance: Patient tolerated treatment well Patient left: in chair;with call bell/phone within reach Nurse Communication: Mobility status PT Visit Diagnosis: Unsteadiness on feet (R26.81);Muscle  weakness (generalized) (M62.81);Difficulty in walking, not elsewhere classified (R26.2)    Time: 8250-0370 PT Time Calculation (min) (ACUTE ONLY): 52 min   Charges:   PT Evaluation $PT Eval Low Complexity: 1 Low PT Treatments $Gait Training: 8-22 mins $Self Care/Home Management: [place] Pager [phone] Office [phone]   [name] 09/21/2019, 12:43 PM

## 2019-09-22 LAB — CBC WITH DIFFERENTIAL/PLATELET
Abs Immature Granulocytes: 0.04 10*3/uL (ref 0.00–0.07)
Basophils Absolute: 0.1 10*3/uL (ref 0.0–0.1)
Basophils Relative: 1 %
Eosinophils Absolute: 0.7 10*3/uL — ABNORMAL HIGH (ref 0.0–0.5)
Eosinophils Relative: 9 %
HCT: 34.3 % — ABNORMAL LOW (ref 36.0–46.0)
Hemoglobin: 10 g/dL — ABNORMAL LOW (ref 12.0–15.0)
Immature Granulocytes: 1 %
Lymphocytes Relative: 12 %
Lymphs Abs: 1 10*3/uL (ref 0.7–4.0)
MCH: 19.7 pg — ABNORMAL LOW (ref 26.0–34.0)
MCHC: 29.2 g/dL — ABNORMAL LOW (ref 30.0–36.0)
MCV: 67.7 fL — ABNORMAL LOW (ref 80.0–100.0)
Monocytes Absolute: 1.2 10*3/uL — ABNORMAL HIGH (ref 0.1–1.0)
Monocytes Relative: 15 %
Neutro Abs: 5 10*3/uL (ref 1.7–7.7)
Neutrophils Relative %: 62 %
Platelets: 180 10*3/uL (ref 150–400)
RBC: 5.07 MIL/uL (ref 3.87–5.11)
RDW: 17.8 % — ABNORMAL HIGH (ref 11.5–15.5)
WBC: 8.1 10*3/uL (ref 4.0–10.5)
nRBC: 0 % (ref 0.0–0.2)

## 2019-09-22 LAB — FOLATE: Folate: 7.3 ng/mL (ref >5.9)

## 2019-09-22 LAB — BASIC METABOLIC PANEL
Anion gap: 6 (ref 5–15)
BUN: 45 mg/dL — ABNORMAL HIGH (ref 8–23)
CO2: 26 mmol/L (ref 22–32)
Calcium: 8.6 mg/dL — ABNORMAL LOW (ref 8.9–10.3)
Chloride: 108 mmol/L (ref 98–111)
Creatinine, Ser: 1.86 mg/dL — ABNORMAL HIGH (ref 0.44–1.00)
GFR calc Af Amer: 26 mL/min — ABNORMAL LOW (ref >60)
GFR calc non Af Amer: 22 mL/min — ABNORMAL LOW (ref >60)
Glucose, Bld: 102 mg/dL — ABNORMAL HIGH (ref 70–99)
Potassium: 4.6 mmol/L (ref 3.5–5.1)
Sodium: 140 mmol/L (ref 135–145)

## 2019-09-22 LAB — IRON AND TIBC
Iron: 25 ug/dL — ABNORMAL LOW (ref 28–170)
Saturation Ratios: 9 % — ABNORMAL LOW (ref 10.4–31.8)
TIBC: 265 ug/dL (ref 250–450)
UIBC: 240 ug/dL

## 2019-09-22 LAB — FERRITIN: Ferritin: 64 ng/mL (ref 11–307)

## 2019-09-22 LAB — VITAMIN B12: Vitamin B-12: 547 pg/mL (ref 180–914)

## 2019-09-22 MED ORDER — FERROUS SULFATE 325 (65 FE) MG PO TABS: 325.0000 mg | ORAL_TABLET | Freq: Every day | ORAL | Status: DC

## 2019-09-22 MED ORDER — FUROSEMIDE 40 MG PO TABS
40.0000 mg | ORAL_TABLET | Freq: Every day | ORAL | Status: DC
  Administered 2019-09-23 – 2019-09-24 (×2): 40 mg via ORAL
  Filled 2019-09-22 (×2): qty 1

## 2019-09-22 NOTE — Progress Notes (Signed)
PROGRESS NOTE  Lori [name] MVE:[id number] DOB: 01-10-1919 DOA: 09/18/2019 PCP: Leanna Battles, MD  HPI/Recap of past 24 hours: HPI from [name]  86 year old female who presents with complaints of lower extremity swelling and cellulitis.  She has seen her PCP and was prescribed antibiotics doxycycline, then clindamycin.  This started approximately 3 weeks ago. Pt developed worsening swelling and redness.  She decided to come to the ER. Pt has chronic lower extremity lymphedema and states she gets cellulitis approximately once annually. History provided by the patient who is alert and oriented.    Today, pt reported generalized fatigue, otherwise denies any new complaints.  Patient denies any chest pain, shortness of breath, abdominal pain, nausea/vomiting, fever/chills.    Assessment/Plan: Active Problems:   Asthma   Atrial fibrillation (HCC)   Essential hypertension   Pulmonary HTN (HCC)   Chronic venous insufficiency   CKD (chronic kidney disease), stage IV (HCC)   Cellulitis   Chronic diastolic CHF (congestive heart failure) (HCC)   Bilateral lower extremity cellulitis History of chronic venous insufficiency Currently afebrile, with no leukocytosis Continue IV Ancef Continue daily wound care, with Kerlex, Ace wrap for light compression  AKI on CKD stage IV Creatinine around 1.4-1.7 Daily BMP  Anemia of chronic kidney disease Hemoglobin around baseline Anemia panel showed iron 25, sats 9, Vit B12 547, folate 7.3 Granddaughter stated she has had anemia for a very long time, not resolved with iron supplements as she has tried it in the past Daily CBC  Paroxysmal A. Fib/hypertension Currently rate controlled Continue Lopressor, restart Pradaxa  Chronic diastolic HF/pulmonary hypertension BNP 482 Chest x-ray with pulmonary vascular congestion Echo done in 2018 showed EF of 65 to 70%, PA peak pressure 48 mmHg, moderate sized loculated pericardial  effusion Repeat echo done showed EF of 55 to 60%, moderately elevated pulmonary artery systolic pressure which is about 57 mmHg, severe tricuspid valve regurgitation, right ventricular systolic function is moderately reduced as it is severely enlarged Continue p.o. Lasix Monitor closely  GERD Continue PPI  Chronic venous insufficiency Continue Kerlex, Ace wrap for light compression        Malnutrition Type:      Malnutrition Characteristics:      Nutrition Interventions:       Estimated body mass index is 29.87 kg/m as calculated from the following:   Height as of this encounter: 5\' 4"  (1.626 m).   Weight as of this encounter: 78.9 kg.     Code Status: DNR  Family Communication: Discussed with granddaughter on 09/22/2019, all questions answered  Disposition Plan: Patient lives alone, came from home. Awaiting SNF bed placement  Consultants:  None  Procedures:  None  Antimicrobials:  IV Ancef  DVT prophylaxis: Pradaxa   Objective: Vitals:   09/21/19 2050 09/22/19 0530 09/22/19 1309 09/22/19 1618  BP: 112/64 (!) 121/54  (!) 107/59  Pulse: 84 82  65  Resp: 20 19  16   Temp: 98 F (36.7 C) 98.2 F (36.8 C) 98.2 F (36.8 C) 98.2 F (36.8 C)  TempSrc:  Oral  Oral  SpO2: 92% 92%  94%  Weight:      Height:        Intake/Output Summary (Last 24 hours) at 09/22/2019 1647 Last data filed at 09/22/2019 1628 Gross per 24 hour  Intake 889 ml  Output 850 ml  Net 39 ml   Filed Weights   09/18/19 2115  Weight: 78.9 kg    Exam:  General: NAD, alert, oriented  x3  Cardiovascular: S1, S2 present  Respiratory: CTAB  Abdomen: Soft, nontender, nondistended, bowel sounds present  Musculoskeletal: Trace bilateral pedal edema noted, erythema noted, currently wrapped in Ace wrap  Skin:  Noted bruises  Psychiatry: Normal mood    Data Reviewed: CBC: Recent Labs  Lab 09/18/19 2224 09/19/19 0643 09/20/19 0544 09/21/19 0556 09/22/19 0527   WBC 8.2 7.2 8.1 7.9 8.1  NEUTROABS 5.7  --  6.0 5.3 5.0  HGB 9.6* 9.7* 10.5* 9.7* 10.0*  HCT 33.2* 32.9* 36.7 33.2* 34.3*  MCV 68.5* 68.1* 68.6* 67.9* 67.7*  PLT 220 205 208 180 161   Basic Metabolic Panel: Recent Labs  Lab 09/18/19 2224 09/19/19 0643 09/20/19 0544 09/21/19 0556 09/22/19 0527  NA 140 138 140 141 140  K 4.9 4.5 4.8 4.6 4.6  CL 104 107 109 111 108  CO2 26 22 22 23 26   GLUCOSE 112* 92 117* 100* 102*  BUN 55* 55* 50* 44* 45*  CREATININE 2.32* 2.12* 1.98* 1.82* 1.86*  CALCIUM 8.9 8.6* 9.0 8.6* 8.6*   GFR: Estimated Creatinine Clearance: 17.2 mL/min (A) (by C-G formula based on SCr of 1.86 mg/dL (H)). Liver Function Tests: Recent Labs  Lab 09/18/19 2224  AST 20  ALT 12  ALKPHOS 77  BILITOT 0.9  PROT 6.8  ALBUMIN 3.5   No results for input(s): LIPASE, AMYLASE in the last 168 hours. No results for input(s): AMMONIA in the last 168 hours. Coagulation Profile: No results for input(s): INR, PROTIME in the last 168 hours. Cardiac Enzymes: No results for input(s): CKTOTAL, CKMB, CKMBINDEX, TROPONINI in the last 168 hours. BNP (last 3 results) No results for input(s): PROBNP in the last 8760 hours. HbA1C: No results for input(s): HGBA1C in the last 72 hours. CBG: No results for input(s): GLUCAP in the last 168 hours. Lipid Profile: No results for input(s): CHOL, HDL, LDLCALC, TRIG, CHOLHDL, LDLDIRECT in the last 72 hours. Thyroid Function Tests: No results for input(s): TSH, T4TOTAL, FREET4, T3FREE, THYROIDAB in the last 72 hours. Anemia Panel: Recent Labs    09/22/19 0527  VITAMINB12 547  FOLATE 7.3  FERRITIN 64  TIBC 265  IRON 25*   Urine analysis:    Component Value Date/Time   COLORURINE YELLOW 09/19/2019 0843   APPEARANCEUR CLEAR 09/19/2019 0843   LABSPEC 1.019 09/19/2019 0843   PHURINE 6.0 09/19/2019 0843   GLUCOSEU NEGATIVE 09/19/2019 0843   HGBUR NEGATIVE 09/19/2019 0843   BILIRUBINUR NEGATIVE 09/19/2019 0843   KETONESUR NEGATIVE  09/19/2019 0843   PROTEINUR NEGATIVE 09/19/2019 0843   UROBILINOGEN 0.2 07/10/2012 1122   NITRITE NEGATIVE 09/19/2019 0843   LEUKOCYTESUR TRACE (A) 09/19/2019 0843   Sepsis Labs: @LABRCNTIP (procalcitonin:4,lacticidven:4)  ) Recent Results (from the past 240 hour(s))  SARS Coronavirus 2 by RT PCR     Status: None   Collection Time: 09/18/19 11:19 PM  Result Value Ref Range Status   SARS Coronavirus 2 NEGATIVE NEGATIVE Final    Comment: (NOTE) Result indicates the ABSENCE of SARS-CoV-2 RNA in the patient specimen.  The lowest concentration of SARS-CoV-2 viral copies this assay can detect in nasopharyngeal swab specimens is 500 copies / mL.  A negative result does not preclude SARS-CoV-2 infection and should not be used as the sole basis for patient management decisions. A negative result may occur with improper specimen collection / handling, submission of a specimen other than nasopharyngeal swab, presence of viral mutation(s) within the areas targeted by this assay, and inadequate number of viral copies (<500 copies /  mL) present.  Negative results must be combined with clinical observations, patient history, and epidemiological information.  The expected result is NEGATIVE.  Patient Fact Sheet:  BlogSelections.co.uk   Provider Fact Sheet:  https://lucas.com/   This test is not yet approved or cleared by the [place] FDA and  has been authorized for  detection and/or diagnosis of SARS-CoV-2 by FDA under an Emergency Use Authorization (EUA).  This EUA will remain in effect (meaning this test can be used) for the duration of  the COVID-19 declaration under Section 564(b)(1) of the Act, 21 U.S.C. section 360bbb-3(b)(1), unless the authorization is terminated or revoked sooner Performed at [facility], [place] [address], [place], [place] [address]   MRSA PCR Screening     Status: None   Collection Time: 09/19/19  2:42 PM    Specimen: Nasal Mucosa; Nasopharyngeal  Result Value Ref Range Status   MRSA by PCR NEGATIVE NEGATIVE Final    Comment:        The GeneXpert MRSA Assay (FDA approved for NASAL specimens only), is one component of a comprehensive MRSA colonization surveillance program. It is not intended to diagnose MRSA infection nor to guide or monitor treatment for MRSA infections. Performed at [facility], [place] [address] [place], [place] [address]       Studies: No results found.  Scheduled Meds: . acidophilus  1 capsule Oral q AM  . dabigatran  75 mg Oral Q12H  . [START ON 09/23/2019] furosemide  40 mg Oral Daily  . metoprolol tartrate  25 mg Oral BID  . pantoprazole  40 mg Oral BID    Continuous Infusions: .  ceFAZolin (ANCEF) IV 1 g (09/22/19 1015)     LOS: 3 days     Alma Friendly, MD [facility]  If 7PM-7AM, please contact night-coverage [email or web address] 09/22/2019, 4:47 PM

## 2019-09-22 NOTE — TOC Progression Note (Signed)
Transition of Care [facility]) - Progression Note    Patient Details  Name: Lori [name] MRN: [id number] Date of Birth: 10-11-1918  Transition of Care [facility]) CM/SW [place], [place] Phone Number: 09/22/2019, 1:38 PM  Clinical Narrative:   Spoke with granddaughter again today who, after talking with Dr., is wanting patient to go to rehab.  She promised she would call the patient when we got off the phone to talk to her about the decision.  Granddaughter identified [facility] as a preference, but also asked that I send out information all local facilities with the exception of [facility], as patient had been there previously and had not had a good experience.  Bed search implemented. TOC will continue to follow during the course of hospitalization.     Expected Discharge Plan: Skilled Nursing Facility Barriers to Discharge: SNF Pending bed offer  Expected Discharge Plan and Services Expected Discharge Plan: [place]   Discharge Planning Services: CM Consult   Living arrangements for the past 2 months: Single Family Home                                       Social Determinants of Health (SDOH) Interventions    Readmission Risk Interventions No flowsheet data found.

## 2019-09-22 NOTE — Care Management Important Message (Signed)
Important Message  Patient Details IM Letter given to Roque Lias SW Case Manager to present to the Patient Name: Lori [name] MRN: [id number] Date of Birth: 03/04/1919   Medicare Important Message Given:  Yes     Kerin Salen 09/22/2019, 10:28 AM

## 2019-09-22 NOTE — Progress Notes (Signed)
Wound care performed per MD order.

## 2019-09-22 NOTE — Progress Notes (Signed)
Occupational Therapy Evaluation Patient Details Name: Lori [name] MRN: [id number] DOB: Mar 25, 1919 Today's Date: 09/22/2019    History of Present Illness 86 year old female who presents with complaints of lower extremity swelling and cellulitis, afib, acute on CKD. PMH of asthma, HTN, CKD, CHF, L TKA, R THA.   Clinical Impression   Pt reports living in apartment alone with assist from PCA for bathing only. Pt reports mobilizing with rollator in home with Modified Independence. Pt was able to mobilize to [facility] for 10 feet for toileting task, requiring total assist for toileting. Pt required Max A to thread B LE briefs in sitting position after urine accident in bed. Pt was able to mobilize 10 more feet to recliner and then verbalized that she was to tired to mobilize further. Pt completed all grooming tasks in recliner with set-up assist. Overall patient requires setup to total assist for self-care tasks and Minimal assist for mobilizing with RW. Pt will benefit from continued skilled acute OT services.     Follow Up Recommendations  Supervision/Assistance - 24 hour;Home health OT(if no access to 24 hr caregiver, then SNF for rehab)    Equipment Recommendations  3 in 1 bedside commode    Recommendations for Other Services       Precautions / Restrictions Precautions Precautions: Fall Precaution Comments: bilat wounds in legs, wrapped Restrictions Weight Bearing Restrictions: No      Mobility Bed Mobility Overal bed mobility: Needs Assistance Bed Mobility: Supine to Sit     Supine to sit: HOB elevated;Min assist     General bed mobility comments: assist with trunk and to scoot  out using HHA  Transfers Overall transfer level: Needs assistance Equipment used: Rolling walker (2 wheeled) Transfers: Sit to/from Stand Sit to Stand: Min assist;From elevated surface              Balance   Sitting-balance support: Bilateral upper extremity supported;Feet  supported Sitting balance-Leahy Scale: Good     Standing balance support: Bilateral upper extremity supported;During functional activity Standing balance-Leahy Scale: Fair                             ADL either performed or assessed with clinical judgement   ADL Overall ADL's : Needs assistance/impaired Eating/Feeding: Set up   Grooming: Oral care;Wash/dry face;Wash/dry hands;Set up;Sitting   Upper Body Bathing: Set up   Lower Body Bathing: Moderate assistance   Upper Body Dressing : Set up   Lower Body Dressing: Maximal assistance   Toilet Transfer: Moderate assistance;BSC   Toileting- Clothing Manipulation and Hygiene: Total assistance   Tub/ Shower Transfer: Moderate assistance   Functional mobility during ADLs: Minimal assistance;Rolling walker       Vision Baseline Vision/History: (Hx of visual deficits )       Perception     Praxis      Pertinent Vitals/Pain Pain Assessment: No/denies pain     Hand Dominance Right   Extremity/Trunk Assessment Upper Extremity Assessment Upper Extremity Assessment: RUE deficits/detail;LUE deficits/detail   Lower Extremity Assessment Lower Extremity Assessment: Defer to PT evaluation       Communication Communication Communication: HOH   Cognition Arousal/Alertness: Awake/alert Behavior During Therapy: WFL for tasks assessed/performed                                       General Comments  B LE  cellulitis    Exercises     Shoulder Instructions      Home Living Family/patient expects to be discharged to:: Private residence Living Arrangements: Alone Available Help at Discharge: Family;Available PRN/intermittently Type of Home: Apartment Home Access: Level entry     Home Layout: One level     Bathroom Shower/Tub: Teacher, early years/pre: Standard Bathroom Accessibility: Yes How Accessible: Accessible via walker Home Equipment: Walker - 4 wheels;Grab bars -  tub/shower   Additional Comments: granddaughter  assists as able      Prior Functioning/Environment Level of Independence: Needs assistance  Gait / Transfers Assistance Needed: Mod I in apt using 4 wheeled RW ADL's / Homemaking Assistance Needed: as some one clean and assists with bath            OT Problem List: Decreased strength;Decreased activity tolerance;Impaired balance (sitting and/or standing);Decreased safety awareness;Decreased knowledge of use of DME or AE;Increased edema      OT Treatment/Interventions: Self-care/ADL training;Therapeutic exercise;Therapeutic activities;Patient/family education;Balance training;Energy conservation    OT Goals(Current goals can be found in the care plan section) Acute Rehab OT Goals Patient Stated Goal: to go home and have therapy at home OT Goal Formulation: With patient Time For Goal Achievement: 10/06/19 Potential to Achieve Goals: Fair ADL Goals Pt Will Perform Grooming: with set-up;standing Pt Will Perform Lower Body Dressing: with min guard assist Pt Will Transfer to Toilet: with min guard assist;ambulating Pt Will Perform Toileting - Clothing Manipulation and hygiene: with min guard assist  OT Frequency: Min 2X/week   Barriers to D/C:            Co-evaluation              AM-PAC OT "6 Clicks" Daily Activity     Outcome Measure Help from another person eating meals?: A Little Help from another person taking care of personal grooming?: A Little Help from another person toileting, which includes using toliet, bedpan, or urinal?: Total Help from another person bathing (including washing, rinsing, drying)?: A Lot Help from another person to put on and taking off regular upper body clothing?: A Little Help from another person to put on and taking off regular lower body clothing?: A Lot 6 Click Score: 14   End of Session Equipment Utilized During Treatment: Gait belt;Rolling walker Nurse Communication: Mobility  status  Activity Tolerance: Patient limited by fatigue Patient left: in chair;with call bell/phone within reach;with chair alarm set  OT Visit Diagnosis: Unsteadiness on feet (R26.81);Muscle weakness (generalized) (M62.81)                Time: 3491-7915 OT Time Calculation (min): 41 min Charges:  OT General Charges $OT Visit: 1 Visit OT Evaluation $OT Eval Moderate Complexity: 1 Mod OT Treatments $Self Care/Home Management : 8-22 mins $Therapeutic Activity: 8-22 mins  Ade Stmarie OTR/L   Lori [name] 09/22/2019, 12:00 PM

## 2019-09-22 NOTE — NC FL2 (Signed)
[place] LEVEL OF CARE SCREENING TOOL     IDENTIFICATION  Patient Name: Lori [name] Birthdate: 09-Jun-1919 Sex: female Admission Date (Current Location): 09/18/2019  [facility] and [place] Number:  Herbalist and Address:         Provider Number: [id number]  Attending Physician Name and Address:  Alma Friendly, MD  Relative Name and Phone Number:  Real Cons [phone]    Current Level of Care: Hospital Recommended Level of Care: [place] Prior Approval Number:    Date Approved/Denied:   PASRR Number: [id number]  Discharge Plan: SNF    Current Diagnoses: Patient Active Problem List   Diagnosis Date Noted  . Cellulitis 02/26/2018  . Chronic diastolic CHF (congestive heart failure) ([place]) 02/26/2018  . Chronic venous insufficiency 08/17/2017  . CKD (chronic kidney disease), stage IV ([place]) 08/17/2017  . Moderate mitral regurgitation 11/04/2016  . Elevated brain natriuretic peptide (BNP) level 11/04/2016  . Pulmonary HTN ([place]) 11/04/2016  . Hyponatremia 07/13/2012  . Essential hypertension 03/31/2012  . Atrial fibrillation ([place]) 07/29/2011  . Peripheral edema 07/29/2011  . Cough 06/26/2010  . ANEMIA, IRON DEFICIENCY 06/10/2010  . ANEMIA-UNSPECIFIED 06/10/2010  . Asthma 06/10/2010  . PERSONAL HX COLONIC POLYPS 06/10/2010    Orientation RESPIRATION BLADDER Height & Weight     Self, Time, Situation, Place  Normal External catheter Weight: 174 lb (78.9 kg) Height:  5\' 4"  (162.6 cm)  BEHAVIORAL SYMPTOMS/MOOD NEUROLOGICAL BOWEL NUTRITION STATUS  Other (Comment)(none) (none) Continent Diet(see discharge summary)  AMBULATORY STATUS COMMUNICATION OF NEEDS Skin   Extensive Assist Verbally PU Stage and Appropriate Care   PU Stage 2 Dressing: (left buttocks)                   Personal Care Assistance Level of Assistance  Bathing, Feeding, Dressing Bathing Assistance: Maximum assistance Feeding  assistance: Independent Dressing Assistance: Limited assistance     Functional Limitations Info  Hearing, Sight, Speech Sight Info: Impaired Hearing Info: Impaired Speech Info: Adequate    SPECIAL CARE FACTORS FREQUENCY  PT (By licensed PT), OT (By licensed OT)     PT Frequency: 5x/w OT Frequency: 5x/w            Contractures Contractures Info: Not present    Additional Factors Info  Allergies, Code Status Code Status Info: DNR Allergies Info: Dust Mite extract, Mold extract, latex, tape           Current Medications (09/22/2019):  This is the current hospital active medication list Current Facility-Administered Medications  Medication Dose Route Frequency Provider Last Rate Last Admin  . acetaminophen (TYLENOL) tablet 650 mg  650 mg Oral Q6H PRN Quintella Baton, MD   650 mg at 09/19/19 2257   Or  . acetaminophen (TYLENOL) suppository 650 mg  650 mg Rectal Q6H PRN Crosley, Debby, MD      . acidophilus (RISAQUAD) capsule 1 capsule  1 capsule Oral q AM Crosley, Debby, MD   1 capsule at 09/22/19 1001  . ceFAZolin (ANCEF) IVPB 1 g/50 mL premix  1 g Intravenous Q12H Florencia Reasons, MD 100 mL/hr at 09/22/19 1015 1 g at 09/22/19 1015  . dabigatran (PRADAXA) capsule 75 mg  75 mg Oral Q12H Alma Friendly, MD   75 mg at 09/22/19 0959  . [START ON 09/23/2019] furosemide (LASIX) tablet 40 mg  40 mg Oral Daily Alma Friendly, MD      . metoprolol tartrate (LOPRESSOR) tablet 25 mg  25  mg Oral BID Quintella Baton, MD   25 mg at 09/22/19 0959  . ondansetron (ZOFRAN) injection 4 mg  4 mg Intravenous Q8H PRN Lang Snow, FNP   4 mg at 09/19/19 2324   Or  . ondansetron (ZOFRAN-ODT) disintegrating tablet 4 mg  4 mg Oral Q8H PRN Lang Snow, FNP      . pantoprazole (PROTONIX) EC tablet 40 mg  40 mg Oral BID Quintella Baton, MD   40 mg at 09/22/19 0959  . polyethylene glycol (MIRALAX / GLYCOLAX) packet 17 g  17 g Oral Daily PRN Quintella Baton, MD         Discharge  Medications: Please see discharge summary for a list of discharge medications.  Relevant Imaging Results:  Relevant Lab Results:   Additional Information SSN [id number]  Bethann Berkshire, LCSW

## 2019-09-22 NOTE — Progress Notes (Signed)
PT Cancellation Note  Patient Details Name: Lori [name] MRN: [id number] DOB: 14-Mar-1919   Cancelled Treatment:     pt OOB in recliner "very comfortable".  Just recently was up using [facility] with nursing staff, pt politely declined to get up "right now".  "They gave me the lasix and I'm getting up a lot".      Rica Koyanagi  PTA Acute  Rehabilitation Services Pager      [phone] Office      [phone]

## 2019-09-23 LAB — BASIC METABOLIC PANEL
Anion gap: 9 (ref 5–15)
BUN: 45 mg/dL — ABNORMAL HIGH (ref 8–23)
CO2: 25 mmol/L (ref 22–32)
Calcium: 8.5 mg/dL — ABNORMAL LOW (ref 8.9–10.3)
Chloride: 107 mmol/L (ref 98–111)
Creatinine, Ser: 1.87 mg/dL — ABNORMAL HIGH (ref 0.44–1.00)
GFR calc Af Amer: 25 mL/min — ABNORMAL LOW (ref >60)
GFR calc non Af Amer: 22 mL/min — ABNORMAL LOW (ref >60)
Glucose, Bld: 106 mg/dL — ABNORMAL HIGH (ref 70–99)
Potassium: 4.6 mmol/L (ref 3.5–5.1)
Sodium: 141 mmol/L (ref 135–145)

## 2019-09-23 NOTE — Progress Notes (Signed)
PROGRESS NOTE  Lori [name] KDX:[id number] DOB: [birth year] DOA: 09/18/2019 PCP: Leanna Battles, MD  HPI/Recap of past 24 hours: HPI from [name]  86 year old female who presents with complaints of lower extremity swelling and cellulitis.  She has seen her PCP and was prescribed antibiotics doxycycline, then clindamycin.  This started approximately 3 weeks ago. Pt developed worsening swelling and redness.  She decided to come to the ER. Pt has chronic lower extremity lymphedema and states she gets cellulitis approximately once annually. History provided by the patient who is alert and oriented.    Today, patient denies any new complaints.  Refusing SNF, eager to be discharged home.  Discussed extensively with patient and granddaughter about recommendations for short-term rehab before going home to be able to increase strength and endurance.  Patient declined, and wants to go home.  Daughter unavailable this weekend to come pick her up.    Assessment/Plan: Active Problems:   Asthma   Atrial fibrillation (HCC)   Essential hypertension   Pulmonary HTN (HCC)   Chronic venous insufficiency   CKD (chronic kidney disease), stage IV (HCC)   Cellulitis   Chronic diastolic CHF (congestive heart failure) (HCC)   Bilateral lower extremity cellulitis History of chronic venous insufficiency Currently afebrile, with no leukocytosis Continue IV Ancef Continue daily wound care, with Kerlex, Ace wrap for light compression  AKI on CKD stage IV Creatinine around 1.4-1.7 Daily BMP  Anemia of chronic kidney disease Hemoglobin around baseline Anemia panel showed iron 25, sats 9, Vit B12 547, folate 7.3 Granddaughter stated she has had anemia for a very long time, not resolved with iron supplements as she has tried it in the past Daily CBC  Paroxysmal A. Fib/hypertension Currently rate controlled Continue Lopressor, restart Pradaxa  Chronic diastolic HF/pulmonary hypertension BNP  482 Chest x-ray with pulmonary vascular congestion Echo done in 2018 showed EF of 65 to 70%, PA peak pressure 48 mmHg, moderate sized loculated pericardial effusion Repeat echo done showed EF of 55 to 60%, moderately elevated pulmonary artery systolic pressure which is about 57 mmHg, severe tricuspid valve regurgitation, right ventricular systolic function is moderately reduced as it is severely enlarged Continue p.o. Lasix Monitor closely  GERD Continue PPI  Chronic venous insufficiency Continue Kerlex, Ace wrap for light compression        Malnutrition Type:      Malnutrition Characteristics:      Nutrition Interventions:       Estimated body mass index is 29.87 kg/m as calculated from the following:   Height as of this encounter: 5\' 4"  (1.626 m).   Weight as of this encounter: 78.9 kg.     Code Status: DNR  Family Communication: Discussed with granddaughter on 09/23/2019, all questions answered  Disposition Plan: Patient lives alone, came from home.  Refusing SNF placement, plan to DC home with [facility].  Daughter unavailable this weekend to pick her up.    Consultants:  None  Procedures:  None  Antimicrobials:  IV Ancef  DVT prophylaxis: Pradaxa   Objective: Vitals:   09/22/19 2125 09/23/19 0615 09/23/19 0949 09/23/19 1341  BP: (!) 119/49 (!) 91/45 (!) 103/51 112/62  Pulse: (!) 104 78 74 82  Resp: 20 20  19   Temp: 98 F (36.7 C) 98.7 F (37.1 C)  98.1 F (36.7 C)  TempSrc:  Oral  Oral  SpO2: 94% 94%  98%  Weight:      Height:        Intake/Output Summary (Last  24 hours) at 09/23/2019 1729 Last data filed at 09/23/2019 1400 Gross per 24 hour  Intake 1200 ml  Output --  Net 1200 ml   Filed Weights   09/18/19 2115  Weight: 78.9 kg    Exam:  General: NAD, alert, oriented x3  Cardiovascular: S1, S2 present  Respiratory: CTAB  Abdomen: Soft, nontender, nondistended, bowel sounds present  Musculoskeletal: No bilateral pedal  edema noted, resolving erythema noted, currently wrapped in Ace wrap  Skin:  Noted bruises  Psychiatry: Normal mood    Data Reviewed: CBC: Recent Labs  Lab 09/18/19 2224 09/19/19 0643 09/20/19 0544 09/21/19 0556 09/22/19 0527  WBC 8.2 7.2 8.1 7.9 8.1  NEUTROABS 5.7  --  6.0 5.3 5.0  HGB 9.6* 9.7* 10.5* 9.7* 10.0*  HCT 33.2* 32.9* 36.7 33.2* 34.3*  MCV 68.5* 68.1* 68.6* 67.9* 67.7*  PLT 220 205 208 180 326   Basic Metabolic Panel: Recent Labs  Lab 09/19/19 0643 09/20/19 0544 09/21/19 0556 09/22/19 0527 09/23/19 0530  NA 138 140 141 140 141  K 4.5 4.8 4.6 4.6 4.6  CL 107 109 111 108 107  CO2 22 22 23 26 25   GLUCOSE 92 117* 100* 102* 106*  BUN 55* 50* 44* 45* 45*  CREATININE 2.12* 1.98* 1.82* 1.86* 1.87*  CALCIUM 8.6* 9.0 8.6* 8.6* 8.5*   GFR: Estimated Creatinine Clearance: 17.1 mL/min (A) (by C-G formula based on SCr of 1.87 mg/dL (H)). Liver Function Tests: Recent Labs  Lab 09/18/19 2224  AST 20  ALT 12  ALKPHOS 77  BILITOT 0.9  PROT 6.8  ALBUMIN 3.5   No results for input(s): LIPASE, AMYLASE in the last 168 hours. No results for input(s): AMMONIA in the last 168 hours. Coagulation Profile: No results for input(s): INR, PROTIME in the last 168 hours. Cardiac Enzymes: No results for input(s): CKTOTAL, CKMB, CKMBINDEX, TROPONINI in the last 168 hours. BNP (last 3 results) No results for input(s): PROBNP in the last 8760 hours. HbA1C: No results for input(s): HGBA1C in the last 72 hours. CBG: No results for input(s): GLUCAP in the last 168 hours. Lipid Profile: No results for input(s): CHOL, HDL, LDLCALC, TRIG, CHOLHDL, LDLDIRECT in the last 72 hours. Thyroid Function Tests: No results for input(s): TSH, T4TOTAL, FREET4, T3FREE, THYROIDAB in the last 72 hours. Anemia Panel: Recent Labs    09/22/19 0527  VITAMINB12 547  FOLATE 7.3  FERRITIN 64  TIBC 265  IRON 25*   Urine analysis:    Component Value Date/Time   COLORURINE YELLOW  09/19/2019 0843   APPEARANCEUR CLEAR 09/19/2019 0843   LABSPEC 1.019 09/19/2019 0843   PHURINE 6.0 09/19/2019 0843   GLUCOSEU NEGATIVE 09/19/2019 0843   HGBUR NEGATIVE 09/19/2019 0843   BILIRUBINUR NEGATIVE 09/19/2019 0843   KETONESUR NEGATIVE 09/19/2019 0843   PROTEINUR NEGATIVE 09/19/2019 0843   UROBILINOGEN 0.2 07/10/2012 1122   NITRITE NEGATIVE 09/19/2019 0843   LEUKOCYTESUR TRACE (A) 09/19/2019 0843   Sepsis Labs: @LABRCNTIP (procalcitonin:4,lacticidven:4)  ) Recent Results (from the past 240 hour(s))  SARS Coronavirus 2 by RT PCR     Status: None   Collection Time: 09/18/19 11:19 PM  Result Value Ref Range Status   SARS Coronavirus 2 NEGATIVE NEGATIVE Final    Comment: (NOTE) Result indicates the ABSENCE of SARS-CoV-2 RNA in the patient specimen.  The lowest concentration of SARS-CoV-2 viral copies this assay can detect in nasopharyngeal swab specimens is 500 copies / mL.  A negative result does not preclude SARS-CoV-2 infection and should not  be used as the sole basis for patient management decisions. A negative result may occur with improper specimen collection / handling, submission of a specimen other than nasopharyngeal swab, presence of viral mutation(s) within the areas targeted by this assay, and inadequate number of viral copies (<500 copies / mL) present.  Negative results must be combined with clinical observations, patient history, and epidemiological information.  The expected result is NEGATIVE.  Patient Fact Sheet:  BlogSelections.co.uk   Provider Fact Sheet:  https://lucas.com/   This test is not yet approved or cleared by the [place] FDA and  has been authorized for  detection and/or diagnosis of SARS-CoV-2 by FDA under an Emergency Use Authorization (EUA).  This EUA will remain in effect (meaning this test can be used) for the duration of  the COVID-19 declaration under Section 564(b)(1) of the Act,  21 U.S.C. section 360bbb-3(b)(1), unless the authorization is terminated or revoked sooner Performed at [facility], [place] [address], [place], [place] [address]   MRSA PCR Screening     Status: None   Collection Time: 09/19/19  2:42 PM   Specimen: Nasal Mucosa; Nasopharyngeal  Result Value Ref Range Status   MRSA by PCR NEGATIVE NEGATIVE Final    Comment:        The GeneXpert MRSA Assay (FDA approved for NASAL specimens only), is one component of a comprehensive MRSA colonization surveillance program. It is not intended to diagnose MRSA infection nor to guide or monitor treatment for MRSA infections. Performed at [facility], [place] [address], [place], [place] [address]       Studies: No results found.  Scheduled Meds: . acidophilus  1 capsule Oral q AM  . dabigatran  75 mg Oral Q12H  . furosemide  40 mg Oral Daily  . metoprolol tartrate  25 mg Oral BID  . pantoprazole  40 mg Oral BID    Continuous Infusions: .  ceFAZolin (ANCEF) IV 1 g (09/23/19 0944)     LOS: 4 days     Alma Friendly, MD [facility]  If 7PM-7AM, please contact night-coverage [email or web address] 09/23/2019, 5:29 PM

## 2019-09-23 NOTE — Progress Notes (Signed)
Wound care performed to bilateral lower extremities per order. Patient has no complaints of pain. Will continue with plan of care.

## 2019-09-23 NOTE — TOC Progression Note (Signed)
Transition of Care [facility]) - Progression Note    Patient Details  Name: Lori [name] MRN: [id number] Date of Birth: 02/20/1919  Transition of Care [facility]) CM/SW [place], [place] Phone Number: 09/23/2019, 3:20 PM  Clinical Narrative:   Called granddaughter to let her know about bed offers.  She informed me that [name] is now refusing to go to SNF, and that she will be discharged home with [facility] support, according to the Dr.  Donella Stade will continue to follow during the course of hospitalization.     Expected Discharge Plan: [place] Barriers to Discharge: No Barriers Identified  Expected Discharge Plan and Services Expected Discharge Plan: [place]   Discharge Planning Services: CM Consult   Living arrangements for the past 2 months: Single Family Home                                       Social Determinants of Health (SDOH) Interventions    Readmission Risk Interventions No flowsheet data found.

## 2019-09-24 LAB — BASIC METABOLIC PANEL
Anion gap: 8 (ref 5–15)
BUN: 41 mg/dL — ABNORMAL HIGH (ref 8–23)
CO2: 26 mmol/L (ref 22–32)
Calcium: 8.4 mg/dL — ABNORMAL LOW (ref 8.9–10.3)
Chloride: 105 mmol/L (ref 98–111)
Creatinine, Ser: 1.87 mg/dL — ABNORMAL HIGH (ref 0.44–1.00)
GFR calc Af Amer: 25 mL/min — ABNORMAL LOW (ref >60)
GFR calc non Af Amer: 22 mL/min — ABNORMAL LOW (ref >60)
Glucose, Bld: 97 mg/dL (ref 70–99)
Potassium: 4.6 mmol/L (ref 3.5–5.1)
Sodium: 139 mmol/L (ref 135–145)

## 2019-09-24 MED ORDER — FUROSEMIDE 20 MG PO TABS
20.0000 mg | ORAL_TABLET | Freq: Every day | ORAL | Status: DC
  Administered 2019-09-25: 20 mg via ORAL
  Filled 2019-09-24: qty 1

## 2019-09-24 NOTE — Progress Notes (Signed)
PROGRESS NOTE  Lori [name] NFA:[id number] DOB: Mar 12, 1919 DOA: 09/18/2019 PCP: Leanna Battles, MD  HPI/Recap of past 24 hours: HPI from Dr Lori [name]  86 year old female who presents with complaints of lower extremity swelling and cellulitis.  She has seen her PCP and was prescribed antibiotics doxycycline, then clindamycin.  This started approximately 3 weeks ago. Pt developed worsening swelling and redness.  She decided to come to the ER. Pt has chronic lower extremity lymphedema and states she gets cellulitis approximately once annually. History provided by the patient who is alert and oriented.    Today, patient denies any new complaints.  Very eager to be discharged home.    Assessment/Plan: Active Problems:   Asthma   Atrial fibrillation (HCC)   Essential hypertension   Pulmonary HTN (HCC)   Chronic venous insufficiency   CKD (chronic kidney disease), stage IV (HCC)   Cellulitis   Chronic diastolic CHF (congestive heart failure) (HCC)   Bilateral lower extremity cellulitis History of chronic venous insufficiency Currently afebrile, with no leukocytosis Continue IV Ancef Continue daily wound care, with Kerlex, Ace wrap for light compression  AKI on CKD stage IV Improved Creatinine likely around baseline now Daily BMP  Anemia of chronic kidney disease Hemoglobin around baseline Anemia panel showed iron 25, sats 9, Vit B12 547, folate 7.3 Granddaughter stated she has had anemia for a very long time, not resolved with iron supplements as she has tried it in the past Daily CBC  Paroxysmal A. Fib/hypertension Currently rate controlled Continue Lopressor, restart Pradaxa  Chronic diastolic HF/pulmonary hypertension BNP 482 Chest x-ray with pulmonary vascular congestion Echo done in 2018 showed EF of 65 to 70%, PA peak pressure 48 mmHg, moderate sized loculated pericardial effusion Repeat echo done showed EF of 55 to 60%, moderately elevated pulmonary artery  systolic pressure which is about 57 mmHg, severe tricuspid valve regurgitation, right ventricular systolic function is moderately reduced as it is severely enlarged Continue p.o. Lasix Monitor closely  GERD Continue PPI  Chronic venous insufficiency Continue Kerlex, Ace wrap for light compression        Malnutrition Type:      Malnutrition Characteristics:      Nutrition Interventions:       Estimated body mass index is 29.87 kg/m as calculated from the following:   Height as of this encounter: 5\' 4"  (1.626 m).   Weight as of this encounter: 78.9 kg.     Code Status: DNR  Family Communication: Discussed with granddaughter on 09/24/2019, all questions answered  Disposition Plan: Patient lives alone, came from home.  Refusing SNF placement, plan to DC home with [facility].  Daughter unavailable this weekend to pick her up, plan for d/c on 09/25/19    Consultants:  None  Procedures:  None  Antimicrobials:  IV Ancef  DVT prophylaxis: Pradaxa   Objective: Vitals:   09/23/19 1341 09/23/19 2053 09/24/19 0558 09/24/19 1357  BP: 112/62 117/65 (!) 119/58 (!) 99/45  Pulse: 82 90 83 63  Resp: 19 20 20 18   Temp: 98.1 F (36.7 C) 98 F (36.7 C) 98 F (36.7 C) (!) 97.5 F (36.4 C)  TempSrc: Oral Oral Oral Oral  SpO2: 98% 95% 91% 97%  Weight:      Height:        Intake/Output Summary (Last 24 hours) at 09/24/2019 1518 Last data filed at 09/24/2019 1219 Gross per 24 hour  Intake 510 ml  Output --  Net 510 ml   [employer]  09/18/19 2115  Weight: 78.9 kg    Exam:  General: NAD, alert, oriented x3  Cardiovascular: S1, S2 present  Respiratory: CTAB  Abdomen: Soft, nontender, nondistended, bowel sounds present  Musculoskeletal: No bilateral pedal edema noted, resolving erythema noted, currently wrapped in Ace wrap  Skin:  Noted bruises  Psychiatry: Normal mood, very pleasant    Data Reviewed: CBC: Recent Labs  Lab 09/18/19 2224  09/19/19 0643 09/20/19 0544 09/21/19 0556 09/22/19 0527  WBC 8.2 7.2 8.1 7.9 8.1  NEUTROABS 5.7  --  6.0 5.3 5.0  HGB 9.6* 9.7* 10.5* 9.7* 10.0*  HCT 33.2* 32.9* 36.7 33.2* 34.3*  MCV 68.5* 68.1* 68.6* 67.9* 67.7*  PLT 220 205 208 180 161   Basic Metabolic Panel: Recent Labs  Lab 09/20/19 0544 09/21/19 0556 09/22/19 0527 09/23/19 0530 09/24/19 0542  NA 140 141 140 141 139  K 4.8 4.6 4.6 4.6 4.6  CL 109 111 108 107 105  CO2 22 23 26 25 26   GLUCOSE 117* 100* 102* 106* 97  BUN 50* 44* 45* 45* 41*  CREATININE 1.98* 1.82* 1.86* 1.87* 1.87*  CALCIUM 9.0 8.6* 8.6* 8.5* 8.4*   GFR: Estimated Creatinine Clearance: 17.1 mL/min (A) (by C-G formula based on SCr of 1.87 mg/dL (H)). Liver Function Tests: Recent Labs  Lab 09/18/19 2224  AST 20  ALT 12  ALKPHOS 77  BILITOT 0.9  PROT 6.8  ALBUMIN 3.5   No results for input(s): LIPASE, AMYLASE in the last 168 hours. No results for input(s): AMMONIA in the last 168 hours. Coagulation Profile: No results for input(s): INR, PROTIME in the last 168 hours. Cardiac Enzymes: No results for input(s): CKTOTAL, CKMB, CKMBINDEX, TROPONINI in the last 168 hours. BNP (last 3 results) No results for input(s): PROBNP in the last 8760 hours. HbA1C: No results for input(s): HGBA1C in the last 72 hours. CBG: No results for input(s): GLUCAP in the last 168 hours. Lipid Profile: No results for input(s): CHOL, HDL, LDLCALC, TRIG, CHOLHDL, LDLDIRECT in the last 72 hours. Thyroid Function Tests: No results for input(s): TSH, T4TOTAL, FREET4, T3FREE, THYROIDAB in the last 72 hours. Anemia Panel: Recent Labs    09/22/19 0527  VITAMINB12 547  FOLATE 7.3  FERRITIN 64  TIBC 265  IRON 25*   Urine analysis:    Component Value Date/Time   COLORURINE YELLOW 09/19/2019 0843   APPEARANCEUR CLEAR 09/19/2019 0843   LABSPEC 1.019 09/19/2019 0843   PHURINE 6.0 09/19/2019 0843   GLUCOSEU NEGATIVE 09/19/2019 0843   HGBUR NEGATIVE 09/19/2019 0843    BILIRUBINUR NEGATIVE 09/19/2019 0843   KETONESUR NEGATIVE 09/19/2019 0843   PROTEINUR NEGATIVE 09/19/2019 0843   UROBILINOGEN 0.2 07/10/2012 1122   NITRITE NEGATIVE 09/19/2019 0843   LEUKOCYTESUR TRACE (A) 09/19/2019 0843   Sepsis Labs: @LABRCNTIP (procalcitonin:4,lacticidven:4)  ) Recent Results (from the past 240 hour(s))  SARS Coronavirus 2 by RT PCR     Status: None   Collection Time: 09/18/19 11:19 PM  Result Value Ref Range Status   SARS Coronavirus 2 NEGATIVE NEGATIVE Final    Comment: (NOTE) Result indicates the ABSENCE of SARS-CoV-2 RNA in the patient specimen.  The lowest concentration of SARS-CoV-2 viral copies this assay can detect in nasopharyngeal swab specimens is 500 copies / mL.  A negative result does not preclude SARS-CoV-2 infection and should not be used as the sole basis for patient management decisions. A negative result may occur with improper specimen collection / handling, submission of a specimen other than nasopharyngeal swab, presence  of viral mutation(s) within the areas targeted by this assay, and inadequate number of viral copies (<500 copies / mL) present.  Negative results must be combined with clinical observations, patient history, and epidemiological information.  The expected result is NEGATIVE.  Patient Fact Sheet:  BlogSelections.co.uk   Provider Fact Sheet:  https://lucas.com/   This test is not yet approved or cleared by the [place] FDA and  has been authorized for  detection and/or diagnosis of SARS-CoV-2 by FDA under an Emergency Use Authorization (EUA).  This EUA will remain in effect (meaning this test can be used) for the duration of  the COVID-19 declaration under Section 564(b)(1) of the Act, 21 U.S.C. section 360bbb-3(b)(1), unless the authorization is terminated or revoked sooner Performed at [facility], [place] [address], [place], [place] [address]   MRSA PCR Screening      Status: None   Collection Time: 09/19/19  2:42 PM   Specimen: Nasal Mucosa; Nasopharyngeal  Result Value Ref Range Status   MRSA by PCR NEGATIVE NEGATIVE Final    Comment:        The GeneXpert MRSA Assay (FDA approved for NASAL specimens only), is one component of a comprehensive MRSA colonization surveillance program. It is not intended to diagnose MRSA infection nor to guide or monitor treatment for MRSA infections. Performed at [facility], [place] [address] [place], [place] [address]       Studies: No results found.  Scheduled Meds: . acidophilus  1 capsule Oral q AM  . dabigatran  75 mg Oral Q12H  . [START ON 09/25/2019] furosemide  20 mg Oral Daily  . metoprolol tartrate  25 mg Oral BID  . pantoprazole  40 mg Oral BID    Continuous Infusions: .  ceFAZolin (ANCEF) IV 1 g (09/24/19 7681)     LOS: 5 days     Alma Friendly, MD [facility]  If 7PM-7AM, please contact night-coverage [email or web address] 09/24/2019, 3:18 PM

## 2019-09-25 MED ORDER — CEPHALEXIN 500 MG PO CAPS: 500.0000 mg | ORAL_CAPSULE | Freq: Once | ORAL | 0 refills | Status: AC

## 2019-09-25 NOTE — Discharge Summary (Signed)
Discharge Summary  Lori [name] UXL:[id number] DOB: June 16, 1919  PCP: Leanna Battles, MD  Admit date: 09/18/2019 Discharge date: 09/25/2019  Time spent: 35 mins  Recommendations for Outpatient Follow-up:  1. Follow-up with PCP in 1 week  Discharge Diagnoses:  Active Hospital Problems   Diagnosis Date Noted  . Cellulitis 02/26/2018  . Chronic diastolic CHF (congestive heart failure) ([place]) 02/26/2018  . Chronic venous insufficiency 08/17/2017  . CKD (chronic kidney disease), stage IV ([place]) 08/17/2017  . Pulmonary HTN ([place]) 11/04/2016  . Essential hypertension 03/31/2012  . Atrial fibrillation ([place]) 07/29/2011  . Asthma 06/10/2010    Resolved Hospital Problems  No resolved problems to display.    Discharge Condition: Stable  Diet recommendation: As tolerated  Vitals:   09/24/19 2117 09/25/19 0628  BP: 110/60 132/66  Pulse: 79 87  Resp: 20 20  Temp: 97.6 F (36.4 C) 97.9 F (36.6 C)  SpO2: 96% 94%    History of present illness:  86 year old female who presents with complaints of lower extremity swelling and cellulitis. She has seen her PCP and was prescribedantibiotics doxycycline, thenclindamycin. This started approximately 3 weeks ago. Pt developed worsening swelling and redness.She decided to come to the ER. Pt has chronic lower extremity lymphedema and states she gets cellulitis approximately once annually. History provided by the patient who is alert and oriented.    Today, patient denies any new complaints.  Very eager to be discharged home.  Discussed extensively with granddaughter on 09/24/2019.  Patient refusing SNF, discharged home with home health PT, RN.  Patient has a Building services engineer.  DME 3 1 ordered.     Hospital Course:  Active Problems:   Asthma   Atrial fibrillation (HCC)   Essential hypertension   Pulmonary HTN (HCC)   Chronic venous insufficiency   CKD (chronic kidney disease), stage IV (HCC)   Cellulitis   Chronic diastolic CHF  (congestive heart failure) (HCC)   Bilateral lower extremity cellulitis History of chronic venous insufficiency Currently afebrile, with no leukocytosis S/P IV Ancef-->PO Keflex to complete 7 days of antibiotics Continue daily wound care, with Kerlex, Ace wrap for light compression [facility] RN) Follow-up with PCP in 1 week  AKI on CKD stage IV Improved Creatinine around baseline Follow-up with PCP in 1 week  Anemia of chronic kidney disease Hemoglobin around baseline Anemia panel showed iron 25, sats 9, Vit B12 547, folate 7.3 Granddaughter stated she has had anemia for a very long time, not resolved with iron supplements as she has tried it in the past Follow-up with PCP in 1 week  Paroxysmal A. Fib/hypertension Currently rate controlled Continue Lopressor, Pradaxa  Chronic diastolic HF/pulmonary hypertension Currently denies any shortness of breath, saturating well on room air BNP 482 Chest x-ray with pulmonary vascular congestion Echo done in 2018 showed EF of 65 to 70%, PA peak pressure 48 mmHg, moderate sized loculated pericardial effusion Repeat echo done showed EF of 55 to 60%, moderately elevated pulmonary artery systolic pressure which is about 57 mmHg, severe tricuspid valve regurgitation, right ventricular systolic function is moderately reduced as it is severely enlarged Continue p.o. Lasix Follow-up with PCP  GERD Continue PPI  Chronic venous insufficiency Continue Kerlex, Ace wrap for light compression        Malnutrition Type:      Malnutrition Characteristics:      Nutrition Interventions:      Estimated body mass index is 29.87 kg/m as calculated from the following:   Height as of this encounter: 5'  4" (1.626 m).   Weight as of this encounter: 78.9 kg.    Procedures:  None  Consultations:  None  Discharge Exam: BP 132/66 (BP Location: Left Arm)   Pulse 87   Temp 97.9 F (36.6 C) (Oral)   Resp 20   Ht 5\' 4"  (1.626 m)   Wt  78.9 kg   SpO2 94%   BMI 29.87 kg/m   General: NAD Cardiovascular: S1, S2 present Respiratory: Diminished breath sounds bilaterally  Discharge Instructions You were cared for by a hospitalist during your hospital stay. If you have any questions about your discharge medications or the care you received while you were in the hospital after you are discharged, you can call the unit and asked to speak with the hospitalist on call if the hospitalist that took care of you is not available. Once you are discharged, your primary care physician will handle any further medical issues. Please note that NO REFILLS for any discharge medications will be authorized once you are discharged, as it is imperative that you return to your primary care physician (or establish a relationship with a primary care physician if you do not have one) for your aftercare needs so that they can reassess your need for medications and monitor your lab values.  Discharge Instructions    Diet - low sodium heart healthy   Complete by: As directed    Increase activity slowly   Complete by: As directed      Allergies as of 09/25/2019      Reactions   Dust Mite Extract Shortness Of Breath, Itching   Mold Extract [trichophyton Mentagrophyte] Shortness Of Breath, Itching, Cough   VERY ALLERGIC/EXPOSURE RESULTS IN TREMENDOUS COUGHING (just one symptom)   Latex Itching   And certain fabrics   Tape Other (See Comments)   SKIN IS VERY THIN AND WILL TEAR AND BRUISE EASILY!! PLEASE USE COBAN WRAP-      Medication List    STOP taking these medications   loratadine 10 MG tablet Commonly known as: CLARITIN     TAKE these medications   ALIGN PO Take 1 capsule by mouth in the morning.   cephALEXin 500 MG capsule Commonly known as: KEFLEX Take 1 capsule (500 mg total) by mouth once for 1 dose.   dabigatran 75 MG Caps capsule Commonly known as: Pradaxa TAKE 1 CAPSULE EVERY 12 HOURS What changed:   how much to take  how  to take this  when to take this   furosemide 20 MG tablet Commonly known as: LASIX Take 40mg  PO daily, take an extra 20mg  Pill if your weight is over 2 pounds in 24hrs or your notice extra fluid in your legs.   metoprolol tartrate 25 MG tablet Commonly known as: LOPRESSOR Take 1 tablet (25 mg total) by mouth 2 (two) times daily.   omeprazole 40 MG capsule Commonly known as: PRILOSEC Take 40 mg by mouth 2 (two) times daily.   spironolactone 25 MG tablet Commonly known as: ALDACTONE Take 25 mg by mouth every Monday, Wednesday, and Friday.            Durable Medical Equipment  (From admission, onward)         Start     Ordered   09/25/19 1136  For home use only DME 3 n 1  Once     09/25/19 1135         Allergies  Allergen Reactions  . Dust Mite Extract Shortness Of Breath and  Itching  . Mold Extract [Trichophyton Mentagrophyte] Shortness Of Breath, Itching and Cough    VERY ALLERGIC/EXPOSURE RESULTS IN TREMENDOUS COUGHING (just one symptom)  . Latex Itching    And certain fabrics  . Tape Other (See Comments)    SKIN IS VERY THIN AND WILL TEAR AND BRUISE EASILY!! PLEASE USE COBAN WRAP-   Follow-up Information    Leanna Battles, MD. Schedule an appointment as soon as possible for a visit in 1 week(s).   Specialty: Internal Medicine Contact information: [address] [place] [address] [phone]            The results of significant diagnostics from this hospitalization (including imaging, microbiology, ancillary and laboratory) are listed below for reference.    Significant Diagnostic Studies: DG Chest Port 1 View  Result Date: 09/20/2019 CLINICAL DATA:  BILATERAL lower extremity swelling and redness, shortness of breath, history CHF, atrial fibrillation, asthma, hypertension, pulmonary hypertension, former smoker EXAM: PORTABLE CHEST 1 VIEW COMPARISON:  Portable exam 1145 hours compared to 02/25/2018 FINDINGS: Enlargement of cardiac silhouette  with pulmonary vascular congestion. Atherosclerotic calcification aorta. Asymmetric pulmonary infiltrates RIGHT greater than LEFT most consistent with pulmonary edema and CHF. Bibasilar pleural effusions and atelectasis greater on RIGHT. No pneumothorax. Bones demineralized. IMPRESSION: CHF with bibasilar effusions and atelectasis slightly greater on RIGHT. Electronically Signed   By: Lavonia Dana M.D.   On: 09/20/2019 12:15   VAS [place] ABI WITH/WO TBI  Result Date: 09/19/2019 LOWER EXTREMITY DOPPLER STUDY Indications: Peripheral artery disease.  Comparison Study: No prior studies. Performing Technologist: Carlos Levering RVT  Examination Guidelines: A complete evaluation includes at minimum, Doppler waveform signals and systolic blood pressure reading at the level of bilateral brachial, anterior tibial, and posterior tibial arteries, when vessel segments are accessible. Bilateral testing is considered an integral part of a complete examination. Photoelectric Plethysmograph (PPG) waveforms and toe systolic pressure readings are included as required and additional duplex testing as needed. Limited examinations for reoccurring indications may be performed as noted.  ABI Findings: +---------+------------------+-----+---------+--------+ Right    Rt Pressure (mmHg)IndexWaveform Comment  +---------+------------------+-----+---------+--------+ Brachial 134                    triphasic         +---------+------------------+-----+---------+--------+ PTA      155               1.16 biphasic          +---------+------------------+-----+---------+--------+ DP       140               1.04 biphasic          +---------+------------------+-----+---------+--------+ Great Toe81                0.60                   +---------+------------------+-----+---------+--------+ +---------+------------------+-----+---------+-------+ Left     Lt Pressure (mmHg)IndexWaveform Comment  +---------+------------------+-----+---------+-------+ Brachial 134                    triphasic        +---------+------------------+-----+---------+-------+ PTA      244               1.82 biphasic         +---------+------------------+-----+---------+-------+ DP       243               1.81 biphasic         +---------+------------------+-----+---------+-------+  Great Toe73                0.54                  +---------+------------------+-----+---------+-------+ +-------+-----------+-----------+------------+------------+ ABI/TBIToday's ABIToday's TBIPrevious ABIPrevious TBI +-------+-----------+-----------+------------+------------+ Right  1.16       0.6                                 +-------+-----------+-----------+------------+------------+ Left   1.82       0.54                                +-------+-----------+-----------+------------+------------+  Summary: Right: Resting right ankle-brachial index is within normal range. No evidence of significant right lower extremity arterial disease. The right toe-brachial index is abnormal. Left: Resting left ankle-brachial index indicates noncompressible left lower extremity arteries. The left toe-brachial index is abnormal.  *See table(s) above for measurements and observations.  Electronically signed by Harold Barban MD on 09/19/2019 at 9:43:24 PM.   Final    ECHOCARDIOGRAM COMPLETE  Result Date: 09/21/2019    ECHOCARDIOGRAM REPORT   Patient Name:   AIREAL SLATER Date of Exam: 09/21/2019 Medical Rec #:  [id number]       Height:       64.0 in Accession #:    [id number]      Weight:       174.0 lb Date of Birth:  07/29/1918      BSA:          1.844 m Patient Age:    61 years        BP:           127/59 mmHg Patient Gender: F               HR:           56 bpm. Exam Location:  Inpatient Procedure: 2D Echo Indications:    Congestive Heart Failure 428.0 / I50.9  History:        Patient has prior history of Echocardiogram  examinations, most                 recent 11/06/2016. Risk Factors:Hypertension and Dyslipidemia.                 Lower extremity swelling and cellulitis.  Sonographer:    Darlina Sicilian RDCS Referring Phys: [id number] [place]  1. Left ventricular ejection fraction, by estimation, is 55 to 60%. The left ventricle has normal function. The left ventricle has no regional wall motion abnormalities. There is mild concentric left ventricular hypertrophy. Left ventricular diastolic function could not be evaluated. There is the interventricular septum is flattened in diastole ('D' shaped left ventricle), consistent with right ventricular volume overload.  2. Right ventricular systolic function is moderately reduced. The right ventricular size is severely enlarged. There is moderately elevated pulmonary artery systolic pressure. The estimated right ventricular systolic pressure is 17.4 mmHg.  3. Left atrial size was severely dilated.  4. Right atrial size was severely dilated.  5. The mitral valve is degenerative. Mild mitral valve regurgitation. No evidence of mitral stenosis.  6. Tricuspid valve regurgitation is severe.  7. The aortic valve is tricuspid. Aortic valve regurgitation is not visualized. Mild aortic valve sclerosis is present, with no evidence of aortic valve stenosis.  8. The inferior vena cava is  dilated in size with <50% respiratory variability, suggesting right atrial pressure of 15 mmHg. Comparison(s): A prior study was performed on 11/06/2016. Changes from prior study are noted. RV now severely dilated with moderately reduced function. Severe TR is now present. LVEF normal 55-60%. FINDINGS  Left Ventricle: Left ventricular ejection fraction, by estimation, is 55 to 60%. The left ventricle has normal function. The left ventricle has no regional wall motion abnormalities. The left ventricular internal cavity size was normal in size. There is  mild concentric left ventricular hypertrophy.  The interventricular septum is flattened in diastole ('D' shaped left ventricle), consistent with right ventricular volume overload. Left ventricular diastolic function could not be evaluated due to atrial fibrillation. Left ventricular diastolic function could not be evaluated. Right Ventricle: The right ventricular size is severely enlarged. No increase in right ventricular wall thickness. Right ventricular systolic function is moderately reduced. There is moderately elevated pulmonary artery systolic pressure. The tricuspid regurgitant velocity is 3.26 m/s, and with an assumed right atrial pressure of 15 mmHg, the estimated right ventricular systolic pressure is 17.6 mmHg. Left Atrium: Left atrial size was severely dilated. Right Atrium: Right atrial size was severely dilated. Pericardium: Trivial pericardial effusion is present. The pericardial effusion is posterior to the left ventricle. Presence of pericardial fat pad. Mitral Valve: The mitral valve is degenerative in appearance. Mild to moderate mitral annular calcification. Mild mitral valve regurgitation. No evidence of mitral valve stenosis. Tricuspid Valve: The tricuspid valve is grossly normal. Tricuspid valve regurgitation is severe. No evidence of tricuspid stenosis. The flow in the hepatic veins is reversed during ventricular systole. Aortic Valve: The aortic valve is tricuspid. Aortic valve regurgitation is not visualized. Mild aortic valve sclerosis is present, with no evidence of aortic valve stenosis. Pulmonic Valve: The pulmonic valve was grossly normal. Pulmonic valve regurgitation is trivial. No evidence of pulmonic stenosis. Aorta: The aortic root and ascending aorta are structurally normal, with no evidence of dilitation. Venous: The inferior vena cava is dilated in size with less than 50% respiratory variability, suggesting right atrial pressure of 15 mmHg. IAS/Shunts: No atrial level shunt detected by color flow Doppler. Additional  Comments: There is a small pleural effusion in the left lateral region.  LEFT VENTRICLE PLAX 2D LVIDd:         3.60 cm LVIDs:         2.60 cm LV PW:         1.20 cm LV IVS:        1.40 cm LVOT diam:     1.80 cm LV SV:         40 LV SV Index:   22 LVOT Area:     2.54 cm  LV Volumes (MOD) LV vol d, MOD A2C: 76.2 ml LV vol d, MOD A4C: 62.7 ml LV vol s, MOD A2C: 31.1 ml LV vol s, MOD A4C: 26.1 ml LV SV MOD A2C:     45.1 ml LV SV MOD A4C:     62.7 ml LV SV MOD BP:      39.2 ml LEFT ATRIUM             Index LA diam:        4.30 cm 2.33 cm/m LA Vol (A2C):   97.1 ml 52.66 ml/m LA Vol (A4C):   90.3 ml 48.97 ml/m LA Biplane Vol: 92.0 ml 49.89 ml/m  AORTIC VALVE LVOT Vmax:   80.80 cm/s LVOT Vmean:  56.400 cm/s LVOT VTI:    0.156  m  AORTA Ao Root diam: 2.90 cm Ao Asc diam:  2.70 cm MITRAL VALVE                TRICUSPID VALVE MV Area (PHT): 4.32 cm     TR Peak grad:   42.5 mmHg MV Decel Time: 176 msec     TR Vmax:        326.00 cm/s MV E velocity: 134.50 cm/s                             SHUNTS                             Systemic VTI:  0.16 m                             Systemic Diam: 1.80 cm Eleonore Chiquito MD Electronically signed by Eleonore Chiquito MD Signature Date/Time: 09/21/2019/4:08:16 PM    Final     Microbiology: Recent Results (from the past 240 hour(s))  SARS Coronavirus 2 by RT PCR     Status: None   Collection Time: 09/18/19 11:19 PM  Result Value Ref Range Status   SARS Coronavirus 2 NEGATIVE NEGATIVE Final    Comment: (NOTE) Result indicates the ABSENCE of SARS-CoV-2 RNA in the patient specimen.  The lowest concentration of SARS-CoV-2 viral copies this assay can detect in nasopharyngeal swab specimens is 500 copies / mL.  A negative result does not preclude SARS-CoV-2 infection and should not be used as the sole basis for patient management decisions. A negative result may occur with improper specimen collection / handling, submission of a specimen other than nasopharyngeal swab, presence of  viral mutation(s) within the areas targeted by this assay, and inadequate number of viral copies (<500 copies / mL) present.  Negative results must be combined with clinical observations, patient history, and epidemiological information.  The expected result is NEGATIVE.  Patient Fact Sheet:  BlogSelections.co.uk   Provider Fact Sheet:  https://lucas.com/   This test is not yet approved or cleared by the [place] FDA and  has been authorized for  detection and/or diagnosis of SARS-CoV-2 by FDA under an Emergency Use Authorization (EUA).  This EUA will remain in effect (meaning this test can be used) for the duration of  the COVID-19 declaration under Section 564(b)(1) of the Act, 21 U.S.C. section 360bbb-3(b)(1), unless the authorization is terminated or revoked sooner Performed at [facility], [place] [address] [place], [place] [address]   MRSA PCR Screening     Status: None   Collection Time: 09/19/19  2:42 PM   Specimen: Nasal Mucosa; Nasopharyngeal  Result Value Ref Range Status   MRSA by PCR NEGATIVE NEGATIVE Final    Comment:        The GeneXpert MRSA Assay (FDA approved for NASAL specimens only), is one component of a comprehensive MRSA colonization surveillance program. It is not intended to diagnose MRSA infection nor to guide or monitor treatment for MRSA infections. Performed at [facility], [place] [address], [place], [place] [address]      Labs: Basic Metabolic Panel: Recent Labs  Lab 09/20/19 0544 09/21/19 0556 09/22/19 0527 09/23/19 0530 09/24/19 0542  NA 140 141 140 141 139  K 4.8 4.6 4.6 4.6 4.6  CL 109 111 108 107 105  CO2 22 23  26 25 26   GLUCOSE 117* 100* 102* 106* 97  BUN 50* 44* 45* 45* 41*  CREATININE 1.98* 1.82* 1.86* 1.87* 1.87*  CALCIUM 9.0 8.6* 8.6* 8.5* 8.4*   Liver Function Tests: Recent Labs  Lab 09/18/19 2224  AST 20  ALT 12  ALKPHOS 77  BILITOT 0.9    PROT 6.8  ALBUMIN 3.5   No results for input(s): LIPASE, AMYLASE in the last 168 hours. No results for input(s): AMMONIA in the last 168 hours. CBC: Recent Labs  Lab 09/18/19 2224 09/19/19 0643 09/20/19 0544 09/21/19 0556 09/22/19 0527  WBC 8.2 7.2 8.1 7.9 8.1  NEUTROABS 5.7  --  6.0 5.3 5.0  HGB 9.6* 9.7* 10.5* 9.7* 10.0*  HCT 33.2* 32.9* 36.7 33.2* 34.3*  MCV 68.5* 68.1* 68.6* 67.9* 67.7*  PLT 220 205 208 180 180   Cardiac Enzymes: No results for input(s): CKTOTAL, CKMB, CKMBINDEX, TROPONINI in the last 168 hours. BNP: BNP (last 3 results) Recent Labs    09/20/19 0544  BNP 482.3*    ProBNP (last 3 results) No results for input(s): PROBNP in the last 8760 hours.  CBG: No results for input(s): GLUCAP in the last 168 hours.     Signed:  Alma Friendly, MD [facility] 09/25/2019, 11:39 AM

## 2019-09-25 NOTE — Progress Notes (Signed)
Pt is being discharged home. Discharge instructions including medications and follow up appointments given. Pt is awaiting delivery of 3 n1 and granddaughter to arrive for discharge pick up. Pt had no further questions at this time.

## 2019-09-26 DIAGNOSIS — I5032 Chronic diastolic (congestive) heart failure: Secondary | ICD-10-CM | POA: Diagnosis not present

## 2019-09-26 DIAGNOSIS — J45909 Unspecified asthma, uncomplicated: Secondary | ICD-10-CM | POA: Diagnosis not present

## 2019-09-26 DIAGNOSIS — E871 Hypo-osmolality and hyponatremia: Secondary | ICD-10-CM | POA: Diagnosis not present

## 2019-09-26 DIAGNOSIS — I272 Pulmonary hypertension, unspecified: Secondary | ICD-10-CM | POA: Diagnosis not present

## 2019-09-26 DIAGNOSIS — I081 Rheumatic disorders of both mitral and tricuspid valves: Secondary | ICD-10-CM | POA: Diagnosis not present

## 2019-09-26 DIAGNOSIS — D509 Iron deficiency anemia, unspecified: Secondary | ICD-10-CM | POA: Diagnosis not present

## 2019-09-26 DIAGNOSIS — L03116 Cellulitis of left lower limb: Secondary | ICD-10-CM | POA: Diagnosis not present

## 2019-09-26 DIAGNOSIS — D631 Anemia in chronic kidney disease: Secondary | ICD-10-CM | POA: Diagnosis not present

## 2019-09-26 DIAGNOSIS — L03115 Cellulitis of right lower limb: Secondary | ICD-10-CM | POA: Diagnosis not present

## 2019-09-26 DIAGNOSIS — Z7901 Long term (current) use of anticoagulants: Secondary | ICD-10-CM | POA: Diagnosis not present

## 2019-09-26 DIAGNOSIS — N184 Chronic kidney disease, stage 4 (severe): Secondary | ICD-10-CM | POA: Diagnosis not present

## 2019-09-26 DIAGNOSIS — N179 Acute kidney failure, unspecified: Secondary | ICD-10-CM | POA: Diagnosis not present

## 2019-09-26 DIAGNOSIS — K219 Gastro-esophageal reflux disease without esophagitis: Secondary | ICD-10-CM | POA: Diagnosis not present

## 2019-09-26 DIAGNOSIS — I890 Lymphedema, not elsewhere classified: Secondary | ICD-10-CM | POA: Diagnosis not present

## 2019-09-26 DIAGNOSIS — I480 Paroxysmal atrial fibrillation: Secondary | ICD-10-CM | POA: Diagnosis not present

## 2019-09-26 DIAGNOSIS — I130 Hypertensive heart and chronic kidney disease with heart failure and stage 1 through stage 4 chronic kidney disease, or unspecified chronic kidney disease: Secondary | ICD-10-CM | POA: Diagnosis not present

## 2019-09-26 DIAGNOSIS — I872 Venous insufficiency (chronic) (peripheral): Secondary | ICD-10-CM | POA: Diagnosis not present

## 2019-09-26 DIAGNOSIS — I0981 Rheumatic heart failure: Secondary | ICD-10-CM | POA: Diagnosis not present

## 2019-09-29 DIAGNOSIS — I739 Peripheral vascular disease, unspecified: Secondary | ICD-10-CM | POA: Diagnosis not present

## 2019-09-29 DIAGNOSIS — D649 Anemia, unspecified: Secondary | ICD-10-CM | POA: Diagnosis not present

## 2019-09-29 DIAGNOSIS — N184 Chronic kidney disease, stage 4 (severe): Secondary | ICD-10-CM | POA: Diagnosis not present

## 2019-09-29 DIAGNOSIS — I130 Hypertensive heart and chronic kidney disease with heart failure and stage 1 through stage 4 chronic kidney disease, or unspecified chronic kidney disease: Secondary | ICD-10-CM | POA: Diagnosis not present

## 2019-09-29 DIAGNOSIS — L03115 Cellulitis of right lower limb: Secondary | ICD-10-CM | POA: Diagnosis not present

## 2019-09-29 DIAGNOSIS — I5032 Chronic diastolic (congestive) heart failure: Secondary | ICD-10-CM | POA: Diagnosis not present

## 2019-09-29 DIAGNOSIS — I480 Paroxysmal atrial fibrillation: Secondary | ICD-10-CM | POA: Diagnosis not present

## 2019-09-29 DIAGNOSIS — L03116 Cellulitis of left lower limb: Secondary | ICD-10-CM | POA: Diagnosis not present

## 2019-10-01 DIAGNOSIS — I0981 Rheumatic heart failure: Secondary | ICD-10-CM | POA: Diagnosis not present

## 2019-10-01 DIAGNOSIS — I130 Hypertensive heart and chronic kidney disease with heart failure and stage 1 through stage 4 chronic kidney disease, or unspecified chronic kidney disease: Secondary | ICD-10-CM | POA: Diagnosis not present

## 2019-10-01 DIAGNOSIS — L03115 Cellulitis of right lower limb: Secondary | ICD-10-CM | POA: Diagnosis not present

## 2019-10-01 DIAGNOSIS — L03116 Cellulitis of left lower limb: Secondary | ICD-10-CM | POA: Diagnosis not present

## 2019-10-01 DIAGNOSIS — I890 Lymphedema, not elsewhere classified: Secondary | ICD-10-CM | POA: Diagnosis not present

## 2019-10-01 DIAGNOSIS — I872 Venous insufficiency (chronic) (peripheral): Secondary | ICD-10-CM | POA: Diagnosis not present

## 2019-10-02 DIAGNOSIS — B351 Tinea unguium: Secondary | ICD-10-CM | POA: Diagnosis not present

## 2019-10-02 DIAGNOSIS — I739 Peripheral vascular disease, unspecified: Secondary | ICD-10-CM | POA: Diagnosis not present

## 2019-10-02 DIAGNOSIS — L600 Ingrowing nail: Secondary | ICD-10-CM | POA: Diagnosis not present

## 2019-10-02 DIAGNOSIS — M79671 Pain in right foot: Secondary | ICD-10-CM | POA: Diagnosis not present

## 2019-10-02 DIAGNOSIS — M79672 Pain in left foot: Secondary | ICD-10-CM | POA: Diagnosis not present

## 2019-10-02 DIAGNOSIS — L603 Nail dystrophy: Secondary | ICD-10-CM | POA: Diagnosis not present

## 2019-10-03 ENCOUNTER — Telehealth: Payer: Self-pay | Admitting: *Deleted

## 2019-10-03 NOTE — Telephone Encounter (Signed)
Received a Palliative care referral from Dr. Philip Aspen. Called and spoke with patient's daughter, [name]. Home visit scheduled for 10/10/19 at 1:00 pm.

## 2019-10-04 DIAGNOSIS — L03115 Cellulitis of right lower limb: Secondary | ICD-10-CM | POA: Diagnosis not present

## 2019-10-04 DIAGNOSIS — I872 Venous insufficiency (chronic) (peripheral): Secondary | ICD-10-CM | POA: Diagnosis not present

## 2019-10-04 DIAGNOSIS — I130 Hypertensive heart and chronic kidney disease with heart failure and stage 1 through stage 4 chronic kidney disease, or unspecified chronic kidney disease: Secondary | ICD-10-CM | POA: Diagnosis not present

## 2019-10-04 DIAGNOSIS — L03116 Cellulitis of left lower limb: Secondary | ICD-10-CM | POA: Diagnosis not present

## 2019-10-04 DIAGNOSIS — I890 Lymphedema, not elsewhere classified: Secondary | ICD-10-CM | POA: Diagnosis not present

## 2019-10-04 DIAGNOSIS — I0981 Rheumatic heart failure: Secondary | ICD-10-CM | POA: Diagnosis not present

## 2019-10-06 DIAGNOSIS — L03116 Cellulitis of left lower limb: Secondary | ICD-10-CM | POA: Diagnosis not present

## 2019-10-06 DIAGNOSIS — I130 Hypertensive heart and chronic kidney disease with heart failure and stage 1 through stage 4 chronic kidney disease, or unspecified chronic kidney disease: Secondary | ICD-10-CM | POA: Diagnosis not present

## 2019-10-06 DIAGNOSIS — I890 Lymphedema, not elsewhere classified: Secondary | ICD-10-CM | POA: Diagnosis not present

## 2019-10-06 DIAGNOSIS — L03115 Cellulitis of right lower limb: Secondary | ICD-10-CM | POA: Diagnosis not present

## 2019-10-06 DIAGNOSIS — I0981 Rheumatic heart failure: Secondary | ICD-10-CM | POA: Diagnosis not present

## 2019-10-06 DIAGNOSIS — I872 Venous insufficiency (chronic) (peripheral): Secondary | ICD-10-CM | POA: Diagnosis not present

## 2019-10-09 DIAGNOSIS — I739 Peripheral vascular disease, unspecified: Secondary | ICD-10-CM | POA: Diagnosis not present

## 2019-10-09 DIAGNOSIS — I130 Hypertensive heart and chronic kidney disease with heart failure and stage 1 through stage 4 chronic kidney disease, or unspecified chronic kidney disease: Secondary | ICD-10-CM | POA: Diagnosis not present

## 2019-10-09 DIAGNOSIS — I5032 Chronic diastolic (congestive) heart failure: Secondary | ICD-10-CM | POA: Diagnosis not present

## 2019-10-09 DIAGNOSIS — L03116 Cellulitis of left lower limb: Secondary | ICD-10-CM | POA: Diagnosis not present

## 2019-10-09 DIAGNOSIS — L03115 Cellulitis of right lower limb: Secondary | ICD-10-CM | POA: Diagnosis not present

## 2019-10-09 DIAGNOSIS — N184 Chronic kidney disease, stage 4 (severe): Secondary | ICD-10-CM | POA: Diagnosis not present

## 2019-10-10 ENCOUNTER — Other Ambulatory Visit: Payer: Self-pay

## 2019-10-10 ENCOUNTER — Other Ambulatory Visit: Payer: Medicare Other

## 2019-10-10 ENCOUNTER — Other Ambulatory Visit: Payer: Medicare Other | Admitting: *Deleted

## 2019-10-10 DIAGNOSIS — Z515 Encounter for palliative care: Secondary | ICD-10-CM
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
## 2019-10-11 DIAGNOSIS — L03115 Cellulitis of right lower limb: Secondary | ICD-10-CM | POA: Diagnosis not present

## 2019-10-11 DIAGNOSIS — D631 Anemia in chronic kidney disease: Secondary | ICD-10-CM | POA: Diagnosis not present

## 2019-10-11 DIAGNOSIS — I272 Pulmonary hypertension, unspecified: Secondary | ICD-10-CM | POA: Diagnosis not present

## 2019-10-11 DIAGNOSIS — I4891 Unspecified atrial fibrillation: Secondary | ICD-10-CM | POA: Diagnosis not present

## 2019-10-11 DIAGNOSIS — J302 Other seasonal allergic rhinitis: Secondary | ICD-10-CM | POA: Diagnosis not present

## 2019-10-11 DIAGNOSIS — H9190 Unspecified hearing loss, unspecified ear: Secondary | ICD-10-CM | POA: Diagnosis not present

## 2019-10-11 DIAGNOSIS — K219 Gastro-esophageal reflux disease without esophagitis: Secondary | ICD-10-CM | POA: Diagnosis not present

## 2019-10-11 DIAGNOSIS — I130 Hypertensive heart and chronic kidney disease with heart failure and stage 1 through stage 4 chronic kidney disease, or unspecified chronic kidney disease: Secondary | ICD-10-CM | POA: Diagnosis not present

## 2019-10-11 DIAGNOSIS — L03116 Cellulitis of left lower limb: Secondary | ICD-10-CM | POA: Diagnosis not present

## 2019-10-11 DIAGNOSIS — H548 Legal blindness, as defined in USA: Secondary | ICD-10-CM | POA: Diagnosis not present

## 2019-10-11 DIAGNOSIS — L89152 Pressure ulcer of sacral region, stage 2: Secondary | ICD-10-CM | POA: Diagnosis not present

## 2019-10-11 DIAGNOSIS — N184 Chronic kidney disease, stage 4 (severe): Secondary | ICD-10-CM | POA: Diagnosis not present

## 2019-10-11 DIAGNOSIS — I5030 Unspecified diastolic (congestive) heart failure: Secondary | ICD-10-CM | POA: Diagnosis not present

## 2019-10-11 DIAGNOSIS — I872 Venous insufficiency (chronic) (peripheral): Secondary | ICD-10-CM | POA: Diagnosis not present

## 2019-10-11 DIAGNOSIS — J45909 Unspecified asthma, uncomplicated: Secondary | ICD-10-CM | POA: Diagnosis not present

## 2019-10-11 NOTE — Progress Notes (Signed)
COMMUNITY PALLIATIVE CARE SW NOTE  PATIENT NAME: Lori [name] DOB: 03-28-1919 MRN: [id number]  PRIMARY CARE PROVIDER: Leanna Battles, MD  RESPONSIBLE PARTY:  Acct ID - Guarantor Home Phone Work Phone Relationship Acct Type  [id number] Lori [name]  Self P/F     [address] , [place] [address]     PLAN OF CARE and INTERVENTIONS:             1. GOALS OF CARE/ ADVANCE CARE PLANNING: Patient is a DNR. She has a POA/HCPOA and Living Will 2. SOCIAL/EMOTIONAL/SPIRITUAL ASSESSMENT/ INTERVENTIONS: Palliative Care SW and RN-Lori [name] visited patient at her independent apartment. She was present with  her  PCG/POA-Lori [name]. Patient was sitting on her walker, drinking coffee and eating a snack. Patient was engaged with the team, providing social and medical history. She is hard of hearing but alert and oriented x3. Her PCG assisted with providing patient's social and medical history. Patient is aware of chronic condition. Her decline started in December following a hospitalization. She has had several falls, suffers from macular degeneration, swallowing issuesincreased fatigue and shortness of breath with activity in creased sleeping, wounds to her buttocks and edema to her legs. Patient continues to try to do chores around the house, which makes her more increased for falls.Her PCG advised that patient was to be evaluated for hospice as her disease continues to rapidly progress and she is declining physically and is  needing more assistance with ADL's.. Patient desires to remain in her home, as independent as possible. Patient is from [place], [place]. She widowed. 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 She has one son-Lori [name] who lives in [place]. Patient is Catholic by faith and attend the [employer] and receives support from Lori [name].   SW provided education regarding the palliative care program, active and reflective listening,  supportive counseling, normalizing safety concerns and feelings, assessed needs and coping, education on grief counseling.  3. PATIENT/CAREGIVER EDUCATION/ COPING: SW and RN provided education on palliative care services, how they differ from hospice services. SW also provided education regarding available resources. Patient is aware of her physical limitations, but admit  she often forgets. Patient enjoyes 4. PERSONAL EMERGENCY PLAN: Patient/PCG can call 911 for emergencies. Education was provided regarding Life Alert button for emergencies. PCG has daily face-to-face contact with patient , but will leaving out of the state for 3 months at the end of the month. 5. COMMUNITY RESOURCES COORDINATION/ HEALTH CARE NAVIGATION: Patient receives Meals on Wheels. She has a locked box on her apartment that is known to the apartment staff and emergency personal. Patient has a lift chair and utilizes her walker to ambulate. 6. FINANCIAL/LEGAL CONCERNS/INTERVENTIONS: No financial or legal concerns. Safety remains a concern for PCG as patient is at risk for falls due to decline in physical function.     SOCIAL HX:  Social History   Tobacco Use  . Smoking status: Former Smoker    Quit date: 07/28/1959    Years since quitting: 60.2  . Smokeless tobacco: Never Used  Substance Use Topics  . Alcohol use: No    CODE STATUS:   Code Status: Prior DNR ADVANCED DIRECTIVES: N MOST FORM COMPLETE:  No HOSPICE EDUCATION PROVIDED: Yes. PCG request that patient be assessed for hospice services.   PPS: Patient ambulates with her walker, she has poor safety awareness, and is at a high risk for falls due to progression of her disease.   SW spent  60 minutes with patient and PCG to provide education, active listening, supportive counseling, assess need and coping, education regarding resources, palliative/hospice services and reassurance of support.       [address] [place], [place]

## 2019-10-12 DIAGNOSIS — I130 Hypertensive heart and chronic kidney disease with heart failure and stage 1 through stage 4 chronic kidney disease, or unspecified chronic kidney disease: Secondary | ICD-10-CM | POA: Diagnosis not present

## 2019-10-12 DIAGNOSIS — N184 Chronic kidney disease, stage 4 (severe): Secondary | ICD-10-CM | POA: Diagnosis not present

## 2019-10-12 DIAGNOSIS — L03116 Cellulitis of left lower limb: Secondary | ICD-10-CM | POA: Diagnosis not present

## 2019-10-12 DIAGNOSIS — I4891 Unspecified atrial fibrillation: Secondary | ICD-10-CM | POA: Diagnosis not present

## 2019-10-12 DIAGNOSIS — I5030 Unspecified diastolic (congestive) heart failure: Secondary | ICD-10-CM | POA: Diagnosis not present

## 2019-10-12 DIAGNOSIS — L03115 Cellulitis of right lower limb: Secondary | ICD-10-CM | POA: Diagnosis not present

## 2019-10-13 DIAGNOSIS — I130 Hypertensive heart and chronic kidney disease with heart failure and stage 1 through stage 4 chronic kidney disease, or unspecified chronic kidney disease: Secondary | ICD-10-CM | POA: Diagnosis not present

## 2019-10-13 DIAGNOSIS — N184 Chronic kidney disease, stage 4 (severe): Secondary | ICD-10-CM | POA: Diagnosis not present

## 2019-10-13 DIAGNOSIS — I5030 Unspecified diastolic (congestive) heart failure: Secondary | ICD-10-CM | POA: Diagnosis not present

## 2019-10-13 DIAGNOSIS — L03116 Cellulitis of left lower limb: Secondary | ICD-10-CM | POA: Diagnosis not present

## 2019-10-13 DIAGNOSIS — L03115 Cellulitis of right lower limb: Secondary | ICD-10-CM | POA: Diagnosis not present

## 2019-10-13 DIAGNOSIS — I4891 Unspecified atrial fibrillation: Secondary | ICD-10-CM | POA: Diagnosis not present

## 2019-10-16 DIAGNOSIS — I5030 Unspecified diastolic (congestive) heart failure: Secondary | ICD-10-CM | POA: Diagnosis not present

## 2019-10-16 DIAGNOSIS — L03116 Cellulitis of left lower limb: Secondary | ICD-10-CM | POA: Diagnosis not present

## 2019-10-16 DIAGNOSIS — I4891 Unspecified atrial fibrillation: Secondary | ICD-10-CM | POA: Diagnosis not present

## 2019-10-16 DIAGNOSIS — N184 Chronic kidney disease, stage 4 (severe): Secondary | ICD-10-CM | POA: Diagnosis not present

## 2019-10-16 DIAGNOSIS — I130 Hypertensive heart and chronic kidney disease with heart failure and stage 1 through stage 4 chronic kidney disease, or unspecified chronic kidney disease: Secondary | ICD-10-CM | POA: Diagnosis not present

## 2019-10-16 DIAGNOSIS — L03115 Cellulitis of right lower limb: Secondary | ICD-10-CM | POA: Diagnosis not present

## 2019-10-17 DIAGNOSIS — L03115 Cellulitis of right lower limb: Secondary | ICD-10-CM | POA: Diagnosis not present

## 2019-10-17 DIAGNOSIS — I4891 Unspecified atrial fibrillation: Secondary | ICD-10-CM | POA: Diagnosis not present

## 2019-10-17 DIAGNOSIS — I130 Hypertensive heart and chronic kidney disease with heart failure and stage 1 through stage 4 chronic kidney disease, or unspecified chronic kidney disease: Secondary | ICD-10-CM | POA: Diagnosis not present

## 2019-10-17 DIAGNOSIS — N184 Chronic kidney disease, stage 4 (severe): Secondary | ICD-10-CM | POA: Diagnosis not present

## 2019-10-17 DIAGNOSIS — I5030 Unspecified diastolic (congestive) heart failure: Secondary | ICD-10-CM | POA: Diagnosis not present

## 2019-10-17 DIAGNOSIS — L03116 Cellulitis of left lower limb: Secondary | ICD-10-CM | POA: Diagnosis not present

## 2019-10-18 DIAGNOSIS — I5030 Unspecified diastolic (congestive) heart failure: Secondary | ICD-10-CM | POA: Diagnosis not present

## 2019-10-18 DIAGNOSIS — I4891 Unspecified atrial fibrillation: Secondary | ICD-10-CM | POA: Diagnosis not present

## 2019-10-18 DIAGNOSIS — I130 Hypertensive heart and chronic kidney disease with heart failure and stage 1 through stage 4 chronic kidney disease, or unspecified chronic kidney disease: Secondary | ICD-10-CM | POA: Diagnosis not present

## 2019-10-18 DIAGNOSIS — L03115 Cellulitis of right lower limb: Secondary | ICD-10-CM | POA: Diagnosis not present

## 2019-10-18 DIAGNOSIS — L03116 Cellulitis of left lower limb: Secondary | ICD-10-CM | POA: Diagnosis not present

## 2019-10-18 DIAGNOSIS — N184 Chronic kidney disease, stage 4 (severe): Secondary | ICD-10-CM | POA: Diagnosis not present

## 2019-10-19 DIAGNOSIS — N184 Chronic kidney disease, stage 4 (severe): Secondary | ICD-10-CM | POA: Diagnosis not present

## 2019-10-19 DIAGNOSIS — I4891 Unspecified atrial fibrillation: Secondary | ICD-10-CM | POA: Diagnosis not present

## 2019-10-19 DIAGNOSIS — L03115 Cellulitis of right lower limb: Secondary | ICD-10-CM | POA: Diagnosis not present

## 2019-10-19 DIAGNOSIS — L03116 Cellulitis of left lower limb: Secondary | ICD-10-CM | POA: Diagnosis not present

## 2019-10-19 DIAGNOSIS — I5030 Unspecified diastolic (congestive) heart failure: Secondary | ICD-10-CM | POA: Diagnosis not present

## 2019-10-19 DIAGNOSIS — I130 Hypertensive heart and chronic kidney disease with heart failure and stage 1 through stage 4 chronic kidney disease, or unspecified chronic kidney disease: Secondary | ICD-10-CM | POA: Diagnosis not present

## 2019-10-23 DIAGNOSIS — I130 Hypertensive heart and chronic kidney disease with heart failure and stage 1 through stage 4 chronic kidney disease, or unspecified chronic kidney disease: Secondary | ICD-10-CM | POA: Diagnosis not present

## 2019-10-23 DIAGNOSIS — L03115 Cellulitis of right lower limb: Secondary | ICD-10-CM | POA: Diagnosis not present

## 2019-10-23 DIAGNOSIS — N184 Chronic kidney disease, stage 4 (severe): Secondary | ICD-10-CM | POA: Diagnosis not present

## 2019-10-23 DIAGNOSIS — I4891 Unspecified atrial fibrillation: Secondary | ICD-10-CM | POA: Diagnosis not present

## 2019-10-23 DIAGNOSIS — I5030 Unspecified diastolic (congestive) heart failure: Secondary | ICD-10-CM | POA: Diagnosis not present

## 2019-10-23 DIAGNOSIS — L03116 Cellulitis of left lower limb: Secondary | ICD-10-CM | POA: Diagnosis not present

## 2019-10-24 DIAGNOSIS — I4891 Unspecified atrial fibrillation: Secondary | ICD-10-CM | POA: Diagnosis not present

## 2019-10-24 DIAGNOSIS — I5030 Unspecified diastolic (congestive) heart failure: Secondary | ICD-10-CM | POA: Diagnosis not present

## 2019-10-24 DIAGNOSIS — I130 Hypertensive heart and chronic kidney disease with heart failure and stage 1 through stage 4 chronic kidney disease, or unspecified chronic kidney disease: Secondary | ICD-10-CM | POA: Diagnosis not present

## 2019-10-24 DIAGNOSIS — L03116 Cellulitis of left lower limb: Secondary | ICD-10-CM | POA: Diagnosis not present

## 2019-10-24 DIAGNOSIS — N184 Chronic kidney disease, stage 4 (severe): Secondary | ICD-10-CM | POA: Diagnosis not present

## 2019-10-24 DIAGNOSIS — L03115 Cellulitis of right lower limb: Secondary | ICD-10-CM | POA: Diagnosis not present

## 2019-10-26 DIAGNOSIS — I4891 Unspecified atrial fibrillation: Secondary | ICD-10-CM | POA: Diagnosis not present

## 2019-10-26 DIAGNOSIS — I5030 Unspecified diastolic (congestive) heart failure: Secondary | ICD-10-CM | POA: Diagnosis not present

## 2019-10-26 DIAGNOSIS — I130 Hypertensive heart and chronic kidney disease with heart failure and stage 1 through stage 4 chronic kidney disease, or unspecified chronic kidney disease: Secondary | ICD-10-CM | POA: Diagnosis not present

## 2019-10-26 DIAGNOSIS — L03115 Cellulitis of right lower limb: Secondary | ICD-10-CM | POA: Diagnosis not present

## 2019-10-26 DIAGNOSIS — N184 Chronic kidney disease, stage 4 (severe): Secondary | ICD-10-CM | POA: Diagnosis not present

## 2019-10-26 DIAGNOSIS — L03116 Cellulitis of left lower limb: Secondary | ICD-10-CM | POA: Diagnosis not present

## 2019-10-30 DIAGNOSIS — L03115 Cellulitis of right lower limb: Secondary | ICD-10-CM | POA: Diagnosis not present

## 2019-10-30 DIAGNOSIS — I4891 Unspecified atrial fibrillation: Secondary | ICD-10-CM | POA: Diagnosis not present

## 2019-10-30 DIAGNOSIS — L03116 Cellulitis of left lower limb: Secondary | ICD-10-CM | POA: Diagnosis not present

## 2019-10-30 DIAGNOSIS — I5030 Unspecified diastolic (congestive) heart failure: Secondary | ICD-10-CM | POA: Diagnosis not present

## 2019-10-30 DIAGNOSIS — I130 Hypertensive heart and chronic kidney disease with heart failure and stage 1 through stage 4 chronic kidney disease, or unspecified chronic kidney disease: Secondary | ICD-10-CM | POA: Diagnosis not present

## 2019-10-30 DIAGNOSIS — N184 Chronic kidney disease, stage 4 (severe): Secondary | ICD-10-CM | POA: Diagnosis not present

## 2019-11-02 DIAGNOSIS — I4891 Unspecified atrial fibrillation: Secondary | ICD-10-CM | POA: Diagnosis not present

## 2019-11-02 DIAGNOSIS — L03115 Cellulitis of right lower limb: Secondary | ICD-10-CM | POA: Diagnosis not present

## 2019-11-02 DIAGNOSIS — I5030 Unspecified diastolic (congestive) heart failure: Secondary | ICD-10-CM | POA: Diagnosis not present

## 2019-11-02 DIAGNOSIS — I130 Hypertensive heart and chronic kidney disease with heart failure and stage 1 through stage 4 chronic kidney disease, or unspecified chronic kidney disease: Secondary | ICD-10-CM | POA: Diagnosis not present

## 2019-11-02 DIAGNOSIS — L03116 Cellulitis of left lower limb: Secondary | ICD-10-CM | POA: Diagnosis not present

## 2019-11-02 DIAGNOSIS — N184 Chronic kidney disease, stage 4 (severe): Secondary | ICD-10-CM | POA: Diagnosis not present

## 2019-11-04 DIAGNOSIS — L03115 Cellulitis of right lower limb: Secondary | ICD-10-CM | POA: Diagnosis not present

## 2019-11-04 DIAGNOSIS — K219 Gastro-esophageal reflux disease without esophagitis: Secondary | ICD-10-CM | POA: Diagnosis not present

## 2019-11-04 DIAGNOSIS — I872 Venous insufficiency (chronic) (peripheral): Secondary | ICD-10-CM | POA: Diagnosis not present

## 2019-11-04 DIAGNOSIS — N184 Chronic kidney disease, stage 4 (severe): Secondary | ICD-10-CM | POA: Diagnosis not present

## 2019-11-04 DIAGNOSIS — L89152 Pressure ulcer of sacral region, stage 2: Secondary | ICD-10-CM | POA: Diagnosis not present

## 2019-11-04 DIAGNOSIS — I5030 Unspecified diastolic (congestive) heart failure: Secondary | ICD-10-CM | POA: Diagnosis not present

## 2019-11-04 DIAGNOSIS — J302 Other seasonal allergic rhinitis: Secondary | ICD-10-CM | POA: Diagnosis not present

## 2019-11-04 DIAGNOSIS — J45909 Unspecified asthma, uncomplicated: Secondary | ICD-10-CM | POA: Diagnosis not present

## 2019-11-04 DIAGNOSIS — D631 Anemia in chronic kidney disease: Secondary | ICD-10-CM | POA: Diagnosis not present

## 2019-11-04 DIAGNOSIS — I272 Pulmonary hypertension, unspecified: Secondary | ICD-10-CM | POA: Diagnosis not present

## 2019-11-04 DIAGNOSIS — L03116 Cellulitis of left lower limb: Secondary | ICD-10-CM | POA: Diagnosis not present

## 2019-11-04 DIAGNOSIS — H548 Legal blindness, as defined in USA: Secondary | ICD-10-CM | POA: Diagnosis not present

## 2019-11-04 DIAGNOSIS — H9190 Unspecified hearing loss, unspecified ear: Secondary | ICD-10-CM | POA: Diagnosis not present

## 2019-11-04 DIAGNOSIS — I4891 Unspecified atrial fibrillation: Secondary | ICD-10-CM | POA: Diagnosis not present

## 2019-11-04 DIAGNOSIS — I130 Hypertensive heart and chronic kidney disease with heart failure and stage 1 through stage 4 chronic kidney disease, or unspecified chronic kidney disease: Secondary | ICD-10-CM | POA: Diagnosis not present

## 2019-11-06 DIAGNOSIS — L03115 Cellulitis of right lower limb: Secondary | ICD-10-CM | POA: Diagnosis not present

## 2019-11-06 DIAGNOSIS — I130 Hypertensive heart and chronic kidney disease with heart failure and stage 1 through stage 4 chronic kidney disease, or unspecified chronic kidney disease: Secondary | ICD-10-CM | POA: Diagnosis not present

## 2019-11-06 DIAGNOSIS — I5030 Unspecified diastolic (congestive) heart failure: Secondary | ICD-10-CM | POA: Diagnosis not present

## 2019-11-06 DIAGNOSIS — L03116 Cellulitis of left lower limb: Secondary | ICD-10-CM | POA: Diagnosis not present

## 2019-11-06 DIAGNOSIS — I4891 Unspecified atrial fibrillation: Secondary | ICD-10-CM | POA: Diagnosis not present

## 2019-11-06 DIAGNOSIS — N184 Chronic kidney disease, stage 4 (severe): Secondary | ICD-10-CM | POA: Diagnosis not present

## 2019-11-07 DIAGNOSIS — L03116 Cellulitis of left lower limb: Secondary | ICD-10-CM | POA: Diagnosis not present

## 2019-11-07 DIAGNOSIS — N184 Chronic kidney disease, stage 4 (severe): Secondary | ICD-10-CM | POA: Diagnosis not present

## 2019-11-07 DIAGNOSIS — I4891 Unspecified atrial fibrillation: Secondary | ICD-10-CM | POA: Diagnosis not present

## 2019-11-07 DIAGNOSIS — I5030 Unspecified diastolic (congestive) heart failure: Secondary | ICD-10-CM | POA: Diagnosis not present

## 2019-11-07 DIAGNOSIS — L03115 Cellulitis of right lower limb: Secondary | ICD-10-CM | POA: Diagnosis not present

## 2019-11-07 DIAGNOSIS — I130 Hypertensive heart and chronic kidney disease with heart failure and stage 1 through stage 4 chronic kidney disease, or unspecified chronic kidney disease: Secondary | ICD-10-CM | POA: Diagnosis not present

## 2019-11-08 DIAGNOSIS — L03115 Cellulitis of right lower limb: Secondary | ICD-10-CM | POA: Diagnosis not present

## 2019-11-08 DIAGNOSIS — I130 Hypertensive heart and chronic kidney disease with heart failure and stage 1 through stage 4 chronic kidney disease, or unspecified chronic kidney disease: Secondary | ICD-10-CM | POA: Diagnosis not present

## 2019-11-08 DIAGNOSIS — L03116 Cellulitis of left lower limb: Secondary | ICD-10-CM | POA: Diagnosis not present

## 2019-11-08 DIAGNOSIS — N184 Chronic kidney disease, stage 4 (severe): Secondary | ICD-10-CM | POA: Diagnosis not present

## 2019-11-08 DIAGNOSIS — I4891 Unspecified atrial fibrillation: Secondary | ICD-10-CM | POA: Diagnosis not present

## 2019-11-08 DIAGNOSIS — I5030 Unspecified diastolic (congestive) heart failure: Secondary | ICD-10-CM | POA: Diagnosis not present

## 2019-11-09 DIAGNOSIS — I5030 Unspecified diastolic (congestive) heart failure: Secondary | ICD-10-CM | POA: Diagnosis not present

## 2019-11-09 DIAGNOSIS — I130 Hypertensive heart and chronic kidney disease with heart failure and stage 1 through stage 4 chronic kidney disease, or unspecified chronic kidney disease: Secondary | ICD-10-CM | POA: Diagnosis not present

## 2019-11-09 DIAGNOSIS — N184 Chronic kidney disease, stage 4 (severe): Secondary | ICD-10-CM | POA: Diagnosis not present

## 2019-11-09 DIAGNOSIS — I4891 Unspecified atrial fibrillation: Secondary | ICD-10-CM | POA: Diagnosis not present

## 2019-11-09 DIAGNOSIS — L03115 Cellulitis of right lower limb: Secondary | ICD-10-CM | POA: Diagnosis not present

## 2019-11-09 DIAGNOSIS — L03116 Cellulitis of left lower limb: Secondary | ICD-10-CM | POA: Diagnosis not present

## 2019-11-10 DIAGNOSIS — I4891 Unspecified atrial fibrillation: Secondary | ICD-10-CM | POA: Diagnosis not present

## 2019-11-10 DIAGNOSIS — I130 Hypertensive heart and chronic kidney disease with heart failure and stage 1 through stage 4 chronic kidney disease, or unspecified chronic kidney disease: Secondary | ICD-10-CM | POA: Diagnosis not present

## 2019-11-10 DIAGNOSIS — L03115 Cellulitis of right lower limb: Secondary | ICD-10-CM | POA: Diagnosis not present

## 2019-11-10 DIAGNOSIS — I5030 Unspecified diastolic (congestive) heart failure: Secondary | ICD-10-CM | POA: Diagnosis not present

## 2019-11-10 DIAGNOSIS — N184 Chronic kidney disease, stage 4 (severe): Secondary | ICD-10-CM | POA: Diagnosis not present

## 2019-11-10 DIAGNOSIS — L03116 Cellulitis of left lower limb: Secondary | ICD-10-CM | POA: Diagnosis not present

## 2019-11-11 DIAGNOSIS — L03116 Cellulitis of left lower limb: Secondary | ICD-10-CM | POA: Diagnosis not present

## 2019-11-11 DIAGNOSIS — N184 Chronic kidney disease, stage 4 (severe): Secondary | ICD-10-CM | POA: Diagnosis not present

## 2019-11-11 DIAGNOSIS — L03115 Cellulitis of right lower limb: Secondary | ICD-10-CM | POA: Diagnosis not present

## 2019-11-11 DIAGNOSIS — I4891 Unspecified atrial fibrillation: Secondary | ICD-10-CM | POA: Diagnosis not present

## 2019-11-11 DIAGNOSIS — I5030 Unspecified diastolic (congestive) heart failure: Secondary | ICD-10-CM | POA: Diagnosis not present

## 2019-11-11 DIAGNOSIS — I130 Hypertensive heart and chronic kidney disease with heart failure and stage 1 through stage 4 chronic kidney disease, or unspecified chronic kidney disease: Secondary | ICD-10-CM | POA: Diagnosis not present

## 2019-11-13 DIAGNOSIS — I130 Hypertensive heart and chronic kidney disease with heart failure and stage 1 through stage 4 chronic kidney disease, or unspecified chronic kidney disease: Secondary | ICD-10-CM | POA: Diagnosis not present

## 2019-11-13 DIAGNOSIS — I5030 Unspecified diastolic (congestive) heart failure: Secondary | ICD-10-CM | POA: Diagnosis not present

## 2019-11-13 DIAGNOSIS — N184 Chronic kidney disease, stage 4 (severe): Secondary | ICD-10-CM | POA: Diagnosis not present

## 2019-11-13 DIAGNOSIS — I4891 Unspecified atrial fibrillation: Secondary | ICD-10-CM | POA: Diagnosis not present

## 2019-11-13 DIAGNOSIS — L03115 Cellulitis of right lower limb: Secondary | ICD-10-CM | POA: Diagnosis not present

## 2019-11-13 DIAGNOSIS — L03116 Cellulitis of left lower limb: Secondary | ICD-10-CM | POA: Diagnosis not present

## 2019-11-14 DIAGNOSIS — L03115 Cellulitis of right lower limb: Secondary | ICD-10-CM | POA: Diagnosis not present

## 2019-11-14 DIAGNOSIS — I5030 Unspecified diastolic (congestive) heart failure: Secondary | ICD-10-CM | POA: Diagnosis not present

## 2019-11-14 DIAGNOSIS — I4891 Unspecified atrial fibrillation: Secondary | ICD-10-CM | POA: Diagnosis not present

## 2019-11-14 DIAGNOSIS — L03116 Cellulitis of left lower limb: Secondary | ICD-10-CM | POA: Diagnosis not present

## 2019-11-14 DIAGNOSIS — I130 Hypertensive heart and chronic kidney disease with heart failure and stage 1 through stage 4 chronic kidney disease, or unspecified chronic kidney disease: Secondary | ICD-10-CM | POA: Diagnosis not present

## 2019-11-14 DIAGNOSIS — N184 Chronic kidney disease, stage 4 (severe): Secondary | ICD-10-CM | POA: Diagnosis not present

## 2019-11-15 DIAGNOSIS — N184 Chronic kidney disease, stage 4 (severe): Secondary | ICD-10-CM | POA: Diagnosis not present

## 2019-11-15 DIAGNOSIS — I5030 Unspecified diastolic (congestive) heart failure: Secondary | ICD-10-CM | POA: Diagnosis not present

## 2019-11-15 DIAGNOSIS — L03115 Cellulitis of right lower limb: Secondary | ICD-10-CM | POA: Diagnosis not present

## 2019-11-15 DIAGNOSIS — I130 Hypertensive heart and chronic kidney disease with heart failure and stage 1 through stage 4 chronic kidney disease, or unspecified chronic kidney disease: Secondary | ICD-10-CM | POA: Diagnosis not present

## 2019-11-15 DIAGNOSIS — L03116 Cellulitis of left lower limb: Secondary | ICD-10-CM | POA: Diagnosis not present

## 2019-11-15 DIAGNOSIS — I4891 Unspecified atrial fibrillation: Secondary | ICD-10-CM | POA: Diagnosis not present

## 2019-11-16 DIAGNOSIS — N184 Chronic kidney disease, stage 4 (severe): Secondary | ICD-10-CM | POA: Diagnosis not present

## 2019-11-16 DIAGNOSIS — I4891 Unspecified atrial fibrillation: Secondary | ICD-10-CM | POA: Diagnosis not present

## 2019-11-16 DIAGNOSIS — L03116 Cellulitis of left lower limb: Secondary | ICD-10-CM | POA: Diagnosis not present

## 2019-11-16 DIAGNOSIS — I5030 Unspecified diastolic (congestive) heart failure: Secondary | ICD-10-CM | POA: Diagnosis not present

## 2019-11-16 DIAGNOSIS — L03115 Cellulitis of right lower limb: Secondary | ICD-10-CM | POA: Diagnosis not present

## 2019-11-16 DIAGNOSIS — I130 Hypertensive heart and chronic kidney disease with heart failure and stage 1 through stage 4 chronic kidney disease, or unspecified chronic kidney disease: Secondary | ICD-10-CM | POA: Diagnosis not present

## 2019-11-17 DIAGNOSIS — L03115 Cellulitis of right lower limb: Secondary | ICD-10-CM | POA: Diagnosis not present

## 2019-11-17 DIAGNOSIS — I4891 Unspecified atrial fibrillation: Secondary | ICD-10-CM | POA: Diagnosis not present

## 2019-11-17 DIAGNOSIS — L03116 Cellulitis of left lower limb: Secondary | ICD-10-CM | POA: Diagnosis not present

## 2019-11-17 DIAGNOSIS — I5030 Unspecified diastolic (congestive) heart failure: Secondary | ICD-10-CM | POA: Diagnosis not present

## 2019-11-17 DIAGNOSIS — I130 Hypertensive heart and chronic kidney disease with heart failure and stage 1 through stage 4 chronic kidney disease, or unspecified chronic kidney disease: Secondary | ICD-10-CM | POA: Diagnosis not present

## 2019-11-17 DIAGNOSIS — N184 Chronic kidney disease, stage 4 (severe): Secondary | ICD-10-CM | POA: Diagnosis not present

## 2019-11-19 ENCOUNTER — Encounter (HOSPITAL_COMMUNITY): Payer: Self-pay | Admitting: Internal Medicine

## 2019-11-19 ENCOUNTER — Other Ambulatory Visit: Payer: Self-pay

## 2019-11-19 ENCOUNTER — Emergency Department (HOSPITAL_COMMUNITY)

## 2019-11-19 ENCOUNTER — Inpatient Hospital Stay (HOSPITAL_COMMUNITY)
Admission: EM | Admit: 2019-11-19 | Discharge: 2019-11-23 | DRG: 698 | Disposition: A | Discharge status: Skilled Nursing Facility | Attending: Family Medicine | Admitting: Family Medicine

## 2019-11-19 DIAGNOSIS — I890 Lymphedema, not elsewhere classified: Secondary | ICD-10-CM | POA: Diagnosis present

## 2019-11-19 DIAGNOSIS — I340 Nonrheumatic mitral (valve) insufficiency: Secondary | ICD-10-CM | POA: Diagnosis present

## 2019-11-19 DIAGNOSIS — Z801 Family history of malignant neoplasm of trachea, bronchus and lung: Secondary | ICD-10-CM

## 2019-11-19 DIAGNOSIS — R339 Retention of urine, unspecified: Secondary | ICD-10-CM | POA: Diagnosis present

## 2019-11-19 DIAGNOSIS — J90 Pleural effusion, not elsewhere classified: Secondary | ICD-10-CM | POA: Diagnosis not present

## 2019-11-19 DIAGNOSIS — G9341 Metabolic encephalopathy: Secondary | ICD-10-CM | POA: Diagnosis not present

## 2019-11-19 DIAGNOSIS — F22 Delusional disorders: Secondary | ICD-10-CM | POA: Diagnosis present

## 2019-11-19 DIAGNOSIS — E785 Hyperlipidemia, unspecified: Secondary | ICD-10-CM | POA: Diagnosis present

## 2019-11-19 DIAGNOSIS — I4891 Unspecified atrial fibrillation: Secondary | ICD-10-CM | POA: Diagnosis present

## 2019-11-19 DIAGNOSIS — L03116 Cellulitis of left lower limb: Secondary | ICD-10-CM | POA: Diagnosis not present

## 2019-11-19 DIAGNOSIS — R296 Repeated falls: Secondary | ICD-10-CM | POA: Diagnosis present

## 2019-11-19 DIAGNOSIS — Z91040 Latex allergy status: Secondary | ICD-10-CM

## 2019-11-19 DIAGNOSIS — J45909 Unspecified asthma, uncomplicated: Secondary | ICD-10-CM | POA: Diagnosis present

## 2019-11-19 DIAGNOSIS — H9190 Unspecified hearing loss, unspecified ear: Secondary | ICD-10-CM | POA: Diagnosis present

## 2019-11-19 DIAGNOSIS — T83511A Infection and inflammatory reaction due to indwelling urethral catheter, initial encounter: Principal | ICD-10-CM | POA: Diagnosis present

## 2019-11-19 DIAGNOSIS — Z20822 Contact with and (suspected) exposure to covid-19: Secondary | ICD-10-CM | POA: Diagnosis present

## 2019-11-19 DIAGNOSIS — R32 Unspecified urinary incontinence: Secondary | ICD-10-CM | POA: Diagnosis present

## 2019-11-19 DIAGNOSIS — N390 Urinary tract infection, site not specified: Secondary | ICD-10-CM | POA: Diagnosis present

## 2019-11-19 DIAGNOSIS — H3530 Unspecified macular degeneration: Secondary | ICD-10-CM | POA: Diagnosis present

## 2019-11-19 DIAGNOSIS — I130 Hypertensive heart and chronic kidney disease with heart failure and stage 1 through stage 4 chronic kidney disease, or unspecified chronic kidney disease: Secondary | ICD-10-CM | POA: Diagnosis present

## 2019-11-19 DIAGNOSIS — R918 Other nonspecific abnormal finding of lung field: Secondary | ICD-10-CM | POA: Diagnosis not present

## 2019-11-19 DIAGNOSIS — Z96652 Presence of left artificial knee joint: Secondary | ICD-10-CM | POA: Diagnosis present

## 2019-11-19 DIAGNOSIS — I480 Paroxysmal atrial fibrillation: Secondary | ICD-10-CM | POA: Diagnosis present

## 2019-11-19 DIAGNOSIS — N184 Chronic kidney disease, stage 4 (severe): Secondary | ICD-10-CM | POA: Diagnosis present

## 2019-11-19 DIAGNOSIS — Z66 Do not resuscitate: Secondary | ICD-10-CM | POA: Diagnosis present

## 2019-11-19 DIAGNOSIS — L0390 Cellulitis, unspecified: Secondary | ICD-10-CM | POA: Diagnosis present

## 2019-11-19 DIAGNOSIS — I10 Essential (primary) hypertension: Secondary | ICD-10-CM | POA: Diagnosis present

## 2019-11-19 DIAGNOSIS — E8809 Other disorders of plasma-protein metabolism, not elsewhere classified: Secondary | ICD-10-CM | POA: Diagnosis present

## 2019-11-19 DIAGNOSIS — R410 Disorientation, unspecified: Secondary | ICD-10-CM | POA: Diagnosis not present

## 2019-11-19 DIAGNOSIS — J9811 Atelectasis: Secondary | ICD-10-CM | POA: Diagnosis present

## 2019-11-19 DIAGNOSIS — L03115 Cellulitis of right lower limb: Secondary | ICD-10-CM | POA: Diagnosis not present

## 2019-11-19 DIAGNOSIS — R54 Age-related physical debility: Secondary | ICD-10-CM | POA: Diagnosis present

## 2019-11-19 DIAGNOSIS — I872 Venous insufficiency (chronic) (peripheral): Secondary | ICD-10-CM | POA: Diagnosis present

## 2019-11-19 DIAGNOSIS — H548 Legal blindness, as defined in USA: Secondary | ICD-10-CM | POA: Diagnosis present

## 2019-11-19 DIAGNOSIS — B9689 Other specified bacterial agents as the cause of diseases classified elsewhere: Secondary | ICD-10-CM | POA: Diagnosis present

## 2019-11-19 DIAGNOSIS — Z96641 Presence of right artificial hip joint: Secondary | ICD-10-CM | POA: Diagnosis present

## 2019-11-19 DIAGNOSIS — R4182 Altered mental status, unspecified: Secondary | ICD-10-CM | POA: Diagnosis not present

## 2019-11-19 DIAGNOSIS — Z79899 Other long term (current) drug therapy: Secondary | ICD-10-CM

## 2019-11-19 DIAGNOSIS — Z9109 Other allergy status, other than to drugs and biological substances: Secondary | ICD-10-CM

## 2019-11-19 DIAGNOSIS — R0902 Hypoxemia: Secondary | ICD-10-CM | POA: Diagnosis not present

## 2019-11-19 DIAGNOSIS — F05 Delirium due to known physiological condition: Secondary | ICD-10-CM | POA: Diagnosis present

## 2019-11-19 DIAGNOSIS — R000 Tachycardia, unspecified: Secondary | ICD-10-CM | POA: Diagnosis not present

## 2019-11-19 DIAGNOSIS — Z91048 Other nonmedicinal substance allergy status: Secondary | ICD-10-CM

## 2019-11-19 DIAGNOSIS — I5032 Chronic diastolic (congestive) heart failure: Secondary | ICD-10-CM | POA: Diagnosis present

## 2019-11-19 DIAGNOSIS — I5030 Unspecified diastolic (congestive) heart failure: Secondary | ICD-10-CM | POA: Diagnosis not present

## 2019-11-19 DIAGNOSIS — Z87891 Personal history of nicotine dependence: Secondary | ICD-10-CM

## 2019-11-19 DIAGNOSIS — R001 Bradycardia, unspecified: Secondary | ICD-10-CM | POA: Diagnosis not present

## 2019-11-19 DIAGNOSIS — G9340 Encephalopathy, unspecified: Secondary | ICD-10-CM | POA: Diagnosis present

## 2019-11-19 DIAGNOSIS — Z9181 History of falling: Secondary | ICD-10-CM

## 2019-11-19 HISTORY — DX: Gastro-esophageal reflux disease without esophagitis: K21.9

## 2019-11-19 HISTORY — DX: Chronic kidney disease, unspecified: N18.9

## 2019-11-19 HISTORY — DX: Unspecified osteoarthritis, unspecified site: M19.90

## 2019-11-19 HISTORY — DX: Cardiac murmur, unspecified: R01.1

## 2019-11-19 LAB — COMPREHENSIVE METABOLIC PANEL
ALT: 11 U/L (ref 0–44)
AST: 17 U/L (ref 15–41)
Albumin: 2.8 g/dL — ABNORMAL LOW (ref 3.5–5.0)
Alkaline Phosphatase: 73 U/L (ref 38–126)
Anion gap: 10 (ref 5–15)
BUN: 26 mg/dL — ABNORMAL HIGH (ref 8–23)
CO2: 26 mmol/L (ref 22–32)
Calcium: 8.9 mg/dL (ref 8.9–10.3)
Chloride: 102 mmol/L (ref 98–111)
Creatinine, Ser: 1.68 mg/dL — ABNORMAL HIGH (ref 0.44–1.00)
GFR calc Af Amer: 29 mL/min — ABNORMAL LOW (ref >60)
GFR calc non Af Amer: 25 mL/min — ABNORMAL LOW (ref >60)
Glucose, Bld: 108 mg/dL — ABNORMAL HIGH (ref 70–99)
Potassium: 4.5 mmol/L (ref 3.5–5.1)
Sodium: 138 mmol/L (ref 135–145)
Total Bilirubin: 0.7 mg/dL (ref 0.3–1.2)
Total Protein: 6.5 g/dL (ref 6.5–8.1)

## 2019-11-19 LAB — CBC WITH DIFFERENTIAL/PLATELET
Abs Immature Granulocytes: 0.07 10*3/uL (ref 0.00–0.07)
Basophils Absolute: 0.1 10*3/uL (ref 0.0–0.1)
Basophils Relative: 1 %
Eosinophils Absolute: 0.2 10*3/uL (ref 0.0–0.5)
Eosinophils Relative: 2 %
HCT: 34.9 % — ABNORMAL LOW (ref 36.0–46.0)
Hemoglobin: 10.5 g/dL — ABNORMAL LOW (ref 12.0–15.0)
Immature Granulocytes: 1 %
Lymphocytes Relative: 12 %
Lymphs Abs: 1 10*3/uL (ref 0.7–4.0)
MCH: 20 pg — ABNORMAL LOW (ref 26.0–34.0)
MCHC: 30.1 g/dL (ref 30.0–36.0)
MCV: 66.5 fL — ABNORMAL LOW (ref 80.0–100.0)
Monocytes Absolute: 0.7 10*3/uL (ref 0.1–1.0)
Monocytes Relative: 8 %
Neutro Abs: 6.5 10*3/uL (ref 1.7–7.7)
Neutrophils Relative %: 76 %
Platelets: 331 10*3/uL (ref 150–400)
RBC: 5.25 MIL/uL — ABNORMAL HIGH (ref 3.87–5.11)
RDW: 15.9 % — ABNORMAL HIGH (ref 11.5–15.5)
WBC: 8.7 10*3/uL (ref 4.0–10.5)
nRBC: 0 % (ref 0.0–0.2)

## 2019-11-19 LAB — URINALYSIS, ROUTINE W REFLEX MICROSCOPIC
Bilirubin Urine: NEGATIVE
Glucose, UA: NEGATIVE mg/dL
Ketones, ur: NEGATIVE mg/dL
Nitrite: NEGATIVE
Protein, ur: NEGATIVE mg/dL
Specific Gravity, Urine: 1.006 (ref 1.005–1.030)
WBC, UA: >50 WBC/hpf — ABNORMAL HIGH (ref 0–5)
pH: 8 (ref 5.0–8.0)

## 2019-11-19 MED ORDER — TORSEMIDE 20 MG PO TABS
20.0000 mg | ORAL_TABLET | ORAL | Status: DC
  Administered 2019-11-19: 20 mg via ORAL
  Filled 2019-11-19: qty 1

## 2019-11-19 MED ORDER — ASPIRIN EC 325 MG PO TBEC
325.0000 mg | DELAYED_RELEASE_TABLET | Freq: Every day | ORAL | Status: DC
  Administered 2019-11-19 – 2019-11-23 (×4): 325 mg via ORAL
  Filled 2019-11-19 (×5): qty 1

## 2019-11-19 MED ORDER — TORSEMIDE 20 MG PO TABS
20.0000 mg | ORAL_TABLET | ORAL | Status: DC
  Administered 2019-11-20: 20 mg via ORAL
  Filled 2019-11-19: qty 1

## 2019-11-19 MED ORDER — ISOSORBIDE MONONITRATE ER 30 MG PO TB24
15.0000 mg | ORAL_TABLET | Freq: Every day | ORAL | Status: DC
  Administered 2019-11-20 – 2019-11-23 (×3): 15 mg via ORAL
  Filled 2019-11-19 (×4): qty 1

## 2019-11-19 MED ORDER — FUROSEMIDE 20 MG PO TABS: 40.0000 mg | ORAL_TABLET | ORAL | Status: DC

## 2019-11-19 MED ORDER — SODIUM CHLORIDE 0.9 % IV SOLN
1.0000 g | INTRAVENOUS | Status: DC
  Administered 2019-11-20 – 2019-11-21 (×2): 1 g via INTRAVENOUS
  Filled 2019-11-19 (×2): qty 10
  Filled 2019-11-19: qty 1

## 2019-11-19 MED ORDER — SODIUM CHLORIDE 0.9 % IV SOLN
1.0000 g | Freq: Once | INTRAVENOUS | Status: AC
  Administered 2019-11-19: 1 g via INTRAVENOUS
  Filled 2019-11-19: qty 10

## 2019-11-19 MED ORDER — SPIRONOLACTONE 25 MG PO TABS
25.0000 mg | ORAL_TABLET | ORAL | Status: DC
  Administered 2019-11-20: 25 mg via ORAL
  Filled 2019-11-19: qty 1

## 2019-11-19 MED ORDER — POLYETHYLENE GLYCOL 3350 17 G PO PACK: 17.0000 g | PACK | Freq: Every day | ORAL | Status: DC | PRN

## 2019-11-19 MED ORDER — ACETAMINOPHEN 325 MG PO TABS
650.0000 mg | ORAL_TABLET | Freq: Four times a day (QID) | ORAL | Status: DC | PRN
  Filled 2019-11-19: qty 2

## 2019-11-19 MED ORDER — HYDRALAZINE HCL 10 MG PO TABS
10.0000 mg | ORAL_TABLET | Freq: Two times a day (BID) | ORAL | Status: DC
  Administered 2019-11-20 – 2019-11-22 (×3): 10 mg via ORAL
  Filled 2019-11-19 (×7): qty 1

## 2019-11-19 MED ORDER — ACETAMINOPHEN 650 MG RE SUPP: 650.0000 mg | Freq: Four times a day (QID) | RECTAL | Status: DC | PRN

## 2019-11-19 MED ORDER — HEPARIN SODIUM (PORCINE) 5000 UNIT/ML IJ SOLN
5000.0000 [IU] | Freq: Three times a day (TID) | INTRAMUSCULAR | Status: DC
  Administered 2019-11-19 – 2019-11-23 (×10): 5000 [IU] via SUBCUTANEOUS
  Filled 2019-11-19 (×8): qty 1

## 2019-11-19 MED ORDER — METOPROLOL TARTRATE 25 MG PO TABS
25.0000 mg | ORAL_TABLET | Freq: Two times a day (BID) | ORAL | Status: DC
  Administered 2019-11-19 – 2019-11-23 (×5): 25 mg via ORAL
  Filled 2019-11-19 (×8): qty 1
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
## 2019-11-19 NOTE — ED Notes (Signed)
[phone] daughter please call as you can please
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
## 2019-11-19 NOTE — ED Provider Notes (Signed)
[facility] EMERGENCY DEPARTMENT Provider Note   CSN: [id number] Arrival date & time: 11/19/19  1505     History Chief Complaint  Patient presents with  . Altered Mental Status    Lori [name] is a 86 y.o. female.  The history is provided by the patient, a relative, medical records and the EMS personnel. The history is limited by the condition of the patient. No language interpreter was used.  Altered Mental Status Presenting symptoms: behavior changes, combativeness and confusion   Presenting symptoms: no lethargy, no partial responsiveness and no unresponsiveness   Severity:  Moderate Most recent episode:  More than 2 days ago Episode history:  Multiple Duration:  10 days Timing:  Intermittent Progression:  Waxing and waning Context: not dementia   Associated symptoms: agitation and hallucinations   Associated symptoms: no abdominal pain, no bladder incontinence, no depression, no difficulty breathing, no fever, no headaches, no light-headedness, no nausea, no palpitations, no seizures, no suicidal behavior, no visual change, no vomiting and no weakness        Past Medical History:  Diagnosis Date  . Anemia   . Asthma   . Cellulitis   . Chronic acquired lymphedema   . Chronic venous insufficiency   . Dysrhythmia   . Hyperlipidemia   . Hypertension     Patient Active Problem List   Diagnosis Date Noted  . Cellulitis 02/26/2018  . Chronic diastolic CHF (congestive heart failure) ([place]) 02/26/2018  . Chronic venous insufficiency 08/17/2017  . CKD (chronic kidney disease), stage IV ([place]) 08/17/2017  . Moderate mitral regurgitation 11/04/2016  . Elevated brain natriuretic peptide (BNP) level 11/04/2016  . Pulmonary HTN ([place]) 11/04/2016  . Hyponatremia 07/13/2012    Class: Acute  . Essential hypertension 03/31/2012  . Atrial fibrillation ([place]) 07/29/2011  . Peripheral edema 07/29/2011  . Cough 06/26/2010  . ANEMIA, IRON DEFICIENCY 06/10/2010   . ANEMIA-UNSPECIFIED 06/10/2010  . Asthma 06/10/2010  . PERSONAL HX COLONIC POLYPS 06/10/2010    Past Surgical History:  Procedure Laterality Date  . JOINT REPLACEMENT    . left total knee replacement    . right total hip replacement    . TONSILLECTOMY       OB History   No obstetric history on file.     Family History  Problem Relation Age of Onset  . Lung cancer Mother   . Other Father        unknown causes  . Breast cancer Neg Hx   . Diabetes Neg Hx     Social History   Tobacco Use  . Smoking status: Former Smoker    Quit date: 07/28/1959    Years since quitting: 60.3  . Smokeless tobacco: Never Used  Substance Use Topics  . Alcohol use: No  . Drug use: No    Home Medications Prior to Admission medications   Medication Sig Start Date End Date Taking? Authorizing Provider  dabigatran (PRADAXA) 75 MG CAPS capsule TAKE 1 CAPSULE EVERY 12 HOURS Patient taking differently: Take 75 mg by mouth 2 (two) times daily. TAKE 1 CAPSULE EVERY 12 HOURS 05/02/18   Evans Lance, MD  furosemide (LASIX) 20 MG tablet Take 40mg  PO daily, take an extra 20mg  Pill if your weight is over 2 pounds in 24hrs or your notice extra fluid in your legs. 11/07/16   Thurnell Lose, MD  metoprolol tartrate (LOPRESSOR) 25 MG tablet Take 1 tablet (25 mg total) by mouth 2 (two) times daily. 11/27/16  Evans Lance, MD  omeprazole (PRILOSEC) 40 MG capsule Take 40 mg by mouth 2 (two) times daily.     [provider]  Probiotic Product (ALIGN PO) Take 1 capsule by mouth in the morning.     [provider]  spironolactone (ALDACTONE) 25 MG tablet Take 25 mg by mouth every Monday, Wednesday, and Friday.     [provider]    Allergies    Dust mite extract, Mold extract [trichophyton mentagrophyte], Latex, and Tape  Review of Systems   Review of Systems  Constitutional: Negative for chills, diaphoresis, fatigue and fever.  HENT: Negative for congestion.   Eyes:  Negative for photophobia and visual disturbance.  Respiratory: Negative for cough, chest tightness, shortness of breath and wheezing.   Cardiovascular: Negative for palpitations.  Gastrointestinal: Negative for abdominal pain, constipation, diarrhea, nausea and vomiting.  Genitourinary: Negative for bladder incontinence.       Foley in place  Musculoskeletal: Negative for back pain, neck pain and neck stiffness.  Skin: Negative for wound.  Neurological: Negative for seizures, speech difficulty, weakness, light-headedness and headaches.  Psychiatric/Behavioral: Positive for agitation, behavioral problems, confusion and hallucinations. Negative for suicidal ideas. The patient is not nervous/anxious.   All other systems reviewed and are negative.   Physical Exam Updated Vital Signs BP 132/71 (BP Location: Right Arm)   Pulse (!) 110   Temp 97.9 F (36.6 C) (Oral)   Resp 19   SpO2 94%   Physical Exam Vitals and nursing note reviewed.  Constitutional:      General: She is not in acute distress.    Appearance: She is well-developed. She is not ill-appearing, toxic-appearing or diaphoretic.  HENT:     Head: Normocephalic and atraumatic.     Right Ear: External ear normal.     Left Ear: External ear normal.     Nose: Nose normal.     Mouth/Throat:     Mouth: Mucous membranes are moist.     Pharynx: No oropharyngeal exudate or posterior oropharyngeal erythema.  Eyes:     Extraocular Movements: Extraocular movements intact.     Conjunctiva/sclera: Conjunctivae normal.     Pupils: Pupils are equal, round, and reactive to light.  Cardiovascular:     Rate and Rhythm: Normal rate.     Pulses: Normal pulses.     Heart sounds: Murmur present.  Pulmonary:     Effort: Pulmonary effort is normal. No respiratory distress.     Breath sounds: Normal breath sounds. No stridor. No wheezing, rhonchi or rales.  Chest:     Chest wall: No tenderness.  Abdominal:     General: Abdomen is flat.  There is no distension.     Tenderness: There is no abdominal tenderness. There is no right CVA tenderness, left CVA tenderness or rebound.  Musculoskeletal:        General: No tenderness.     Cervical back: Normal range of motion and neck supple. No tenderness.     Right lower leg: No edema.     Left lower leg: No edema.  Skin:    General: Skin is warm.     Capillary Refill: Capillary refill takes less than 2 seconds.     Findings: No erythema or rash.  Neurological:     General: No focal deficit present.     Mental Status: She is alert and oriented to person, place, and time.     Sensory: No sensory deficit.     Motor: No  weakness or abnormal muscle tone.     Deep Tendon Reflexes: Reflexes are normal and symmetric.  Psychiatric:        Attention and Perception: She perceives visual hallucinations.        Mood and Affect: Affect is not angry.        Behavior: Agitated: at home. Behavior is cooperative.        Thought Content: Thought content is paranoid and delusional. Thought content does not include homicidal or suicidal ideation. Thought content does not include homicidal or suicidal plan.     ED Results / Procedures / Treatments   Labs (all labs ordered are listed, but only abnormal results are displayed) Labs Reviewed  CBC WITH DIFFERENTIAL/PLATELET - Abnormal; Notable for the following components:      Result Value   RBC 5.25 (*)    Hemoglobin 10.5 (*)    HCT 34.9 (*)    MCV 66.5 (*)    MCH 20.0 (*)    RDW 15.9 (*)    All other components within normal limits  COMPREHENSIVE METABOLIC PANEL - Abnormal; Notable for the following components:   Glucose, Bld 108 (*)    BUN 26 (*)    Creatinine, Ser 1.68 (*)    Albumin 2.8 (*)    GFR calc non Af Amer 25 (*)    GFR calc Af Amer 29 (*)    All other components within normal limits  URINALYSIS, ROUTINE W REFLEX MICROSCOPIC - Abnormal; Notable for the following components:   APPearance HAZY (*)    Hgb urine dipstick  MODERATE (*)    Leukocytes,Ua LARGE (*)    WBC, UA >50 (*)    Bacteria, UA RARE (*)    All other components within normal limits  URINE CULTURE  TSH  BRAIN NATRIURETIC PEPTIDE  BASIC METABOLIC PANEL  CBC  MAGNESIUM    EKG EKG Interpretation  Date/Time:  Sunday Nov 19 2019 09:17:57 EDT Ventricular Rate:  118 PR Interval:    QRS Duration: 77 QT Interval:  347 QTC Calculation: 487 R Axis:   168 Text Interpretation: Atrial fibrillation Lateral infarct, old Probable anteroseptal infarct, old When compared to prior,  faster rate. No STEMI Confirmed by Antony Blackbird [phone]) on 11/19/2019 9:38:05 AM   Radiology DG Chest 2 View  Result Date: 11/19/2019 CLINICAL DATA:  Transient delirium, agitation. Rule out infection. EXAM: CHEST - 2 VIEW COMPARISON:  Chest x-rays dated 09/20/2019 and 02/25/2018. FINDINGS: Stable hazy opacity at the RIGHT lung base. Stable denser opacity at the LEFT lung base. Coarse interstitial markings are again seen throughout both lungs suggesting chronic interstitial lung disease. Stable mild cardiomegaly. No acute appearing osseous abnormality. IMPRESSION: 1. No significant change compared to most recent chest x-ray of 09/20/2019. Stable bibasilar opacities, at least some component due to small bilateral pleural effusions. Superimposed atelectasis is likely. 2. Stable mild cardiomegaly. 3. Probable chronic interstitial lung disease. Electronically Signed   By: Franki Cabot M.D.   On: 11/19/2019 10:48    Procedures Procedures (including critical care time)  Medications Ordered in ED Medications  metoprolol tartrate (LOPRESSOR) tablet 25 mg (has no administration in time range)  spironolactone (ALDACTONE) tablet 25 mg (has no administration in time range)  aspirin EC tablet 325 mg (325 mg Oral Given 11/19/19 1630)  heparin injection 5,000 Units (5,000 Units Subcutaneous Given 11/19/19 1630)  acetaminophen (TYLENOL) tablet 650 mg (has no administration in time  range)    Or  acetaminophen (TYLENOL) suppository 650 mg (has  no administration in time range)  polyethylene glycol (MIRALAX / GLYCOLAX) packet 17 g (has no administration in time range)  cefTRIAXone (ROCEPHIN) 1 g in sodium chloride 0.9 % 100 mL IVPB (1 g Intravenous Not Given 11/19/19 1615)  hydrALAZINE (APRESOLINE) tablet 10 mg (has no administration in time range)  isosorbide mononitrate (IMDUR) 24 hr tablet 15 mg (has no administration in time range)  torsemide (DEMADEX) tablet 20 mg (20 mg Oral Not Given 11/19/19 2123)  cefTRIAXone (ROCEPHIN) 1 g in sodium chloride 0.9 % 100 mL IVPB (0 g Intravenous Stopped 11/19/19 1357)    ED Course  I have reviewed the triage vital signs and the nursing notes.  Pertinent labs & imaging results that were available during my care of the patient were reviewed by me and considered in my medical decision making (see chart for details).    MDM Rules/Calculators/A&P                      GIMENA BUICK is a 86 y.o. female with a past medical history significant for hospice care, CKD, CHF, hypertension, atrial fibrillation, chronic anemia, chronic venous insufficiency, and recent leg cellulitis who presents for agitation and altered mental status with concern for delirium.  According to EMS report to nursing, and patient's report, she has been feeling confused for the last 10 days.  She says that a caregiver is trying to hurt her and is trapping her "in a crib" and will not let her move.  She reports that she called 911 try to get help tonight because she thinks that people are trying to hurt her.  She does say that she is seeing things other people do not see including seeing law enforcement at times.  She is very paranoid and agrees that she is not feeling her normal self.  She otherwise denies pain and feels that her cellulitis in her leg is much improved.  She has no pains at this time including no headache, neck pain, neck stiffness, fevers, chills, chest  pain, shortness of breath, cough, urinary symptoms or GI symptoms.  She does agree with [place] doing a work-up to look for infectious causes of her delirium at this time.  She denies any plans to hurt herself or others but does feel that she started yelling at the caregiver when she felt threatened and that she was in danger.  On exam, patient is extremely pleasant and has no complaints.  She does report that she does not feel safe at home with the caregiver who she thinks wants to hurt her.  She had clear breath sounds and did hear a murmur.  Abdomen and chest were nontender.  She moving all extremities.  No lower extremity erythema seen.  No significant lower extremity tenderness.  Patient resting comfortably but was tachycardic on my evaluation.  We will get labs and x-ray and urinalysis to look for infection.  Given lack of head injury, headache, or complaint of trauma, will hold on any head imaging at this time.  Anticipate reassessment after work-up and will also likely try to call family to get further information.    Had a long conversation with the patient's son who was able to provide further information.  They report that over the last 2 weeks, patient has had an acute change in her mental status with these delirious episodes, paranoia, and delusions.  This is not her normal self.  They agree that she has not had any trauma and  has had an indwelling Foley for some time.  They are concerned that it could be a UTI causing her symptoms.  They agree she has not been having significant coughing, fevers, or chills.  Work-up began to return and patient does have new bacteria, white blood cells, and large leukocytes.  There are no nitrites seen.  New haziness as well.  Other labs similar to prior.  Chest x-ray shows no pneumonia.  Clinically suspect patient has a urinary tract infection contributing to the acute mental status changes.  The family does not feel safe for the patient going back to her  apartment by herself as she has been calling law enforcement about these paranoid delusions.  I suspect this is related to delirium and UTI.  Will order antibiotics and will call for admission for further monitoring and management.  If patient's symptoms continue or progress despite treatment of UTI, anticipate consultation with psychiatry for further medication management.   Final Clinical Impression(s) / ED Diagnoses Final diagnoses:  Altered mental status, unspecified altered mental status type    Rx / DC Orders ED Discharge Orders    None     Clinical Impression: 1. Altered mental status, unspecified altered mental status type     Disposition: Admit  This note was prepared with assistance of Dragon voice recognition software. Occasional wrong-word or sound-a-like substitutions may have occurred due to the inherent limitations of voice recognition software.      Malaky Tetrault, Gwenyth Allegra, MD 11/19/19 2147

## 2019-11-20 DIAGNOSIS — L03116 Cellulitis of left lower limb: Secondary | ICD-10-CM | POA: Diagnosis not present

## 2019-11-20 DIAGNOSIS — R296 Repeated falls: Secondary | ICD-10-CM | POA: Diagnosis present

## 2019-11-20 DIAGNOSIS — Z9181 History of falling: Secondary | ICD-10-CM | POA: Diagnosis not present

## 2019-11-20 DIAGNOSIS — G9341 Metabolic encephalopathy: Secondary | ICD-10-CM | POA: Diagnosis present

## 2019-11-20 DIAGNOSIS — Z20822 Contact with and (suspected) exposure to covid-19: Secondary | ICD-10-CM | POA: Diagnosis present

## 2019-11-20 DIAGNOSIS — I340 Nonrheumatic mitral (valve) insufficiency: Secondary | ICD-10-CM | POA: Diagnosis present

## 2019-11-20 DIAGNOSIS — J9811 Atelectasis: Secondary | ICD-10-CM | POA: Diagnosis present

## 2019-11-20 DIAGNOSIS — I10 Essential (primary) hypertension: Secondary | ICD-10-CM | POA: Diagnosis not present

## 2019-11-20 DIAGNOSIS — G9340 Encephalopathy, unspecified: Secondary | ICD-10-CM

## 2019-11-20 DIAGNOSIS — I4891 Unspecified atrial fibrillation: Secondary | ICD-10-CM | POA: Diagnosis not present

## 2019-11-20 DIAGNOSIS — H3530 Unspecified macular degeneration: Secondary | ICD-10-CM | POA: Diagnosis present

## 2019-11-20 DIAGNOSIS — L03115 Cellulitis of right lower limb: Secondary | ICD-10-CM | POA: Diagnosis not present

## 2019-11-20 DIAGNOSIS — I890 Lymphedema, not elsewhere classified: Secondary | ICD-10-CM | POA: Diagnosis present

## 2019-11-20 DIAGNOSIS — I872 Venous insufficiency (chronic) (peripheral): Secondary | ICD-10-CM | POA: Diagnosis present

## 2019-11-20 DIAGNOSIS — R339 Retention of urine, unspecified: Secondary | ICD-10-CM | POA: Diagnosis present

## 2019-11-20 DIAGNOSIS — N390 Urinary tract infection, site not specified: Secondary | ICD-10-CM

## 2019-11-20 DIAGNOSIS — E8809 Other disorders of plasma-protein metabolism, not elsewhere classified: Secondary | ICD-10-CM | POA: Diagnosis present

## 2019-11-20 DIAGNOSIS — M6281 Muscle weakness (generalized): Secondary | ICD-10-CM | POA: Diagnosis not present

## 2019-11-20 DIAGNOSIS — N184 Chronic kidney disease, stage 4 (severe): Secondary | ICD-10-CM | POA: Diagnosis present

## 2019-11-20 DIAGNOSIS — Z7401 Bed confinement status: Secondary | ICD-10-CM | POA: Diagnosis not present

## 2019-11-20 DIAGNOSIS — I5032 Chronic diastolic (congestive) heart failure: Secondary | ICD-10-CM | POA: Diagnosis present

## 2019-11-20 DIAGNOSIS — Z209 Contact with and (suspected) exposure to unspecified communicable disease: Secondary | ICD-10-CM | POA: Diagnosis not present

## 2019-11-20 DIAGNOSIS — R32 Unspecified urinary incontinence: Secondary | ICD-10-CM | POA: Diagnosis present

## 2019-11-20 DIAGNOSIS — R54 Age-related physical debility: Secondary | ICD-10-CM | POA: Diagnosis present

## 2019-11-20 DIAGNOSIS — R4182 Altered mental status, unspecified: Secondary | ICD-10-CM | POA: Diagnosis not present

## 2019-11-20 DIAGNOSIS — B9689 Other specified bacterial agents as the cause of diseases classified elsewhere: Secondary | ICD-10-CM | POA: Diagnosis present

## 2019-11-20 DIAGNOSIS — T83511A Infection and inflammatory reaction due to indwelling urethral catheter, initial encounter: Secondary | ICD-10-CM | POA: Diagnosis present

## 2019-11-20 DIAGNOSIS — H9190 Unspecified hearing loss, unspecified ear: Secondary | ICD-10-CM | POA: Diagnosis present

## 2019-11-20 DIAGNOSIS — M2550 Pain in unspecified joint: Secondary | ICD-10-CM | POA: Diagnosis not present

## 2019-11-20 DIAGNOSIS — R1084 Generalized abdominal pain: Secondary | ICD-10-CM | POA: Diagnosis not present

## 2019-11-20 DIAGNOSIS — F05 Delirium due to known physiological condition: Secondary | ICD-10-CM | POA: Diagnosis present

## 2019-11-20 DIAGNOSIS — I480 Paroxysmal atrial fibrillation: Secondary | ICD-10-CM | POA: Diagnosis present

## 2019-11-20 DIAGNOSIS — Z66 Do not resuscitate: Secondary | ICD-10-CM | POA: Diagnosis present

## 2019-11-20 DIAGNOSIS — L0390 Cellulitis, unspecified: Secondary | ICD-10-CM | POA: Diagnosis present

## 2019-11-20 DIAGNOSIS — I5030 Unspecified diastolic (congestive) heart failure: Secondary | ICD-10-CM | POA: Diagnosis not present

## 2019-11-20 DIAGNOSIS — I130 Hypertensive heart and chronic kidney disease with heart failure and stage 1 through stage 4 chronic kidney disease, or unspecified chronic kidney disease: Secondary | ICD-10-CM | POA: Diagnosis present

## 2019-11-20 DIAGNOSIS — R52 Pain, unspecified: Secondary | ICD-10-CM | POA: Diagnosis not present

## 2019-11-20 DIAGNOSIS — J45909 Unspecified asthma, uncomplicated: Secondary | ICD-10-CM | POA: Diagnosis present

## 2019-11-20 LAB — BASIC METABOLIC PANEL
Anion gap: 9 (ref 5–15)
BUN: 26 mg/dL — ABNORMAL HIGH (ref 8–23)
CO2: 27 mmol/L (ref 22–32)
Calcium: 9 mg/dL (ref 8.9–10.3)
Chloride: 104 mmol/L (ref 98–111)
Creatinine, Ser: 1.77 mg/dL — ABNORMAL HIGH (ref 0.44–1.00)
GFR calc Af Amer: 27 mL/min — ABNORMAL LOW (ref >60)
GFR calc non Af Amer: 23 mL/min — ABNORMAL LOW (ref >60)
Glucose, Bld: 90 mg/dL (ref 70–99)
Potassium: 4.6 mmol/L (ref 3.5–5.1)
Sodium: 140 mmol/L (ref 135–145)

## 2019-11-20 LAB — CBC
HCT: 36.8 % (ref 36.0–46.0)
Hemoglobin: 10.9 g/dL — ABNORMAL LOW (ref 12.0–15.0)
MCH: 19.8 pg — ABNORMAL LOW (ref 26.0–34.0)
MCHC: 29.6 g/dL — ABNORMAL LOW (ref 30.0–36.0)
MCV: 66.8 fL — ABNORMAL LOW (ref 80.0–100.0)
Platelets: 337 10*3/uL (ref 150–400)
RBC: 5.51 MIL/uL — ABNORMAL HIGH (ref 3.87–5.11)
RDW: 16.1 % — ABNORMAL HIGH (ref 11.5–15.5)
WBC: 8.5 10*3/uL (ref 4.0–10.5)
nRBC: 0 % (ref 0.0–0.2)

## 2019-11-20 LAB — TSH: TSH: 0.996 u[IU]/mL (ref 0.350–4.500)

## 2019-11-20 LAB — MAGNESIUM: Magnesium: 1.8 mg/dL (ref 1.7–2.4)

## 2019-11-20 LAB — BRAIN NATRIURETIC PEPTIDE: B Natriuretic Peptide: 654 pg/mL — ABNORMAL HIGH (ref 0.0–100.0)

## 2019-11-20 MED ORDER — QUETIAPINE FUMARATE 25 MG PO TABS
12.5000 mg | ORAL_TABLET | Freq: Every day | ORAL | Status: DC
  Administered 2019-11-20 – 2019-11-22 (×3): 12.5 mg via ORAL
  Filled 2019-11-20 (×3): qty 1

## 2019-11-20 MED ORDER — MELATONIN 5 MG PO TABS
5.0000 mg | ORAL_TABLET | Freq: Every day | ORAL | Status: DC
  Administered 2019-11-20 – 2019-11-22 (×3): 5 mg via ORAL
  Filled 2019-11-20 (×3): qty 1

## 2019-11-20 NOTE — Progress Notes (Signed)
New Admission Note:   Arrival Method:  Via stretcher from the ED Mental Orientation:  A & O x 2 -3 (unclear of day and unsure why she is in hospital) Telemetry: Box #16 - AFIB Assessment: Completed Skin:  MASD, Lt great toe red (as if stumped her toe), Healed pressure wound to Lt buttock, anal fissure IV:  LAFA NSL Pain:  Denies Tubes:  None Safety Measures: Safety Fall Prevention Plan has been given, discussed and signed Admission: Completed 5 MWt Orientation: Patient has been orientated to the room, unit and staff.  Family:  None at bedside  Due to age (86 years old), multiple falls, and confusion, patient was placed on low bed.  Clothes came with patient.  No glasses or dentures.  She does have bilateral hearing aids.  Orders have been reviewed and implemented. Will continue to monitor the patient. Call light has been placed within reach and bed alarm has been activated.   Earleen Reaper RN- BC, [employer]

## 2019-11-20 NOTE — Evaluation (Signed)
Physical Therapy Evaluation Patient Details Name: DAISA STENNIS MRN: [id number] DOB: 01-19-1919 Today's Date: 11/20/2019   History of Present Illness  NANCI LAKATOS is a 86 y.o. female with macular degeneration, HTN, Afib/DHF, moderate MR, chronic LE edema attributed to DHF/lymphedema/venous insufficiency with recurrent cellulitis and frequent falls who comes in through EMS for episodes of delusion/paranoia.  Clinical Impression   Pt admitted with above diagnosis. Comes from home where she was living alone with assist of family. Pt currently limited by decreased safety awareness, decreased acitivity tolerance, decr functional mobility, and decreased ability to care for self. Pt currently with functional limitations due to the deficits listed below (see PT Problem List). Pt will benefit from skilled PT to increase their independence and safety with mobility to allow discharge to the venue listed below.       Follow Up Recommendations SNF    Equipment Recommendations  None recommended by PT    Recommendations for Other Services       Precautions / Restrictions Precautions Precautions: Fall Restrictions Weight Bearing Restrictions: No      Mobility  Bed Mobility            General bed mobility comments: up in recliner  Transfers Overall transfer level: Needs assistance Equipment used: 1 person hand held assist Transfers: Stand Pivot Transfers Sit to Stand: Min guard Stand pivot transfers: Min assist       General transfer comment: Cues for hand placement and safety; Heavy dependence on UEs for support and balance  Ambulation/Gait                Stairs            Wheelchair Mobility    Modified Rankin (Stroke Patients Only)       Balance Overall balance assessment: Mild deficits observed, not formally tested                                           Pertinent Vitals/Pain Pain Assessment: No/denies pain    Home Living  Family/patient expects to be discharged to:: Private residence Living Arrangements: Alone(Has aids come into her home) Available Help at Discharge: Family;Available PRN/intermittently Type of Home: Apartment Home Access: Level entry     Home Layout: One level Home Equipment: Walker - 4 wheels;Grab bars - tub/shower Additional Comments: granddaughter  assists as able    Prior Function Level of Independence: Needs assistance   Gait / Transfers Assistance Needed: Mod I in apt using 4 wheeled RW  ADL's / Homemaking Assistance Needed: had cleaning services and assist with bathing        Hand Dominance   Dominant Hand: Right    Extremity/Trunk Assessment   Upper Extremity Assessment Upper Extremity Assessment: Overall WFL for tasks assessed    Lower Extremity Assessment Lower Extremity Assessment: Generalized weakness    Cervical / Trunk Assessment Cervical / Trunk Assessment: Kyphotic  Communication   Communication: HOH  Cognition Arousal/Alertness: Awake/alert Behavior During Therapy: WFL for tasks assessed/performed;Restless(slightly restless) Overall Cognitive Status: No family/caregiver present to determine baseline cognitive functioning Area of Impairment: Memory;Safety/judgement;Problem solving                         Safety/Judgement: Decreased awareness of safety;Decreased awareness of deficits   Problem Solving: Difficulty sequencing;Requires verbal cues;Requires tactile cues General Comments: Pt having difficulty with unfamiliar  2WW even when instructed that it is very close to rollator for use. Pt very HOH and talking to self in room upon OTR arrival pointing and saying "I agree" when one else was in the room.      General Comments General comments (skin integrity, edema, etc.): Seemed happy to be OOB in recliner    Exercises     Assessment/Plan    PT Assessment Patient needs continued PT services  PT Problem List Decreased strength;Decreased  activity tolerance;Decreased balance;Decreased mobility;Decreased coordination;Decreased cognition;Decreased knowledge of use of DME;Decreased safety awareness;Decreased knowledge of precautions       PT Treatment Interventions DME instruction;Gait training;Functional mobility training;Therapeutic activities;Therapeutic exercise;Balance training;Cognitive remediation;Patient/family education    PT Goals (Current goals can be found in the Care Plan section)  Acute Rehab PT Goals Patient Stated Goal: to get  better PT Goal Formulation: Patient unable to participate in goal setting Time For Goal Achievement: 12/04/19 Potential to Achieve Goals: Good    Frequency Min 2X/week   Barriers to discharge        Co-evaluation               AM-PAC PT "6 Clicks" Mobility  Outcome Measure Help needed turning from your back to your side while in a flat bed without using bedrails?: None Help needed moving from lying on your back to sitting on the side of a flat bed without using bedrails?: A Little Help needed moving to and from a bed to a chair (including a wheelchair)?: A Little Help needed standing up from a chair using your arms (e.g., wheelchair or bedside chair)?: A Little Help needed to walk in hospital room?: A Little Help needed climbing 3-5 steps with a railing? : Total 6 Click Score: 17    End of Session Equipment Utilized During Treatment: Gait belt Activity Tolerance: Patient tolerated treatment well Patient left: in chair;with call bell/phone within reach;with chair alarm set Nurse Communication: Mobility status PT Visit Diagnosis: Unsteadiness on feet (R26.81);Other abnormalities of gait and mobility (R26.89);Muscle weakness (generalized) (M62.81)    Time: 5035-4656 PT Time Calculation (min) (ACUTE ONLY): 18 min   Charges:   PT Evaluation $PT Eval Moderate Complexity: 1 Mod          Roney Marion, [place]  Acute Rehabilitation Services Pager [phone] Office  [phone]   Colletta [place] 11/20/2019, 4:18 PM

## 2019-11-20 NOTE — Progress Notes (Addendum)
[facility] Hospitalized patient visit.  [name] is a current hospice patient with terminal diagnosis of Diastolic heart failure. Family activated EMS after patient became confused and combative. The family contacted hospice prior to contacting EMS but declined to wait for hospice to visit patient in the home. Patient was admitted to [facility] on 11/19/19 with a diagnosis of altered mental status and suspected UTI. Per Dr. Karie Georges with [facility] this is a related hospital admission.   Visited patient at bedside. She is alert and oriented, pleasant. No visitors present. Eating about 50% of meals. No complaints of pain.  VS: 97.4, 95/49, 85, 18, 91% RA I/O:  340/200  Abnormal labs: BUN 26, Creatinine 1.77, GFR 27, RBC 5.51, Hgb 10.9, MCV 66.8, MCH 19.8, MCHC 29.6, RDW 16.1, urine culture positive  CXR:  IMPRESSION: 1. No significant change compared to most recent chest x-ray of 09/20/2019. Stable bibasilar opacities, at least some component due to small bilateral pleural effusions. Superimposed atelectasis is likely. 2. Stable mild cardiomegaly. 3. Probable chronic interstitial lung disease.  IV/PRN Medications: Rocephin 1gm IVPB QD, No PRNs given.   Problem List: #Delirium/acute metabolic encephalopathy -From suspected UTI.  Awaiting cultures. #Chronic DHF/RV failure/moderate MR #Hypoalbuminemia 2.8  Discharge Planning: Currently looking at LTC placement.  Family contact: No family, caregiver/friend communicated with.   IDG: updated.  Goals of care: DNR.   Medication list and transfer summary  placed on [facility] chart.   Should patient need ambulance transport at discharge- please use [facility] as they contract this service for our active hospice patients.   Farrel Gordon, RN, [facility] (Liaison [place] on [place]) [phone]

## 2019-11-20 NOTE — Evaluation (Signed)
Occupational Therapy Evaluation Patient Details Name: Lori [name] MRN: [id number] DOB: 1918/08/20 Today's Date: 11/20/2019    History of Present Illness Lori [name] is a 86 y.o. female with macular degeneration, HTN, Afib/DHF, moderate MR, chronic LE edema attributed to DHF/lymphedema/venous insufficiency with recurrent cellulitis and frequent falls who comes in through EMS for episodes of delusion/paranoia.   Clinical Impression   Pt PTA: Pt was living alone with assist of family. Pt currently limited by decreased safety awareness, decreased acitivity tolerance and decreased ability to care for self. Pt requiring minguardA overall for ADL tasks. Pt showing ability to maneuver to perform LB dressing with bending over and figure 4 technique. Pt HOH so required short cues for commands to be successful. O2 >90% on RA and HR <115 BPM with exertion. Pt would benefit from continued OT skilled services in [facility] setting (home first program) vs SNF setting depending on supervision level at home due to cognitive deficits and lack of safety awareness at times. OT following acutely.       Follow Up Recommendations  Home health OT;Supervision/Assistance - 24 hour;SNF(HH with 24/7 at home (home 1st) 2* memory deficits vs SNF)    Equipment Recommendations  Other (comment);3 in 1 bedside commode(defer to next facility)    Recommendations for Other Services       Precautions / Restrictions Precautions Precautions: Fall Restrictions Weight Bearing Restrictions: No      Mobility Bed Mobility Overal bed mobility: Needs Assistance Bed Mobility: Supine to Sit     Supine to sit: Min assist     General bed mobility comments: up in recliner  Transfers Overall transfer level: Needs assistance Equipment used: Rolling walker (2 wheeled) Transfers: Sit to/from [facility] Sit to Stand: Min guard Stand pivot transfers: Min guard       General transfer comment: cues for  hand placement; pt able to walk backwards and pivot into BSC and recliner with minguardA. TActile cues for hand placement at times    Balance Overall balance assessment: Mild deficits observed, not formally tested                                         ADL either performed or assessed with clinical judgement   ADL Overall ADL's : Needs assistance/impaired Eating/Feeding: Modified independent   Grooming: Supervision/safety;Standing Grooming Details (indicate cue type and reason): stood at sink to wash hands, face and comb hair Upper Body Bathing: Supervision/ safety;Standing   Lower Body Bathing: Min guard;Sitting/lateral leans;Sit to/from stand   Upper Body Dressing : Supervision/safety;Sitting   Lower Body Dressing: Min guard;Sitting/lateral leans;Sit to/from stand   Toilet Transfer: Min guard;Ambulation;Cueing for safety   Toileting- Clothing Manipulation and Hygiene: Min guard;Cueing for safety;Sitting/lateral lean;Sit to/from stand Toileting - Clothing Manipulation Details (indicate cue type and reason): Pt standing for task at [facility] in bathroom     Functional mobility during ADLs: Min guard;Rolling walker;Cueing for sequencing General ADL Comments: Pt requiring minguardA overall for ADL tasks. Pt showing ability to maneuver to perform LB dressing with bending over and figure 4 technique. Pt HOH so required short cues for commands to be successful.     Vision Baseline Vision/History: Macular Degeneration("I get shots in my eyes every 3 weeks.") Patient Visual Report: No change from baseline Vision Assessment?: No apparent visual deficits Additional Comments: Pt able to maneuver in room to avoid obstacles with cues  Perception     Praxis      Pertinent Vitals/Pain Pain Assessment: No/denies pain     Hand Dominance Right   Extremity/Trunk Assessment Upper Extremity Assessment Upper Extremity Assessment: Overall WFL for tasks assessed   Lower  Extremity Assessment Lower Extremity Assessment: Generalized weakness   Cervical / Trunk Assessment Cervical / Trunk Assessment: Kyphotic   Communication Communication Communication: HOH   Cognition Arousal/Alertness: Awake/alert Behavior During Therapy: WFL for tasks assessed/performed;Restless(slightly restless) Overall Cognitive Status: No family/caregiver present to determine baseline cognitive functioning Area of Impairment: Memory;Safety/judgement;Problem solving                         Safety/Judgement: Decreased awareness of safety;Decreased awareness of deficits   Problem Solving: Difficulty sequencing;Requires verbal cues;Requires tactile cues General Comments: Pt having difficulty with unfamiliar 2WW even when instructed that it is very close to rollator for use. Pt very HOH and talking to self in room upon OTR arrival pointing and saying "I agree" when one else was in the room.   General Comments  O2 >90% on RA and HR <115 BPM with exertion    Exercises     Shoulder Instructions      Home Living Family/patient expects to be discharged to:: Private residence Living Arrangements: Alone(Has aids come into her home) Available Help at Discharge: Family;Available PRN/intermittently Type of Home: Apartment Home Access: Level entry     Home Layout: One level     Bathroom Shower/Tub: Teacher, early years/pre: Standard     Home Equipment: Walker - 4 wheels;Grab bars - tub/shower   Additional Comments: granddaughter  assists as able      Prior Functioning/Environment Level of Independence: Needs assistance  Gait / Transfers Assistance Needed: Mod I in apt using 4 wheeled RW ADL's / Homemaking Assistance Needed: had cleaning services and assist with bathing Communication / Swallowing Assistance Needed: HOH          OT Problem List: Decreased activity tolerance;Impaired balance (sitting and/or standing);Decreased safety awareness;Decreased  cognition;Impaired UE functional use;Decreased knowledge of use of DME or AE;Increased edema      OT Treatment/Interventions: Self-care/ADL training;Therapeutic exercise;DME and/or AE instruction;Therapeutic activities;Cognitive remediation/compensation;Patient/family education;Balance training    OT Goals(Current goals can be found in the care plan section) Acute Rehab OT Goals Patient Stated Goal: to get  better OT Goal Formulation: With patient Time For Goal Achievement: 12/04/19 Potential to Achieve Goals: Good ADL Goals Pt Will Perform Lower Body Dressing: (P) with modified independence;sitting/lateral leans;sit to/from stand Pt Will Transfer to Toilet: (P) with modified independence;ambulating Additional ADL Goal #1: (P) Pt will state 2 ways to prevent falls in order to increase independence. Additional ADL Goal #2: (P) Pt will sequence through ADL rotutine tasks with <1 verbal cue with no cues to attend to task  OT Frequency: Min 2X/week   Barriers to D/C: Decreased caregiver support  requires 24/7 care       Co-evaluation              AM-PAC OT "6 Clicks" Daily Activity     Outcome Measure Help from another person eating meals?: None Help from another person taking care of personal grooming?: A Little Help from another person toileting, which includes using toliet, bedpan, or urinal?: A Little Help from another person bathing (including washing, rinsing, drying)?: A Little Help from another person to put on and taking off regular upper body clothing?: A Little Help from another person to  put on and taking off regular lower body clothing?: A Little 6 Click Score: 19   End of Session Equipment Utilized During Treatment: Gait belt;Rolling walker Nurse Communication: Mobility status  Activity Tolerance: Patient tolerated treatment well Patient left: in chair;with call bell/phone within reach;with chair alarm set  OT Visit Diagnosis: Unsteadiness on feet  (R26.81);Muscle weakness (generalized) (M62.81);Other symptoms and signs involving cognitive function                Time: 1300-1336 OT Time Calculation (min): 36 min Charges:  OT General Charges $OT Visit: 1 Visit OT Evaluation $OT Eval Moderate Complexity: 1 Mod OT Treatments $Self Care/Home Management : 8-22 mins  Jefferey Pica, OTR/L Acute Rehabilitation Services Pager: [phone] Office: [phone]   Ilena Dieckman C 11/20/2019, 3:36 PM

## 2019-11-20 NOTE — NC FL2 (Signed)
[place] LEVEL OF CARE SCREENING TOOL     IDENTIFICATION  Patient Name: Lori [name] Birthdate: [birth year] Sex: female Admission Date (Current Location): 11/19/2019  [facility] and [place] Number:  Herbalist and Address:  [facility], [place] [address] [place], [place] [address]      Provider Number: [id number]  Attending Physician Name and Address:  [place] Eric J, DO  Relative Name and Phone Number:       Current Level of Care: Hospital Recommended Level of Care: [place] Prior Approval Number:    Date Approved/Denied: 11/11/16 PASRR Number: [id number]  Discharge Plan: SNF    Current Diagnoses: Patient Active Problem List   Diagnosis Date Noted  . Acute lower UTI 11/19/2019  . Acute metabolic encephalopathy 54/62/7035  . Encephalopathy acute 11/19/2019  . Cellulitis 02/26/2018  . Chronic diastolic CHF (congestive heart failure) ([place]) 02/26/2018  . Chronic venous insufficiency 08/17/2017  . CKD (chronic kidney disease), stage IV ([place]) 08/17/2017  . Moderate mitral regurgitation 11/04/2016  . Elevated brain natriuretic peptide (BNP) level 11/04/2016  . Pulmonary HTN ([place]) 11/04/2016  . Hyponatremia 07/13/2012  . Essential hypertension 03/31/2012  . Atrial fibrillation ([place]) 07/29/2011  . Peripheral edema 07/29/2011  . Cough 06/26/2010  . ANEMIA, IRON DEFICIENCY 06/10/2010  . ANEMIA-UNSPECIFIED 06/10/2010  . Asthma 06/10/2010  . PERSONAL HX COLONIC POLYPS 06/10/2010    Orientation RESPIRATION BLADDER Height & Weight     Self, Time, Place  Normal Incontinent Weight:   Height:     BEHAVIORAL SYMPTOMS/MOOD NEUROLOGICAL BOWEL NUTRITION STATUS  Other (Comment)(dementia, delirium, hallucinations worsened with UTI)   Continent, Incontinent Diet  AMBULATORY STATUS COMMUNICATION OF NEEDS Skin   Extensive Assist Verbally Other (Comment)(fissure at mid sacrum; moisture associated break down to groin)                       Personal Care Assistance Level of Assistance  Bathing, Dressing, Feeding Bathing Assistance: Maximum assistance Feeding assistance: Limited assistance Dressing Assistance: Maximum assistance     Functional Limitations Info  Sight, Hearing, Speech Sight Info: Adequate Hearing Info: Impaired Speech Info: Adequate    SPECIAL CARE FACTORS FREQUENCY  PT (By licensed PT), OT (By licensed OT)     PT Frequency: PT at SNF to eval and treat a min 5x week OT Frequency: OT at SNF to eval and treat a min 5x week            Contractures Contractures Info: Not present    Additional Factors Info  Code Status, Allergies Code Status Info: DNR Allergies Info: dust mite, mold, latex, tape, mentagrophyte           Current Medications (11/20/2019):  This is the current hospital active medication list Current Facility-Administered Medications  Medication Dose Route Frequency Provider Last Rate Last Admin  . acetaminophen (TYLENOL) tablet 650 mg  650 mg Oral Q6H PRN Mujtaba, Mohammadtokir, MD       Or  . acetaminophen (TYLENOL) suppository 650 mg  650 mg Rectal Q6H PRN Mujtaba, Mohammadtokir, MD      . aspirin EC tablet 325 mg  325 mg Oral Daily Mujtaba, Mohammadtokir, MD   325 mg at 11/20/19 1116  . cefTRIAXone (ROCEPHIN) 1 g in sodium chloride 0.9 % 100 mL IVPB  1 g Intravenous Q24H Mujtaba, Mohammadtokir, MD      . heparin injection 5,000 Units  5,000 Units Subcutaneous Q8H Mujtaba, Mohammadtokir, MD  5,000 Units at 11/20/19 1359  . hydrALAZINE (APRESOLINE) tablet 10 mg  10 mg Oral q12n4p Mujtaba, Mohammadtokir, MD   10 mg at 11/20/19 1359  . isosorbide mononitrate (IMDUR) 24 hr tablet 15 mg  15 mg Oral Daily Mujtaba, Mohammadtokir, MD   15 mg at 11/20/19 1116  . melatonin tablet 5 mg  5 mg Oral QHS [place] Donnamarie Poag, DO      . metoprolol tartrate (LOPRESSOR) tablet 25 mg  25 mg Oral BID Mujtaba, Mohammadtokir, MD   25 mg at 11/20/19 1116  . polyethylene glycol  (MIRALAX / GLYCOLAX) packet 17 g  17 g Oral Daily PRN Mujtaba, Mohammadtokir, MD      . QUEtiapine (SEROQUEL) tablet 12.5 mg  12.5 mg Oral QHS [place] Eric J, DO      . spironolactone (ALDACTONE) tablet 25 mg  25 mg Oral Q M,W,F Mujtaba, Mohammadtokir, MD   25 mg at 11/20/19 1116  . torsemide (DEMADEX) tablet 20 mg  20 mg Oral Q M,W,F Mujtaba, Mohammadtokir, MD   20 mg at 11/20/19 1116     Discharge Medications: Please see discharge summary for a list of discharge medications.  Relevant Imaging Results:  Relevant Lab Results:   Additional Information SSN [id number]; hospice care to be revoked for rehab then long term care  Bartholomew Crews, RN

## 2019-11-20 NOTE — Progress Notes (Signed)
PROGRESS NOTE    Lori [name]  KTG:[id number] DOB: [birth year] DOA: 11/19/2019 PCP: Leanna Battles, MD    Brief Narrative:  Lori [name] is a 86 year old Caucasian female with past medical history notable for macular degeneration, hard of hearing, essential hypertension, paroxysmal atrial fibrillation, chronic diastolic congestive heart failure, moderate MR, chronic lower extremity edema secondary to CHF/lymphedema/venous insufficiency, history of frequent falls who presents via EMS from home with episodes of delusion and paranoia.  Patient's H POA Lori [name] called EMS reporting patient had been violent, confused and delusional.  Apparently patient has been calling 911 thinking the house was on fire and accusing family of trying to hurt her over the past 2 weeks.  Lori [name] reports episodes have been worsening over the last 2 weeks and she was prescribed Haldol recently through hospice/her PCP and apparently not working for these episodes.  Per Lori [name], Foley catheter was placed 2 weeks ago for incontinence/skin breakdown.  Patient was hospitalized 09/18/2019 through 09/25/2019 for cellulitis.  Patient at time refused SNF and was therefore discharged home with home health.  Lori [name] provides care during the day and a private aide of a care for the patient at night.  Patient has home visits at least 3-4 times a week from hospice.  Patient currently ambulates with assistance of a walker.  In the ED, BP 124/64, HR 94, SPO2 92% on room air, RR 24.  WBC count 8.7, hemoglobin 10.5, platelets 331, sodium 138, potassium 4.5, BUN 26, creatinine 1.6, albumin 2.8.  LFTs within normal limits.  Urinalysis with large leukoesterase, negative nitrite, greater than 50 WBCs.  Urine culture: Pending.  Patient was started on ceftriaxone for UTI as leading cause of her acute delusional behavior.  TRH was consulted for admission.   Assessment & Plan:   Active Problems:   Atrial fibrillation (HCC)   Essential  hypertension   Moderate mitral regurgitation   Chronic diastolic CHF (congestive heart failure) (HCC)   Acute lower UTI   Acute metabolic encephalopathy   Encephalopathy acute   Acute metabolic encephalopathy Delirium Etiology likely secondary from urinary tract infection superimposed on likely underlying mild dementia.  TSH 0.996, within normal limits. --Continue treatment as depicted below. --Low-dose Seroquel 12.5 mg nightly for delusions/hallucinations  Urinary tract infection Patient presenting with delusions, worsening over the past few weeks following Foley catheter insertion 2 weeks ago.  Urinalysis with large leukoesterase, negative nitrite and greater than 50 WBCs. --Urine culture: Pending --Continue ceftriaxone 1 g IV every 24 hours --Supportive care  History of urinary retention Had Foley catheter placed 2 weeks ago.  Now developed UTI as above.  Foley disc continued on admission. --Continue to monitor urinary output, bladder scan as needed --Strict I's and O's  Paroxysmal atrial fibrillation Patient previously on metoprolol and epigastrium which has been discontinued by home hospice.  EKG on admission notable for A. fib with RVR with rate 118. --Restarted metoprolol 25 mg p.o. twice daily --Aspirin 325 mg p.o. daily --Continue to monitor on telemetry  Chronic diastolic congestive heart failure, compensated Moderate mitral regurgitation Essential hypertension Chest x-ray with stable bibasilar opacities, small bilateral pleural effusions, atelectasis and probable chronic ILD.  BNP elevated 654.  Oxygenating well on room air. --Torsemide 20 mg daily on Monday/Wednesday/Friday --Spironolactone 25 mg p.o. daily Monday/Wednesday/Friday --Metoprolol tartrate 25 mg p.o. twice daily --Started hydralazine 10 mg p.o. twice daily and Imdur 15 mg p.o. daily --Monitor blood pressure closely  Hypoalbuminemia Albumin 2.8, nutrition consulted for recommendations in regards to  supplementation  Weakness/debility: Patient with multiple falls at home, likely compounded by her history of hard of hearing and macular degeneration and legal blindness.  Also likely underlying dementia.  Unfortunately, patient's caregiver, granddaughter has to leave for 2 months for work purposes and no other family members able to care for patient during the day at home.  Patient currently not safe for discharge at this time given her advanced age and likely underlying dementia not able to care for herself. --PT/OT consultation: Pending   DVT prophylaxis: Heparin Code Status: DNR Family Communication: Updated patient's healthcare power of attorney, granddaughter Lori [name] via telephone this morning  Disposition Plan:  Status is: Observation  The patient remains OBS appropriate and will d/c before 2 midnights.  Dispo: The patient is from: Home              Anticipated d/c is to: SNF              Anticipated d/c date is: 2 days              Patient currently is not medically stable to d/c.   Consultants:   Psychiatry  Procedures:   None  Antimicrobials:   Ceftriaxone   Subjective: Patient seen and examined bedside, resting comfortably.  In good spirits this morning.  Just completed breakfast.  No other complaints or concerns at this time.  Denies headache, no chest pain, no shortness of breath, no abdominal pain.  No acute events overnight per nursing staff.  Objective: Vitals:   11/19/19 2107 11/20/19 0116 11/20/19 0446 11/20/19 0836  BP: 132/65 124/71 131/78 (!) 95/49  Pulse: 87 73 82 85  Resp: 16 15 16 18   Temp: 98.2 F (36.8 C) 98.4 F (36.9 C) 98.7 F (37.1 C) (!) 97.4 F (36.3 C)  TempSrc:    Oral  SpO2: 94% 94% 93% 91%    Intake/Output Summary (Last 24 hours) at 11/20/2019 1049 Last data filed at 11/20/2019 0900 Gross per 24 hour  Intake 580 ml  Output 200 ml  Net 380 ml   There were no vitals filed for this visit.  Examination:  General exam:  Appears calm and comfortable  Respiratory system: Clear to auscultation. Respiratory effort normal. Cardiovascular system: S1 & S2 heard, RRR. No JVD, murmurs, rubs, gallops or clicks. No pedal edema. Gastrointestinal system: Abdomen is nondistended, soft and nontender. No organomegaly or masses felt. Normal bowel sounds heard. Central nervous system: Alert and oriented. No focal neurological deficits. Extremities: Symmetric 5 x 5 power. Skin: No rashes, lesions or ulcers Psychiatry: Judgement and insight appear normal. Mood & affect appropriate.     Data Reviewed: I have personally reviewed following labs and imaging studies  CBC: Recent Labs  Lab 11/19/19 1016 11/20/19 0456  WBC 8.7 8.5  NEUTROABS 6.5  --   HGB 10.5* 10.9*  HCT 34.9* 36.8  MCV 66.5* 66.8*  PLT 331 295   Basic Metabolic Panel: Recent Labs  Lab 11/19/19 1016 11/20/19 0456  NA 138 140  K 4.5 4.6  CL 102 104  CO2 26 27  GLUCOSE 108* 90  BUN 26* 26*  CREATININE 1.68* 1.77*  CALCIUM 8.9 9.0  MG  --  1.8   GFR: CrCl cannot be calculated (Unknown ideal weight.). Liver Function Tests: Recent Labs  Lab 11/19/19 1016  AST 17  ALT 11  ALKPHOS 73  BILITOT 0.7  PROT 6.5  ALBUMIN 2.8*   No results for input(s): LIPASE, AMYLASE in the last 168 hours. No results  for input(s): AMMONIA in the last 168 hours. Coagulation Profile: No results for input(s): INR, PROTIME in the last 168 hours. Cardiac Enzymes: No results for input(s): CKTOTAL, CKMB, CKMBINDEX, TROPONINI in the last 168 hours. BNP (last 3 results) No results for input(s): PROBNP in the last 8760 hours. HbA1C: No results for input(s): HGBA1C in the last 72 hours. CBG: No results for input(s): GLUCAP in the last 168 hours. Lipid Profile: No results for input(s): CHOL, HDL, LDLCALC, TRIG, CHOLHDL, LDLDIRECT in the last 72 hours. Thyroid Function Tests: Recent Labs    11/20/19 0456  TSH 0.996   Anemia Panel: No results for input(s):  VITAMINB12, FOLATE, FERRITIN, TIBC, IRON, RETICCTPCT in the last 72 hours. Sepsis Labs: No results for input(s): PROCALCITON, LATICACIDVEN in the last 168 hours.  Recent Results (from the past 240 hour(s))  Urine culture     Status: Abnormal (Preliminary result)   Collection Time: 11/19/19 10:47 AM   Specimen: Urine, Random  Result Value Ref Range Status   Specimen Description URINE, RANDOM  Final   Special Requests NONE  Final   Culture (A)  Final    >=100,000 COLONIES/mL SERRATIA MARCESCENS SUSCEPTIBILITIES TO FOLLOW Performed at [facility], [address], [place], [place] [address]    Report Status PENDING  Incomplete         Radiology Studies: DG Chest 2 View  Result Date: 11/19/2019 CLINICAL DATA:  Transient delirium, agitation. Rule out infection. EXAM: CHEST - 2 VIEW COMPARISON:  Chest x-rays dated 09/20/2019 and 02/25/2018. FINDINGS: Stable hazy opacity at the RIGHT lung base. Stable denser opacity at the LEFT lung base. Coarse interstitial markings are again seen throughout both lungs suggesting chronic interstitial lung disease. Stable mild cardiomegaly. No acute appearing osseous abnormality. IMPRESSION: 1. No significant change compared to most recent chest x-ray of 09/20/2019. Stable bibasilar opacities, at least some component due to small bilateral pleural effusions. Superimposed atelectasis is likely. 2. Stable mild cardiomegaly. 3. Probable chronic interstitial lung disease. Electronically Signed   By: Franki Cabot M.D.   On: 11/19/2019 10:48        Scheduled Meds: . aspirin EC  325 mg Oral Daily  . heparin  5,000 Units Subcutaneous Q8H  . hydrALAZINE  10 mg Oral q12n4p  . isosorbide mononitrate  15 mg Oral Daily  . metoprolol tartrate  25 mg Oral BID  . spironolactone  25 mg Oral Q M,W,F  . torsemide  20 mg Oral Q M,W,F   Continuous Infusions: . cefTRIAXone (ROCEPHIN)  IV       LOS: 0 days    Time spent: 38 minutes spent on chart review,  discussion with nursing staff, consultants, updating family and interview/physical exam; more than 50% of that time was spent in counseling and/or coordination of care.    Phenix Grein J [place] DO [facility] Available via Epic secure chat 7am-7pm After these hours, please refer to coverage provider listed on [email or web address] 11/20/2019, 10:49 AM

## 2019-11-20 NOTE — Progress Notes (Signed)
Bladder scan showed approximately 125 cc urine in bladder.  Will continue to monitor patient.  Earleen Reaper RN

## 2019-11-20 NOTE — TOC Initial Note (Signed)
Transition of Care [facility]) - Initial/Assessment Note    Patient Details  Name: Lori [name] MRN: [id number] Date of Birth: 1918-12-25  Transition of Care [facility]) CM/SW Contact:    Bartholomew Crews, RN Phone Number: [phone] 11/20/2019, 3:27 PM  Clinical Narrative:                  Spoke with patient's son, Legrand Como, and daughter in law, [name], on the phone. They expressed concerns about patient living alone. PTA patient living in subsidized housing, received meals on wheels, hospice care through [facility], and private pay caregivers. [name] and Legrand Como are asking about long term placement.   Spoke with POA, Danielle Rankin, on the phone who also expressed concerns about patient living alone stating patient "not able to live alone." [name] stated that patient had become increasingly confused and combative when 911 was called. [name] stated that she had asked for hospice to come evaluate her, but it got to the point where she had to call 911. [name] states that her job requires a lot of traveling, and patient cannot continue to live alone. [name] is agreeable to faxing out patient information to area SNFs.   Spoke with Bevely Palmer at [facility] who verified that patient is active for hospice care. Patient will need to revoke her hospice care if she chooses to be skilled at a facility. Also spoke with [name] who is a Education officer, museum, at [employer] who confirmed that patient would need to revoke hospice status in order to be skilled.  Spoke with patient at the bedside. She stated that she had spoken with Charolett Bumpers, and [name] who will be here tomorrow to see patient. Patient's preference is to return home. She stated that the previous facility was "dirty" and they "gave her clothes away to [place]." Patient's preference for facilities would be [facility] stating that her brother had been there for a few months.  TOC team following for transition needs.   Expected Discharge Plan: Skilled  Nursing Facility Barriers to Discharge: Continued Medical Work up, SNF Pending bed offer   Patient Goals and CMS Choice   CMS Medicare.gov Compare Post Acute Care list provided to:: Patient Choice offered to / list presented to : Patient, [facility] POA / Guardian  Expected Discharge Plan and Services Expected Discharge Plan: [place] In-house Referral: Clinical Social Work Discharge Planning Services: CM Consult Post Acute Care Choice: [place] arrangements for the past 2 months: Apartment                 DME Arranged: N/A DME Agency: NA       HH Arranged: NA [place] Agency: NA        Prior Living Arrangements/Services Living arrangements for the past 2 months: Apartment Lives with:: Self Patient language and need for interpreter reviewed:: Yes        Need for Family Participation in Patient Care: Yes (Comment) Care giver support system in place?: No (comment)   Criminal Activity/Legal Involvement Pertinent to Current Situation/Hospitalization: No - Comment as needed  Activities of Daily Living   ADL Screening (condition at time of admission) Patient's cognitive ability adequate to safely complete daily activities?: Yes Is the patient deaf or have difficulty hearing?: No Does the patient have difficulty seeing, even when wearing glasses/contacts?: No Does the patient have difficulty concentrating, remembering, or making decisions?: No Patient able to express need for assistance with ADLs?: Yes Does the patient have difficulty dressing or bathing?: No Independently performs  ADLs?: Yes (appropriate for developmental age) Does the patient have difficulty walking or climbing stairs?: (Unknown)  Permission Sought/Granted Permission sought to share information with : Family Supports    Share Information with NAME: Danielle Rankin     Permission granted to share info w Relationship: HCPOA     Emotional Assessment Appearance:: Appears stated  age Attitude/Demeanor/Rapport: Engaged Affect (typically observed): Accepting Orientation: : Oriented to Self, Oriented to  Time, Oriented to Place Alcohol / Substance Use: Not Applicable Psych Involvement: Yes (comment)  Admission diagnosis:  Encephalopathy acute [O13.08] Acute metabolic encephalopathy [M57.84] Patient Active Problem List   Diagnosis Date Noted  . Acute lower UTI 11/19/2019  . Acute metabolic encephalopathy 69/62/9528  . Encephalopathy acute 11/19/2019  . Cellulitis 02/26/2018  . Chronic diastolic CHF (congestive heart failure) ([place]) 02/26/2018  . Chronic venous insufficiency 08/17/2017  . CKD (chronic kidney disease), stage IV ([place]) 08/17/2017  . Moderate mitral regurgitation 11/04/2016  . Elevated brain natriuretic peptide (BNP) level 11/04/2016  . Pulmonary HTN ([place]) 11/04/2016  . Hyponatremia 07/13/2012    Class: Acute  . Essential hypertension 03/31/2012  . Atrial fibrillation ([place]) 07/29/2011  . Peripheral edema 07/29/2011  . Cough 06/26/2010  . ANEMIA, IRON DEFICIENCY 06/10/2010  . ANEMIA-UNSPECIFIED 06/10/2010  . Asthma 06/10/2010  . PERSONAL HX COLONIC POLYPS 06/10/2010   PCP:  Leanna Battles, MD Pharmacy:   [id number] [address], [place] [address] Phone: [phone] Fax: [phone]  [facility] [place], [place] [address] Phone: [phone] Fax: [phone]  [facility] [place], [place] [address] [place] [address] Phone: [phone] Fax: [phone]     Social Determinants of Health (SDOH) Interventions    Readmission Risk Interventions No flowsheet data found.

## 2019-11-21 DIAGNOSIS — N184 Chronic kidney disease, stage 4 (severe): Secondary | ICD-10-CM

## 2019-11-21 LAB — CBC
HCT: 32.2 % — ABNORMAL LOW (ref 36.0–46.0)
Hemoglobin: 9.6 g/dL — ABNORMAL LOW (ref 12.0–15.0)
MCH: 20 pg — ABNORMAL LOW (ref 26.0–34.0)
MCHC: 29.8 g/dL — ABNORMAL LOW (ref 30.0–36.0)
MCV: 66.9 fL — ABNORMAL LOW (ref 80.0–100.0)
Platelets: 271 10*3/uL (ref 150–400)
RBC: 4.81 MIL/uL (ref 3.87–5.11)
RDW: 16 % — ABNORMAL HIGH (ref 11.5–15.5)
WBC: 6.6 10*3/uL (ref 4.0–10.5)
nRBC: 0 % (ref 0.0–0.2)

## 2019-11-21 LAB — BASIC METABOLIC PANEL
Anion gap: 11 (ref 5–15)
BUN: 33 mg/dL — ABNORMAL HIGH (ref 8–23)
CO2: 26 mmol/L (ref 22–32)
Calcium: 8.6 mg/dL — ABNORMAL LOW (ref 8.9–10.3)
Chloride: 102 mmol/L (ref 98–111)
Creatinine, Ser: 2.12 mg/dL — ABNORMAL HIGH (ref 0.44–1.00)
GFR calc Af Amer: 22 mL/min — ABNORMAL LOW (ref >60)
GFR calc non Af Amer: 19 mL/min — ABNORMAL LOW (ref >60)
Glucose, Bld: 88 mg/dL (ref 70–99)
Potassium: 4.5 mmol/L (ref 3.5–5.1)
Sodium: 139 mmol/L (ref 135–145)

## 2019-11-21 LAB — URINE CULTURE: Culture: >=100000 — AB

## 2019-11-21 LAB — MAGNESIUM: Magnesium: 1.8 mg/dL (ref 1.7–2.4)

## 2019-11-21 MED ORDER — SODIUM CHLORIDE 0.9 % IV BOLUS
250.0000 mL | Freq: Once | INTRAVENOUS | Status: AC
  Administered 2019-11-21: 250 mL via INTRAVENOUS

## 2019-11-21 NOTE — Progress Notes (Signed)
PROGRESS NOTE    Lori [name]  NFA:[id number] DOB: 1918-11-29 DOA: 11/19/2019 PCP: Leanna Battles, MD    Brief Narrative:  Lori [name] is a 86 year old Caucasian female with past medical history notable for macular degeneration, hard of hearing, essential hypertension, paroxysmal atrial fibrillation, chronic diastolic congestive heart failure, moderate MR, chronic lower extremity edema secondary to CHF/lymphedema/venous insufficiency, history of frequent falls who presents via EMS from home with episodes of delusion and paranoia.  Patient's HCPOA Lori [name] called EMS reporting patient had been violent, confused and delusional.  Apparently patient has been calling 911 thinking the house was on fire and accusing family of trying to hurt her over the past 2 weeks.  Lori [name] reports episodes have been worsening over the last 2 weeks and she was prescribed Haldol recently through hospice/her PCP and apparently not working for these episodes.  Per Lori [name], Foley catheter was placed 2 weeks ago for incontinence/skin breakdown.  Patient was hospitalized 09/18/2019 through 09/25/2019 for cellulitis.  Patient at time refused SNF and was therefore discharged home with home health.  Lori [name] provides care during the day and a private aide of a care for the patient at night.  Patient has home visits at least 3-4 times a week from hospice.  Patient currently ambulates with assistance of a walker.  In the ED, BP 124/64, HR 94, SPO2 92% on room air, RR 24.  WBC count 8.7, hemoglobin 10.5, platelets 331, sodium 138, potassium 4.5, BUN 26, creatinine 1.6, albumin 2.8.  LFTs within normal limits.  Urinalysis with large leukoesterase, negative nitrite, greater than 50 WBCs.  Urine culture: Pending.  Patient was started on ceftriaxone for UTI as leading cause of her acute delusional behavior.  TRH was consulted for admission.   Assessment & Plan:   Active Problems:   Atrial fibrillation (HCC)   Essential  hypertension   Moderate mitral regurgitation   Chronic diastolic CHF (congestive heart failure) (HCC)   Acute lower UTI   Acute metabolic encephalopathy   Encephalopathy acute   Acute metabolic encephalopathy Delirium Etiology likely secondary from urinary tract infection superimposed on likely underlying mild dementia.  TSH 0.996, within normal limits. --Continue treatment as depicted below. --Started on low-dose Seroquel 12.5 mg nightly for delusions/hallucinations  Serratia marcescens urinary tract infection Patient presenting with delusions, worsening over the past few weeks following Foley catheter insertion 2 weeks ago.  Urinalysis with large leukoesterase, negative nitrite and greater than 50 WBCs. --Urine culture: With Serratia marcescens pending susceptibilities --Continue ceftriaxone 1 g IV every 24 hours --Supportive care  CKD stage IV Baseline creatinine 1.90-2.0 with a GFR of 22-3 23 over the past year.  Creatinine mission, now rising to 2.12.  On furosemide 40mg  PO daily and spironolactone on a Monday/Wednesday/Friday schedule at home. --Will hold home diuretics for now --NS 500 mL bolus today --Avoid nephrotoxins, renally dose all medications --Repeat BMP in a.m.  History of urinary retention Had Foley catheter placed 2 weeks ago.  Now developed UTI as above.  Foley catheter discontinued on admission. --Continue to monitor urinary output, bladder scan as needed --Strict I's and O's  Paroxysmal atrial fibrillation Patient previously on metoprolol and epigastrium which has been discontinued by home hospice.  EKG on admission notable for A. fib with RVR with rate 118. --Restarted metoprolol 25 mg p.o. twice daily --Aspirin 325 mg p.o. daily --Continue to monitor on telemetry  Chronic diastolic congestive heart failure, compensated Moderate mitral regurgitation Essential hypertension Chest x-ray with stable bibasilar opacities, small bilateral  pleural effusions,  atelectasis and probable chronic ILD.  BNP elevated 654.  Oxygenating well on room air. --Discontinue home furosemide and spironolactone for now given rising creatinine --Metoprolol tartrate 25 mg p.o. twice daily --Started hydralazine 10 mg p.o. twice daily and Imdur 15 mg p.o. daily --Monitor blood pressure closely  Hypoalbuminemia Albumin 2.8, nutrition consulted for recommendations in regards to supplementation  Weakness/debility: Patient with multiple falls at home, likely compounded by her history of hard of hearing and macular degeneration and legal blindness.  Also likely underlying dementia.  Unfortunately, patient's caregiver, granddaughter has to leave for 2 months for work purposes and no other family members able to care for patient during the day at home.  Patient currently not safe for discharge at this time given her advanced age and likely underlying dementia not able to care for herself. --PT/OT following --Pending SNF placement.   DVT prophylaxis: Heparin Code Status: DNR Family Communication: Updated patient's healthcare power of attorney, granddaughter Lori [name] via telephone yesterday who is in agreement with placement  Disposition Plan:  Status is: Inpatient  The patient will require care spanning > 2 midnights and should be moved to inpatient because: Altered mental status, Ongoing diagnostic testing needed not appropriate for outpatient work up, Unsafe d/c plan and IV treatments appropriate due to intensity of illness or inability to take PO  Dispo: The patient is from: Home              Anticipated d/c is to: SNF              Anticipated d/c date is: 2 days              Patient currently is not medically stable to d/c.   Consultants:   Psychiatry  Procedures:   None  Antimicrobials:   Ceftriaxone   Subjective: Patient seen and examined bedside, resting comfortably.  States slept very well this morning.  Awaiting breakfast this morning. No other  complaints or concerns at this time.  Denies headache, no chest pain, no shortness of breath, no abdominal pain.  No acute events overnight per nursing staff.  Objective: Vitals:   11/20/19 1637 11/20/19 2109 11/21/19 0443 11/21/19 0936  BP: (!) 91/57 119/82 123/61 (!) 90/43  Pulse: 66 85 91 63  Resp: 18 18 16 18   Temp: 98.4 F (36.9 C) 98.1 F (36.7 C) 97.8 F (36.6 C) (!) 97.5 F (36.4 C)  TempSrc: Oral Oral Oral Oral  SpO2: 95% 96% 94% 93%  Weight:  81.6 kg      Intake/Output Summary (Last 24 hours) at 11/21/2019 0955 Last data filed at 11/21/2019 0545 Gross per 24 hour  Intake 700 ml  Output 1000 ml  Net -300 ml   Filed Weights   11/20/19 2109  Weight: 81.6 kg    Examination:  General exam: Appears calm and comfortable  Respiratory system: Clear to auscultation. Respiratory effort normal. Cardiovascular system: S1 & S2 heard, RRR. No JVD, murmurs, rubs, gallops or clicks. No pedal edema. Gastrointestinal system: Abdomen is nondistended, soft and nontender. No organomegaly or masses felt. Normal bowel sounds heard. Central nervous system: Alert and oriented. No focal neurological deficits. Extremities: Symmetric 5 x 5 power. Skin: No rashes, lesions or ulcers Psychiatry: Judgement and insight appear normal. Mood & affect appropriate.     Data Reviewed: I have personally reviewed following labs and imaging studies  CBC: Recent Labs  Lab 11/19/19 1016 11/20/19 0456 11/21/19 0440  WBC 8.7 8.5 6.6  NEUTROABS  6.5  --   --   HGB 10.5* 10.9* 9.6*  HCT 34.9* 36.8 32.2*  MCV 66.5* 66.8* 66.9*  PLT 331 337 758   Basic Metabolic Panel: Recent Labs  Lab 11/19/19 1016 11/20/19 0456 11/21/19 0440  NA 138 140 139  K 4.5 4.6 4.5  CL 102 104 102  CO2 26 27 26   GLUCOSE 108* 90 88  BUN 26* 26* 33*  CREATININE 1.68* 1.77* 2.12*  CALCIUM 8.9 9.0 8.6*  MG  --  1.8 1.8   GFR: Estimated Creatinine Clearance: 15.3 mL/min (A) (by C-G formula based on SCr of 2.12  mg/dL (H)). Liver Function Tests: Recent Labs  Lab 11/19/19 1016  AST 17  ALT 11  ALKPHOS 73  BILITOT 0.7  PROT 6.5  ALBUMIN 2.8*   No results for input(s): LIPASE, AMYLASE in the last 168 hours. No results for input(s): AMMONIA in the last 168 hours. Coagulation Profile: No results for input(s): INR, PROTIME in the last 168 hours. Cardiac Enzymes: No results for input(s): CKTOTAL, CKMB, CKMBINDEX, TROPONINI in the last 168 hours. BNP (last 3 results) No results for input(s): PROBNP in the last 8760 hours. HbA1C: No results for input(s): HGBA1C in the last 72 hours. CBG: No results for input(s): GLUCAP in the last 168 hours. Lipid Profile: No results for input(s): CHOL, HDL, LDLCALC, TRIG, CHOLHDL, LDLDIRECT in the last 72 hours. Thyroid Function Tests: Recent Labs    11/20/19 0456  TSH 0.996   Anemia Panel: No results for input(s): VITAMINB12, FOLATE, FERRITIN, TIBC, IRON, RETICCTPCT in the last 72 hours. Sepsis Labs: No results for input(s): PROCALCITON, LATICACIDVEN in the last 168 hours.  Recent Results (from the past 240 hour(s))  Urine culture     Status: Abnormal   Collection Time: 11/19/19 10:47 AM   Specimen: Urine, Random  Result Value Ref Range Status   Specimen Description URINE, RANDOM  Final   Special Requests   Final    NONE Performed at [facility], [address] [place], [place] [address]    Culture >=100,000 COLONIES/mL SERRATIA MARCESCENS (A)  Final   Report Status 11/21/2019 FINAL  Final   Organism ID, Bacteria SERRATIA MARCESCENS (A)  Final      Susceptibility   Serratia marcescens - MIC*    CEFAZOLIN >=64 RESISTANT Resistant     CEFTRIAXONE <=1 SENSITIVE Sensitive     CIPROFLOXACIN <=0.25 SENSITIVE Sensitive     GENTAMICIN <=1 SENSITIVE Sensitive     NITROFURANTOIN 128 RESISTANT Resistant     TRIMETH/SULFA <=20 SENSITIVE Sensitive     * >=100,000 COLONIES/mL SERRATIA MARCESCENS         Radiology Studies: DG Chest 2  View  Result Date: 11/19/2019 CLINICAL DATA:  Transient delirium, agitation. Rule out infection. EXAM: CHEST - 2 VIEW COMPARISON:  Chest x-rays dated 09/20/2019 and 02/25/2018. FINDINGS: Stable hazy opacity at the RIGHT lung base. Stable denser opacity at the LEFT lung base. Coarse interstitial markings are again seen throughout both lungs suggesting chronic interstitial lung disease. Stable mild cardiomegaly. No acute appearing osseous abnormality. IMPRESSION: 1. No significant change compared to most recent chest x-ray of 09/20/2019. Stable bibasilar opacities, at least some component due to small bilateral pleural effusions. Superimposed atelectasis is likely. 2. Stable mild cardiomegaly. 3. Probable chronic interstitial lung disease. Electronically Signed   By: Franki Cabot M.D.   On: 11/19/2019 10:48        Scheduled Meds: . aspirin EC  325 mg Oral Daily  .  heparin  5,000 Units Subcutaneous Q8H  . hydrALAZINE  10 mg Oral q12n4p  . isosorbide mononitrate  15 mg Oral Daily  . melatonin  5 mg Oral QHS  . metoprolol tartrate  25 mg Oral BID  . QUEtiapine  12.5 mg Oral QHS  . spironolactone  25 mg Oral Q M,W,F  . torsemide  20 mg Oral Q M,W,F   Continuous Infusions: . cefTRIAXone (ROCEPHIN)  IV 1 g (11/20/19 1802)     LOS: 1 day    Time spent: 35 minutes spent on chart review, discussion with nursing staff, consultants, updating family and interview/physical exam; more than 50% of that time was spent in counseling and/or coordination of care.    Tylar Merendino J [place] DO [facility] Available via Epic secure chat 7am-7pm After these hours, please refer to coverage provider listed on [email or web address] 11/21/2019, 9:55 AM

## 2019-11-21 NOTE — Progress Notes (Signed)
[facility] Hospitalized patient visit.  Lori [name] is a current hospice patient with terminal diagnosis of Diastolic heart failure. Family activated EMS after patient became confused and combative. The family contacted hospice prior to contacting EMS but declined to wait for hospice to visit patient in the home. Patient was admitted to [facility] on 11/19/19 with a diagnosis of altered mental status and suspected UTI. Per Dr. Karie Georges with [facility] this is a related hospital admission.   Visited patient at bedside. No visitors present. Patient sleeping and in no apparent distress. She continues to eat about 50% of meals.   VS: 97.5, 90/43, 63, 18, 93% RA I/O: 940/1000  Abn Labs: BUN 33, Creatinine 2.12, Calcium 8.6, GFR 22, Hgb 9.6, HCT 32.2, MCV 66.9, MCH 20.0, MCHC 29.8, RDW 16.0   IV/PRN medications: Rocephin 1gm IVPB QD, no PRNs.   Problem List: #Delirium/acute metabolic encephalopathy -Serratia marcescens urinary tract infection #CKD stage IV #Chronic DHF/RV failure/moderate MR,  #Hypoalbuminemia 2.8 #Weakness/debility: multiple falls at home.   Discharge Planning: Currently looking at LTC placement. Will likely revoke hospice benefit while receiving rehab and have Palliative follow until rehab is complete then resume hospice in LTC.   Family contact: [name] - caregiver/friend communicated with. Advised going forward to follow up with granddaughter [name] instead. Spoke with [name] by phone and updated her as well.   IDG: updated.  Goals of care: DNR.   Should patient need ambulance transport at discharge- please use [facility] as they contract this service for our active hospice patients.   Please call with any hospice related questions.  Farrel Gordon, RN, [facility] (Liaison [place] [facility] [place]) [phone]

## 2019-11-21 NOTE — TOC Progression Note (Signed)
Transition of Care [facility]) - Progression Note    Patient Details  Name: Lori [name] MRN: [id number] Date of Birth: September 07, 1918  Transition of [facility]) CM/SW Contact  Bartholomew Crews, RN Phone Number: [phone] 11/21/2019, 11:41 AM  Clinical Narrative:     Spoke with patient's granddaughter, [name], and patient at bedside. Discussed process of SNF placement and LTC needs. If decision for SNF is made, hospice will need to be revoked. [name], SW at [facility], is following for patient disposition. Bed offers provided to patient and [name] who shared offers with her mom, [name], and [place], [name] in order to assist with researching facilities. TOC team following for transition needs.   Expected Discharge Plan: [place] Barriers to Discharge: Continued Medical Work up, SNF Pending bed offer  Expected Discharge Plan and Services Expected Discharge Plan: [place] In-house Referral: Clinical Social Work Discharge Planning Services: CM Consult Post Acute Care Choice: [place] Living arrangements for the past 2 months: Apartment                 DME Arranged: N/A DME Agency: NA       HH Arranged: NA HH Agency: NA         Social Determinants of Health (SDOH) Interventions    Readmission Risk Interventions No flowsheet data found.

## 2019-11-22 DIAGNOSIS — R4182 Altered mental status, unspecified: Secondary | ICD-10-CM

## 2019-11-22 LAB — BASIC METABOLIC PANEL
Anion gap: 15 (ref 5–15)
BUN: 41 mg/dL — ABNORMAL HIGH (ref 8–23)
CO2: 23 mmol/L (ref 22–32)
Calcium: 8.5 mg/dL — ABNORMAL LOW (ref 8.9–10.3)
Chloride: 100 mmol/L (ref 98–111)
Creatinine, Ser: 2.08 mg/dL — ABNORMAL HIGH (ref 0.44–1.00)
GFR calc Af Amer: 22 mL/min — ABNORMAL LOW (ref >60)
GFR calc non Af Amer: 19 mL/min — ABNORMAL LOW (ref >60)
Glucose, Bld: 91 mg/dL (ref 70–99)
Potassium: 4.8 mmol/L (ref 3.5–5.1)
Sodium: 138 mmol/L (ref 135–145)

## 2019-11-22 LAB — CBC
HCT: 32 % — ABNORMAL LOW (ref 36.0–46.0)
Hemoglobin: 9.6 g/dL — ABNORMAL LOW (ref 12.0–15.0)
MCH: 20 pg — ABNORMAL LOW (ref 26.0–34.0)
MCHC: 30 g/dL (ref 30.0–36.0)
MCV: 66.8 fL — ABNORMAL LOW (ref 80.0–100.0)
Platelets: 273 10*3/uL (ref 150–400)
RBC: 4.79 MIL/uL (ref 3.87–5.11)
RDW: 16 % — ABNORMAL HIGH (ref 11.5–15.5)
WBC: 6.8 10*3/uL (ref 4.0–10.5)
nRBC: 0 % (ref 0.0–0.2)

## 2019-11-22 LAB — MAGNESIUM: Magnesium: 1.9 mg/dL (ref 1.7–2.4)

## 2019-11-22 NOTE — NC FL2 (Signed)
[place] LEVEL OF CARE SCREENING TOOL     IDENTIFICATION  Patient Name: Lori [name] Birthdate: Aug 23, 1918 Sex: female Admission Date (Current Location): 11/19/2019  [facility] and [place] Number:  Herbalist and Address:  [facility] [place], [place] [address] [place], [place] [address]      Provider Number: [id number]  Attending Physician Name and Address:  Nita Sells, MD  Relative Name and Phone Number:       Current Level of Care: Hospital Recommended Level of Care: [place] Prior Approval Number:    Date Approved/Denied: 11/11/16 PASRR Number: [id number]  Discharge Plan: SNF    Current Diagnoses: Patient Active Problem List   Diagnosis Date Noted  . Acute lower UTI 11/19/2019  . Acute metabolic encephalopathy 69/62/9528  . Encephalopathy acute 11/19/2019  . Cellulitis 02/26/2018  . Chronic diastolic CHF (congestive heart failure) ([place]) 02/26/2018  . Chronic venous insufficiency 08/17/2017  . CKD (chronic kidney disease), stage IV ([place]) 08/17/2017  . Moderate mitral regurgitation 11/04/2016  . Elevated brain natriuretic peptide (BNP) level 11/04/2016  . Pulmonary HTN ([place]) 11/04/2016  . Hyponatremia 07/13/2012  . Essential hypertension 03/31/2012  . Atrial fibrillation ([place]) 07/29/2011  . Peripheral edema 07/29/2011  . Cough 06/26/2010  . ANEMIA, IRON DEFICIENCY 06/10/2010  . ANEMIA-UNSPECIFIED 06/10/2010  . Asthma 06/10/2010  . PERSONAL HX COLONIC POLYPS 06/10/2010    Orientation RESPIRATION BLADDER Height & Weight     Self, Time, Place  Normal Incontinent Weight: 81.6 kg Height:  5\' 4"  (162.6 cm)  BEHAVIORAL SYMPTOMS/MOOD NEUROLOGICAL BOWEL NUTRITION STATUS      Continent, Incontinent Diet  AMBULATORY STATUS COMMUNICATION OF NEEDS Skin   Extensive Assist Verbally Other (Comment)(fissure at mid sacrum; moisture associated break down to groin)                       Personal  Care Assistance Level of Assistance  Bathing, Dressing, Feeding Bathing Assistance: Maximum assistance Feeding assistance: Limited assistance Dressing Assistance: Maximum assistance     Functional Limitations Info  Sight, Hearing, Speech Sight Info: Adequate Hearing Info: Impaired Speech Info: Adequate    SPECIAL CARE FACTORS FREQUENCY  PT (By licensed PT), OT (By licensed OT)     PT Frequency: PT at SNF to eval and treat a min 5x week OT Frequency: OT at SNF to eval and treat a min 5x week            Contractures Contractures Info: Not present    Additional Factors Info  Code Status, Allergies Code Status Info: DNR Allergies Info: dust mite, mold, latex, tape, mentagrophyte           Current Medications (11/22/2019):  This is the current hospital active medication list Current Facility-Administered Medications  Medication Dose Route Frequency Provider Last Rate Last Admin  . acetaminophen (TYLENOL) tablet 650 mg  650 mg Oral Q6H PRN Mujtaba, Mohammadtokir, MD       Or  . acetaminophen (TYLENOL) suppository 650 mg  650 mg Rectal Q6H PRN Mujtaba, Mohammadtokir, MD      . aspirin EC tablet 325 mg  325 mg Oral Daily Mujtaba, Mohammadtokir, MD   325 mg at 11/21/19 0848  . heparin injection 5,000 Units  5,000 Units Subcutaneous Q8H Mujtaba, Mohammadtokir, MD   5,000 Units at 11/21/19 2058  . hydrALAZINE (APRESOLINE) tablet 10 mg  10 mg Oral q12n4p Mujtaba, Mohammadtokir, MD   10 mg at 11/21/19 1200  .  isosorbide mononitrate (IMDUR) 24 hr tablet 15 mg  15 mg Oral Daily Mujtaba, Mohammadtokir, MD   15 mg at 11/21/19 0848  . melatonin tablet 5 mg  5 mg Oral QHS [place] Donnamarie Poag, DO   5 mg at 11/21/19 2057  . metoprolol tartrate (LOPRESSOR) tablet 25 mg  25 mg Oral BID Mujtaba, Mohammadtokir, MD   25 mg at 11/21/19 0848  . polyethylene glycol (MIRALAX / GLYCOLAX) packet 17 g  17 g Oral Daily PRN Mujtaba, Mohammadtokir, MD      . QUEtiapine (SEROQUEL) tablet 12.5 mg  12.5 mg Oral  QHS [place] Donnamarie Poag, DO   12.5 mg at 11/21/19 2057     Discharge Medications: Please see discharge summary for a list of discharge medications.  Relevant Imaging Results:  Relevant Lab Results:   Additional Information SSN [id number]; hospice care to be revoked for rehab then long term care  Bartholomew Crews, RN

## 2019-11-22 NOTE — Progress Notes (Signed)
PROGRESS NOTE    Lori [name]  GMW:[id number] DOB: 01-08-1919 DOA: 11/19/2019 PCP: Leanna Battles, MD    Brief Narrative:   86 year old dlightful fem  macular degeneration,  hard of hearing,  essential hypertension, paroxysmal atrial fibrillation,  chronic diastolic congestive heart failure,  moderate MR, chronic lower extremity edema secondary to CHF/lymphedema/venous insufficiency,  history of frequent falls who presents via EMS from home with episodes of delusion and paranoia.   was violent, confused and delusional.- calling 911 thinking the house was on fire and accusing family of trying to hurt her over the past 2 weeks.   Lori [name] reports episodes have been worsening over the last 2 weeks and she was prescribed Haldol recently through hospice/her PCP and apparently not working for these episodes.   Foley catheter was placed 2 weeks ago for incontinence/skin breakdown.   Patient was hospitalized 09/18/2019 through 09/25/2019 for cellulitis.  [name] at time refused SNF and was therefore discharged home with home health.   Lori [name] provides care during the day and a private aide of a care for the patient at night.  Patient has home visits at least 3-4 times a week from hospice.  Patient currently ambulates with assistance of a walker.  In the ED, BP 124/64, HR 94, SPO2 92% on room air, RR 24.  WBC count 8.7, hemoglobin 10.5, platelets 331, sodium 138, potassium 4.5, BUN 26, creatinine 1.6, albumin 2.8.  LFTs within normal limits.  Urinalysis with large leukoesterase, negative nitrite, greater than 50 WBCs.  Urine culture: Pending.  Patient was started on ceftriaxone for UTI as leading cause of her acute delusional behavior.  TRH was consulted for admission.   Assessment & Plan:   Active Problems:   Atrial fibrillation (HCC)   Essential hypertension   Moderate mitral regurgitation   Chronic diastolic CHF (congestive heart failure) (HCC)   Acute lower UTI   Acute metabolic  encephalopathy   Encephalopathy acute   Acute metabolic encephalopathy Delirium--RESOLVED Patient's confusional state is much better and she is coherent knows time date and person --Continue treatment as depicted below. --Would continue for now low-dose Seroquel 12.5 mg nightly and can discontinue if resolves over the next several weeks  Serratia marcescens urinary tract infection Patient presenting with delusions, worsening over the past few weeks following Foley catheter insertion 2 weeks ago.  Urinalysis with large leukoesterase, negative nitrite and greater than 50 WBCs. --Urine culture: With Serratia marcescens which is pansensitive has received 4 days of IV ceftriaxone and this was promptly discontinued on 5/19  CKD stage IV Baseline creatinine 1.90-2.0 with a GFR of 22-3 23 over the past year.  Creatinine mission, now rising to 2.12.  On furosemide 40mg  PO daily and spironolactone on a Monday/Wednesday/Friday schedule at home. --All diuretics on hold --Creatinine down to 2.0 which is close to baseline  History of urinary retention Had Foley catheter placed 2 weeks ago.  Now developed UTI as above.  Foley catheter discontinued on admission. --Continue to monitor urinary output, bladder scan as needed --Strict I's and O's  Paroxysmal atrial fibrillation Patient previously on metoprolol awhich has been discontinued by home hospice.  EKG on admission notable for A. fib with RVR with rate 118. --Restarted metoprolol 25 mg p.o. twice daily --Aspirin 325 mg p.o. daily --Continue to monitor on telemetry  Chronic diastolic congestive heart failure, compensated Moderate mitral regurgitation Essential hypertension Chest x-ray with stable bibasilar opacities, small bilateral pleural effusions, atelectasis and probable chronic ILD.  BNP elevated 654.  Oxygenating well on room air. --Discontinue home furosemide and spironolactone for now given rising creatinine --Metoprolol tartrate 25 mg  p.o. twice daily --New this admission-hydralazine 10 mg p.o. twice daily and Imdur 15 mg p.o. daily   Hypoalbuminemia Albumin 2.8, nutrition consulted for recommendations in regards to supplementation  Weakness/debility: Patient with multiple falls at home, likely compounded by her history of hard of hearing and macular degeneration -  Unfortunately, patient's caregiver, granddaughter has to leave for 2 months for work purposes and no other family members able to care for patient during the day at home.  Patient currently not safe for discharge at this time given her advanced age and likely underlying dementia not able to care for herself. --PT/OT following --Pending SNF placement.   DVT prophylaxis: Heparin Code Status: DNR Family Communication: Updated patient's healthcare power of attorney, granddaughter Lori [name] 5/19  Disposition Plan:  Status is: Inpatient  The patient will require care spanning > 2 midnights and should be moved to inpatient because: Altered mental status, Ongoing diagnostic testing needed not appropriate for outpatient work up, Unsafe d/c plan and IV treatments appropriate due to intensity of illness or inability to take PO  Dispo: The patient is from: Home              Anticipated d/c is to: SNF              Anticipated d/c date is: 2 days              Patient currently is not medically stable to d/c.   Consultants:   Psychiatry  Procedures:   None  Antimicrobials:   Ceftriaxone   Subjective: Awake coherent much more oriented compared to prior notes seem to suggest no distress eating and drinking can tell me date time year Objective: Vitals:   11/21/19 2034 11/22/19 0502 11/22/19 0620 11/22/19 0935  BP: (!) 111/56 (!) 130/56  132/66  Pulse: 80 86  86  Resp: 16 16  18   Temp: 97.6 F (36.4 C) 98.5 F (36.9 C)  98 F (36.7 C)  TempSrc: Oral Oral  Oral  SpO2: 95% 93%  96%  Weight:      Height:   5\' 4"  (1.626 m)     Intake/Output Summary  (Last 24 hours) at 11/22/2019 1517 Last data filed at 11/22/2019 1300 Gross per 24 hour  Intake 1000 ml  Output 1245 ml  Net -245 ml   Filed Weights   11/20/19 2109  Weight: 81.6 kg    Examination:  Awake pleasant cooperative no distress EOMI NCAT no focal deficit no icterus no pallor CTA B no added sound no rales no rhonchi trachea midline S1-S2 no murmur rub or gallop atrial fibrillation on monitors Abdomen soft no rebound no guarding no hepatosplenomegaly no organomegaly Mild trace edema no lower extremity rash or skin lesion Back of her skin on her back shows seborrheic keratoses Euthymic pleasant    Data Reviewed: I have personally reviewed following labs and imaging studies  CBC: Recent Labs  Lab 11/19/19 1016 11/20/19 0456 11/21/19 0440 11/22/19 0451  WBC 8.7 8.5 6.6 6.8  NEUTROABS 6.5  --   --   --   HGB 10.5* 10.9* 9.6* 9.6*  HCT 34.9* 36.8 32.2* 32.0*  MCV 66.5* 66.8* 66.9* 66.8*  PLT 331 337 271 885   Basic Metabolic Panel: Recent Labs  Lab 11/19/19 1016 11/20/19 0456 11/21/19 0440 11/22/19 0451  NA 138 140 139 138  K 4.5 4.6 4.5 4.8  CL 102 104 102 100  CO2 26 27 26 23   GLUCOSE 108* 90 88 91  BUN 26* 26* 33* 41*  CREATININE 1.68* 1.77* 2.12* 2.08*  CALCIUM 8.9 9.0 8.6* 8.5*  MG  --  1.8 1.8 1.9   GFR: Estimated Creatinine Clearance: 15.6 mL/min (A) (by C-G formula based on SCr of 2.08 mg/dL (H)). Liver Function Tests: Recent Labs  Lab 11/19/19 1016  AST 17  ALT 11  ALKPHOS 73  BILITOT 0.7  PROT 6.5  ALBUMIN 2.8*   No results for input(s): LIPASE, AMYLASE in the last 168 hours. No results for input(s): AMMONIA in the last 168 hours. Coagulation Profile: No results for input(s): INR, PROTIME in the last 168 hours. Cardiac Enzymes: No results for input(s): CKTOTAL, CKMB, CKMBINDEX, TROPONINI in the last 168 hours. BNP (last 3 results) No results for input(s): PROBNP in the last 8760 hours. HbA1C: No results for input(s): HGBA1C in  the last 72 hours. CBG: No results for input(s): GLUCAP in the last 168 hours. Lipid Profile: No results for input(s): CHOL, HDL, LDLCALC, TRIG, CHOLHDL, LDLDIRECT in the last 72 hours. Thyroid Function Tests: Recent Labs    11/20/19 0456  TSH 0.996   Anemia Panel: No results for input(s): VITAMINB12, FOLATE, FERRITIN, TIBC, IRON, RETICCTPCT in the last 72 hours. Sepsis Labs: No results for input(s): PROCALCITON, LATICACIDVEN in the last 168 hours.  Recent Results (from the past 240 hour(s))  Urine culture     Status: Abnormal   Collection Time: 11/19/19 10:47 AM   Specimen: Urine, Random  Result Value Ref Range Status   Specimen Description URINE, RANDOM  Final   Special Requests   Final    NONE Performed at [facility], [address] [place], [place] [address]    Culture >=100,000 COLONIES/mL SERRATIA MARCESCENS (A)  Final   Report Status 11/21/2019 FINAL  Final   Organism ID, Bacteria SERRATIA MARCESCENS (A)  Final      Susceptibility   Serratia marcescens - MIC*    CEFAZOLIN >=64 RESISTANT Resistant     CEFTRIAXONE <=1 SENSITIVE Sensitive     CIPROFLOXACIN <=0.25 SENSITIVE Sensitive     GENTAMICIN <=1 SENSITIVE Sensitive     NITROFURANTOIN 128 RESISTANT Resistant     TRIMETH/SULFA <=20 SENSITIVE Sensitive     * >=100,000 COLONIES/mL SERRATIA MARCESCENS         Radiology Studies: No results found.      Scheduled Meds: . aspirin EC  325 mg Oral Daily  . heparin  5,000 Units Subcutaneous Q8H  . hydrALAZINE  10 mg Oral q12n4p  . isosorbide mononitrate  15 mg Oral Daily  . melatonin  5 mg Oral QHS  . metoprolol tartrate  25 mg Oral BID  . QUEtiapine  12.5 mg Oral QHS   Continuous Infusions:    LOS: 2 days    Time spent: 25 minutes spent on chart review, discussion with nursing staff, consultants, updating family and interview/physical exam; more than 50% of that time was spent in counseling and/or coordination of care.   Verneita Griffes,  MD [facility] 3:31 PM  Available via Epic secure chat 7am-7pm After these hours, please refer to coverage provider listed on [email or web address] 11/22/2019, 3:17 PM

## 2019-11-22 NOTE — TOC Progression Note (Addendum)
Transition of Care [facility]) - Progression Note    Patient Details  Name: Lori [name] MRN: [id number] Date of Birth: 03-Jan-1919  Transition of Care [facility]) CM/SW Contact  Bartholomew Crews, RN Phone Number: [phone] 11/22/2019, 10:38 AM  Clinical Narrative:     10:38 Spoke with Valentina Lucks on the phone this AM about choice of SNF. Family has elected [facility] in [place], however, after speaking with the liaison, [place], this SNF does not have a rehab bed until next week and no LTC beds at this time. [name] offered option of patient going to sister facility at [facility] in [place] for rehab with the option to transfer to [facility] for [place] when bed available. Spoke with [name] in person at the bedside to discuss this option, and spoke with Diona Fanti (dil) on the phone. After much contemplation the decision was made for [facility] with option to transfer at later date if desired. [name] notified of family decision. [facility] can accept patient today pending covid test result which is pending. Per [name] patient has not been vaccinated per patient's decision.  TOC following for transition needs.    Expected Discharge Plan: [place] Barriers to Discharge: Continued Medical Work up, SNF Pending bed offer  Expected Discharge Plan and Services Expected Discharge Plan: [place] In-house Referral: Clinical Social Work Discharge Planning Services: CM Consult Post Acute Care Choice: [place] Living arrangements for the past 2 months: Apartment                 DME Arranged: N/A DME Agency: NA       HH Arranged: NA HH Agency: NA         Social Determinants of Health (SDOH) Interventions    Readmission Risk Interventions No flowsheet data found.

## 2019-11-23 DIAGNOSIS — J9601 Acute respiratory failure with hypoxia: Secondary | ICD-10-CM | POA: Diagnosis not present

## 2019-11-23 DIAGNOSIS — L03116 Cellulitis of left lower limb: Secondary | ICD-10-CM | POA: Diagnosis not present

## 2019-11-23 DIAGNOSIS — Z96641 Presence of right artificial hip joint: Secondary | ICD-10-CM | POA: Diagnosis present

## 2019-11-23 DIAGNOSIS — Z7401 Bed confinement status: Secondary | ICD-10-CM | POA: Diagnosis not present

## 2019-11-23 DIAGNOSIS — R0902 Hypoxemia: Secondary | ICD-10-CM | POA: Diagnosis not present

## 2019-11-23 DIAGNOSIS — I5032 Chronic diastolic (congestive) heart failure: Secondary | ICD-10-CM | POA: Diagnosis not present

## 2019-11-23 DIAGNOSIS — Z209 Contact with and (suspected) exposure to unspecified communicable disease: Secondary | ICD-10-CM | POA: Diagnosis not present

## 2019-11-23 DIAGNOSIS — Z7901 Long term (current) use of anticoagulants: Secondary | ICD-10-CM | POA: Diagnosis not present

## 2019-11-23 DIAGNOSIS — Z91040 Latex allergy status: Secondary | ICD-10-CM | POA: Diagnosis not present

## 2019-11-23 DIAGNOSIS — I110 Hypertensive heart disease with heart failure: Secondary | ICD-10-CM | POA: Diagnosis not present

## 2019-11-23 DIAGNOSIS — Z87891 Personal history of nicotine dependence: Secondary | ICD-10-CM | POA: Diagnosis not present

## 2019-11-23 DIAGNOSIS — I872 Venous insufficiency (chronic) (peripheral): Secondary | ICD-10-CM | POA: Diagnosis not present

## 2019-11-23 DIAGNOSIS — R140 Abdominal distension (gaseous): Secondary | ICD-10-CM | POA: Diagnosis not present

## 2019-11-23 DIAGNOSIS — M25561 Pain in right knee: Secondary | ICD-10-CM | POA: Diagnosis not present

## 2019-11-23 DIAGNOSIS — I517 Cardiomegaly: Secondary | ICD-10-CM | POA: Diagnosis not present

## 2019-11-23 DIAGNOSIS — K2980 Duodenitis without bleeding: Secondary | ICD-10-CM | POA: Diagnosis not present

## 2019-11-23 DIAGNOSIS — M79662 Pain in left lower leg: Secondary | ICD-10-CM | POA: Diagnosis not present

## 2019-11-23 DIAGNOSIS — K5900 Constipation, unspecified: Secondary | ICD-10-CM | POA: Diagnosis not present

## 2019-11-23 DIAGNOSIS — G9341 Metabolic encephalopathy: Secondary | ICD-10-CM | POA: Diagnosis not present

## 2019-11-23 DIAGNOSIS — Z66 Do not resuscitate: Secondary | ICD-10-CM | POA: Diagnosis present

## 2019-11-23 DIAGNOSIS — E875 Hyperkalemia: Secondary | ICD-10-CM | POA: Diagnosis not present

## 2019-11-23 DIAGNOSIS — R339 Retention of urine, unspecified: Secondary | ICD-10-CM | POA: Diagnosis not present

## 2019-11-23 DIAGNOSIS — K219 Gastro-esophageal reflux disease without esophagitis: Secondary | ICD-10-CM | POA: Diagnosis present

## 2019-11-23 DIAGNOSIS — I714 Abdominal aortic aneurysm, without rupture: Secondary | ICD-10-CM | POA: Diagnosis not present

## 2019-11-23 DIAGNOSIS — R5383 Other fatigue: Secondary | ICD-10-CM | POA: Diagnosis not present

## 2019-11-23 DIAGNOSIS — J811 Chronic pulmonary edema: Secondary | ICD-10-CM | POA: Diagnosis not present

## 2019-11-23 DIAGNOSIS — J181 Lobar pneumonia, unspecified organism: Secondary | ICD-10-CM | POA: Diagnosis not present

## 2019-11-23 DIAGNOSIS — N83201 Unspecified ovarian cyst, right side: Secondary | ICD-10-CM | POA: Diagnosis present

## 2019-11-23 DIAGNOSIS — I509 Heart failure, unspecified: Secondary | ICD-10-CM | POA: Diagnosis not present

## 2019-11-23 DIAGNOSIS — R1084 Generalized abdominal pain: Secondary | ICD-10-CM | POA: Diagnosis not present

## 2019-11-23 DIAGNOSIS — F0390 Unspecified dementia without behavioral disturbance: Secondary | ICD-10-CM | POA: Diagnosis not present

## 2019-11-23 DIAGNOSIS — I480 Paroxysmal atrial fibrillation: Secondary | ICD-10-CM | POA: Diagnosis not present

## 2019-11-23 DIAGNOSIS — Z7982 Long term (current) use of aspirin: Secondary | ICD-10-CM | POA: Diagnosis not present

## 2019-11-23 DIAGNOSIS — F05 Delirium due to known physiological condition: Secondary | ICD-10-CM | POA: Diagnosis not present

## 2019-11-23 DIAGNOSIS — N184 Chronic kidney disease, stage 4 (severe): Secondary | ICD-10-CM | POA: Diagnosis not present

## 2019-11-23 DIAGNOSIS — R778 Other specified abnormalities of plasma proteins: Secondary | ICD-10-CM | POA: Diagnosis not present

## 2019-11-23 DIAGNOSIS — I10 Essential (primary) hypertension: Secondary | ICD-10-CM | POA: Diagnosis not present

## 2019-11-23 DIAGNOSIS — Z03818 Encounter for observation for suspected exposure to other biological agents ruled out: Secondary | ICD-10-CM | POA: Diagnosis not present

## 2019-11-23 DIAGNOSIS — J90 Pleural effusion, not elsewhere classified: Secondary | ICD-10-CM | POA: Diagnosis not present

## 2019-11-23 DIAGNOSIS — Z20822 Contact with and (suspected) exposure to covid-19: Secondary | ICD-10-CM | POA: Diagnosis present

## 2019-11-23 DIAGNOSIS — E785 Hyperlipidemia, unspecified: Secondary | ICD-10-CM | POA: Diagnosis present

## 2019-11-23 DIAGNOSIS — R52 Pain, unspecified: Secondary | ICD-10-CM | POA: Diagnosis not present

## 2019-11-23 DIAGNOSIS — Z801 Family history of malignant neoplasm of trachea, bronchus and lung: Secondary | ICD-10-CM | POA: Diagnosis not present

## 2019-11-23 DIAGNOSIS — R600 Localized edema: Secondary | ICD-10-CM | POA: Diagnosis not present

## 2019-11-23 DIAGNOSIS — I130 Hypertensive heart and chronic kidney disease with heart failure and stage 1 through stage 4 chronic kidney disease, or unspecified chronic kidney disease: Secondary | ICD-10-CM | POA: Diagnosis present

## 2019-11-23 DIAGNOSIS — R599 Enlarged lymph nodes, unspecified: Secondary | ICD-10-CM | POA: Diagnosis not present

## 2019-11-23 DIAGNOSIS — M6281 Muscle weakness (generalized): Secondary | ICD-10-CM | POA: Diagnosis not present

## 2019-11-23 DIAGNOSIS — R4182 Altered mental status, unspecified: Secondary | ICD-10-CM | POA: Diagnosis not present

## 2019-11-23 DIAGNOSIS — J189 Pneumonia, unspecified organism: Secondary | ICD-10-CM | POA: Diagnosis not present

## 2019-11-23 DIAGNOSIS — J810 Acute pulmonary edema: Secondary | ICD-10-CM | POA: Diagnosis not present

## 2019-11-23 DIAGNOSIS — I5031 Acute diastolic (congestive) heart failure: Secondary | ICD-10-CM | POA: Diagnosis not present

## 2019-11-23 DIAGNOSIS — I340 Nonrheumatic mitral (valve) insufficiency: Secondary | ICD-10-CM | POA: Diagnosis not present

## 2019-11-23 DIAGNOSIS — Z96652 Presence of left artificial knee joint: Secondary | ICD-10-CM | POA: Diagnosis present

## 2019-11-23 DIAGNOSIS — N390 Urinary tract infection, site not specified: Secondary | ICD-10-CM | POA: Diagnosis not present

## 2019-11-23 DIAGNOSIS — M2550 Pain in unspecified joint: Secondary | ICD-10-CM | POA: Diagnosis not present

## 2019-11-23 DIAGNOSIS — R2242 Localized swelling, mass and lump, left lower limb: Secondary | ICD-10-CM | POA: Diagnosis not present

## 2019-11-23 DIAGNOSIS — I5033 Acute on chronic diastolic (congestive) heart failure: Secondary | ICD-10-CM | POA: Diagnosis not present

## 2019-11-23 DIAGNOSIS — Z79899 Other long term (current) drug therapy: Secondary | ICD-10-CM | POA: Diagnosis not present

## 2019-11-23 DIAGNOSIS — Z91048 Other nonmedicinal substance allergy status: Secondary | ICD-10-CM | POA: Diagnosis not present

## 2019-11-23 DIAGNOSIS — R410 Disorientation, unspecified: Secondary | ICD-10-CM | POA: Diagnosis not present

## 2019-11-23 LAB — SARS CORONAVIRUS 2 (TAT 6-24 HRS): SARS Coronavirus 2: NEGATIVE

## 2019-11-23 MED ORDER — QUETIAPINE FUMARATE 25 MG PO TABS: 12.5000 mg | ORAL_TABLET | Freq: Every day | ORAL | 0 refills | Status: DC

## 2019-11-23 MED ORDER — ASPIRIN EC 81 MG PO TBEC
81.0000 mg | DELAYED_RELEASE_TABLET | Freq: Every day | ORAL | 2 refills | Status: DC
Start: 2019-11-23 — End: 2019-12-16

## 2019-11-23 MED ORDER — POLYETHYLENE GLYCOL 3350 17 G PO PACK: 17.0000 g | PACK | Freq: Every day | ORAL | 0 refills | Status: AC | PRN

## 2019-11-23 MED ORDER — ISOSORBIDE MONONITRATE ER 30 MG PO TB24: 15.0000 mg | ORAL_TABLET | Freq: Every day | ORAL | Status: DC

## 2019-11-23 NOTE — Consult Note (Signed)
   [facility] Inpatient Consult   11/23/2019  [name] [birth year] [id number]   [facility] ACO Patient:  Medicare Next Gen  Patient with a hx of [facility] Care management.  Chart review reveals patient is for a skilled nursing level of care at this time.  Chart reviewed briefly for post hospital follow up needs.  Was active with Manufacturing [facility], notes reviewed as well.  Primary Care Provider: Dr. Leanna Battles  Plan: Will notify [facility] PAC RN of SNF transition to [facility].  For questions, contact:  Natividad Brood, RN BSN [place] [facility] Liaison  [phone] business mobile phone Toll free office [phone]  Fax number: [phone] [name]

## 2019-11-23 NOTE — Discharge Summary (Addendum)
Physician Discharge Summary  Lori [name] MHD:[id number] DOB: 09-26-1918 DOA: 11/19/2019  PCP: Leanna Battles, MD  Admit date: 11/19/2019 Discharge date: 11/23/2019  Time spent: Fifty-five  Recommendations for Outpatient Follow-up:  1. Aspirin eighty-one, Pradaxa twice daily and metoprolol resumed this admission as going to skilled facility 2. Please consider dosing Lasix as a diuretic every other day 40 mg for weight gain above 2 pounds-would limit fluid intake to 1.3 L in the summer and 1 L in the winter given her preponderance/predisposition for lower extremity edema-I would liberalize her diet however given she is edentulous and has poor dentition and her BMI may drop because of this 3. Note discontinuation completely of Aldactone-rales trial did not comment on octogenarians or send urine and risks outweigh benefits 4. Recommend therapy at facility and recommend OP Pallaitvie care 5. Consider outpatient goals of care if not  Discharge Diagnoses:  Active Problems:   Atrial fibrillation (HCC)   Essential hypertension   Moderate mitral regurgitation   Chronic diastolic CHF (congestive heart failure) (HCC)   Acute lower UTI   Acute metabolic encephalopathy   Encephalopathy acute   Discharge Condition: Improved  Diet recommendation: Heart healthy  Filed Weights   11/20/19 2109  Weight: 81.6 kg    History of present illness:   86 year old dlightful fem  macular degeneration,  hard of hearing,  essential hypertension, paroxysmal atrial fibrillation,  chronic diastolic congestive heart failure,  moderate MR, chronic lower extremity edema secondary to CHF/lymphedema/venous insufficiency,  history of frequent falls who presents via EMS from home with episodes of delusion and paranoia.    was violent, confused and delusional.- calling 911 thinking the house was on fire and accusing family of trying to hurt her over the past 2 weeks.   Lori [name] reports episodes have been  worsening over the last 2 weeks and she was prescribed Haldol recently through hospice/her PCP and apparently not working for these episodes.    Foley catheter was placed 2 weeks ago for incontinence/skin breakdown.   Patient was hospitalized 09/18/2019 through 09/25/2019 for cellulitis.  [name] at time refused SNF and was therefore discharged home with home health.   Lori [name] provides care during the day and a private aide of a care for the patient at night.  Patient has home visits at least 3-4 times a week from hospice.  Patient currently ambulates with assistance of a walker.   In the ED, BP 124/64, HR 94, SPO2 92% on room air, RR 24.  WBC count 8.7, hemoglobin 10.5, platelets 331, sodium 138, potassium 4.5, BUN 26, creatinine 1.6, albumin 2.8.  LFTs within normal limits.  Urinalysis with large leukoesterase, negative nitrite, greater than 50 WBCs.  Urine culture: Pending.  Patient was started on ceftriaxone for UTI as leading cause of her acute delusional behavior.  TRH was consulted for admission.     Hospital Course:  Acute metabolic encephalopathy Delirium--RESOLVED Patient's confusional state is much better and she is coherent knows time date and person --Continue treatment as depicted below. --Would continue for now low-dose Seroquel 12.5 mg nightly-would recommend that this be titrated if needed either downwards or upwards   Serratia marcescens urinary tract infection Patient presenting with delusions, worsening over the past few weeks following Foley catheter insertion 2 weeks ago.  Urinalysis with large leukoesterase, negative nitrite and greater than 50 WBCs. --Urine culture: With Serratia marcescens which is pansensitive has received 4 days of IV ceftriaxone and this was promptly discontinued on 5/19-she has remained pleasant and  oriented and I feel that her metabolic encephalopathy was in part related to this   CKD stage IV Baseline creatinine 1.90-2.0 with a GFR of 22-3 23  over the past year.  Creatinine mission, now rising to 2.12.  On furosemide 40mg  PO daily and spironolactone on a Monday/Wednesday/Friday schedule at home. --All diuretics were held on admission --Creatinine down to 2.0 --Nursing home physician to consider reinitiation of diuretics based on labs in the next several days and fluid status lower extremity swelling   History of urinary retention Had Foley catheter placed 2 weeks ago.  Now developed UTI as above.  Foley catheter discontinued on admission.   Paroxysmal atrial fibrillation Patient previously on metoprolol/Pradaxa discontinued by home hospice.  EKG on admission notable for A. fib with RVR with rate 118. --Restarted metoprolol 25 mg p.o. twice daily --Aspirin switched back to 81 mg and resume Pradaxa-I discussed this clearly with her relative and risk-benefit analysis was discussed in detail regarding risk of falls and bleeding versus risk of stroke and they wished to resume Pradaxa --Continue to monitor on telemetry   Chronic diastolic congestive heart failure, compensated Moderate mitral regurgitation Essential hypertension Chest x-ray with stable bibasilar opacities, small bilateral pleural effusions, atelectasis and probable chronic ILD.  BNP elevated 654.  Oxygenating well on room air. --Discontinue home furosemide and spironolactone for now-as above discussion e --Metoprolol tartrate 25 mg p.o. twice daily --Stopped hydralazine which was started earlier, continue imdur 15 mg p.o. daily   Hypoalbuminemia Albumin 2.8, nutrition consulted for recommendations in regards to supplementation   Weakness/debility: Patient with multiple falls at home, likely compounded by her history of hard of hearing and macular degeneration     Discharge Exam: Vitals:   11/23/19 0500 11/23/19 0928  BP: 119/62 (!) 104/52  Pulse: 70 62  Resp: 16 18  Temp: 97.7 F (36.5 C) 97.6 F (36.4 C)  SpO2: 93% 96%   Patient is doing well today not  in distress she is coherent but at times seems to speak a little bit confused and seems to have some delusions of reference at times she is very redirectable however knows where she is is pleasant oriented and has had a good breakfast and has no complaints  General: EOMI NCAT mucosa moist neck soft supple moderate dentition Cardiovascular: S1-S2 no murmur rub or gallop PMI is nondisplaced patient in A. fib rate controlled in the 70s Respiratory: Clinically clear no added sound no rales no rhonchi Abdomen soft no rebound Mild lower extremity stasis dermatitis without any overt swelling neurologically intact however at times it does seem to become tangential  Discharge Instructions   Discharge Instructions    Diet - low sodium heart healthy   Complete by: As directed    Increase activity slowly   Complete by: As directed      Allergies as of 11/23/2019      Reactions   Dust Mite Extract Shortness Of Breath, Itching   Mold Extract [trichophyton Mentagrophyte] Shortness Of Breath, Itching, Cough   VERY ALLERGIC/EXPOSURE RESULTS IN TREMENDOUS COUGHING (just one symptom)   Latex Itching   And certain fabrics   Tape Other (See Comments)   SKIN IS VERY THIN AND WILL TEAR AND BRUISE EASILY!! PLEASE USE COBAN WRAP-      Medication List    STOP taking these medications   furosemide 20 MG tablet Commonly known as: LASIX   haloperidol 2 MG tablet Commonly known as: HALDOL   spironolactone 25 MG tablet Commonly  known as: ALDACTONE     TAKE these medications   aspirin EC 81 MG tablet Take 1 tablet (81 mg total) by mouth daily.   dabigatran 75 MG Caps capsule Commonly known as: Pradaxa TAKE 1 CAPSULE EVERY 12 HOURS   isosorbide mononitrate 30 MG 24 hr tablet Commonly known as: IMDUR Take 0.5 tablets (15 mg total) by mouth daily. Start taking on: Nov 24, 2019   metoprolol tartrate 25 MG tablet Commonly known as: LOPRESSOR Take 1 tablet (25 mg total) by mouth 2 (two) times  daily.   polyethylene glycol 17 g packet Commonly known as: MIRALAX / GLYCOLAX Take 17 g by mouth daily as needed for mild constipation.   QUEtiapine 25 MG tablet Commonly known as: SEROQUEL Take 0.5 tablets (12.5 mg total) by mouth at bedtime.      Allergies  Allergen Reactions  . Dust Mite Extract Shortness Of Breath and Itching  . Mold Extract [Trichophyton Mentagrophyte] Shortness Of Breath, Itching and Cough    VERY ALLERGIC/EXPOSURE RESULTS IN TREMENDOUS COUGHING (just one symptom)  . Latex Itching    And certain fabrics  . Tape Other (See Comments)    SKIN IS VERY THIN AND WILL TEAR AND BRUISE EASILY!! PLEASE USE COBAN WRAP-   Contact information for after-discharge care    Destination    [facility] Preferred SNF .   Service: Skilled Nursing Contact information: [address] [place] [phone]               The results of significant diagnostics from this hospitalization (including imaging, microbiology, ancillary and laboratory) are listed below for reference.    Significant Diagnostic Studies: DG Chest 2 View  Result Date: 11/19/2019 CLINICAL DATA:  Transient delirium, agitation. Rule out infection. EXAM: CHEST - 2 VIEW COMPARISON:  Chest x-rays dated 09/20/2019 and 02/25/2018. FINDINGS: Stable hazy opacity at the RIGHT lung base. Stable denser opacity at the LEFT lung base. Coarse interstitial markings are again seen throughout both lungs suggesting chronic interstitial lung disease. Stable mild cardiomegaly. No acute appearing osseous abnormality. IMPRESSION: 1. No significant change compared to most recent chest x-ray of 09/20/2019. Stable bibasilar opacities, at least some component due to small bilateral pleural effusions. Superimposed atelectasis is likely. 2. Stable mild cardiomegaly. 3. Probable chronic interstitial lung disease. Electronically Signed   By: Franki Cabot M.D.   On: 11/19/2019 10:48     Microbiology: Recent Results (from the past 240 hour(s))  Urine culture     Status: Abnormal   Collection Time: 11/19/19 10:47 AM   Specimen: Urine, Random  Result Value Ref Range Status   Specimen Description URINE, RANDOM  Final   Special Requests   Final    NONE Performed at [facility], [address], [place], [place] [address]    Culture >=100,000 COLONIES/mL SERRATIA MARCESCENS (A)  Final   Report Status 11/21/2019 FINAL  Final   Organism ID, Bacteria SERRATIA MARCESCENS (A)  Final      Susceptibility   Serratia marcescens - MIC*    CEFAZOLIN >=64 RESISTANT Resistant     CEFTRIAXONE <=1 SENSITIVE Sensitive     CIPROFLOXACIN <=0.25 SENSITIVE Sensitive     GENTAMICIN <=1 SENSITIVE Sensitive     NITROFURANTOIN 128 RESISTANT Resistant     TRIMETH/SULFA <=20 SENSITIVE Sensitive     * >=100,000 COLONIES/mL SERRATIA MARCESCENS  SARS CORONAVIRUS 2 (TAT 6-24 HRS) Nasopharyngeal Nasopharyngeal Swab     Status: None   Collection Time:  11/22/19 11:11 AM   Specimen: Nasopharyngeal Swab  Result Value Ref Range Status   SARS Coronavirus 2 NEGATIVE NEGATIVE Final    Comment: (NOTE) SARS-CoV-2 target nucleic acids are NOT DETECTED. The SARS-CoV-2 RNA is generally detectable in upper and lower respiratory specimens during the acute phase of infection. Negative results do not preclude SARS-CoV-2 infection, do not rule out co-infections with other pathogens, and should not be used as the sole basis for treatment or other patient management decisions. Negative results must be combined with clinical observations, patient history, and epidemiological information. The expected result is Negative. Fact Sheet for Patients: SugarRoll.be Fact Sheet for Healthcare Providers: https://www.woods-mathews.com/ This test is not yet approved or cleared by the [place] FDA and  has been authorized for detection and/or diagnosis of SARS-CoV-2  by FDA under an Emergency Use Authorization (EUA). This EUA will remain  in effect (meaning this test can be used) for the duration of the COVID-19 declaration under Section 56 4(b)(1) of the Act, 21 U.S.C. section 360bbb-3(b)(1), unless the authorization is terminated or revoked sooner. Performed at [facility], [place] [address] [place], [place] [address]      Labs: Basic Metabolic Panel: Recent Labs  Lab 11/19/19 1016 11/20/19 0456 11/21/19 0440 11/22/19 0451  NA 138 140 139 138  K 4.5 4.6 4.5 4.8  CL 102 104 102 100  CO2 26 27 26 23   GLUCOSE 108* 90 88 91  BUN 26* 26* 33* 41*  CREATININE 1.68* 1.77* 2.12* 2.08*  CALCIUM 8.9 9.0 8.6* 8.5*  MG  --  1.8 1.8 1.9   Liver Function Tests: Recent Labs  Lab 11/19/19 1016  AST 17  ALT 11  ALKPHOS 73  BILITOT 0.7  PROT 6.5  ALBUMIN 2.8*   No results for input(s): LIPASE, AMYLASE in the last 168 hours. No results for input(s): AMMONIA in the last 168 hours. CBC: Recent Labs  Lab 11/19/19 1016 11/20/19 0456 11/21/19 0440 11/22/19 0451  WBC 8.7 8.5 6.6 6.8  NEUTROABS 6.5  --   --   --   HGB 10.5* 10.9* 9.6* 9.6*  HCT 34.9* 36.8 32.2* 32.0*  MCV 66.5* 66.8* 66.9* 66.8*  PLT 331 337 271 273   Cardiac Enzymes: No results for input(s): CKTOTAL, CKMB, CKMBINDEX, TROPONINI in the last 168 hours. BNP: BNP (last 3 results) Recent Labs    09/20/19 0544 11/20/19 0456  BNP 482.3* 654.0*    ProBNP (last 3 results) No results for input(s): PROBNP in the last 8760 hours.  CBG: No results for input(s): GLUCAP in the last 168 hours.     Signed:  Nita Sells MD   [facility] 11/23/2019, 11:57 AM

## 2019-11-23 NOTE — Progress Notes (Signed)
[facility] Hospitalized patient visit.  [name] is a current hospice patient with terminal diagnosis ofDiastolic heart failure. Family activated EMS after patient became confused and combative. The family contacted hospice prior to contacting EMS but declined to wait for hospice to visit patient in the home. Patient was admitted to [facility] on 11/19/19 with a diagnosis of altered mental status and suspected UTI. Per Dr. Karie Georges with [facility] this is a related hospital admission.   Visited patient at bedside. No visitors present. Patient alert and oriented, sitting up in chair. She expresses concerns for her SNF placement at [facility] that she has had bad experiences prior at [facility]. Patient is also revoking hospice services. Paperwork done per TOC   VS: 97.6, 104/52, P 62, O2:96 RA I/O: 360/200  Abn Labs: BUN 41, Creatinine 2.08, Calcium 8.5, GFR 22, Hgb 9.6, HCT 32.0, MCV 66.8, MCH 20.0, MCHC 30, RDW 16.0   IV/PRN medications: D/C    Problem List: #Delirium/acute metabolic encephalopathy -Serratia marcescens urinary tract infection #CKD stage IV #Chronic DHF/RV failure/moderate MR,  #Hypoalbuminemia 2.8 #Weakness/debility: multiple falls at home.   Discharge Planning: Likely D/C today to [facility] for rehab. Will revoke hospice benefit while receiving rehab and have Palliative follow until rehab is complete then resume hospice in LTC.   Family contact: [name]- Could not be reached. Will try to follow up again this afternoon.  IDG: updated.  Goals of care: DNR.   Should patient need ambulance transport at discharge- please use [facility] as they contract this service for our active hospice patients.   Please call with any hospice related questions.

## 2019-11-23 NOTE — TOC Transition Note (Signed)
Transition of Care [facility]) - CM/SW Discharge Note   Patient Details  Name: Lori [name] MRN: [id number] Date of Birth: 1919-03-05  Transition of Care [facility]) CM/SW Contact:  Bartholomew Crews, RN Phone Number: [phone] 11/23/2019, 11:54 AM   Clinical Narrative:     Spoke with Danielle Rankin yesterday afternoon about facility offers. [name] wanting to find the best facility for patient and asked NCM to look into additional facility preferences. [name] reinforces that agitated behaviors that patient exhibited prior to admission were new behaviors as a result of her medical condition (UTI) and that patient had never had these type behaviors before. Discussed additional barrier of patient needing to transition from short term rehab to long term care.   Notified by patient's daughter in law, Maha Fischel, this morning about additional facilities to consider. Additional bed offers provided for [facility] and [facility]. [name] selected [facility]. Patient is medically ready to transition today. MD notified. Alma to provide room # and phone # for nursing to call report. PTAR to be arranged for patient transport to facility.   Final next level of care: Skilled Nursing Facility Barriers to Discharge: No Barriers Identified   Patient Goals and CMS Choice Patient states their goals for this hospitalization and ongoing recovery are:: to get rehab CMS Medicare.gov Compare Post Acute Care list provided to:: Patient Represenative (must comment)(Pauline Rutigliano) Choice offered to / list presented to : [facility] POA / Guardian(Paulline Karman)  Discharge Placement PASRR number recieved: 11/11/16 Existing PASRR number confirmed : 11/23/19          Patient chooses bed at: [facility] Patient to be transferred to facility by: [place] Name of family member notified: Diona Fanti Patient and family notified of of transfer: 11/23/19  Discharge Plan and Services In-house  Referral: Clinical Social Work Discharge Planning Services: [facility] Consult Post Acute Care Choice: [place]          DME Arranged: N/A DME Agency: NA       HH Arranged: NA HH Agency: NA        Social Determinants of Health (SDOH) Interventions     Readmission Risk Interventions No flowsheet data found.

## 2019-11-23 NOTE — TOC Transition Note (Signed)
Transition of Care [facility]) - CM/SW Discharge Note   Patient Details  Name: Lori [name] MRN: [id number] Date of Birth: Dec 24, 1918  Transition of Care [facility]) CM/SW Contact:  Bartholomew Crews, RN Phone Number: [phone] 11/23/2019, 2:06 PM   Clinical Narrative:     Notified by [facility] that patient will go to room 104p. Nurse to call report to [phone]. PTAR arranged for transport. Patient and family notified. Medical transport paperwork on chart.    Final next level of care: Skilled Nursing Facility Barriers to Discharge: No Barriers Identified   Patient Goals and CMS Choice Patient states their goals for this hospitalization and ongoing recovery are:: to get rehab CMS Medicare.gov Compare Post Acute Care list provided to:: Patient Represenative (must comment)(Pauline Storlie) Choice offered to / list presented to : [facility] POA / Guardian(Paulline Mandel)  Discharge Placement PASRR number recieved: 11/11/16 Existing PASRR number confirmed : 11/23/19          Patient chooses bed at: [facility] Patient to be transferred to facility by: [place] Name of family member notified: Diona Fanti Patient and family notified of of transfer: 11/23/19  Discharge Plan and Services In-house Referral: Clinical Social Work Discharge Planning Services: [employer] Consult Post Acute Care Choice: [place]          DME Arranged: N/A DME Agency: NA       HH Arranged: NA HH Agency: NA        Social Determinants of Health (SDOH) Interventions     Readmission Risk Interventions No flowsheet data found.

## 2019-11-23 NOTE — Progress Notes (Signed)
DISCHARGE NOTE SNF [name] to be discharged Skilled nursing facility per MD order. Patient/granddaughter verbalized understanding.  Skin clean, dry and intact without evidence of skin break down, no evidence of skin tears noted. IV catheter discontinued intact. Site without signs and symptoms of complications. Dressing and pressure applied. Pt denies pain at the site currently. No complaints noted.  Patient free of lines, drains, and wounds.   Discharge packet assembled. An After Visit Summary (AVS) was printed and given to the EMS personnel. Patient escorted via stretcher and discharged to [facility] via ambulance. Report called to accepting facility; all questions and concerns addressed.   Orville Govern, RN3

## 2019-11-27 ENCOUNTER — Other Ambulatory Visit: Payer: Self-pay

## 2019-11-27 ENCOUNTER — Non-Acute Institutional Stay: Payer: Medicare Other

## 2019-11-27 DIAGNOSIS — Z515 Encounter for palliative care: Secondary | ICD-10-CM

## 2019-11-28 NOTE — Progress Notes (Signed)
COMMUNITY PALLIATIVE CARE SW NOTE  PATIENT NAME: Lori [name] DOB: 1919-04-14 MRN: [id number]  PRIMARY CARE PROVIDER: Leanna Battles, MD  RESPONSIBLE PARTY:  Acct ID - Guarantor Home Phone Work Phone Relationship Acct Type  [id number] Darryl [phone]  Self P/F     [address] [place], [place] [address]     PLAN OF CARE and INTERVENTIONS:             1. GOALS OF CARE/ ADVANCE CARE PLANNING: The goal is for patient to receive physical therapy improve her strength. Patient is a DNR.  2. SOCIAL/EMOTIONAL/SPIRITUAL ASSESSMENT/ INTERVENTIONS:  SW completed face-to-face visit with patient at the facility ([facility]). Patient was sitting in a chair in her room. She denied pain. Patient reported that she felt physical therapy was going well. She expressed that it maybe time to remain in the facility for long-term care. She stated that it is getting harder to live alone and manage things. She feels it will be a hard adjustment sharing a room with someone, but feels this may be best for her. Patient expressed frustration with her son and daughter-in-law who came from [place] to visit with her recently.  Patient saw them through the window and desires not to see them again.  Patient shared her social history through stories of her life. In those stories she shared her disappointments and regrets. She repeatedly stated she wished she had a better relationship with her son. SW provided active and reflective listening, supportive presence, assessment of needs and comfort, observation, processed feelings and consulted with staff. 3. PATIENT/CAREGIVER EDUCATION/ COPING: Patient appears to be coping well. Patient remembered SW from previous palliative care visit when she was at home. Patient was open and receptive to ongoing visits and support by the palliative care team.  4. PERSONAL EMERGENCY PLAN:  Per facility protocol.  5. COMMUNITY RESOURCES COORDINATION/ HEALTH CARE  NAVIGATION:  Patient is currently receiving physical therapy and is progressing well.  6. FINANCIAL/LEGAL CONCERNS/INTERVENTIONS:  No financial or legal issues presented.     SOCIAL HX:  Social History   Tobacco Use  . Smoking status: Former Smoker    Packs/day: 1.00    Years: 10.00    Pack years: 10.00    Quit date: 07/28/1959    Years since quitting: 60.3  . Smokeless tobacco: Former Systems developer    Quit date: 11/19/1959  Substance Use Topics  . Alcohol use: No    CODE STATUS: DNR ADVANCED DIRECTIVES: Yes MOST FORM COMPLETE:  No HOSPICE EDUCATION PROVIDED: No  PPS: Patient ambulates independently with a walker but remains at risk with falls. She is receiving physical therapy and progressing well. Patient is alert and oriented x3.   Duration of visit and documentation: 60 minutes        Katheren Puller, LCSW

## 2019-11-30 DIAGNOSIS — I10 Essential (primary) hypertension: Secondary | ICD-10-CM | POA: Diagnosis not present

## 2019-11-30 DIAGNOSIS — N184 Chronic kidney disease, stage 4 (severe): Secondary | ICD-10-CM | POA: Diagnosis not present

## 2019-11-30 DIAGNOSIS — I5032 Chronic diastolic (congestive) heart failure: Secondary | ICD-10-CM | POA: Diagnosis not present

## 2019-11-30 DIAGNOSIS — I480 Paroxysmal atrial fibrillation: Secondary | ICD-10-CM | POA: Diagnosis not present

## 2019-12-01 DIAGNOSIS — N184 Chronic kidney disease, stage 4 (severe): Secondary | ICD-10-CM | POA: Diagnosis not present

## 2019-12-01 DIAGNOSIS — M6281 Muscle weakness (generalized): Secondary | ICD-10-CM | POA: Diagnosis not present

## 2019-12-01 DIAGNOSIS — R600 Localized edema: Secondary | ICD-10-CM | POA: Diagnosis not present

## 2019-12-01 DIAGNOSIS — I480 Paroxysmal atrial fibrillation: Secondary | ICD-10-CM | POA: Diagnosis not present

## 2019-12-01 DIAGNOSIS — Z7901 Long term (current) use of anticoagulants: Secondary | ICD-10-CM | POA: Diagnosis not present

## 2019-12-01 DIAGNOSIS — M79662 Pain in left lower leg: Secondary | ICD-10-CM | POA: Diagnosis not present

## 2019-12-01 DIAGNOSIS — L03116 Cellulitis of left lower limb: Secondary | ICD-10-CM | POA: Diagnosis not present

## 2019-12-08 DIAGNOSIS — R600 Localized edema: Secondary | ICD-10-CM | POA: Diagnosis not present

## 2019-12-08 DIAGNOSIS — I5032 Chronic diastolic (congestive) heart failure: Secondary | ICD-10-CM | POA: Diagnosis not present

## 2019-12-11 ENCOUNTER — Emergency Department (HOSPITAL_COMMUNITY): Payer: Medicare Other

## 2019-12-11 ENCOUNTER — Other Ambulatory Visit: Payer: Self-pay

## 2019-12-11 ENCOUNTER — Inpatient Hospital Stay (HOSPITAL_COMMUNITY)
Admission: EM | Admit: 2019-12-11 | Discharge: 2019-12-16 | DRG: 291 | Disposition: A | Discharge status: Skilled Nursing Facility | Payer: Medicare Other | Source: Skilled Nursing Facility | Attending: Internal Medicine | Admitting: Internal Medicine

## 2019-12-11 DIAGNOSIS — Z03818 Encounter for observation for suspected exposure to other biological agents ruled out: Secondary | ICD-10-CM | POA: Diagnosis not present

## 2019-12-11 DIAGNOSIS — Z96652 Presence of left artificial knee joint: Secondary | ICD-10-CM | POA: Diagnosis present

## 2019-12-11 DIAGNOSIS — K219 Gastro-esophageal reflux disease without esophagitis: Secondary | ICD-10-CM | POA: Diagnosis present

## 2019-12-11 DIAGNOSIS — K2980 Duodenitis without bleeding: Secondary | ICD-10-CM | POA: Diagnosis not present

## 2019-12-11 DIAGNOSIS — Z87891 Personal history of nicotine dependence: Secondary | ICD-10-CM | POA: Diagnosis not present

## 2019-12-11 DIAGNOSIS — M2550 Pain in unspecified joint: Secondary | ICD-10-CM | POA: Diagnosis not present

## 2019-12-11 DIAGNOSIS — N83201 Unspecified ovarian cyst, right side: Secondary | ICD-10-CM | POA: Diagnosis present

## 2019-12-11 DIAGNOSIS — I714 Abdominal aortic aneurysm, without rupture, unspecified: Secondary | ICD-10-CM

## 2019-12-11 DIAGNOSIS — Z20822 Contact with and (suspected) exposure to covid-19: Secondary | ICD-10-CM | POA: Diagnosis present

## 2019-12-11 DIAGNOSIS — Z801 Family history of malignant neoplasm of trachea, bronchus and lung: Secondary | ICD-10-CM

## 2019-12-11 DIAGNOSIS — I509 Heart failure, unspecified: Secondary | ICD-10-CM

## 2019-12-11 DIAGNOSIS — Z91048 Other nonmedicinal substance allergy status: Secondary | ICD-10-CM

## 2019-12-11 DIAGNOSIS — J90 Pleural effusion, not elsewhere classified: Secondary | ICD-10-CM | POA: Diagnosis not present

## 2019-12-11 DIAGNOSIS — R531 Weakness: Secondary | ICD-10-CM | POA: Diagnosis not present

## 2019-12-11 DIAGNOSIS — Z96641 Presence of right artificial hip joint: Secondary | ICD-10-CM | POA: Diagnosis present

## 2019-12-11 DIAGNOSIS — M6281 Muscle weakness (generalized): Secondary | ICD-10-CM | POA: Diagnosis not present

## 2019-12-11 DIAGNOSIS — R1084 Generalized abdominal pain: Secondary | ICD-10-CM | POA: Diagnosis not present

## 2019-12-11 DIAGNOSIS — I480 Paroxysmal atrial fibrillation: Secondary | ICD-10-CM | POA: Diagnosis present

## 2019-12-11 DIAGNOSIS — R778 Other specified abnormalities of plasma proteins: Secondary | ICD-10-CM

## 2019-12-11 DIAGNOSIS — E875 Hyperkalemia: Secondary | ICD-10-CM

## 2019-12-11 DIAGNOSIS — Z7982 Long term (current) use of aspirin: Secondary | ICD-10-CM

## 2019-12-11 DIAGNOSIS — R0902 Hypoxemia: Secondary | ICD-10-CM | POA: Diagnosis not present

## 2019-12-11 DIAGNOSIS — R339 Retention of urine, unspecified: Secondary | ICD-10-CM | POA: Diagnosis not present

## 2019-12-11 DIAGNOSIS — Z79899 Other long term (current) drug therapy: Secondary | ICD-10-CM

## 2019-12-11 DIAGNOSIS — I10 Essential (primary) hypertension: Secondary | ICD-10-CM | POA: Diagnosis not present

## 2019-12-11 DIAGNOSIS — J45909 Unspecified asthma, uncomplicated: Secondary | ICD-10-CM | POA: Diagnosis present

## 2019-12-11 DIAGNOSIS — J189 Pneumonia, unspecified organism: Secondary | ICD-10-CM | POA: Diagnosis present

## 2019-12-11 DIAGNOSIS — Z7401 Bed confinement status: Secondary | ICD-10-CM | POA: Diagnosis not present

## 2019-12-11 DIAGNOSIS — I340 Nonrheumatic mitral (valve) insufficiency: Secondary | ICD-10-CM | POA: Diagnosis not present

## 2019-12-11 DIAGNOSIS — I5031 Acute diastolic (congestive) heart failure: Secondary | ICD-10-CM | POA: Diagnosis not present

## 2019-12-11 DIAGNOSIS — Z7901 Long term (current) use of anticoagulants: Secondary | ICD-10-CM

## 2019-12-11 DIAGNOSIS — J810 Acute pulmonary edema: Secondary | ICD-10-CM | POA: Diagnosis not present

## 2019-12-11 DIAGNOSIS — G9341 Metabolic encephalopathy: Secondary | ICD-10-CM | POA: Diagnosis present

## 2019-12-11 DIAGNOSIS — I5032 Chronic diastolic (congestive) heart failure: Secondary | ICD-10-CM | POA: Diagnosis not present

## 2019-12-11 DIAGNOSIS — F0390 Unspecified dementia without behavioral disturbance: Secondary | ICD-10-CM | POA: Diagnosis not present

## 2019-12-11 DIAGNOSIS — R4182 Altered mental status, unspecified: Secondary | ICD-10-CM | POA: Diagnosis not present

## 2019-12-11 DIAGNOSIS — H9193 Unspecified hearing loss, bilateral: Secondary | ICD-10-CM | POA: Diagnosis not present

## 2019-12-11 DIAGNOSIS — R7989 Other specified abnormal findings of blood chemistry: Secondary | ICD-10-CM

## 2019-12-11 DIAGNOSIS — I4891 Unspecified atrial fibrillation: Secondary | ICD-10-CM | POA: Diagnosis present

## 2019-12-11 DIAGNOSIS — R5383 Other fatigue: Secondary | ICD-10-CM | POA: Diagnosis not present

## 2019-12-11 DIAGNOSIS — I872 Venous insufficiency (chronic) (peripheral): Secondary | ICD-10-CM | POA: Diagnosis present

## 2019-12-11 DIAGNOSIS — F05 Delirium due to known physiological condition: Secondary | ICD-10-CM | POA: Diagnosis not present

## 2019-12-11 DIAGNOSIS — N184 Chronic kidney disease, stage 4 (severe): Secondary | ICD-10-CM | POA: Diagnosis present

## 2019-12-11 DIAGNOSIS — R140 Abdominal distension (gaseous): Secondary | ICD-10-CM | POA: Diagnosis not present

## 2019-12-11 DIAGNOSIS — I517 Cardiomegaly: Secondary | ICD-10-CM | POA: Diagnosis not present

## 2019-12-11 DIAGNOSIS — J9601 Acute respiratory failure with hypoxia: Secondary | ICD-10-CM | POA: Diagnosis not present

## 2019-12-11 DIAGNOSIS — Z66 Do not resuscitate: Secondary | ICD-10-CM | POA: Diagnosis present

## 2019-12-11 DIAGNOSIS — I110 Hypertensive heart disease with heart failure: Secondary | ICD-10-CM | POA: Diagnosis not present

## 2019-12-11 DIAGNOSIS — I130 Hypertensive heart and chronic kidney disease with heart failure and stage 1 through stage 4 chronic kidney disease, or unspecified chronic kidney disease: Principal | ICD-10-CM | POA: Diagnosis present

## 2019-12-11 DIAGNOSIS — E785 Hyperlipidemia, unspecified: Secondary | ICD-10-CM | POA: Diagnosis present

## 2019-12-11 DIAGNOSIS — I272 Pulmonary hypertension, unspecified: Secondary | ICD-10-CM | POA: Diagnosis present

## 2019-12-11 DIAGNOSIS — J811 Chronic pulmonary edema: Secondary | ICD-10-CM | POA: Diagnosis not present

## 2019-12-11 DIAGNOSIS — N390 Urinary tract infection, site not specified: Secondary | ICD-10-CM | POA: Diagnosis not present

## 2019-12-11 DIAGNOSIS — I5033 Acute on chronic diastolic (congestive) heart failure: Secondary | ICD-10-CM

## 2019-12-11 DIAGNOSIS — Z91040 Latex allergy status: Secondary | ICD-10-CM | POA: Diagnosis not present

## 2019-12-11 DIAGNOSIS — R278 Other lack of coordination: Secondary | ICD-10-CM | POA: Diagnosis not present

## 2019-12-11 DIAGNOSIS — R2689 Other abnormalities of gait and mobility: Secondary | ICD-10-CM | POA: Diagnosis not present

## 2019-12-11 DIAGNOSIS — I082 Rheumatic disorders of both aortic and tricuspid valves: Secondary | ICD-10-CM | POA: Diagnosis present

## 2019-12-11 DIAGNOSIS — K5900 Constipation, unspecified: Secondary | ICD-10-CM | POA: Diagnosis not present

## 2019-12-11 DIAGNOSIS — R410 Disorientation, unspecified: Secondary | ICD-10-CM | POA: Diagnosis not present

## 2019-12-11 DIAGNOSIS — J181 Lobar pneumonia, unspecified organism: Secondary | ICD-10-CM | POA: Diagnosis not present

## 2019-12-11 LAB — HEPATIC FUNCTION PANEL
ALT: 14 U/L (ref 0–44)
AST: 19 U/L (ref 15–41)
Albumin: 3 g/dL — ABNORMAL LOW (ref 3.5–5.0)
Alkaline Phosphatase: 72 U/L (ref 38–126)
Bilirubin, Direct: 0.2 mg/dL (ref 0.0–0.2)
Indirect Bilirubin: 0.8 mg/dL (ref 0.3–0.9)
Total Bilirubin: 1 mg/dL (ref 0.3–1.2)
Total Protein: 6.6 g/dL (ref 6.5–8.1)

## 2019-12-11 LAB — URINALYSIS, ROUTINE W REFLEX MICROSCOPIC
Bilirubin Urine: NEGATIVE
Glucose, UA: NEGATIVE mg/dL
Hgb urine dipstick: NEGATIVE
Ketones, ur: 5 mg/dL — AB
Leukocytes,Ua: NEGATIVE
Nitrite: NEGATIVE
Protein, ur: 30 mg/dL — AB
Specific Gravity, Urine: 1.024 (ref 1.005–1.030)
pH: 5 (ref 5.0–8.0)

## 2019-12-11 LAB — BASIC METABOLIC PANEL
Anion gap: 12 (ref 5–15)
BUN: 49 mg/dL — ABNORMAL HIGH (ref 8–23)
CO2: 19 mmol/L — ABNORMAL LOW (ref 22–32)
Calcium: 9.1 mg/dL (ref 8.9–10.3)
Chloride: 109 mmol/L (ref 98–111)
Creatinine, Ser: 1.79 mg/dL — ABNORMAL HIGH (ref 0.44–1.00)
GFR calc Af Amer: 27 mL/min — ABNORMAL LOW (ref >60)
GFR calc non Af Amer: 23 mL/min — ABNORMAL LOW (ref >60)
Glucose, Bld: 99 mg/dL (ref 70–99)
Potassium: 5.9 mmol/L — ABNORMAL HIGH (ref 3.5–5.1)
Sodium: 140 mmol/L (ref 135–145)

## 2019-12-11 LAB — CBC WITH DIFFERENTIAL/PLATELET
Abs Immature Granulocytes: 0.06 10*3/uL (ref 0.00–0.07)
Basophils Absolute: 0.1 10*3/uL (ref 0.0–0.1)
Basophils Relative: 1 %
Eosinophils Absolute: 0 10*3/uL (ref 0.0–0.5)
Eosinophils Relative: 0 %
HCT: 37.6 % (ref 36.0–46.0)
Hemoglobin: 11.2 g/dL — ABNORMAL LOW (ref 12.0–15.0)
Immature Granulocytes: 1 %
Lymphocytes Relative: 8 %
Lymphs Abs: 0.9 10*3/uL (ref 0.7–4.0)
MCH: 19.8 pg — ABNORMAL LOW (ref 26.0–34.0)
MCHC: 29.8 g/dL — ABNORMAL LOW (ref 30.0–36.0)
MCV: 66.3 fL — ABNORMAL LOW (ref 80.0–100.0)
Monocytes Absolute: 1.2 10*3/uL — ABNORMAL HIGH (ref 0.1–1.0)
Monocytes Relative: 11 %
Neutro Abs: 9 10*3/uL — ABNORMAL HIGH (ref 1.7–7.7)
Neutrophils Relative %: 79 %
Platelets: 253 10*3/uL (ref 150–400)
RBC: 5.67 MIL/uL — ABNORMAL HIGH (ref 3.87–5.11)
RDW: 18.3 % — ABNORMAL HIGH (ref 11.5–15.5)
WBC: 11.2 10*3/uL — ABNORMAL HIGH (ref 4.0–10.5)
nRBC: 0.3 % — ABNORMAL HIGH (ref 0.0–0.2)

## 2019-12-11 LAB — SARS CORONAVIRUS 2 BY RT PCR (HOSPITAL ORDER, PERFORMED IN ~~LOC~~ HOSPITAL LAB): SARS Coronavirus 2: NEGATIVE

## 2019-12-11 LAB — TROPONIN I (HIGH SENSITIVITY)
Troponin I (High Sensitivity): 189 ng/L (ref <18)
Troponin I (High Sensitivity): 170 ng/L (ref <18)

## 2019-12-11 LAB — LACTIC ACID, PLASMA: Lactic Acid, Venous: 1.5 mmol/L (ref 0.5–1.9)

## 2019-12-11 LAB — LIPASE, BLOOD: Lipase: 34 U/L (ref 11–51)

## 2019-12-11 LAB — POTASSIUM: Potassium: 5.8 mmol/L — ABNORMAL HIGH (ref 3.5–5.1)

## 2019-12-11 LAB — BRAIN NATRIURETIC PEPTIDE: B Natriuretic Peptide: 930.5 pg/mL — ABNORMAL HIGH (ref 0.0–100.0)

## 2019-12-11 MED ORDER — IPRATROPIUM-ALBUTEROL 0.5-2.5 (3) MG/3ML IN SOLN
3.0000 mL | Freq: Four times a day (QID) | RESPIRATORY_TRACT | Status: DC
  Administered 2019-12-11: 3 mL via RESPIRATORY_TRACT
  Filled 2019-12-11: qty 3

## 2019-12-11 MED ORDER — FUROSEMIDE 10 MG/ML IJ SOLN
40.0000 mg | Freq: Every day | INTRAMUSCULAR | Status: DC
  Administered 2019-12-12: 40 mg via INTRAVENOUS
  Filled 2019-12-11: qty 4

## 2019-12-11 MED ORDER — METRONIDAZOLE IN NACL 5-0.79 MG/ML-% IV SOLN
500.0000 mg | Freq: Three times a day (TID) | INTRAVENOUS | Status: DC
  Administered 2019-12-11 – 2019-12-14 (×8): 500 mg via INTRAVENOUS
  Filled 2019-12-11 (×8): qty 100

## 2019-12-11 MED ORDER — ASPIRIN EC 81 MG PO TBEC
81.0000 mg | DELAYED_RELEASE_TABLET | Freq: Every day | ORAL | Status: DC
  Administered 2019-12-12 – 2019-12-16 (×5): 81 mg via ORAL
  Filled 2019-12-11 (×5): qty 1

## 2019-12-11 MED ORDER — SODIUM CHLORIDE 0.9 % IV BOLUS
500.0000 mL | Freq: Once | INTRAVENOUS | Status: AC
  Administered 2019-12-11: 500 mL via INTRAVENOUS

## 2019-12-11 MED ORDER — SODIUM CHLORIDE 0.9 % IV SOLN
1.0000 g | INTRAVENOUS | Status: DC
  Administered 2019-12-12 – 2019-12-13 (×2): 1 g via INTRAVENOUS
  Filled 2019-12-11 (×2): qty 10

## 2019-12-11 MED ORDER — SODIUM CHLORIDE 0.9 % IV SOLN
500.0000 mg | Freq: Once | INTRAVENOUS | Status: AC
  Administered 2019-12-11: 500 mg via INTRAVENOUS
  Filled 2019-12-11: qty 500

## 2019-12-11 MED ORDER — QUETIAPINE FUMARATE 25 MG PO TABS
12.5000 mg | ORAL_TABLET | Freq: Every day | ORAL | Status: DC
  Administered 2019-12-12 – 2019-12-15 (×4): 12.5 mg via ORAL
  Filled 2019-12-11 (×5): qty 1

## 2019-12-11 MED ORDER — SODIUM CHLORIDE 0.9 % IV SOLN
500.0000 mg | INTRAVENOUS | Status: DC
  Administered 2019-12-12 – 2019-12-13 (×2): 500 mg via INTRAVENOUS
  Filled 2019-12-11 (×2): qty 500

## 2019-12-11 MED ORDER — SODIUM CHLORIDE 0.9 % IV SOLN
1.0000 g | Freq: Once | INTRAVENOUS | Status: AC
  Administered 2019-12-11: 1 g via INTRAVENOUS
  Filled 2019-12-11: qty 10

## 2019-12-11 MED ORDER — METOPROLOL TARTRATE 25 MG PO TABS
25.0000 mg | ORAL_TABLET | Freq: Two times a day (BID) | ORAL | Status: DC
  Administered 2019-12-11 – 2019-12-16 (×9): 25 mg via ORAL
  Filled 2019-12-11 (×10): qty 1

## 2019-12-11 MED ORDER — DABIGATRAN ETEXILATE MESYLATE 75 MG PO CAPS
75.0000 mg | ORAL_CAPSULE | Freq: Two times a day (BID) | ORAL | Status: DC
  Administered 2019-12-11 – 2019-12-16 (×10): 75 mg via ORAL
  Filled 2019-12-11 (×11): qty 1

## 2019-12-11 MED ORDER — SODIUM ZIRCONIUM CYCLOSILICATE 5 G PO PACK
5.0000 g | PACK | Freq: Once | ORAL | Status: AC
  Administered 2019-12-11: 5 g via ORAL
  Filled 2019-12-11: qty 1

## 2019-12-11 MED ORDER — ASPIRIN 81 MG PO CHEW
324.0000 mg | CHEWABLE_TABLET | Freq: Once | ORAL | Status: AC
  Administered 2019-12-11: 324 mg via ORAL
  Filled 2019-12-11: qty 4

## 2019-12-11 MED ORDER — ISOSORBIDE MONONITRATE ER 30 MG PO TB24
15.0000 mg | ORAL_TABLET | Freq: Every day | ORAL | Status: DC
  Administered 2019-12-12 – 2019-12-16 (×4): 15 mg via ORAL
  Filled 2019-12-11 (×5): qty 1

## 2019-12-11 MED ORDER — FUROSEMIDE 10 MG/ML IJ SOLN
40.0000 mg | Freq: Once | INTRAMUSCULAR | Status: AC
  Administered 2019-12-11: 40 mg via INTRAVENOUS
  Filled 2019-12-11: qty 4

## 2019-12-11 NOTE — ED Triage Notes (Signed)
Pt BIB [facility] from [facility]. Family reported pt was altered, did not elaborate how she was "acting different." Staff reports pt mentating per her baseline.

## 2019-12-11 NOTE — ED Provider Notes (Signed)
[facility] EMERGENCY DEPARTMENT Provider Note   CSN: [id number] Arrival date & time: 12/11/19  1323     History Chief Complaint  Patient presents with  . Altered Mental Status    Lori [name] is a 86 y.o. female.  Patient is a 86 year old female with past medical history of chronic renal insufficiency, CHF, pulmonary hypertension.  Patient sent from her extended care facility for evaluation of altered mental status.  From what was conveyed to me, the family feels though she is "acting differently", however nursing home staff does not feel she is off of her baseline.  Patient denies to me she is experiencing any new symptoms.  She denies any headache, weakness, numbness, difficulty breathing, or other complaints.  She does only describe some constipation.  The history is provided by the patient.       Past Medical History:  Diagnosis Date  . Anemia   . Arthritis   . Asthma   . Cellulitis   . Chronic acquired lymphedema   . Chronic kidney disease   . Chronic venous insufficiency   . Dysrhythmia   . GERD (gastroesophageal reflux disease)   . Heart murmur   . Hyperlipidemia   . Hypertension     Patient Active Problem List   Diagnosis Date Noted  . Acute lower UTI 11/19/2019  . Acute metabolic encephalopathy 28/36/6294  . Encephalopathy acute 11/19/2019  . Cellulitis 02/26/2018  . Chronic diastolic CHF (congestive heart failure) ([place]) 02/26/2018  . Chronic venous insufficiency 08/17/2017  . CKD (chronic kidney disease), stage IV ([place]) 08/17/2017  . Moderate mitral regurgitation 11/04/2016  . Elevated brain natriuretic peptide (BNP) level 11/04/2016  . Pulmonary HTN ([place]) 11/04/2016  . Hyponatremia 07/13/2012    Class: Acute  . Essential hypertension 03/31/2012  . Atrial fibrillation ([place]) 07/29/2011  . Peripheral edema 07/29/2011  . Cough 06/26/2010  . ANEMIA, IRON DEFICIENCY 06/10/2010  . ANEMIA-UNSPECIFIED 06/10/2010  . Asthma 06/10/2010    . PERSONAL HX COLONIC POLYPS 06/10/2010    Past Surgical History:  Procedure Laterality Date  . JOINT REPLACEMENT    . left total knee replacement    . right total hip replacement    . TONSILLECTOMY       OB History   No obstetric history on file.     Family History  Problem Relation Age of Onset  . Lung cancer Mother   . Other Father        unknown causes  . Breast cancer Neg Hx   . Diabetes Neg Hx     Social History   Tobacco Use  . Smoking status: Former Smoker    Packs/day: 1.00    Years: 10.00    Pack years: 10.00    Quit date: 07/28/1959    Years since quitting: 60.4  . Smokeless tobacco: Former Systems developer    Quit date: 11/19/1959  Substance Use Topics  . Alcohol use: No  . Drug use: No    Home Medications Prior to Admission medications   Medication Sig Start Date End Date Taking? Authorizing Provider  aspirin EC 81 MG tablet Take 1 tablet (81 mg total) by mouth daily. 11/23/19 11/22/20  Nita Sells, MD  dabigatran (PRADAXA) 75 MG CAPS capsule TAKE 1 CAPSULE EVERY 12 HOURS Patient not taking: Reported on 11/19/2019 05/02/18   Evans Lance, MD  isosorbide mononitrate (IMDUR) 30 MG 24 hr tablet Take 0.5 tablets (15 mg total) by mouth daily. 11/24/19   Nita Sells, MD  metoprolol tartrate (LOPRESSOR) 25 MG tablet Take 1 tablet (25 mg total) by mouth 2 (two) times daily. Patient not taking: Reported on 11/19/2019 11/27/16   Evans Lance, MD  polyethylene glycol (MIRALAX / GLYCOLAX) 17 g packet Take 17 g by mouth daily as needed for mild constipation. 11/23/19   Nita Sells, MD  QUEtiapine (SEROQUEL) 25 MG tablet Take 0.5 tablets (12.5 mg total) by mouth at bedtime. 11/23/19   Nita Sells, MD    Allergies    Dust mite extract, Mold extract [trichophyton mentagrophyte], Latex, and Tape  Review of Systems   Review of Systems  All other systems reviewed and are negative.   Physical Exam Updated Vital Signs BP (!) 152/91 (BP  Location: Right Arm)   Pulse (!) 102   Temp 98.4 F (36.9 C) (Oral)   Resp (!) 28   Ht 5\' 4"  (1.626 m)   Wt 81.6 kg   SpO2 97%   BMI 30.90 kg/m   Physical Exam Vitals and nursing note reviewed.  Constitutional:      General: She is not in acute distress.    Appearance: She is well-developed. She is not diaphoretic.  HENT:     Head: Normocephalic and atraumatic.  Cardiovascular:     Rate and Rhythm: Normal rate and regular rhythm.     Heart sounds: No murmur. No friction rub. No gallop.   Pulmonary:     Effort: Pulmonary effort is normal. No respiratory distress.     Breath sounds: Normal breath sounds. No wheezing.  Abdominal:     General: Bowel sounds are normal. There is no distension.     Palpations: Abdomen is soft.     Tenderness: There is no abdominal tenderness.  Musculoskeletal:        General: Normal range of motion.     Cervical back: Normal range of motion and neck supple.  Skin:    General: Skin is warm and dry.  Neurological:     General: No focal deficit present.     Mental Status: She is alert and oriented to person, place, and time.     Cranial Nerves: No cranial nerve deficit.     Motor: No weakness.     ED Results / Procedures / Treatments   Labs (all labs ordered are listed, but only abnormal results are displayed) Labs Reviewed  BASIC METABOLIC PANEL  CBC WITH DIFFERENTIAL/PLATELET  URINALYSIS, ROUTINE W REFLEX MICROSCOPIC    EKG None  Radiology No results found.  Procedures Procedures (including critical care time)  Medications Ordered in ED Medications  sodium chloride 0.9 % bolus 500 mL (has no administration in time range)    ED Course  I have reviewed the triage vital signs and the nursing notes.  Pertinent labs & imaging results that were available during my care of the patient were reviewed by me and considered in my medical decision making (see chart for details).    MDM Rules/Calculators/A&P  Patient sent here from  her extended care facility for evaluation of mental status change.  According to family who spoke with her over the phone, she appeared more confused than normal.  Patient arrives here without complaints and tells me that she feels well.  She told me that she did not know why she was here.  Patient's vital signs stable upon presentation and work-up shows perhaps mild dehydration.  She also has a potassium of 5.9.  Whether this is a hemolyzed specimen or true result, I am  uncertain.  This will be repeated.  Patient will undergo chest x-ray, CT scan of her head, CT scan of the abdomen and pelvis.  Results of this are pending.  I had a lengthy discussion with 2 members of this patient's family.  The family member most familiar with her is named [name] and she is medical power of attorney.  She tells me that there have been concerns at her extended care facility that her abdomen is distended, she has not been eating or drinking for the past several days, and she appears more confused.  There are other concerns of having recurrent cellulitis in her legs and concerns over being prescribed Seroquel for confusion when her issues were related to a UTI.  Her urinalysis today is clear and I personally removed the leg wraps and saw no evidence for cellulitis.  Care will be signed out to Dr. Billy Fischer at shift change.  She will obtain the results of the above tests and determine the final disposition.  Final Clinical Impression(s) / ED Diagnoses Final diagnoses:  None    Rx / DC Orders ED Discharge Orders    None       Veryl Speak, MD 12/11/19 [phone]

## 2019-12-11 NOTE — ED Provider Notes (Signed)
  Physical Exam  BP (!) 146/86 (BP Location: Right Arm)   Pulse (!) 115   Temp 98.4 F (36.9 C) (Oral)   Resp 17   Ht 5\' 4"  (1.626 m)   Wt 81.6 kg   SpO2 94%   BMI 30.90 kg/m   Physical Exam  ED Course/Procedures     Procedures  MDM    Received care of patient from Dr. Lanny Cramp.  Please see his note for prior history, physical and care.  Briefly this is a 86 year old female with a history of CKD, hypertension, hyperlipidemia, asthma, chronic diastolic CHF, pulmonary hypertension, atrial fibrillation, who presents with generalized weakness, low appetite, abdominal distention per family.  CT pending.    CXR returned showing concern for pneumonia and CHF. BNP elevated from prior. Troponin elevated, at this time likely secondary to CHF.   I attempted to call son but was unable to contact him. History is limited at this time but labs, exam, imaging concerning for CHF exacerbation as primary etiology of symptoms. Was placed on O2 on arrival.  In atrial fibrillation.  Given abx for possible pneumonia given altered mental status, asymmetry on CXR, mild leukocytosis--however feel symptoms likely secondary to CHF.  Ordered lasix and will admit for further care.      Gareth Morgan, MD 12/11/19 2342

## 2019-12-11 NOTE — ED Notes (Signed)
Report gotten from [place] at 2320, delay medication given after report, pt resting on bed comfortable NAD noticed.

## 2019-12-11 NOTE — ED Notes (Addendum)
Abdomen distended, not eating or drinking as much per [name] Granddaughter CT head/CT abdomen  [facility]

## 2019-12-11 NOTE — H&P (Signed)
History and Physical    REYGAN HEAGLE EZM:[id number] DOB: Nov 19, 1918 DOA: 12/11/2019  PCP: Leanna Battles, MD  Patient coming from: [facility]  I have personally briefly reviewed patient's old medical records in [place]  Chief Complaint: Altered mental status, abdominal distention  HPI: MARRIAN [name] is a 86 y.o. female with medical history significant for dementia, atrial fibrillation on Pradaxa, hypertension, moderate mitral regurgitation, chronic diastolic heart failure, asthma, CKD stage IV, anemia who presents with concerns of altered mental status from her facility.  Patient unable to provide history given her dementia.  Per ED documentation, family called patient today and noted increased confusion.  Also reports patient has had decreased p.o. intake and abdominal distention.  No changes per nursing staff at the facility.  Patient had persistent cough during evaluation with sputum production.  She does complain of abdominal pain although did not do so during actual abdominal exam.  ED Course: She was afebrile, tachycardic normotensive.  Lab showed leukocytosis of 11.2. K of 5.9, creatinine stable at 1.79 which is around baseline. BNP of 930.5. Troponin of 189.  Negative UA. Negative COVID PCR.   CXR shows suspected multifocal pneumonia with bilateral pleural effusion.  CT abdomen and pelvis shows wall thickening of the proximal duodenum with several others incidental findings noted.   She received aspirin, 40mg  Lasix, 2L of IV fluids, Rocephin and Azithromycin.   Review of Systems:  Unable to obtain due to patient's dementia  Past Medical History:  Diagnosis Date   Anemia    Arthritis    Asthma    Cellulitis    Chronic acquired lymphedema    Chronic kidney disease    Chronic venous insufficiency    Dysrhythmia    GERD (gastroesophageal reflux disease)    Heart murmur    Hyperlipidemia    Hypertension     Past Surgical History:  Procedure  Laterality Date   JOINT REPLACEMENT     left total knee replacement     right total hip replacement     TONSILLECTOMY       reports that she quit smoking about 60 years ago. She has a 10.00 pack-year smoking history. She quit smokeless tobacco use about 60 years ago. She reports that she does not drink alcohol or use drugs.  Allergies  Allergen Reactions   Dust Mite Extract Shortness Of Breath and Itching   Mold Extract [Trichophyton Mentagrophyte] Shortness Of Breath, Itching and Cough    VERY ALLERGIC/EXPOSURE RESULTS IN TREMENDOUS COUGHING (just one symptom)   Latex Itching    And certain fabrics   Tape Other (See Comments)    SKIN IS VERY THIN AND WILL TEAR AND BRUISE EASILY!! PLEASE USE COBAN WRAP-    Family History  Problem Relation Age of Onset   Lung cancer Mother    Other Father        unknown causes   Breast cancer Neg Hx    Diabetes Neg Hx      Prior to Admission medications   Medication Sig Start Date End Date Taking? Authorizing Provider  dabigatran (PRADAXA) 75 MG CAPS capsule TAKE 1 CAPSULE EVERY 12 HOURS Patient taking differently: Take 75 mg by mouth 2 (two) times daily.  05/02/18  Yes Evans Lance, MD  isosorbide mononitrate (IMDUR) 30 MG 24 hr tablet Take 0.5 tablets (15 mg total) by mouth daily. 11/24/19  Yes Nita Sells, MD  metoprolol tartrate (LOPRESSOR) 25 MG tablet Take 1 tablet (25 mg total)  by mouth 2 (two) times daily. 11/27/16  Yes Evans Lance, MD  polyethylene glycol (MIRALAX / GLYCOLAX) 17 g packet Take 17 g by mouth daily as needed for mild constipation. 11/23/19  Yes Nita Sells, MD  QUEtiapine (SEROQUEL) 25 MG tablet Take 0.5 tablets (12.5 mg total) by mouth at bedtime. 11/23/19  Yes Nita Sells, MD  sulfamethoxazole-trimethoprim (BACTRIM DS) 800-160 MG tablet Take 1 tablet by mouth 2 (two) times daily.   Yes [provider]  aspirin EC 81 MG tablet Take 1 tablet (81 mg total) by mouth daily. 11/23/19  11/22/20  Nita Sells, MD  ipratropium (ATROVENT) 0.03 % nasal spray Place 1 spray into both nostrils at bedtime. 11/29/19   [provider]    Physical Exam: Vitals:   12/11/19 1800 12/11/19 1900 12/11/19 2005 12/11/19 2059  BP: (!) 163/88 (!) 141/93 (!) 146/86 (!) 146/86  Pulse: (!) 102 (!) 105  (!) 115  Resp: (!) 24 (!) 23 (!) 22 17  Temp:      TempSrc:      SpO2:    94%  Weight:      Height:        Constitutional: NAD, calm, comfortable elderly pleasantly demented female sitting up in bed Vitals:   12/11/19 1800 12/11/19 1900 12/11/19 2005 12/11/19 2059  BP: (!) 163/88 (!) 141/93 (!) 146/86 (!) 146/86  Pulse: (!) 102 (!) 105  (!) 115  Resp: (!) 24 (!) 23 (!) 22 17  Temp:      TempSrc:      SpO2:    94%  Weight:      Height:       Eyes: PERRL, lids and conjunctivae normal ENMT: Mucous membranes are moist.  Neck: normal, supple Respiratory: decrease aeration with faint crackles throughout. Normal respiratory effort on 2L. No accessory muscle use.  Cardiovascular: Regular rate and rhythm, no murmurs / rubs / gallops. No extremity edema. 2+ pedal pulses.  Abdomen: difficulty to assess tenderness due to dementia but abdomen is soft, non-distended, no guarding or rigidity, no masses palpated.  Bowel sounds positive.  Musculoskeletal: no clubbing / cyanosis. No joint deformity upper and lower extremities. Good ROM, no contractures. Normal muscle tone.  Skin: Thickening and discoloration of skin of bilateral lower extremity due to chronic venous stasis changes Neurologic: Pt alert but not oriented, does not answer questions directly but is able to follow simple commands. Sensation intact. Able to lift all extremities.  Psychiatric: Alert but not oriented.   Labs on Admission: I have personally reviewed following labs and imaging studies  CBC: Recent Labs  Lab 12/11/19 1423  WBC 11.2*  NEUTROABS 9.0*  HGB 11.2*  HCT 37.6  MCV 66.3*  PLT 267   Basic  Metabolic Panel: Recent Labs  Lab 12/11/19 1618 12/11/19 1749  NA 140  --   K 5.9* 5.8*  CL 109  --   CO2 19*  --   GLUCOSE 99  --   BUN 49*  --   CREATININE 1.79*  --   CALCIUM 9.1  --    GFR: Estimated Creatinine Clearance: 18.1 mL/min (A) (by C-G formula based on SCr of 1.79 mg/dL (H)). Liver Function Tests: Recent Labs  Lab 12/11/19 1749  AST 19  ALT 14  ALKPHOS 72  BILITOT 1.0  PROT 6.6  ALBUMIN 3.0*   Recent Labs  Lab 12/11/19 1749  LIPASE 34   No results for input(s): AMMONIA in the last 168 hours. Coagulation Profile: No  results for input(s): INR, PROTIME in the last 168 hours. Cardiac Enzymes: No results for input(s): CKTOTAL, CKMB, CKMBINDEX, TROPONINI in the last 168 hours. BNP (last 3 results) No results for input(s): PROBNP in the last 8760 hours. HbA1C: No results for input(s): HGBA1C in the last 72 hours. CBG: No results for input(s): GLUCAP in the last 168 hours. Lipid Profile: No results for input(s): CHOL, HDL, LDLCALC, TRIG, CHOLHDL, LDLDIRECT in the last 72 hours. Thyroid Function Tests: No results for input(s): TSH, T4TOTAL, FREET4, T3FREE, THYROIDAB in the last 72 hours. Anemia Panel: No results for input(s): VITAMINB12, FOLATE, FERRITIN, TIBC, IRON, RETICCTPCT in the last 72 hours. Urine analysis:    Component Value Date/Time   COLORURINE YELLOW 12/11/2019 1423   APPEARANCEUR CLEAR 12/11/2019 1423   LABSPEC 1.024 12/11/2019 1423   PHURINE 5.0 12/11/2019 1423   GLUCOSEU NEGATIVE 12/11/2019 1423   HGBUR NEGATIVE 12/11/2019 1423   BILIRUBINUR NEGATIVE 12/11/2019 1423   KETONESUR 5 (A) 12/11/2019 1423   PROTEINUR 30 (A) 12/11/2019 1423   UROBILINOGEN 0.2 07/10/2012 1122   NITRITE NEGATIVE 12/11/2019 1423   LEUKOCYTESUR NEGATIVE 12/11/2019 1423    Radiological Exams on Admission: CT ABDOMEN PELVIS WO CONTRAST  Result Date: 12/11/2019 CLINICAL DATA:  Abdominal distension. EXAM: CT ABDOMEN AND PELVIS WITHOUT CONTRAST TECHNIQUE:  Multidetector CT imaging of the abdomen and pelvis was performed following the standard protocol without IV contrast. COMPARISON:  None. FINDINGS: Lower chest: There are moderate-sized bilateral pleural effusions that are only partially visualized. There is atelectasis at the lung bases.The heart is enlarged. Thoracic aortic calcifications are noted. There are coronary artery calcifications. Hepatobiliary: The liver surface appears nodular raising concern for underlying cirrhosis. There is diffuse gallbladder wall thickening which is nonspecific.There is no biliary ductal dilation. Pancreas: Normal contours without ductal dilatation. No peripancreatic fluid collection. Spleen: Unremarkable. Adrenals/Urinary Tract: --Adrenal glands: Unremarkable. --Right kidney/ureter: The right kidney is atrophic. --Left kidney/ureter: The left kidney is atrophic. --Urinary bladder: Unremarkable. Stomach/Bowel: --Stomach/Duodenum: The stomach is unremarkable. There appears to be wall thickening of the proximal duodenum. --Small bowel: Unremarkable. --Colon: Unremarkable. --Appendix: Normal. Vascular/Lymphatic: There are atherosclerotic changes of the abdominal aorta. There is an infrarenal abdominal aortic aneurysm measuring approximately 3.9 cm. --No retroperitoneal lymphadenopathy. --No mesenteric lymphadenopathy. --No pelvic or inguinal lymphadenopathy. Reproductive: There is a cystic mass involving the right ovary measuring approximately 9.6 cm. Other: There is a small amount of free fluid in the patient's abdomen. The abdominal wall is normal. Musculoskeletal. No acute displaced fractures. IMPRESSION: 1. There appears to be wall thickening of the proximal duodenum, which may represent duodenitis. 2. There is a 9.6 cm cystic mass involving the right ovary. Follow-up with a nonemergent outpatient pelvic ultrasound is recommended. 3. There are moderate-sized bilateral pleural effusions that are only partially visualized. There is  atelectasis at the lung bases. 4. Infrarenal abdominal aortic aneurysm measuring 3.9 cm. Recommend followup by ultrasound in 2 years. This recommendation follows ACR consensus guidelines: White Paper of the ACR Incidental Findings Committee II on Vascular Findings. J Am Coll Radiol 2013; 10:789-794. Aortic aneurysm NOS (ICD10-I71.9) 5. The liver surface appears nodular raising concern for underlying cirrhosis. 6. Bilateral atrophic kidneys. 7. Trace ascites. 8. Gallbladder wall thickening which is nonspecific and may be secondary to the presence of ascites or heart disease. If there is clinical concern for acute cholecystitis, follow-up with ultrasound is recommended. Aortic Atherosclerosis (ICD10-I70.0). Electronically Signed   By: Constance Holster M.D.   On: 12/11/2019 19:58   DG Chest 1  View  Result Date: 12/11/2019 CLINICAL DATA:  Altered mental status/weakness EXAM: CHEST  1 VIEW COMPARISON:  Nov 19, 2019 FINDINGS: There are pleural effusions bilaterally. There is airspace opacity throughout much of the right mid and lower lung regions. There is subtle ill-defined opacity in the left mid lung. There is cardiomegaly. The pulmonary vascularity is normal. There is aortic atherosclerosis. No adenopathy appreciable. No bone lesions. IMPRESSION: Multifocal airspace opacity, considerably more on the right than on the left. Suspect multifocal pneumonia, although a degree of underlying pulmonary edema is possible. There is cardiomegaly with pleural effusions bilaterally. There may be a degree of congestive heart failure with associated pneumonia. Aortic Atherosclerosis (ICD10-I70.0). Electronically Signed   By: Lowella Grip III M.D.   On: 12/11/2019 18:03   CT Head Wo Contrast  Result Date: 12/11/2019 CLINICAL DATA:  Altered mental status. EXAM: CT HEAD WITHOUT CONTRAST TECHNIQUE: Contiguous axial images were obtained from the base of the skull through the vertex without intravenous contrast. COMPARISON:   None. FINDINGS: Brain: No evidence of acute infarction, hemorrhage, hydrocephalus, extra-axial collection or mass lesion/mass effect. Mild-to-moderate generalized cerebral atrophy. Vascular: Atherosclerotic vascular calcification of the carotid siphons. No hyperdense vessel. Skull: Normal. Negative for fracture or focal lesion. Sinuses/Orbits: No acute finding. Other: None. IMPRESSION: 1. No acute intracranial abnormality. Electronically Signed   By: Titus Dubin M.D.   On: 12/11/2019 19:52      Assessment/Plan  AMS like multifactorial from CHF exacerbation, pneumonia and ?duodenitis  Continue to monitor with management as below   Acute on chronic CHF exacerbation  Continue daily IV 40mg  Lasix Monitor Is and Os. Daily weights  Last echo 09/2019- EF of 03-88% and diastolic function could not be evaluated Repeat Echo  Acute hypoxic secondary to Pneumonia and possible asthma exacerbation No significant oxygen desaturation noted on documentation- 91% is the lowest Admitted on 2L- wean as tolerated Check PCT - continue Rocephin and Azithromycin for now  Scheduled q6hr duoneb Pt had some difficult clearing secretion- Keep NPO with speech evaluation. Has Flagyl for anaerobic coverage for duodenitis  Duodenitis  Add flagyl   Elevated troponin Likely demand ischemic for CHF exacerbation Trend troponin  Hyperkalemia  Give Lokelma   Cystic mass of right ovary 9.6cm incidental mass on CT  Recommends outpatient pelvic ultrasound   Intrarenal AAA 3.9 cm Ultrasound in 2 years recommended  ?cirrhosis noted on CT  Normal LFTS. Check Hep C.   CKD stage 4 Creatinine stable   Atrial fibrillation  Continue Pradaxa  Continue metoprolol   Dementia Continue seroquel   DVT prophylaxis: Pradaxa  Code Status: Full Family Communication: No family at bedside disposition Plan: SNF with at least 2 midnight stays  Consults called:  Admission status: inpatient  Status is:  Inpatient  Remains inpatient appropriate because:Inpatient level of care appropriate due to severity of illness   Dispo: The patient is from: SNF              Anticipated d/c is to: SNF              Anticipated d/c date is: 3 days              Patient currently is not medically stable to d/c.         Orene Desanctis DO [facility]   If 7PM-7AM, please contact night-coverage [email or web address]   12/11/2019, 9:47 PM

## 2019-12-11 NOTE — ED Notes (Signed)
Lori [name], son, [phone] would like an update when available

## 2019-12-11 NOTE — ED Notes (Signed)
[phone] [facility] (CAREGIVER Luella Cook FROM NURSING HOME) ALSO WAS THE PERSON WHO SENT THE PT TO THE HOSPITAL WOULD LIKE AN UPDATE AS SOON AS YOU GUYS CAN.

## 2019-12-12 ENCOUNTER — Inpatient Hospital Stay (HOSPITAL_COMMUNITY): Payer: Medicare Other

## 2019-12-12 DIAGNOSIS — I5033 Acute on chronic diastolic (congestive) heart failure: Secondary | ICD-10-CM

## 2019-12-12 DIAGNOSIS — I509 Heart failure, unspecified: Secondary | ICD-10-CM

## 2019-12-12 DIAGNOSIS — R410 Disorientation, unspecified: Secondary | ICD-10-CM

## 2019-12-12 DIAGNOSIS — J9601 Acute respiratory failure with hypoxia: Secondary | ICD-10-CM

## 2019-12-12 DIAGNOSIS — I714 Abdominal aortic aneurysm, without rupture: Secondary | ICD-10-CM

## 2019-12-12 DIAGNOSIS — I5031 Acute diastolic (congestive) heart failure: Secondary | ICD-10-CM

## 2019-12-12 LAB — CBC
HCT: 34.6 % — ABNORMAL LOW (ref 36.0–46.0)
Hemoglobin: 10.4 g/dL — ABNORMAL LOW (ref 12.0–15.0)
MCH: 20 pg — ABNORMAL LOW (ref 26.0–34.0)
MCHC: 30.1 g/dL (ref 30.0–36.0)
MCV: 66.5 fL — ABNORMAL LOW (ref 80.0–100.0)
Platelets: 257 10*3/uL (ref 150–400)
RBC: 5.2 MIL/uL — ABNORMAL HIGH (ref 3.87–5.11)
RDW: 17.1 % — ABNORMAL HIGH (ref 11.5–15.5)
WBC: 9.6 10*3/uL (ref 4.0–10.5)
nRBC: 0.2 % (ref 0.0–0.2)

## 2019-12-12 LAB — BASIC METABOLIC PANEL
Anion gap: 8 (ref 5–15)
BUN: 50 mg/dL — ABNORMAL HIGH (ref 8–23)
CO2: 22 mmol/L (ref 22–32)
Calcium: 8.6 mg/dL — ABNORMAL LOW (ref 8.9–10.3)
Chloride: 110 mmol/L (ref 98–111)
Creatinine, Ser: 1.8 mg/dL — ABNORMAL HIGH (ref 0.44–1.00)
GFR calc Af Amer: 27 mL/min — ABNORMAL LOW (ref >60)
GFR calc non Af Amer: 23 mL/min — ABNORMAL LOW (ref >60)
Glucose, Bld: 82 mg/dL (ref 70–99)
Potassium: 5 mmol/L (ref 3.5–5.1)
Sodium: 140 mmol/L (ref 135–145)

## 2019-12-12 LAB — PROCALCITONIN: Procalcitonin: 0.11 ng/mL

## 2019-12-12 LAB — ECHOCARDIOGRAM COMPLETE
Height: 64 in
Weight: 2879.98 oz

## 2019-12-12 LAB — HEPATITIS C ANTIBODY: HCV Ab: NONREACTIVE

## 2019-12-12 LAB — LACTIC ACID, PLASMA: Lactic Acid, Venous: 1.3 mmol/L (ref 0.5–1.9)

## 2019-12-12 LAB — URINE CULTURE

## 2019-12-12 LAB — TROPONIN I (HIGH SENSITIVITY): Troponin I (High Sensitivity): 179 ng/L (ref <18)

## 2019-12-12 MED ORDER — IPRATROPIUM-ALBUTEROL 0.5-2.5 (3) MG/3ML IN SOLN: 3.0000 mL | Freq: Four times a day (QID) | RESPIRATORY_TRACT | Status: DC | PRN

## 2019-12-12 MED ORDER — PANTOPRAZOLE SODIUM 40 MG PO TBEC
40.0000 mg | DELAYED_RELEASE_TABLET | Freq: Every day | ORAL | Status: DC
  Administered 2019-12-12 – 2019-12-16 (×5): 40 mg via ORAL
  Filled 2019-12-12 (×5): qty 1

## 2019-12-12 NOTE — Progress Notes (Signed)
Echocardiogram 2D Echocardiogram has been performed.  Lori [name] [name] [name] 12/12/2019, 1:21 PM

## 2019-12-12 NOTE — Progress Notes (Signed)
Spoke with Lori [name]. Paperwork for POA received and placed in patients chart. Pt spoke with her and was able to recognize her voice. Lori [name] states she thinks the patients mentation has improved.

## 2019-12-12 NOTE — Evaluation (Signed)
Clinical/Bedside Swallow Evaluation Patient Details  Name: Lori [name] MRN: [id number] Date of Birth: 20-May-1919  Today's Date: 12/12/2019 Time: SLP Start Time (ACUTE ONLY): 1553 SLP Stop Time (ACUTE ONLY): 1612 SLP Time Calculation (min) (ACUTE ONLY): 19 min  Past Medical History:  Past Medical History:  Diagnosis Date  . Anemia   . Arthritis   . Asthma   . Cellulitis   . Chronic acquired lymphedema   . Chronic kidney disease   . Chronic venous insufficiency   . Dysrhythmia   . GERD (gastroesophageal reflux disease)   . Heart murmur   . Hyperlipidemia   . Hypertension    Past Surgical History:  Past Surgical History:  Procedure Laterality Date  . JOINT REPLACEMENT    . left total knee replacement    . right total hip replacement    . TONSILLECTOMY     HPI:  Lori [name] is a 86 y.o. female with medical history significant for dementia, GERD, atrial fibrillation,  hypertension, moderate mitral regurgitation, chronic diastolic heart failure, asthma, CKD stage IV, anemia who presents with concerns of altered mental status and abdominal distention from her facility. CXR shows suspected multifocal pneumonia with bilateral pleural effusion.     Assessment / Plan / Recommendation Clinical Impression  Pt denied significant dysphagia and presents with strong cough and no outward oral-motor deficits. Straw presenttions of thin within normal limits from subjective viewpoint. Oral mastication adequate although she is missing upper and lower posterior. Pt has not had pneumonia on past CXR's. It is recommended she initiate Dys 3 texture, thin liquids, pills whole in puree and remain upright after meals due to GER. No further ST needed.  SLP Visit Diagnosis: Dysphagia, unspecified (R13.10)    Aspiration Risk  Mild aspiration risk    Diet Recommendation Dysphagia 3 (Mech soft);Thin liquid   Medication Administration: Whole meds with puree Supervision: Patient able to self  feed;Intermittent supervision to cue for compensatory strategies Compensations: Slow rate;Small sips/bites    Other  Recommendations Oral Care Recommendations: Oral care BID   Follow up Recommendations None      Frequency and Duration            Prognosis        Swallow Study   General HPI: Lori [name] is a 86 y.o. female with medical history significant for dementia, GERD, atrial fibrillation,  hypertension, moderate mitral regurgitation, chronic diastolic heart failure, asthma, CKD stage IV, anemia who presents with concerns of altered mental status and abdominal distention from her facility. CXR shows suspected multifocal pneumonia with bilateral pleural effusion.   Previous Swallow Assessment: (none) Respiratory Status: Nasal cannula History of Recent Intubation: No Behavior/Cognition: Alert;Pleasant mood;Cooperative;Requires cueing Oral Care Completed by SLP: No Oral Cavity - Dentition: Missing dentition Vision: Functional for self-feeding Baseline Vocal Quality: Normal Volitional Cough: Strong Volitional Swallow: Able to elicit    Oral/Motor/Sensory Function Overall Oral Motor/Sensory Function: Within functional limits   Ice Chips Ice chips: Not tested   Thin Liquid Thin Liquid: Within functional limits Presentation: Cup;Straw    Nectar Thick Nectar Thick Liquid: Not tested   Honey Thick Honey Thick Liquid: Not tested   Puree Puree: Within functional limits   Solid     Solid: Within functional limits      Houston Siren 12/12/2019,8:08 PM   Orbie Pyo Colvin Caroli.Ed Risk analyst [phone] Office [phone]

## 2019-12-12 NOTE — ED Notes (Signed)
Pt's daughter-in-law [name]) updated

## 2019-12-12 NOTE — Progress Notes (Signed)
Patient has a healthcare power of attorney. Her healthcare power of attorney and medical power of attorney is her granddaughter Danielle Rankin. She would like to be the first contact in regards to updates on the patient and any talks about the patient's care. Her phone number is [phone].

## 2019-12-12 NOTE — Progress Notes (Signed)
PROGRESS NOTE  Lori [name]:[id number] DOB: 12-22-1918 DOA: 12/11/2019 PCP: Leanna Battles, MD   LOS: 1 day   Brief Narrative / Interim history: 86 year old female with history of dementia, A. fib on Pradaxa, HTN, moderate mitral regurg, chronic diastolic CHF, asthma, chronic kidney disease stage IV with baseline Cr 1.9-2, chronic anemia, came into the hospital with concern for worsening confusion from prior facility.  She is unable to provide any history.  There is also reports of decreased p.o. intake and abdominal distention.  Chest x-ray on admission showed suspected multifocal pneumonia with bilateral pleural effusions, she had a WBC of 11.2, K of 5.9 and creatinine was 1.8.  CT scan of the abdomen and pelvis also showed proximal duodenal wall thickening.  Subjective / 24h Interval events: She is pleasantly demented this morning, she has no complaints for me.  Denies any abdominal pain, nausea or vomiting.  Assessment & Plan: Principal Problem Acute metabolic encephalopathy-she still is quite confused and appears off.  This is likely multifactorial in the setting of infectious process or pneumonia, acute on chronic diastolic CHF.  Treat with antibiotics, diuresis, monitor mental status closely  Active Problems Acute on chronic diastolic CHF-patient was placed on IV Lasix, continue, last echo was done in March 2021 showed an EF of 55-60%.  A repeat 2D echo is pending  Multifocal pneumonia-based on the chest x-ray, patient was placed on antibiotics with ceftriaxone, azithromycin, and metronidazole.  Continue while awaiting cultures.  Obtain speech to evaluate aspiration risk  Chronic kidney disease stage IV-Baseline creatinine 1.9-2, currently at baseline.  She was placed on diuretics, closely monitor creatinine and urine output  Duodenitis-unclear etiology, she does not report any symptoms that are GI related, monitor.  Paroxysmal atrial fibrillation-continue Pradaxa,  metoprolol  Infrarenal AAA-monitor as an outpatient  Right ovary cystic mass-outpatient follow-up  Underlying dementia-continue Seroquel  History of urinary retention-had Foley placed at the beginning of May, this was followed by hospital stay for Serratia UTI, discharged on 5/20.   Scheduled Meds: . aspirin EC  81 mg Oral Daily  . dabigatran  75 mg Oral BID  . furosemide  40 mg Intravenous Daily  . isosorbide mononitrate  15 mg Oral Daily  . metoprolol tartrate  25 mg Oral BID  . QUEtiapine  12.5 mg Oral QHS   Continuous Infusions: . azithromycin    . cefTRIAXone (ROCEPHIN)  IV    . metronidazole Stopped (12/12/19 0702)   PRN Meds:.ipratropium-albuterol  DVT prophylaxis: Pradaxa Code Status: DNR Family Communication: no family at bedside   Status is: Inpatient  Remains inpatient appropriate because:Persistent encephalopathy, hypoxia   Dispo: The patient is from: SNF              Anticipated d/c is to: SNF              Anticipated d/c date is: 2 days              Patient currently is not medically stable to d/c.  Consultants:  None   Procedures:  2D echo: pending  Microbiology  none  Antimicrobials: Ceftriaxone/azithromycin/metronidazole 5/7 >>   Objective: Vitals:   12/12/19 0645 12/12/19 0700 12/12/19 0745 12/12/19 0830  BP: 135/70 (!) 141/82 136/79 130/82  Pulse: 87 76 89 79  Resp: 20 (!) 22 (!) 23 (!) 23  Temp:      TempSrc:      SpO2: 97% 97% 98% 98%  Weight:      Height:  Intake/Output Summary (Last 24 hours) at 12/12/2019 1004 Last data filed at 12/12/2019 0702 Gross per 24 hour  Intake 1452.8 ml  Output --  Net 1452.8 ml   Filed Weights   12/11/19 1335  Weight: 81.6 kg    Examination:  Constitutional: NAD, pleasantly demented Eyes: no scleral icterus ENMT: Mucous membranes are moist.  Neck: normal, supple Respiratory: Diminished at the bases, no wheezing heard Cardiovascular: Regular rate and rhythm, no murmurs / rubs /  gallops. No LE edema.  Abdomen: non distended, no tenderness. Bowel sounds positive.  Musculoskeletal: no clubbing / cyanosis.  Skin: no rashes Neurologic: No focal deficits, follows commands inconsistently however   Data Reviewed: I have independently reviewed following labs and imaging studies   CBC: Recent Labs  Lab 12/11/19 1423  WBC 11.2*  NEUTROABS 9.0*  HGB 11.2*  HCT 37.6  MCV 66.3*  PLT 017   Basic Metabolic Panel: Recent Labs  Lab 12/11/19 1618 12/11/19 1749  NA 140  --   K 5.9* 5.8*  CL 109  --   CO2 19*  --   GLUCOSE 99  --   BUN 49*  --   CREATININE 1.79*  --   CALCIUM 9.1  --    Liver Function Tests: Recent Labs  Lab 12/11/19 1749  AST 19  ALT 14  ALKPHOS 72  BILITOT 1.0  PROT 6.6  ALBUMIN 3.0*   Coagulation Profile: No results for input(s): INR, PROTIME in the last 168 hours. HbA1C: No results for input(s): HGBA1C in the last 72 hours. CBG: No results for input(s): GLUCAP in the last 168 hours.  Recent Results (from the past 240 hour(s))  SARS Coronavirus 2 by RT PCR (hospital order, performed in [facility] hospital lab) Nasopharyngeal Nasopharyngeal Swab     Status: None   Collection Time: 12/11/19  5:45 PM   Specimen: Nasopharyngeal Swab  Result Value Ref Range Status   SARS Coronavirus 2 NEGATIVE NEGATIVE Final    Comment: (NOTE) SARS-CoV-2 target nucleic acids are NOT DETECTED. The SARS-CoV-2 RNA is generally detectable in upper and lower respiratory specimens during the acute phase of infection. The lowest concentration of SARS-CoV-2 viral copies this assay can detect is 250 copies / mL. A negative result does not preclude SARS-CoV-2 infection and should not be used as the sole basis for treatment or other patient management decisions.  A negative result may occur with improper specimen collection / handling, submission of specimen other than nasopharyngeal swab, presence of viral mutation(s) within the areas targeted by this  assay, and inadequate number of viral copies (<250 copies / mL). A negative result must be combined with clinical observations, patient history, and epidemiological information. Fact Sheet for Patients:   StrictlyIdeas.no Fact Sheet for Healthcare Providers: BankingDealers.co.za This test is not yet approved or cleared  by the [place] FDA and has been authorized for detection and/or diagnosis of SARS-CoV-2 by FDA under an Emergency Use Authorization (EUA).  This EUA will remain in effect (meaning this test can be used) for the duration of the COVID-19 declaration under Section 564(b)(1) of the Act, 21 U.S.C. section 360bbb-3(b)(1), unless the authorization is terminated or revoked sooner. Performed at [facility], [place] [address] [place], [place] [address]   Blood culture (routine x 2)     Status: None (Preliminary result)   Collection Time: 12/11/19  7:50 PM   Specimen: BLOOD  Result Value Ref Range Status   Specimen Description BLOOD LEFT ANTECUBITAL  Final  Special Requests   Final    BOTTLES DRAWN AEROBIC AND ANAEROBIC Blood Culture results may not be optimal due to an inadequate volume of blood received in culture bottles   Culture   Final    NO GROWTH < 12 HOURS Performed at [facility] [address] [place], [place] [address]    Report Status PENDING  Incomplete  Blood culture (routine x 2)     Status: None (Preliminary result)   Collection Time: 12/11/19  7:55 PM   Specimen: BLOOD RIGHT WRIST  Result Value Ref Range Status   Specimen Description BLOOD RIGHT WRIST  Final   Special Requests   Final    BOTTLES DRAWN AEROBIC AND ANAEROBIC Blood Culture results may not be optimal due to an inadequate volume of blood received in culture bottles   Culture   Final    NO GROWTH < 12 HOURS Performed at [facility], [place] [address] [place], [place] [address]    Report Status PENDING  Incomplete      Radiology Studies: CT ABDOMEN PELVIS WO CONTRAST  Result Date: 12/11/2019 CLINICAL DATA:  Abdominal distension. EXAM: CT ABDOMEN AND PELVIS WITHOUT CONTRAST TECHNIQUE: Multidetector CT imaging of the abdomen and pelvis was performed following the standard protocol without IV contrast. COMPARISON:  None. FINDINGS: Lower chest: There are moderate-sized bilateral pleural effusions that are only partially visualized. There is atelectasis at the lung bases.The heart is enlarged. Thoracic aortic calcifications are noted. There are coronary artery calcifications. Hepatobiliary: The liver surface appears nodular raising concern for underlying cirrhosis. There is diffuse gallbladder wall thickening which is nonspecific.There is no biliary ductal dilation. Pancreas: Normal contours without ductal dilatation. No peripancreatic fluid collection. Spleen: Unremarkable. Adrenals/Urinary Tract: --Adrenal glands: Unremarkable. --Right kidney/ureter: The right kidney is atrophic. --Left kidney/ureter: The left kidney is atrophic. --Urinary bladder: Unremarkable. Stomach/Bowel: --Stomach/Duodenum: The stomach is unremarkable. There appears to be wall thickening of the proximal duodenum. --Small bowel: Unremarkable. --Colon: Unremarkable. --Appendix: Normal. Vascular/Lymphatic: There are atherosclerotic changes of the abdominal aorta. There is an infrarenal abdominal aortic aneurysm measuring approximately 3.9 cm. --No retroperitoneal lymphadenopathy. --No mesenteric lymphadenopathy. --No pelvic or inguinal lymphadenopathy. Reproductive: There is a cystic mass involving the right ovary measuring approximately 9.6 cm. Other: There is a small amount of free fluid in the patient's abdomen. The abdominal wall is normal. Musculoskeletal. No acute displaced fractures. IMPRESSION: 1. There appears to be wall thickening of the proximal duodenum, which may represent duodenitis. 2. There is a 9.6 cm cystic mass involving the right ovary.  Follow-up with a nonemergent outpatient pelvic ultrasound is recommended. 3. There are moderate-sized bilateral pleural effusions that are only partially visualized. There is atelectasis at the lung bases. 4. Infrarenal abdominal aortic aneurysm measuring 3.9 cm. Recommend followup by ultrasound in 2 years. This recommendation follows ACR consensus guidelines: White Paper of the ACR Incidental Findings Committee II on Vascular Findings. J Am Coll Radiol 2013; 10:789-794. Aortic aneurysm NOS (ICD10-I71.9) 5. The liver surface appears nodular raising concern for underlying cirrhosis. 6. Bilateral atrophic kidneys. 7. Trace ascites. 8. Gallbladder wall thickening which is nonspecific and may be secondary to the presence of ascites or heart disease. If there is clinical concern for acute cholecystitis, follow-up with ultrasound is recommended. Aortic Atherosclerosis (ICD10-I70.0). Electronically Signed   By: Constance Holster M.D.   On: 12/11/2019 19:58   DG Chest 1 View  Result Date: 12/11/2019 CLINICAL DATA:  Altered mental status/weakness EXAM: CHEST  1 VIEW COMPARISON:  Nov 19, 2019 FINDINGS: There are pleural effusions bilaterally. There is airspace opacity throughout much of the right mid and lower lung regions. There is subtle ill-defined opacity in the left mid lung. There is cardiomegaly. The pulmonary vascularity is normal. There is aortic atherosclerosis. No adenopathy appreciable. No bone lesions. IMPRESSION: Multifocal airspace opacity, considerably more on the right than on the left. Suspect multifocal pneumonia, although a degree of underlying pulmonary edema is possible. There is cardiomegaly with pleural effusions bilaterally. There may be a degree of congestive heart failure with associated pneumonia. Aortic Atherosclerosis (ICD10-I70.0). Electronically Signed   By: Lowella Grip III M.D.   On: 12/11/2019 18:03   CT Head Wo Contrast  Result Date: 12/11/2019 CLINICAL DATA:  Altered mental  status. EXAM: CT HEAD WITHOUT CONTRAST TECHNIQUE: Contiguous axial images were obtained from the base of the skull through the vertex without intravenous contrast. COMPARISON:  None. FINDINGS: Brain: No evidence of acute infarction, hemorrhage, hydrocephalus, extra-axial collection or mass lesion/mass effect. Mild-to-moderate generalized cerebral atrophy. Vascular: Atherosclerotic vascular calcification of the carotid siphons. No hyperdense vessel. Skull: Normal. Negative for fracture or focal lesion. Sinuses/Orbits: No acute finding. Other: None. IMPRESSION: 1. No acute intracranial abnormality. Electronically Signed   By: Titus Dubin M.D.   On: 12/11/2019 19:52    Marzetta Board, MD, PhD [facility]  Between 7 am - 7 pm I am available, please contact me via Amion or Securechat  Between 7 pm - 7 am I am not available, please contact night coverage MD/APP via Amion

## 2019-12-13 LAB — BASIC METABOLIC PANEL
Anion gap: 10 (ref 5–15)
BUN: 50 mg/dL — ABNORMAL HIGH (ref 8–23)
CO2: 23 mmol/L (ref 22–32)
Calcium: 8.4 mg/dL — ABNORMAL LOW (ref 8.9–10.3)
Chloride: 109 mmol/L (ref 98–111)
Creatinine, Ser: 1.81 mg/dL — ABNORMAL HIGH (ref 0.44–1.00)
GFR calc Af Amer: 27 mL/min — ABNORMAL LOW (ref >60)
GFR calc non Af Amer: 23 mL/min — ABNORMAL LOW (ref >60)
Glucose, Bld: 87 mg/dL (ref 70–99)
Potassium: 4.2 mmol/L (ref 3.5–5.1)
Sodium: 142 mmol/L (ref 135–145)

## 2019-12-13 LAB — CBC
HCT: 32.1 % — ABNORMAL LOW (ref 36.0–46.0)
Hemoglobin: 9.5 g/dL — ABNORMAL LOW (ref 12.0–15.0)
MCH: 19.8 pg — ABNORMAL LOW (ref 26.0–34.0)
MCHC: 29.6 g/dL — ABNORMAL LOW (ref 30.0–36.0)
MCV: 66.9 fL — ABNORMAL LOW (ref 80.0–100.0)
Platelets: 244 10*3/uL (ref 150–400)
RBC: 4.8 MIL/uL (ref 3.87–5.11)
RDW: 17.1 % — ABNORMAL HIGH (ref 11.5–15.5)
WBC: 7.4 10*3/uL (ref 4.0–10.5)
nRBC: 0.4 % — ABNORMAL HIGH (ref 0.0–0.2)

## 2019-12-13 MED ORDER — SODIUM CHLORIDE 0.9 % IV SOLN
INTRAVENOUS | Status: DC | PRN
  Administered 2019-12-13 – 2019-12-14 (×2): 250 mL via INTRAVENOUS

## 2019-12-13 NOTE — Plan of Care (Signed)

## 2019-12-13 NOTE — Progress Notes (Addendum)
PROGRESS NOTE  Lori [name] ENI:[id number] DOB: Nov 13, 1918 DOA: 12/11/2019 PCP: Leanna Battles, MD   LOS: 2 days   Brief Narrative / Interim history: 86 year old female with history of dementia, A. fib on Pradaxa, HTN, moderate mitral regurg, chronic diastolic CHF, asthma, chronic kidney disease stage IV with baseline Cr 1.9-2, chronic anemia, came into the hospital with concern for worsening confusion from prior facility.  She is unable to provide any history.  There is also reports of decreased p.o. intake and abdominal distention.  Chest x-ray on admission showed suspected multifocal pneumonia with bilateral pleural effusions, she had a WBC of 11.2, K of 5.9 and creatinine was 1.8.  CT scan of the abdomen and pelvis also showed proximal duodenal wall thickening.  Subjective / 24h Interval events: She is pleasantly demented this morning, she has no complaints for me.  Denies any abdominal pain, nausea or vomiting.  Assessment & Plan: Principal Problem Acute metabolic encephalopathy-This is likely multifactorial in the setting of infectious process or pneumonia, acute on chronic diastolic CHF.  Treat with antibiotics, monitor mental status closely.  Discussed with daughter yesterday as well as this morning, patient has no underlying dementia and she is not sure about this has got to her chart  Active Problems Acute on chronic diastolic CHF-patient was placed on IV Lasix with improvement in her respiratory status.  Her kidney function has remained stable.  She appears euvolemic this morning, hold further IV diuresis. Last echo was done in March 2021 showed an EF of 55-60%.  A repeat 2D echo is pending  Multifocal pneumonia-based on the chest x-ray, patient was placed on antibiotics with ceftriaxone, azithromycin, and metronidazole.  Continue while awaiting cultures.  Speech evaluated patient recommended dysphagia 3 diet.  Chronic kidney disease stage IV-Baseline creatinine 1.9-2, currently  at baseline.  Her creatinine was stable with diuretics  Paroxysmal atrial fibrillation - Patient previously on metoprolol / Pradaxa discontinued by home hospice, but resumed that family wishes during her prior hospital stay, currently on Pradaxa and metoprolol.  Continue.  She is currently in A. fib, rate controlled on telemetry.  Duodenitis-unclear etiology, she does not report any symptoms that are GI related, monitor.  Paroxysmal atrial fibrillation-continue Pradaxa, metoprolol  Infrarenal AAA-monitor as an outpatient  Right ovary cystic mass-outpatient follow-up  History of urinary retention-had Foley placed at the beginning of May, this was followed by hospital stay for [place] UTI, discharged on 5/20.  Abdominal pain/distention-this was one of the main triggers that prompted hospitalization, but apparently she has received laxatives at the nursing home followed by several bowel movements and improvement in her symptoms.  This was related by granddaughter.  Scheduled Meds: . aspirin EC  81 mg Oral Daily  . dabigatran  75 mg Oral BID  . isosorbide mononitrate  15 mg Oral Daily  . metoprolol tartrate  25 mg Oral BID  . pantoprazole  40 mg Oral Daily  . QUEtiapine  12.5 mg Oral QHS   Continuous Infusions: . azithromycin 500 mg (12/12/19 1824)  . cefTRIAXone (ROCEPHIN)  IV 1 g (12/12/19 1724)  . metronidazole 500 mg (12/13/19 0615)   PRN Meds:.ipratropium-albuterol  DVT prophylaxis: Pradaxa Code Status: DNR Family Communication: no family at bedside, discussed with granddaughter over the phone  Status is: Inpatient  Remains inpatient appropriate because: Maintain her IV antibiotics while monitoring cultures, mental status improving and will return to SNF when family appreciates that she is at baseline  Dispo: The patient is from: SNF  Anticipated d/c is to: SNF              Anticipated d/c date is: 1 day              Patient currently is not medically stable to  d/c.  Consultants:  None   Procedures:  2D echo:  IMPRESSIONS  1. Left ventricular ejection fraction, by estimation, is 60 to 65%. The  left ventricle has normal function. The left ventricle has no regional  wall motion abnormalities. There is moderate left ventricular hypertrophy.  Left ventricular diastolic parameters are indeterminate. There is the interventricular septum is flattened in systole and diastole, consistent with right ventricular pressure and volume overload.  2. Right ventricular systolic function is mildly reduced. The right ventricular size is mildly enlarged. Moderately increased right ventricular wall thickness. There is moderately elevated pulmonary artery systolic pressure. The estimated right ventricular systolic pressure is 59.5 mmHg.  3. Left atrial size was moderately dilated.  4. Right atrial size was moderately dilated.  5. Moderate pleural effusion in the left lateral region.  6. The mitral valve is degenerative. Trivial mitral valve regurgitation. No evidence of mitral stenosis.  7. Tricuspid valve regurgitation is moderate.  8. The aortic valve is tricuspid. Aortic valve regurgitation is trivial. Mild aortic valve sclerosis is present, with no evidence of aortic valve stenosis.  9. The inferior vena cava is dilated in size with <50% respiratory variability, suggesting right atrial pressure of 15 mmHg.   Microbiology  none  Antimicrobials: Ceftriaxone/azithromycin/metronidazole 6/7 >>   Objective: Vitals:   12/13/19 0512 12/13/19 0824 12/13/19 0829 12/13/19 1155  BP: 116/70 133/76 133/76 99/65  Pulse: 89 96 91 80  Resp: 18  18 18   Temp: 97.6 F (36.4 C)  (!) 97.2 F (36.2 C) (!) 97.3 F (36.3 C)  TempSrc:   Oral Oral  SpO2: 93%  98% 98%  Weight:      Height:        Intake/Output Summary (Last 24 hours) at 12/13/2019 1245 Last data filed at 12/13/2019 0830 Gross per 24 hour  Intake 513.33 ml  Output 1700 ml  Net -1186.67 ml   Filed  Weights   12/11/19 1335 12/12/19 1542 12/13/19 0300  Weight: 81.6 kg 85.7 kg 81.7 kg    Examination:  Constitutional: NAD, very HOH Eyes: no icterus   ENMT: mmm Neck: normal, supple Respiratory: no wheezing, no crackles, diminished at the bases Cardiovascular: RRR, no edema Abdomen: soft, nt, nd, BS+ Musculoskeletal: no clubbing / cyanosis.  Skin: no rashes Neurologic: non focal    Data Reviewed: I have independently reviewed following labs and imaging studies   CBC: Recent Labs  Lab 12/11/19 1423 12/12/19 1602 12/13/19 0443  WBC 11.2* 9.6 7.4  NEUTROABS 9.0*  --   --   HGB 11.2* 10.4* 9.5*  HCT 37.6 34.6* 32.1*  MCV 66.3* 66.5* 66.9*  PLT 253 257 638   Basic Metabolic Panel: Recent Labs  Lab 12/11/19 1618 12/11/19 1749 12/12/19 0913 12/13/19 0443  NA 140  --  140 142  K 5.9* 5.8* 5.0 4.2  CL 109  --  110 109  CO2 19*  --  22 23  GLUCOSE 99  --  82 87  BUN 49*  --  50* 50*  CREATININE 1.79*  --  1.80* 1.81*  CALCIUM 9.1  --  8.6* 8.4*   Liver Function Tests: Recent Labs  Lab 12/11/19 1749  AST 19  ALT 14  ALKPHOS 72  BILITOT 1.0  PROT 6.6  ALBUMIN 3.0*   Coagulation Profile: No results for input(s): INR, PROTIME in the last 168 hours. HbA1C: No results for input(s): HGBA1C in the last 72 hours. CBG: No results for input(s): GLUCAP in the last 168 hours.  Recent Results (from the past 240 hour(s))  SARS Coronavirus 2 by RT PCR (hospital order, performed in [facility] hospital lab) Nasopharyngeal Nasopharyngeal Swab     Status: None   Collection Time: 12/11/19  5:45 PM   Specimen: Nasopharyngeal Swab  Result Value Ref Range Status   SARS Coronavirus 2 NEGATIVE NEGATIVE Final    Comment: (NOTE) SARS-CoV-2 target nucleic acids are NOT DETECTED. The SARS-CoV-2 RNA is generally detectable in upper and lower respiratory specimens during the acute phase of infection. The lowest concentration of SARS-CoV-2 viral copies this assay can detect is  250 copies / mL. A negative result does not preclude SARS-CoV-2 infection and should not be used as the sole basis for treatment or other patient management decisions.  A negative result may occur with improper specimen collection / handling, submission of specimen other than nasopharyngeal swab, presence of viral mutation(s) within the areas targeted by this assay, and inadequate number of viral copies (<250 copies / mL). A negative result must be combined with clinical observations, patient history, and epidemiological information. Fact Sheet for Patients:   StrictlyIdeas.no Fact Sheet for Healthcare Providers: BankingDealers.co.za This test is not yet approved or cleared  by the [place] FDA and has been authorized for detection and/or diagnosis of SARS-CoV-2 by FDA under an Emergency Use Authorization (EUA).  This EUA will remain in effect (meaning this test can be used) for the duration of the COVID-19 declaration under Section 564(b)(1) of the Act, 21 U.S.C. section 360bbb-3(b)(1), unless the authorization is terminated or revoked sooner. Performed at [facility], [place] [address] [place], [place] [address]   Urine culture     Status: Abnormal   Collection Time: 12/11/19  6:31 PM   Specimen: Urine, Clean Catch  Result Value Ref Range Status   Specimen Description URINE, CLEAN CATCH  Final   Special Requests   Final    NONE Performed at [facility], [place] [address], [place], [place] [address]    Culture MULTIPLE SPECIES PRESENT, SUGGEST RECOLLECTION (A)  Final   Report Status 12/12/2019 FINAL  Final  Blood culture (routine x 2)     Status: None (Preliminary result)   Collection Time: 12/11/19  7:50 PM   Specimen: BLOOD  Result Value Ref Range Status   Specimen Description BLOOD LEFT ANTECUBITAL  Final   Special Requests   Final    BOTTLES DRAWN AEROBIC AND ANAEROBIC Blood Culture results may not be optimal due  to an inadequate volume of blood received in culture bottles   Culture   Final    NO GROWTH 2 DAYS Performed at [facility], [place] [address], [place], [place] [address]    Report Status PENDING  Incomplete  Blood culture (routine x 2)     Status: None (Preliminary result)   Collection Time: 12/11/19  7:55 PM   Specimen: BLOOD RIGHT WRIST  Result Value Ref Range Status   Specimen Description BLOOD RIGHT WRIST  Final   Special Requests   Final    BOTTLES DRAWN AEROBIC AND ANAEROBIC Blood Culture results may not be optimal due to an inadequate volume of blood received in culture bottles   Culture   Final  NO GROWTH 2 DAYS Performed at [facility], [place] [address], [place], [place] [address]    Report Status PENDING  Incomplete     Radiology Studies: ECHOCARDIOGRAM COMPLETE  Result Date: 12/12/2019    ECHOCARDIOGRAM REPORT   Patient Name:   Lori [name] Date of Exam: 12/12/2019 Medical Rec #:  [id number]       Height:       64.0 in Accession #:    [id number]      Weight:       180.0 lb Date of Birth:  09/15/1918      BSA:          1.871 m Patient Age:    55 years        BP:           127/71 mmHg Patient Gender: F               HR:           80 bpm. Exam Location:  Inpatient Procedure: 2D Echo, Color Doppler and Cardiac Doppler Indications:    Z16.96 Acute diastolic (congestive) heart failure  History:        Patient has prior history of Echocardiogram examinations, most                 recent 09/21/2019. CHF, Pulmonary HTN, Arrythmias:Atrial                 Fibrillation; Risk Factors:Hypertension and Dyslipidemia.  Sonographer:    Raquel Sarna Senior RDCS Referring Phys: [id number] [place]  1. Left ventricular ejection fraction, by estimation, is 60 to 65%. The left ventricle has normal function. The left ventricle has no regional wall motion abnormalities. There is moderate left ventricular hypertrophy. Left ventricular diastolic parameters are indeterminate. There is the  interventricular septum is flattened in systole and diastole, consistent with right ventricular pressure and volume overload.  2. Right ventricular systolic function is mildly reduced. The right ventricular size is mildly enlarged. Moderately increased right ventricular wall thickness. There is moderately elevated pulmonary artery systolic pressure. The estimated right ventricular systolic pressure is 17.5 mmHg.  3. Left atrial size was moderately dilated.  4. Right atrial size was moderately dilated.  5. Moderate pleural effusion in the left lateral region.  6. The mitral valve is degenerative. Trivial mitral valve regurgitation. No evidence of mitral stenosis.  7. Tricuspid valve regurgitation is moderate.  8. The aortic valve is tricuspid. Aortic valve regurgitation is trivial. Mild aortic valve sclerosis is present, with no evidence of aortic valve stenosis.  9. The inferior vena cava is dilated in size with <50% respiratory variability, suggesting right atrial pressure of 15 mmHg. FINDINGS  Left Ventricle: Left ventricular ejection fraction, by estimation, is 60 to 65%. The left ventricle has normal function. The left ventricle has no regional wall motion abnormalities. The left ventricular internal cavity size was normal in size. There is  moderate left ventricular hypertrophy. The interventricular septum is flattened in systole and diastole, consistent with right ventricular pressure and volume overload. Left ventricular diastolic parameters are indeterminate. Right Ventricle: The right ventricular size is mildly enlarged. Moderately increased right ventricular wall thickness. Right ventricular systolic function is mildly reduced. There is moderately elevated pulmonary artery systolic pressure. The tricuspid regurgitant velocity is 2.74 m/s, and with an assumed right atrial pressure of 15 mmHg, the estimated right ventricular systolic pressure is 10.2 mmHg. Left Atrium: Left atrial size was moderately  dilated. Right Atrium:  Right atrial size was moderately dilated. Pericardium: A small pericardial effusion is present. Presence of pericardial fat pad. Mitral Valve: The mitral valve is degenerative in appearance. Moderate mitral annular calcification. Trivial mitral valve regurgitation. No evidence of mitral valve stenosis. MV peak gradient, 8.1 mmHg. The mean mitral valve gradient is 2.0 mmHg. Tricuspid Valve: The tricuspid valve is normal in structure. Tricuspid valve regurgitation is moderate. Aortic Valve: The aortic valve is tricuspid. Aortic valve regurgitation is trivial. Mild aortic valve sclerosis is present, with no evidence of aortic valve stenosis. Pulmonic Valve: The pulmonic valve was normal in structure. Pulmonic valve regurgitation is trivial. Aorta: The aortic root and ascending aorta are structurally normal, with no evidence of dilitation. Venous: The inferior vena cava is dilated in size with less than 50% respiratory variability, suggesting right atrial pressure of 15 mmHg. IAS/Shunts: No atrial level shunt detected by color flow Doppler. Additional Comments: There is a moderate pleural effusion in the left lateral region.  LEFT VENTRICLE PLAX 2D LVIDd:         2.90 cm  Diastology LVIDs:         1.90 cm  LV e' lateral:   5.66 cm/s LV PW:         1.40 cm  LV E/e' lateral: 22.8 LV IVS:        1.20 cm  LV e' medial:    4.46 cm/s LVOT diam:     2.00 cm  LV E/e' medial:  28.9 LV SV:         67 LV SV Index:   36 LVOT Area:     3.14 cm  RIGHT VENTRICLE RV S prime:     5.33 cm/s TAPSE (M-mode): 1.3 cm LEFT ATRIUM             Index       RIGHT ATRIUM           Index LA diam:        4.50 cm 2.41 cm/m  RA Area:     27.50 cm LA Vol (A2C):   83.4 ml 44.58 ml/m RA Volume:   82.50 ml  44.10 ml/m LA Vol (A4C):   73.2 ml 39.13 ml/m LA Biplane Vol: 77.8 ml 41.59 ml/m  AORTIC VALVE LVOT Vmax:   89.70 cm/s LVOT Vmean:  66.900 cm/s LVOT VTI:    0.214 m  AORTA Ao Root diam: 3.00 cm Ao Asc diam:  3.10 cm MITRAL  VALVE                TRICUSPID VALVE MV Area (PHT): 3.48 cm     TR Peak grad:   30.0 mmHg MV Peak grad:  8.1 mmHg     TR Vmax:        274.00 cm/s MV Mean grad:  2.0 mmHg MV Vmax:       1.42 m/s     SHUNTS MV Vmean:      69.9 cm/s    Systemic VTI:  0.21 m MV Decel Time: 218 msec     Systemic Diam: 2.00 cm MV E velocity: 129.00 cm/s MV A velocity: 31.80 cm/s MV E/A ratio:  4.06 Oswaldo Milian MD Electronically signed by Oswaldo Milian MD Signature Date/Time: 12/12/2019/3:07:50 PM    Final     Marzetta Board, MD, PhD [facility]  Between 7 am - 7 pm I am available, please contact me via Amion or Securechat  Between 7 pm - 7 am I am not available, please contact  night coverage MD/APP via Amion

## 2019-12-13 NOTE — Social Work (Signed)
CSW was requested to contact patient's granddaughter and HCPOA Danielle Rankin. CSW contacted granddaughter who wished to discuss discharge options. Granddaughter expressed Medicaid is currently pending for patient and long-term placement is needed. Granddaughter would like to explore other SNF options.  CSW updated MD on wishes and patient is being consulted for PT/OT. Granddaughter provided CSW permission to fax referrals to [place] and surrounding counties. CSW will send referrals pending PT recommendation.  Criss Alvine, [place] Social Worker

## 2019-12-13 NOTE — Progress Notes (Signed)
Pt hearing aid batteries dies. [name] notified and will try to send someone to drop some off today.

## 2019-12-13 NOTE — Progress Notes (Signed)
Pt had and significant weight difference from yesterday.  Standing weight obtained this morning vs bed weight on admission.

## 2019-12-14 MED ORDER — AMOXICILLIN-POT CLAVULANATE 500-125 MG PO TABS
1.0000 | ORAL_TABLET | Freq: Two times a day (BID) | ORAL | Status: AC
  Administered 2019-12-14 – 2019-12-16 (×4): 500 mg via ORAL
  Filled 2019-12-14 (×4): qty 1

## 2019-12-14 MED ORDER — AZITHROMYCIN 500 MG PO TABS
500.0000 mg | ORAL_TABLET | Freq: Every day | ORAL | Status: AC
  Administered 2019-12-14 – 2019-12-15 (×2): 500 mg via ORAL
  Filled 2019-12-14 (×2): qty 1

## 2019-12-14 MED ORDER — LOPERAMIDE HCL 2 MG PO CAPS
2.0000 mg | ORAL_CAPSULE | ORAL | Status: DC | PRN
  Administered 2019-12-14: 2 mg via ORAL
  Filled 2019-12-14: qty 1

## 2019-12-14 NOTE — NC FL2 (Signed)
[place] LEVEL OF CARE SCREENING TOOL     IDENTIFICATION  Patient Name: Lori [name] Birthdate: 31-Mar-1919 Sex: female Admission Date (Current Location): 12/11/2019  [facility] and [place] Number:  Herbalist and Address:  [facility] [place], [place] [address] [place], [place] [address]      Provider Number: [id number]  Attending Physician Name and Address:  Caren Griffins, MD  Relative Name and Phone Number:  [name]    Current Level of Care: Hospital Recommended Level of Care: [place] Prior Approval Number:    Date Approved/Denied:   PASRR Number:    Discharge Plan: SNF    Current Diagnoses: Patient Active Problem List   Diagnosis Date Noted  . AMS (altered mental status) 12/11/2019  . Duodenitis 12/11/2019  . Acute on chronic diastolic CHF (congestive heart failure) ([place]) 12/11/2019  . Acute respiratory failure with hypoxia ([place]) 12/11/2019  . Elevated troponin 12/11/2019  . Abdominal aortic aneurysm (AAA) 3.0 cm to 5.0 cm in diameter in female ([place]) 12/11/2019  . Dementia ([place]) 12/11/2019  . Acute lower UTI 11/19/2019  . Acute metabolic encephalopathy 89/38/1017  . Encephalopathy acute 11/19/2019  . Cellulitis 02/26/2018  . Chronic diastolic CHF (congestive heart failure) ([place]) 02/26/2018  . Chronic venous insufficiency 08/17/2017  . Hyperkalemia 08/17/2017  . CKD (chronic kidney disease), stage IV ([place]) 08/17/2017  . Moderate mitral regurgitation 11/04/2016  . Elevated brain natriuretic peptide (BNP) level 11/04/2016  . Pulmonary HTN ([place]) 11/04/2016  . Hyponatremia 07/13/2012  . Essential hypertension 03/31/2012  . Atrial fibrillation ([place]) 07/29/2011  . Peripheral edema 07/29/2011  . Cough 06/26/2010  . ANEMIA, IRON DEFICIENCY 06/10/2010  . ANEMIA-UNSPECIFIED 06/10/2010  . Asthma 06/10/2010  . PERSONAL HX COLONIC POLYPS 06/10/2010    Orientation RESPIRATION BLADDER Height & Weight      Time, Place, Self  O2 (Nasal Cannula) Incontinent, External catheter Weight: 182 lb 4.8 oz (82.7 kg) Height:  5\' 5"  (165.1 cm)  BEHAVIORAL SYMPTOMS/MOOD NEUROLOGICAL BOWEL NUTRITION STATUS      Continent Diet (See discharge summary)  AMBULATORY STATUS COMMUNICATION OF NEEDS Skin     Verbally Normal                       Personal Care Assistance Level of Assistance  Bathing, Feeding, Dressing           Functional Limitations Info  Sight, Hearing, Speech Sight Info: Adequate Hearing Info: Impaired Speech Info: Adequate    SPECIAL CARE FACTORS FREQUENCY  PT (By licensed PT), OT (By licensed OT)     PT Frequency: 5x a week OT Frequency: 5x a week            Contractures Contractures Info: Not present    Additional Factors Info  Code Status, Allergies Code Status Info: DNR Allergies Info: Dust Mite Extract, Mold Extract (Trichophyton Mentagrophyte), Latex, Tape           Current Medications (12/14/2019):  This is the current hospital active medication list Current Facility-Administered Medications  Medication Dose Route Frequency Provider Last Rate Last Admin  . 0.9 %  sodium chloride infusion   Intravenous PRN Caren Griffins, MD 10 mL/hr at 12/14/19 0543 250 mL at 12/14/19 0543  . amoxicillin-clavulanate (AUGMENTIN) 500-125 MG per tablet 500 mg  1 tablet Oral BID WC Caren Griffins, MD      . aspirin EC tablet 81 mg  81 mg Oral Daily Tu, Ching  T, DO   81 mg at 12/14/19 0829  . azithromycin (ZITHROMAX) tablet 500 mg  500 mg Oral Daily Caren Griffins, MD      . dabigatran (PRADAXA) capsule 75 mg  75 mg Oral BID Tu, Ching T, DO   75 mg at 12/14/19 1144  . ipratropium-albuterol (DUONEB) 0.5-2.5 (3) MG/3ML nebulizer solution 3 mL  3 mL Nebulization Q6H PRN Tu, Ching T, DO      . isosorbide mononitrate (IMDUR) 24 hr tablet 15 mg  15 mg Oral Daily Tu, Ching T, DO   15 mg at 12/13/19 0825  . loperamide (IMODIUM) capsule 2 mg  2 mg Oral PRN Caren Griffins, MD    2 mg at 12/14/19 1448  . metoprolol tartrate (LOPRESSOR) tablet 25 mg  25 mg Oral BID Tu, Ching T, DO   25 mg at 12/13/19 2131  . pantoprazole (PROTONIX) EC tablet 40 mg  40 mg Oral Daily Caren Griffins, MD   40 mg at 12/14/19 0829  . QUEtiapine (SEROQUEL) tablet 12.5 mg  12.5 mg Oral QHS Tu, Ching T, DO   12.5 mg at 12/13/19 2130     Discharge Medications: Please see discharge summary for a list of discharge medications.  Relevant Imaging Results:  Relevant Lab Results:   Additional Information SSN [id number]  [name] [place], [place]

## 2019-12-14 NOTE — Evaluation (Signed)
Physical Therapy Evaluation Patient Details Name: Lori [name] MRN: [id number] DOB: 13-Aug-1918 Today's Date: 12/14/2019   History of Present Illness  86 year old female with history of dementia, A. fib on Pradaxa, HTN, moderate mitral regurg, chronic diastolic CHF, asthma, chronic kidney disease stage IV with baseline Cr 1.9-2, chronic anemia, came into the hospital with concern for worsening confusion from [facility]. Admitted 12/11/19 for treatment of acute encephalopathy, in presence of acute on chronic CHF, acute hypoxic respiratory failure, and multifocal PNA.   Clinical Impression  PTA pt resident at [facility]. Pt with poor memory of actual level of mobility at facility. Pt is currently limited in safe mobility by decreased cognition, including decreased memory, difficulty with sequencing and decreased knowledge of deficits, in presence of generalized weakness, and decreased balance. PT recommended SNF level rehab to progress mobility as pt is motivated to improve her mobility and work with therapy. PT will continue to follow acutely.     Follow Up Recommendations SNF    Equipment Recommendations  None recommended by PT       Precautions / Restrictions Precautions Precautions: Fall Restrictions Weight Bearing Restrictions: No      Mobility  Bed Mobility Overal bed mobility: Needs Assistance Bed Mobility: Supine to Sit     Supine to sit: Min assist     General bed mobility comments: min A for pt to pull up against therapist to bring trunk to upright, pt able to manage LE off bed and scoot hips to EoB, mild dizziness which quickly dissipated  Transfers Overall transfer level: Needs assistance Equipment used: Rolling walker (2 wheeled) Transfers: Sit to/from Stand Sit to Stand: Min assist         General transfer comment: min A for power up from low bed to RW, and steady  Ambulation/Gait Ambulation/Gait assistance: Min assist Gait Distance  (Feet): 3 Feet Assistive device: Rolling walker (2 wheeled) Gait Pattern/deviations: Step-to pattern;Decreased step length - right;Decreased step length - left;Trunk flexed;Shuffle Gait velocity: slowed Gait velocity interpretation: <1.31 ft/sec, indicative of household ambulator General Gait Details: min A for steadying and RW navigation to step to recliner, mild knee buckling noted and decreased eccentric control to sit in Physicist, medical    Modified Rankin (Stroke Patients Only)       Balance Overall balance assessment: Needs assistance   Sitting balance-Leahy Scale: Fair     Standing balance support: Bilateral upper extremity supported;During functional activity Standing balance-Leahy Scale: Poor                               Pertinent Vitals/Pain Pain Assessment: Faces Faces Pain Scale: Hurts a little bit Pain Location: generalized  Pain Descriptors / Indicators: Grimacing;Guarding Pain Intervention(s): Limited activity within patient's tolerance;Monitored during session;Repositioned    Home Living Family/patient expects to be discharged to:: Skilled nursing facility                 Additional Comments: from [employer], Family would like to explore other SNF options for D/c    Prior Function Level of Independence: Needs assistance   Gait / Transfers Assistance Needed: ambulates with Rollator and assist     Comments: pt with some confusion of most recent level of function         Extremity/Trunk Assessment   Upper Extremity Assessment Upper Extremity Assessment: Generalized weakness  Lower Extremity Assessment Lower Extremity Assessment: Generalized weakness       Communication   Communication: HOH  Cognition Arousal/Alertness: Awake/alert Behavior During Therapy: WFL for tasks assessed/performed;Restless Overall Cognitive Status: No family/caregiver present to determine baseline  cognitive functioning Area of Impairment: Memory;Safety/judgement;Problem solving                     Memory: Decreased short-term memory   Safety/Judgement: Decreased awareness of safety;Decreased awareness of deficits   Problem Solving: Difficulty sequencing;Requires verbal cues;Requires tactile cues General Comments: AxOx4 however decreased memory of where she came from and what her PLOF was      General Comments General comments (skin integrity, edema, etc.): Pt on 2 L O2 via [place] on entry with SaO2 98%O2, removed [place] and pt able to maintain 96%O2 on RA, with mobility SaO2 dropped to 87%O2, pt placed back on 2L O2 via  and rebounded to 90%O2        Assessment/Plan    PT Assessment Patient needs continued PT services  PT Problem List Decreased strength;Decreased activity tolerance;Decreased balance;Decreased mobility;Decreased coordination;Decreased cognition;Decreased knowledge of use of DME;Decreased safety awareness;Decreased knowledge of precautions       PT Treatment Interventions DME instruction;Gait training;Functional mobility training;Therapeutic activities;Therapeutic exercise;Balance training;Cognitive remediation;Patient/family education    PT Goals (Current goals can be found in the Care Plan section)  Acute Rehab PT Goals Patient Stated Goal: make it to her greatgrandson's graduation next year PT Goal Formulation: With patient Time For Goal Achievement: 12/28/19 Potential to Achieve Goals: Fair    Frequency Min 2X/week    AM-PAC PT "6 Clicks" Mobility  Outcome Measure Help needed turning from your back to your side while in a flat bed without using bedrails?: A Little Help needed moving from lying on your back to sitting on the side of a flat bed without using bedrails?: A Lot Help needed moving to and from a bed to a chair (including a wheelchair)?: A Little Help needed standing up from a chair using your arms (e.g., wheelchair or bedside chair)?: A  Little Help needed to walk in hospital room?: A Lot Help needed climbing 3-5 steps with a railing? : Total 6 Click Score: 14    End of Session Equipment Utilized During Treatment: Gait belt;Other (comment) Activity Tolerance: Patient tolerated treatment well Patient left: in chair;with call bell/phone within reach;with chair alarm set Nurse Communication: Mobility status PT Visit Diagnosis: Unsteadiness on feet (R26.81);Other abnormalities of gait and mobility (R26.89);Muscle weakness (generalized) (M62.81);Difficulty in walking, not elsewhere classified (R26.2)    Time: 3419-6222 PT Time Calculation (min) (ACUTE ONLY): 25 min   Charges:   PT Evaluation $PT Eval Moderate Complexity: 1 Mod PT Treatments $Gait Training: 8-22 mins        Nadir Vasques B. Migdalia Dk PT, DPT Acute Rehabilitation Services Pager [phone] Office [phone]   [place] 12/14/2019, 3:22 PM

## 2019-12-14 NOTE — Progress Notes (Signed)
SATURATION QUALIFICATIONS: (This note is used to comply with regulatory documentation for home oxygen)  Patient Saturations on Room Air at Rest = 92%  Patient Saturations on Room Air while Mobilizing = 87%  Patient Saturations on 2 Liters of oxygen while mobilizing = 96%  Please briefly explain why patient needs home oxygen:  Pt requires 2L supplemental oxygen via [place] to maintain SaO2 >90%O2 with mobility.  Lori [name] B. Migdalia Dk PT, DPT Acute Rehabilitation Services Pager [phone] Office [phone]

## 2019-12-14 NOTE — Progress Notes (Addendum)
PROGRESS NOTE  Lori [name]:[id number] DOB: 08/23/1918 DOA: 12/11/2019 PCP: Leanna Battles, MD   LOS: 3 days   Brief Narrative / Interim history: 86 year old female with history of dementia, A. fib on Pradaxa, HTN, moderate mitral regurg, chronic diastolic CHF, asthma, chronic kidney disease stage IV with baseline Cr 1.9-2, chronic anemia, came into the hospital with concern for worsening confusion from prior facility.  She is unable to provide any history.  There is also reports of decreased p.o. intake and abdominal distention.  Chest x-ray on admission showed suspected multifocal pneumonia with bilateral pleural effusions, she had a WBC of 11.2, K of 5.9 and creatinine was 1.8.  CT scan of the abdomen and pelvis also showed proximal duodenal wall thickening.  Subjective / 24h Interval events: She is much improved this morning, breathing is better, she is alert and oriented x4.  Mental status has returned to baseline.  Denies any shortness of breath  Assessment & Plan: Principal Problem Acute metabolic encephalopathy-This is likely multifactorial in the setting of infectious process or pneumonia, acute on chronic diastolic CHF.  This appears to be resolved in discussing with the daughter.  Active Problems Acute on chronic diastolic CHF-patient was placed on IV Lasix with improvement in her respiratory status.  Her kidney function has remained stable. Last echo was done in March 2021 showed an EF of 55-60%.  A repeat 2D echo shows EF 60-65%, with slight evidence of volume overload with increased RV pressure.  Acute hypoxic respiratory failure-due to acute on chronic diastolic CHF and multifocal pneumonia, still on 2 L, wean off as tolerated  Multifocal pneumonia-based on the chest x-ray, patient was placed on antibiotics with ceftriaxone, azithromycin, and metronidazole.  Transition to orals today.  Chronic kidney disease stage IV-Baseline creatinine 1.9-2, currently at baseline.   Her creatinine was stable with diuretics  Paroxysmal atrial fibrillation - Patient previously on metoprolol / Pradaxa discontinued by home hospice, but resumed that family wishes during her prior hospital stay, currently on Pradaxa and metoprolol.  Continue.  She is currently in A. fib, rate controlled on telemetry.  Duodenitis-unclear etiology, she does not report any symptoms that are GI related, monitor.  She has had 3-4 loose stools per day likely due to antibiotics  Paroxysmal atrial fibrillation-continue Pradaxa, metoprolol  Infrarenal AAA-monitor as an outpatient  Right ovary cystic mass-outpatient follow-up  History of urinary retention-had Foley placed at the beginning of May, this was followed by hospital stay for [place] UTI, discharged on 5/20.  Abdominal pain/distention-this was one of the main triggers that prompted hospitalization, but apparently she has received laxatives at the nursing home followed by several bowel movements and improvement in her symptoms.  This was related by granddaughter.  Palliative care encounter-Long discussion with the daughter over the phone today regarding goals of care and what are the patient's wishes moving forward.  She tells me that the patient would not want to be in and out of the hospital and would like some quality of life and her final days/weeks/months.  She confirmed DNR.  We discussed about filling out a MOST form to further delineate what type of care with the patient be interested in if she is to require acute hospital care.  She is unable to visit the hospital and I have contacted social worker to fax or email her a MOST form so that we can touch base again tomorrow and get this filled.  Scheduled Meds: . amoxicillin-clavulanate  1 tablet Oral BID WC  .  aspirin EC  81 mg Oral Daily  . azithromycin  500 mg Oral Daily  . dabigatran  75 mg Oral BID  . isosorbide mononitrate  15 mg Oral Daily  . metoprolol tartrate  25 mg Oral BID  .  pantoprazole  40 mg Oral Daily  . QUEtiapine  12.5 mg Oral QHS   Continuous Infusions: . sodium chloride 250 mL (12/14/19 0543)   PRN Meds:.sodium chloride, ipratropium-albuterol, loperamide  DVT prophylaxis: Pradaxa Code Status: DNR Family Communication: no family at bedside, discussed with granddaughter over the phone  Status is: Inpatient  Remains inpatient appropriate because: PT therapy evaluation pending, she was not in the rehab portion of the facility prior to coming to the hospital.  She was ambulatory prior to the hospital stay and now she is not.  Dispo: The patient is from: SNF              Anticipated d/c is to: SNF              Anticipated d/c date is: 1 day              Patient currently is not medically stable to d/c.  Consultants:  None   Procedures:  2D echo:  IMPRESSIONS  1. Left ventricular ejection fraction, by estimation, is 60 to 65%. The  left ventricle has normal function. The left ventricle has no regional  wall motion abnormalities. There is moderate left ventricular hypertrophy.  Left ventricular diastolic parameters are indeterminate. There is the interventricular septum is flattened in systole and diastole, consistent with right ventricular pressure and volume overload.  2. Right ventricular systolic function is mildly reduced. The right ventricular size is mildly enlarged. Moderately increased right ventricular wall thickness. There is moderately elevated pulmonary artery systolic pressure. The estimated right ventricular systolic pressure is 69.4 mmHg.  3. Left atrial size was moderately dilated.  4. Right atrial size was moderately dilated.  5. Moderate pleural effusion in the left lateral region.  6. The mitral valve is degenerative. Trivial mitral valve regurgitation. No evidence of mitral stenosis.  7. Tricuspid valve regurgitation is moderate.  8. The aortic valve is tricuspid. Aortic valve regurgitation is trivial. Mild aortic valve  sclerosis is present, with no evidence of aortic valve stenosis.  9. The inferior vena cava is dilated in size with <50% respiratory variability, suggesting right atrial pressure of 15 mmHg.   Microbiology  none  Antimicrobials: Ceftriaxone/azithromycin/metronidazole 6/7 >> 6/10 Augmentin 6/10   Objective: Vitals:   12/14/19 0439 12/14/19 0444 12/14/19 0827 12/14/19 1223  BP:  (!) 113/54 (!) 98/52 (!) 97/53  Pulse:  84 77 88  Resp:  16  15  Temp:  97.9 F (36.6 C) (!) 97.4 F (36.3 C) (!) 97.5 F (36.4 C)  TempSrc:  Oral Oral Oral  SpO2:  99% 98% 97%  Weight: 82.7 kg     Height:        Intake/Output Summary (Last 24 hours) at 12/14/2019 1314 Last data filed at 12/14/2019 1035 Gross per 24 hour  Intake 2148.36 ml  Output 175 ml  Net 1973.36 ml   Filed Weights   12/12/19 1542 12/13/19 0300 12/14/19 0439  Weight: 85.7 kg 81.7 kg 82.7 kg    Examination:  Constitutional: No distress Eyes: No scleral icterus ENMT: mmm Neck: normal, supple Respiratory: No wheezing, no crackles, diminished at the bases Cardiovascular: Regular rate and rhythm, no peripheral edema Abdomen: soft, nt, nd, bs+ Musculoskeletal: no clubbing / cyanosis.  Skin:  no rashes Neurologic: no focal deficits   Data Reviewed: I have independently reviewed following labs and imaging studies   CBC: Recent Labs  Lab 12/11/19 1423 12/12/19 1602 12/13/19 0443  WBC 11.2* 9.6 7.4  NEUTROABS 9.0*  --   --   HGB 11.2* 10.4* 9.5*  HCT 37.6 34.6* 32.1*  MCV 66.3* 66.5* 66.9*  PLT 253 257 295   Basic Metabolic Panel: Recent Labs  Lab 12/11/19 1618 12/11/19 1749 12/12/19 0913 12/13/19 0443  NA 140  --  140 142  K 5.9* 5.8* 5.0 4.2  CL 109  --  110 109  CO2 19*  --  22 23  GLUCOSE 99  --  82 87  BUN 49*  --  50* 50*  CREATININE 1.79*  --  1.80* 1.81*  CALCIUM 9.1  --  8.6* 8.4*   Liver Function Tests: Recent Labs  Lab 12/11/19 1749  AST 19  ALT 14  ALKPHOS 72  BILITOT 1.0  PROT 6.6    ALBUMIN 3.0*   Coagulation Profile: No results for input(s): INR, PROTIME in the last 168 hours. HbA1C: No results for input(s): HGBA1C in the last 72 hours. CBG: No results for input(s): GLUCAP in the last 168 hours.  Recent Results (from the past 240 hour(s))  SARS Coronavirus 2 by RT PCR (hospital order, performed in [facility] hospital lab) Nasopharyngeal Nasopharyngeal Swab     Status: None   Collection Time: 12/11/19  5:45 PM   Specimen: Nasopharyngeal Swab  Result Value Ref Range Status   SARS Coronavirus 2 NEGATIVE NEGATIVE Final    Comment: (NOTE) SARS-CoV-2 target nucleic acids are NOT DETECTED. The SARS-CoV-2 RNA is generally detectable in upper and lower respiratory specimens during the acute phase of infection. The lowest concentration of SARS-CoV-2 viral copies this assay can detect is 250 copies / mL. A negative result does not preclude SARS-CoV-2 infection and should not be used as the sole basis for treatment or other patient management decisions.  A negative result may occur with improper specimen collection / handling, submission of specimen other than nasopharyngeal swab, presence of viral mutation(s) within the areas targeted by this assay, and inadequate number of viral copies (<250 copies / mL). A negative result must be combined with clinical observations, patient history, and epidemiological information. Fact Sheet for Patients:   StrictlyIdeas.no Fact Sheet for Healthcare Providers: BankingDealers.co.za This test is not yet approved or cleared  by the [place] FDA and has been authorized for detection and/or diagnosis of SARS-CoV-2 by FDA under an Emergency Use Authorization (EUA).  This EUA will remain in effect (meaning this test can be used) for the duration of the COVID-19 declaration under Section 564(b)(1) of the Act, 21 U.S.C. section 360bbb-3(b)(1), unless the authorization is terminated  or revoked sooner. Performed at [facility], [place] [address], [place], [place] [address]   Urine culture     Status: Abnormal   Collection Time: 12/11/19  6:31 PM   Specimen: Urine, Clean Catch  Result Value Ref Range Status   Specimen Description URINE, CLEAN CATCH  Final   Special Requests   Final    NONE Performed at [facility], [place] [address], [place],  [address]    Culture MULTIPLE SPECIES PRESENT, SUGGEST RECOLLECTION (A)  Final   Report Status 12/12/2019 FINAL  Final  Blood culture (routine x 2)     Status: None (Preliminary result)   Collection Time: 12/11/19  7:50 PM  Specimen: BLOOD  Result Value Ref Range Status   Specimen Description BLOOD LEFT ANTECUBITAL  Final   Special Requests   Final    BOTTLES DRAWN AEROBIC AND ANAEROBIC Blood Culture results may not be optimal due to an inadequate volume of blood received in culture bottles   Culture   Final    NO GROWTH 3 DAYS Performed at [facility], [place] [address], [place], [place] [address]    Report Status PENDING  Incomplete  Blood culture (routine x 2)     Status: None (Preliminary result)   Collection Time: 12/11/19  7:55 PM   Specimen: BLOOD RIGHT WRIST  Result Value Ref Range Status   Specimen Description BLOOD RIGHT WRIST  Final   Special Requests   Final    BOTTLES DRAWN AEROBIC AND ANAEROBIC Blood Culture results may not be optimal due to an inadequate volume of blood received in culture bottles   Culture   Final    NO GROWTH 3 DAYS Performed at [facility], [place] [address] [place], [place] [address]    Report Status PENDING  Incomplete     Radiology Studies: No results found.  Time spent: 35 minutes, > 50% bedside and in discussions about goals of care/palliative as above  Marzetta Board, MD, PhD [facility]  Between 7 am - 7 pm I am available, please contact me via Amion or Securechat  Between 7 pm - 7 am I am not available, please contact night coverage  MD/APP via Amion

## 2019-12-14 NOTE — TOC Initial Note (Addendum)
Transition of Care [facility]) - Initial/Assessment Note    Patient Details  Name: UDELL BLASINGAME MRN: [id number] Date of Birth: 09-Jan-1919  Transition of Care [facility]) CM/SW Contact:    Arvella Merles, [place] Phone Number: 12/14/2019, 4:45 PM  Clinical Narrative:                 CSW spoke with patient's granddaughter [name] to discuss discharge plan. Granddaughter expressed she spoke with MD and has decided patient will discharge back to [facility] with a request for Palliative to follow. Granddaughter requested MOST form be emailed to her, MD agreed to assist her with completing the form. CSW answered all questions, no further questions expressed at this time. TOC team will continue to follow.  Expected Discharge Plan: Skilled Nursing Facility Barriers to Discharge: Continued Medical Work up   Patient Goals and CMS Choice   CMS Medicare.gov Compare Post Acute Care list provided to:: Patient Represenative (must comment) [name]) Choice offered to / list presented to : Adult Children  Expected Discharge Plan and Services Expected Discharge Plan: [place] Choice: [place] arrangements for the past 2 months: [place]                                      Prior Living Arrangements/Services Living arrangements for the past 2 months: [place] Lives with:: Facility Resident Patient language and need for interpreter reviewed:: Yes Do you feel safe going back to the place where you live?: Yes      Need for Family Participation in Patient Care: Yes (Comment) Care giver support system in place?: Yes (comment)   Criminal Activity/Legal Involvement Pertinent to Current Situation/Hospitalization: No - Comment as needed  Activities of Daily Living      Permission Sought/Granted Permission sought to share information with : Facility Sport and exercise psychologist, Family Supports     Share Information with NAME: [name]  Permission granted to share info w AGENCY: SNF  Permission granted to share info w Relationship: Granddaughter  Permission granted to share info w Contact Information: [phone]  Emotional Assessment   Attitude/Demeanor/Rapport: Unable to Assess Affect (typically observed): Unable to Assess Orientation: : Oriented to Self, Oriented to Place, Oriented to  Time Alcohol / Substance Use: Not Applicable Psych Involvement: No (comment)  Admission diagnosis:  Altered mental status, unspecified altered mental status type [R41.82] Acute on chronic congestive heart failure, unspecified heart failure type ([place]) [I50.9] AMS (altered mental status) [R41.82] Patient Active Problem List   Diagnosis Date Noted  . AMS (altered mental status) 12/11/2019  . Duodenitis 12/11/2019  . Acute on chronic diastolic CHF (congestive heart failure) ([place]) 12/11/2019  . Acute respiratory failure with hypoxia ([place]) 12/11/2019  . Elevated troponin 12/11/2019  . Abdominal aortic aneurysm (AAA) 3.0 cm to 5.0 cm in diameter in female ([place]) 12/11/2019  . Dementia ([place]) 12/11/2019  . Acute lower UTI 11/19/2019  . Acute metabolic encephalopathy 17/61/6073  . Encephalopathy acute 11/19/2019  . Cellulitis 02/26/2018  . Chronic diastolic CHF (congestive heart failure) ([place]) 02/26/2018  . Chronic venous insufficiency 08/17/2017  . Hyperkalemia 08/17/2017  . CKD (chronic kidney disease), stage IV ([place]) 08/17/2017  . Moderate mitral regurgitation 11/04/2016  . Elevated brain natriuretic peptide (BNP) level 11/04/2016  . Pulmonary HTN ([place]) 11/04/2016  . Hyponatremia 07/13/2012    Class: Acute  . Essential  hypertension 03/31/2012  . Atrial fibrillation ([place]) 07/29/2011  . Peripheral edema 07/29/2011  . Cough 06/26/2010  . ANEMIA, IRON DEFICIENCY 06/10/2010  . ANEMIA-UNSPECIFIED 06/10/2010  . Asthma 06/10/2010  . PERSONAL HX COLONIC POLYPS 06/10/2010   PCP:  Leanna Battles, MD Pharmacy:   [id number] [place], [place] [address] Phone: [phone] Fax: [phone]  [facility] [place], [place] [address] Phone: [phone] Fax: [phone]  [facility] [place], [place] [address] [place] [address] Phone: [phone] Fax: [phone]     Social Determinants of Health (SDOH) Interventions    Readmission Risk Interventions No flowsheet data found.

## 2019-12-15 LAB — CBC
HCT: 33.1 % — ABNORMAL LOW (ref 36.0–46.0)
Hemoglobin: 9.7 g/dL — ABNORMAL LOW (ref 12.0–15.0)
MCH: 19.6 pg — ABNORMAL LOW (ref 26.0–34.0)
MCHC: 29.3 g/dL — ABNORMAL LOW (ref 30.0–36.0)
MCV: 66.9 fL — ABNORMAL LOW (ref 80.0–100.0)
Platelets: 237 10*3/uL (ref 150–400)
RBC: 4.95 MIL/uL (ref 3.87–5.11)
RDW: 17.6 % — ABNORMAL HIGH (ref 11.5–15.5)
WBC: 7.8 10*3/uL (ref 4.0–10.5)
nRBC: 0.4 % — ABNORMAL HIGH (ref 0.0–0.2)

## 2019-12-15 LAB — BASIC METABOLIC PANEL
Anion gap: 5 (ref 5–15)
BUN: 46 mg/dL — ABNORMAL HIGH (ref 8–23)
CO2: 25 mmol/L (ref 22–32)
Calcium: 8.4 mg/dL — ABNORMAL LOW (ref 8.9–10.3)
Chloride: 108 mmol/L (ref 98–111)
Creatinine, Ser: 1.83 mg/dL — ABNORMAL HIGH (ref 0.44–1.00)
GFR calc Af Amer: 26 mL/min — ABNORMAL LOW (ref >60)
GFR calc non Af Amer: 23 mL/min — ABNORMAL LOW (ref >60)
Glucose, Bld: 128 mg/dL — ABNORMAL HIGH (ref 70–99)
Potassium: 4.2 mmol/L (ref 3.5–5.1)
Sodium: 138 mmol/L (ref 135–145)

## 2019-12-15 LAB — SARS CORONAVIRUS 2 (TAT 6-24 HRS): SARS Coronavirus 2: NEGATIVE

## 2019-12-15 MED ORDER — FUROSEMIDE 10 MG/ML IJ SOLN
20.0000 mg | Freq: Once | INTRAMUSCULAR | Status: AC
  Administered 2019-12-15: 20 mg via INTRAVENOUS
  Filled 2019-12-15: qty 2

## 2019-12-15 NOTE — Plan of Care (Signed)
  Problem: Safety: Goal: Ability to remain free from injury will improve Outcome: Progressing   

## 2019-12-15 NOTE — Consult Note (Signed)
   [facility] Inpatient Consult   12/15/2019  TEKESHA ALMGREN 06/19/1919 [id number]  [facility] ACO Patient:  Medicare NextGEN  Patient was assessed for [place] for community services. Patient was previously active with [facility].   Patient is being recommended for a skilled nursing facility level of care.  Plan:  If patient transitions to a [facility] affiliated facility will alert the  [facility] RNCM of disposition. Will follow.  Of note, [facility] Care Management services does not replace or interfere with any services that are arranged by inpatient [facility] care management team.   For additional questions or referrals please contact:  Natividad Brood, RN BSN [facility] Liaison  [phone] business mobile phone Toll free office [phone]  Fax number: [phone] [email or web address]

## 2019-12-15 NOTE — Progress Notes (Signed)
PROGRESS NOTE  Lori [name]:[id number] DOB: [birth year] DOA: 12/11/2019 PCP: Leanna Battles, MD   LOS: 4 days   Brief Narrative / Interim history: 86 year old female with history of dementia, A. fib on Pradaxa, HTN, moderate mitral regurg, chronic diastolic CHF, asthma, chronic kidney disease stage IV with baseline Cr 1.9-2, chronic anemia, came into the hospital with concern for worsening confusion from prior facility.  She is unable to provide any history.  There is also reports of decreased p.o. intake and abdominal distention.  Chest x-ray on admission showed suspected multifocal pneumonia with bilateral pleural effusions, she had a WBC of 11.2, K of 5.9 and creatinine was 1.8.  CT scan of the abdomen and pelvis also showed proximal duodenal wall thickening.  Subjective / 24h Interval events: Feels better, still on oxygen.  No chest pain.  Assessment & Plan: Principal Problem Acute metabolic encephalopathy-This is likely multifactorial in the setting of infectious process or pneumonia, acute on chronic diastolic CHF.  This is resolved  Active Problems Acute on chronic diastolic CHF-patient was placed on IV Lasix with improvement in her respiratory status.  Her kidney function has remained stable. Last echo was done in March 2021 showed an EF of 55-60%.  A repeat 2D echo shows EF 60-65%, with slight evidence of volume overload with increased RV pressure.  Has some evidence of fluid overload today, she is up 2 L from admission, will give IV Lasix x1  Acute hypoxic respiratory failure-due to acute on chronic diastolic CHF and multifocal pneumonia, still on 2 L, wean off as tolerated.  We will repeat Lasix today, attempt to wean off to room air  Multifocal pneumonia-based on the chest x-ray, patient was placed on antibiotics with ceftriaxone, azithromycin, and metronidazole, she was transitioned to oral agents  Chronic kidney disease stage IV-Baseline creatinine 1.9-2, currently at  baseline  Paroxysmal atrial fibrillation - Patient previously on metoprolol / Pradaxa discontinued by home hospice, but resumed that family wishes during her prior hospital stay, currently on Pradaxa and metoprolol.  Continue.  She is currently in A. fib, rate controlled on telemetry.  Duodenitis-unclear etiology, she does not report any symptoms that are GI related, monitor.  She has had 3-4 loose stools per day likely due to antibiotics  Paroxysmal atrial fibrillation-continue Pradaxa, metoprolol  Infrarenal AAA-monitor as an outpatient  Right ovary cystic mass-outpatient follow-up  History of urinary retention-had Foley placed at the beginning of May, this was followed by hospital stay for S[place]at[place] UTI, discharged on 5/20.  Abdominal pain/distention-this was one of the main triggers that prompted hospitalization, but apparently she has received laxatives at the nursing home followed by several bowel movements and improvement in her symptoms.  This was related by granddaughter.  Palliative care encounter-discussed with daughter over the phone today regarding goals of care.  We were able to go over the MOST form and she confirmed DNR, comfort measures and do not transfer to hospital unless comfort needs cannot be met in her location at the time, no IV fluids, no antibiotics, no feeding tube.  She will email [place] back to form which will be signed and will have it on her on discharge  Scheduled Meds: . amoxicillin-clavulanate  1 tablet Oral BID WC  . aspirin EC  81 mg Oral Daily  . azithromycin  500 mg Oral Daily  . dabigatran  75 mg Oral BID  . isosorbide mononitrate  15 mg Oral Daily  . metoprolol tartrate  25 mg Oral BID  .  pantoprazole  40 mg Oral Daily  . QUEtiapine  12.5 mg Oral QHS   Continuous Infusions: . sodium chloride 250 mL (12/14/19 0543)   PRN Meds:.sodium chloride, ipratropium-albuterol, loperamide  DVT prophylaxis: Pradaxa Code Status: DNR Family Communication: no  family at bedside, discussed with granddaughter over the phone  Status is: Inpatient  Remains inpatient appropriate because: Receiving IV diuresis, weaning off oxygen Dispo: The patient is from: SNF              Anticipated d/c is to: SNF              Anticipated d/c date is: 1 day              Patient currently is not medically stable to d/c.  Consultants:  None   Procedures:  2D echo:  IMPRESSIONS  1. Left ventricular ejection fraction, by estimation, is 60 to 65%. The  left ventricle has normal function. The left ventricle has no regional  wall motion abnormalities. There is moderate left ventricular hypertrophy.  Left ventricular diastolic parameters are indeterminate. There is the interventricular septum is flattened in systole and diastole, consistent with right ventricular pressure and volume overload.  2. Right ventricular systolic function is mildly reduced. The right ventricular size is mildly enlarged. Moderately increased right ventricular wall thickness. There is moderately elevated pulmonary artery systolic pressure. The estimated right ventricular systolic pressure is 23.3 mmHg.  3. Left atrial size was moderately dilated.  4. Right atrial size was moderately dilated.  5. Moderate pleural effusion in the left lateral region.  6. The mitral valve is degenerative. Trivial mitral valve regurgitation. No evidence of mitral stenosis.  7. Tricuspid valve regurgitation is moderate.  8. The aortic valve is tricuspid. Aortic valve regurgitation is trivial. Mild aortic valve sclerosis is present, with no evidence of aortic valve stenosis.  9. The inferior vena cava is dilated in size with <50% respiratory variability, suggesting right atrial pressure of 15 mmHg.   Microbiology  none  Antimicrobials: Ceftriaxone/azithromycin/metronidazole 6/7 >> 6/10 Augmentin 6/10   Objective: Vitals:   12/14/19 1939 12/15/19 0428 12/15/19 1021 12/15/19 1112  BP: 115/62 (!) 112/48  (!) 102/59 113/68  Pulse: 99 80  79  Resp: '15 20  18  ' Temp: 97.6 F (36.4 C) (!) 97.4 F (36.3 C)  (!) 97.5 F (36.4 C)  TempSrc: Oral Oral  Oral  SpO2: 97% 96%  98%  Weight:  83.3 kg    Height:        Intake/Output Summary (Last 24 hours) at 12/15/2019 1244 Last data filed at 12/15/2019 0076 Gross per 24 hour  Intake 840 ml  Output 140 ml  Net 700 ml   Filed Weights   12/13/19 0300 12/14/19 0439 12/15/19 0428  Weight: 81.7 kg 82.7 kg 83.3 kg    Examination:  Constitutional: NAD Eyes: No icterus ENMT: Moist external drains Neck: normal, supple Respiratory: No wheezing, faint bibasilar crackles Cardiovascular: Regular rate and rhythm, trace edema Abdomen: Soft, NT, ND, bowel sounds positive Musculoskeletal: no clubbing / cyanosis.  Skin: No rashes seen Neurologic: No focal deficits   Data Reviewed: I have independently reviewed following labs and imaging studies   CBC: Recent Labs  Lab 12/11/19 1423 12/12/19 1602 12/13/19 0443 12/15/19 0707  WBC 11.2* 9.6 7.4 7.8  NEUTROABS 9.0*  --   --   --   HGB 11.2* 10.4* 9.5* 9.7*  HCT 37.6 34.6* 32.1* 33.1*  MCV 66.3* 66.5* 66.9* 66.9*  PLT 253 257  244 762   Basic Metabolic Panel: Recent Labs  Lab 12/11/19 1618 12/11/19 1749 12/12/19 0913 12/13/19 0443 12/15/19 0707  NA 140  --  140 142 138  K 5.9* 5.8* 5.0 4.2 4.2  CL 109  --  110 109 108  CO2 19*  --  '22 23 25  ' GLUCOSE 99  --  82 87 128*  BUN 49*  --  50* 50* 46*  CREATININE 1.79*  --  1.80* 1.81* 1.83*  CALCIUM 9.1  --  8.6* 8.4* 8.4*   Liver Function Tests: Recent Labs  Lab 12/11/19 1749  AST 19  ALT 14  ALKPHOS 72  BILITOT 1.0  PROT 6.6  ALBUMIN 3.0*   Coagulation Profile: No results for input(s): INR, PROTIME in the last 168 hours. HbA1C: No results for input(s): HGBA1C in the last 72 hours. CBG: No results for input(s): GLUCAP in the last 168 hours.  Recent Results (from the past 240 hour(s))  SARS Coronavirus 2 by RT PCR (hospital  order, performed in [facility] hospital lab) Nasopharyngeal Nasopharyngeal Swab     Status: None   Collection Time: 12/11/19  5:45 PM   Specimen: Nasopharyngeal Swab  Result Value Ref Range Status   SARS Coronavirus 2 NEGATIVE NEGATIVE Final    Comment: (NOTE) SARS-CoV-2 target nucleic acids are NOT DETECTED. The SARS-CoV-2 RNA is generally detectable in upper and lower respiratory specimens during the acute phase of infection. The lowest concentration of SARS-CoV-2 viral copies this assay can detect is 250 copies / mL. A negative result does not preclude SARS-CoV-2 infection and should not be used as the sole basis for treatment or other patient management decisions.  A negative result may occur with improper specimen collection / handling, submission of specimen other than nasopharyngeal swab, presence of viral mutation(s) within the areas targeted by this assay, and inadequate number of viral copies (<250 copies / mL). A negative result must be combined with clinical observations, patient history, and epidemiological information. Fact Sheet for Patients:   StrictlyIdeas.no Fact Sheet for Healthcare Providers: BankingDealers.co.za This test is not yet approved or cleared  by the [place] FDA and has been authorized for detection and/or diagnosis of SARS-CoV-2 by FDA under an Emergency Use Authorization (EUA).  This EUA will remain in effect (meaning this test can be used) for the duration of the COVID-19 declaration under Section 564(b)(1) of the Act, 21 U.S.C. section 360bbb-3(b)(1), unless the authorization is terminated or revoked sooner. Performed at [facility], [place] [address] [place], [place] [address]   Urine culture     Status: Abnormal   Collection Time: 12/11/19  6:31 PM   Specimen: Urine, Clean Catch  Result Value Ref Range Status   Specimen Description URINE, CLEAN CATCH  Final   Special Requests   Final     NONE Performed at [facility], [place] [address], [place], [place] [address]    Culture MULTIPLE SPECIES PRESENT, SUGGEST RECOLLECTION (A)  Final   Report Status 12/12/2019 FINAL  Final  Blood culture (routine x 2)     Status: None (Preliminary result)   Collection Time: 12/11/19  7:50 PM   Specimen: BLOOD  Result Value Ref Range Status   Specimen Description BLOOD LEFT ANTECUBITAL  Final   Special Requests   Final    BOTTLES DRAWN AEROBIC AND ANAEROBIC Blood Culture results may not be optimal due to an inadequate volume of blood received in culture bottles   Culture  Final    NO GROWTH 4 DAYS Performed at [facility], [place] [address], [place], [place] [address]    Report Status PENDING  Incomplete  Blood culture (routine x 2)     Status: None (Preliminary result)   Collection Time: 12/11/19  7:55 PM   Specimen: BLOOD RIGHT WRIST  Result Value Ref Range Status   Specimen Description BLOOD RIGHT WRIST  Final   Special Requests   Final    BOTTLES DRAWN AEROBIC AND ANAEROBIC Blood Culture results may not be optimal due to an inadequate volume of blood received in culture bottles   Culture   Final    NO GROWTH 4 DAYS Performed at [facility], [place] [address], [place], [place] [address]    Report Status PENDING  Incomplete     Radiology Studies: No results found.  Time spent: 35 minutes, > 50% bedside and in discussions about goals of care/palliative as above  Marzetta Board, MD, PhD [facility]  Between 7 am - 7 pm I am available, please contact me via Amion or Securechat  Between 7 pm - 7 am I am not available, please contact night coverage MD/APP via Amion

## 2019-12-15 NOTE — Care Management Important Message (Signed)
Important Message  Patient Details  Name: Lori [name] MRN: [id number] Date of Birth: [birth year]   Medicare Important Message Given:  Yes     Shelda Altes 12/15/2019, 8:47 AM

## 2019-12-15 NOTE — Progress Notes (Signed)
OT Cancellation Note  Patient Details Name: Lori [name] MRN: [id number] DOB: [birth year]   Cancelled Treatment:    Reason Eval/Treat Not Completed: Other (comment). Pt from SNF and plan is to return to SNF with comfort measures. Will defer any OT needs to SNF.   Janea Schwenn,HILLARY 12/15/2019, 3:13 PM  Maurie Boettcher, OT/L   Acute OT Clinical Specialist Acute Rehabilitation Services Pager [phone] Office [phone]

## 2019-12-16 DIAGNOSIS — G9341 Metabolic encephalopathy: Secondary | ICD-10-CM | POA: Diagnosis not present

## 2019-12-16 DIAGNOSIS — R451 Restlessness and agitation: Secondary | ICD-10-CM | POA: Diagnosis not present

## 2019-12-16 DIAGNOSIS — R54 Age-related physical debility: Secondary | ICD-10-CM | POA: Diagnosis not present

## 2019-12-16 DIAGNOSIS — N390 Urinary tract infection, site not specified: Secondary | ICD-10-CM | POA: Diagnosis not present

## 2019-12-16 DIAGNOSIS — I5033 Acute on chronic diastolic (congestive) heart failure: Secondary | ICD-10-CM | POA: Diagnosis not present

## 2019-12-16 DIAGNOSIS — R410 Disorientation, unspecified: Secondary | ICD-10-CM | POA: Diagnosis not present

## 2019-12-16 DIAGNOSIS — I5032 Chronic diastolic (congestive) heart failure: Secondary | ICD-10-CM | POA: Diagnosis not present

## 2019-12-16 DIAGNOSIS — Z7401 Bed confinement status: Secondary | ICD-10-CM | POA: Diagnosis not present

## 2019-12-16 DIAGNOSIS — Z8701 Personal history of pneumonia (recurrent): Secondary | ICD-10-CM | POA: Diagnosis not present

## 2019-12-16 DIAGNOSIS — I480 Paroxysmal atrial fibrillation: Secondary | ICD-10-CM | POA: Diagnosis not present

## 2019-12-16 DIAGNOSIS — J189 Pneumonia, unspecified organism: Secondary | ICD-10-CM | POA: Diagnosis not present

## 2019-12-16 DIAGNOSIS — J9601 Acute respiratory failure with hypoxia: Secondary | ICD-10-CM | POA: Diagnosis not present

## 2019-12-16 DIAGNOSIS — R278 Other lack of coordination: Secondary | ICD-10-CM | POA: Diagnosis not present

## 2019-12-16 DIAGNOSIS — L89150 Pressure ulcer of sacral region, unstageable: Secondary | ICD-10-CM | POA: Diagnosis not present

## 2019-12-16 DIAGNOSIS — F411 Generalized anxiety disorder: Secondary | ICD-10-CM | POA: Diagnosis not present

## 2019-12-16 DIAGNOSIS — M2550 Pain in unspecified joint: Secondary | ICD-10-CM | POA: Diagnosis not present

## 2019-12-16 DIAGNOSIS — M6281 Muscle weakness (generalized): Secondary | ICD-10-CM | POA: Diagnosis not present

## 2019-12-16 DIAGNOSIS — I872 Venous insufficiency (chronic) (peripheral): Secondary | ICD-10-CM | POA: Diagnosis not present

## 2019-12-16 DIAGNOSIS — N184 Chronic kidney disease, stage 4 (severe): Secondary | ICD-10-CM | POA: Diagnosis not present

## 2019-12-16 DIAGNOSIS — R2689 Other abnormalities of gait and mobility: Secondary | ICD-10-CM | POA: Diagnosis not present

## 2019-12-16 DIAGNOSIS — I509 Heart failure, unspecified: Secondary | ICD-10-CM | POA: Diagnosis not present

## 2019-12-16 DIAGNOSIS — R4182 Altered mental status, unspecified: Secondary | ICD-10-CM | POA: Diagnosis not present

## 2019-12-16 DIAGNOSIS — I714 Abdominal aortic aneurysm, without rupture: Secondary | ICD-10-CM | POA: Diagnosis not present

## 2019-12-16 DIAGNOSIS — R05 Cough: Secondary | ICD-10-CM | POA: Diagnosis not present

## 2019-12-16 DIAGNOSIS — R0989 Other specified symptoms and signs involving the circulatory and respiratory systems: Secondary | ICD-10-CM | POA: Diagnosis not present

## 2019-12-16 DIAGNOSIS — I340 Nonrheumatic mitral (valve) insufficiency: Secondary | ICD-10-CM | POA: Diagnosis not present

## 2019-12-16 DIAGNOSIS — R627 Adult failure to thrive: Secondary | ICD-10-CM | POA: Diagnosis not present

## 2019-12-16 DIAGNOSIS — I10 Essential (primary) hypertension: Secondary | ICD-10-CM | POA: Diagnosis not present

## 2019-12-16 DIAGNOSIS — F05 Delirium due to known physiological condition: Secondary | ICD-10-CM | POA: Diagnosis not present

## 2019-12-16 DIAGNOSIS — R5383 Other fatigue: Secondary | ICD-10-CM | POA: Diagnosis not present

## 2019-12-16 DIAGNOSIS — Z7901 Long term (current) use of anticoagulants: Secondary | ICD-10-CM | POA: Diagnosis not present

## 2019-12-16 DIAGNOSIS — H9193 Unspecified hearing loss, bilateral: Secondary | ICD-10-CM | POA: Diagnosis not present

## 2019-12-16 DIAGNOSIS — R0602 Shortness of breath: Secondary | ICD-10-CM | POA: Diagnosis not present

## 2019-12-16 DIAGNOSIS — R339 Retention of urine, unspecified: Secondary | ICD-10-CM | POA: Diagnosis not present

## 2019-12-16 DIAGNOSIS — R531 Weakness: Secondary | ICD-10-CM | POA: Diagnosis not present

## 2019-12-16 LAB — CULTURE, BLOOD (ROUTINE X 2)
Culture: NO GROWTH
Culture: NO GROWTH

## 2019-12-16 MED ORDER — SPIRONOLACTONE 25 MG PO TABS
25.0000 mg | ORAL_TABLET | ORAL | 11 refills | Status: AC
Start: 2019-12-18 — End: 2020-12-17

## 2019-12-16 MED ORDER — AMOXICILLIN-POT CLAVULANATE 875-125 MG PO TABS
1.0000 | ORAL_TABLET | Freq: Two times a day (BID) | ORAL | 0 refills | Status: AC
Start: 2019-12-16 — End: 2019-12-19

## 2019-12-16 MED ORDER — FUROSEMIDE 20 MG PO TABS: 40.0000 mg | ORAL_TABLET | Freq: Every day | ORAL | 11 refills | Status: AC

## 2019-12-16 MED ORDER — FUROSEMIDE 20 MG PO TABS
20.0000 mg | ORAL_TABLET | ORAL | 11 refills | Status: DC
Start: 2019-12-16 — End: 2019-12-16

## 2019-12-16 MED ORDER — OMEPRAZOLE 20 MG PO CPDR: 40.0000 mg | DELAYED_RELEASE_CAPSULE | Freq: Two times a day (BID) | ORAL | 1 refills | Status: AC

## 2019-12-16 NOTE — Discharge Summary (Addendum)
Physician Discharge Summary  [name] WLN:[id number] DOB: 05-27-1919 DOA: 12/11/2019  PCP: Leanna Battles, MD  Admit date: 12/11/2019 Discharge date: 12/16/2019  Admitted From: SNF Disposition:  SNF  Recommendations for Outpatient Follow-up:  1. Follow up with PCP in 1-2 weeks 2. Continue Augmentin for 3 more days 3. Patient was discharged with a MOST form  Home Health: none Equipment/Devices: none  Discharge Condition: stable CODE STATUS: DNR Diet recommendation: regular  HPI: Per admitting MD, Lori [name] is a 86 y.o. female with medical history significant for dementia, atrial fibrillation on Pradaxa, hypertension, moderate mitral regurgitation, chronic diastolic heart failure, asthma, CKD stage IV, anemia who presents with concerns of altered mental status from her facility. Patient unable to provide history given her dementia.  Per ED documentation, family called patient today and noted increased confusion.  Also reports patient has had decreased p.o. intake and abdominal distention.  No changes per nursing staff at the facility. Patient had persistent cough during evaluation with sputum production.  She does complain of abdominal pain although did not do so during actual abdominal exam.  Hospital Course / Discharge diagnoses: Principal Problem Acute metabolic encephalopathy-This is likely multifactorial in the setting of infectious process with pneumonia, acute on chronic diastolic CHF.   Her encephalopathy has resolved, she is alert and oriented x4, of note, patient does not have underlying dementia and she is not on Seroquel which has been discontinued  Active Problems Acute on chronic diastolic CHF-patient was placed on IV Lasix with improvement in her respiratory status.  Her kidney function has remained stable. Last echo was done in March 2021 showed an EF of 55-60%.  A repeat 2D echo shows EF 60-65%, with slight evidence of volume overload with increased RV  pressure. During this admission patient did not appear to be on diuretics however on discussing with the family she is in fact on 40 mg of furosemide daily and 25 mg of spironolactone MWF. This will be continued on discharge Acute hypoxic respiratory failure-due to acute on chronic diastolic CHF and multifocal pneumonia, with diuresis and antibiotics and this has resolved and she is stable on room air Multifocal pneumonia-based on the chest x-ray, patient was placed on antibiotics with ceftriaxone, azithromycin, and metronidazole, she was transitioned to oral Augmentin and she is to complete 3 additional days for a total of 7-day course Chronic kidney disease stage IV-Baseline creatinine 1.9-2, currently at baseline Paroxysmal atrial fibrillation - Patient previously on metoprolol / Pradaxadiscontinued by home hospice, but resumed that family wishes during her prior hospital stay, currently on Pradaxa and metoprolol.  Continue.  She is currently in A. fib, rate controlled on telemetry. On admission patient appears to be on aspirin however per granddaughter she is only taking Pradaxa and no antiplatelets. Of note, she is not on Imdur Duodenitis-unclear etiology, she does not report any symptoms that are GI related. Continue home PPI Paroxysmal atrial fibrillation-continue Pradaxa, metoprolol Infrarenal AAA-monitor as an outpatient Right ovary cystic mass-outpatient follow-up History of urinary retention-had Foley placed at the beginning of May, this was followed by hospital stay for Serratia UTI, discharged on 5/20. Abdominal pain/distention-this was one of the main triggers that prompted hospitalization, but apparently she has received laxatives at the nursing home followed by several bowel movements and improvement in her symptoms. Currently she reports no symptoms   Discharge Instructions   Allergies as of 12/16/2019      Reactions   Dust Mite Extract Shortness Of Breath, Itching  Mold Extract  [trichophyton Mentagrophyte] Shortness Of Breath, Itching, Cough   VERY ALLERGIC/EXPOSURE RESULTS IN TREMENDOUS COUGHING (just one symptom)   Latex Itching   And certain fabrics   Tape Other (See Comments)   SKIN IS VERY THIN AND WILL TEAR AND BRUISE EASILY!! PLEASE USE COBAN WRAP-      Medication List    STOP taking these medications   aspirin EC 81 MG tablet   isosorbide mononitrate 30 MG 24 hr tablet Commonly known as: IMDUR   QUEtiapine 25 MG tablet Commonly known as: SEROQUEL   sulfamethoxazole-trimethoprim 800-160 MG tablet Commonly known as: BACTRIM DS     TAKE these medications   amoxicillin-clavulanate 875-125 MG tablet Commonly known as: Augmentin Take 1 tablet by mouth 2 (two) times daily for 3 days.   dabigatran 75 MG Caps capsule Commonly known as: Pradaxa TAKE 1 CAPSULE EVERY 12 HOURS What changed:   how much to take  how to take this  when to take this  additional instructions   furosemide 20 MG tablet Commonly known as: Lasix Take 2 tablets (40 mg total) by mouth daily.   ipratropium 0.03 % nasal spray Commonly known as: ATROVENT Place 1 spray into both nostrils at bedtime.   metoprolol tartrate 25 MG tablet Commonly known as: LOPRESSOR Take 1 tablet (25 mg total) by mouth 2 (two) times daily.   omeprazole 20 MG capsule Commonly known as: PriLOSEC Take 2 capsules (40 mg total) by mouth 2 (two) times daily before a meal.   polyethylene glycol 17 g packet Commonly known as: MIRALAX / GLYCOLAX Take 17 g by mouth daily as needed for mild constipation.   spironolactone 25 MG tablet Commonly known as: Aldactone Take 1 tablet (25 mg total) by mouth every Monday, Wednesday, and Friday. Start taking on: December 18, 2019       Consultations:  None   Procedures/Studies:  CT ABDOMEN PELVIS WO CONTRAST  Result Date: 12/11/2019 CLINICAL DATA:  Abdominal distension. EXAM: CT ABDOMEN AND PELVIS WITHOUT CONTRAST TECHNIQUE: Multidetector CT  imaging of the abdomen and pelvis was performed following the standard protocol without IV contrast. COMPARISON:  None. FINDINGS: Lower chest: There are moderate-sized bilateral pleural effusions that are only partially visualized. There is atelectasis at the lung bases.The heart is enlarged. Thoracic aortic calcifications are noted. There are coronary artery calcifications. Hepatobiliary: The liver surface appears nodular raising concern for underlying cirrhosis. There is diffuse gallbladder wall thickening which is nonspecific.There is no biliary ductal dilation. Pancreas: Normal contours without ductal dilatation. No peripancreatic fluid collection. Spleen: Unremarkable. Adrenals/Urinary Tract: --Adrenal glands: Unremarkable. --Right kidney/ureter: The right kidney is atrophic. --Left kidney/ureter: The left kidney is atrophic. --Urinary bladder: Unremarkable. Stomach/Bowel: --Stomach/Duodenum: The stomach is unremarkable. There appears to be wall thickening of the proximal duodenum. --Small bowel: Unremarkable. --Colon: Unremarkable. --Appendix: Normal. Vascular/Lymphatic: There are atherosclerotic changes of the abdominal aorta. There is an infrarenal abdominal aortic aneurysm measuring approximately 3.9 cm. --No retroperitoneal lymphadenopathy. --No mesenteric lymphadenopathy. --No pelvic or inguinal lymphadenopathy. Reproductive: There is a cystic mass involving the right ovary measuring approximately 9.6 cm. Other: There is a small amount of free fluid in the patient's abdomen. The abdominal wall is normal. Musculoskeletal. No acute displaced fractures. IMPRESSION: 1. There appears to be wall thickening of the proximal duodenum, which may represent duodenitis. 2. There is a 9.6 cm cystic mass involving the right ovary. Follow-up with a nonemergent outpatient pelvic ultrasound is recommended. 3. There are moderate-sized bilateral pleural effusions that are only  partially visualized. There is atelectasis at  the lung bases. 4. Infrarenal abdominal aortic aneurysm measuring 3.9 cm. Recommend followup by ultrasound in 2 years. This recommendation follows ACR consensus guidelines: White Paper of the ACR Incidental Findings Committee II on Vascular Findings. J Am Coll Radiol 2013; 10:789-794. Aortic aneurysm NOS (ICD10-I71.9) 5. The liver surface appears nodular raising concern for underlying cirrhosis. 6. Bilateral atrophic kidneys. 7. Trace ascites. 8. Gallbladder wall thickening which is nonspecific and may be secondary to the presence of ascites or heart disease. If there is clinical concern for acute cholecystitis, follow-up with ultrasound is recommended. Aortic Atherosclerosis (ICD10-I70.0). Electronically Signed   By: Constance Holster M.D.   On: 12/11/2019 19:58   DG Chest 1 View  Result Date: 12/11/2019 CLINICAL DATA:  Altered mental status/weakness EXAM: CHEST  1 VIEW COMPARISON:  Nov 19, 2019 FINDINGS: There are pleural effusions bilaterally. There is airspace opacity throughout much of the right mid and lower lung regions. There is subtle ill-defined opacity in the left mid lung. There is cardiomegaly. The pulmonary vascularity is normal. There is aortic atherosclerosis. No adenopathy appreciable. No bone lesions. IMPRESSION: Multifocal airspace opacity, considerably more on the right than on the left. Suspect multifocal pneumonia, although a degree of underlying pulmonary edema is possible. There is cardiomegaly with pleural effusions bilaterally. There may be a degree of congestive heart failure with associated pneumonia. Aortic Atherosclerosis (ICD10-I70.0). Electronically Signed   By: Lowella Grip III M.D.   On: 12/11/2019 18:03   DG Chest 2 View  Result Date: 11/19/2019 CLINICAL DATA:  Transient delirium, agitation. Rule out infection. EXAM: CHEST - 2 VIEW COMPARISON:  Chest x-rays dated 09/20/2019 and 02/25/2018. FINDINGS: Stable hazy opacity at the RIGHT lung base. Stable denser opacity at  the LEFT lung base. Coarse interstitial markings are again seen throughout both lungs suggesting chronic interstitial lung disease. Stable mild cardiomegaly. No acute appearing osseous abnormality. IMPRESSION: 1. No significant change compared to most recent chest x-ray of 09/20/2019. Stable bibasilar opacities, at least some component due to small bilateral pleural effusions. Superimposed atelectasis is likely. 2. Stable mild cardiomegaly. 3. Probable chronic interstitial lung disease. Electronically Signed   By: Franki Cabot M.D.   On: 11/19/2019 10:48   CT Head Wo Contrast  Result Date: 12/11/2019 CLINICAL DATA:  Altered mental status. EXAM: CT HEAD WITHOUT CONTRAST TECHNIQUE: Contiguous axial images were obtained from the base of the skull through the vertex without intravenous contrast. COMPARISON:  None. FINDINGS: Brain: No evidence of acute infarction, hemorrhage, hydrocephalus, extra-axial collection or mass lesion/mass effect. Mild-to-moderate generalized cerebral atrophy. Vascular: Atherosclerotic vascular calcification of the carotid siphons. No hyperdense vessel. Skull: Normal. Negative for fracture or focal lesion. Sinuses/Orbits: No acute finding. Other: None. IMPRESSION: 1. No acute intracranial abnormality. Electronically Signed   By: Titus Dubin M.D.   On: 12/11/2019 19:52   ECHOCARDIOGRAM COMPLETE  Result Date: 12/12/2019    ECHOCARDIOGRAM REPORT   Patient Name:   BERENICE OEHLERT Date of Exam: 12/12/2019 Medical Rec #:  [id number]       Height:       64.0 in Accession #:    [id number]      Weight:       180.0 lb Date of Birth:  28-Mar-1919      BSA:          1.871 m Patient Age:    42 years        BP:  127/71 mmHg Patient Gender: F               HR:           80 bpm. Exam Location:  Inpatient Procedure: 2D Echo, Color Doppler and Cardiac Doppler Indications:    R44.31 Acute diastolic (congestive) heart failure  History:        Patient has prior history of Echocardiogram  examinations, most                 recent 09/21/2019. CHF, Pulmonary HTN, Arrythmias:Atrial                 Fibrillation; Risk Factors:Hypertension and Dyslipidemia.  Sonographer:    Raquel Sarna Senior RDCS Referring Phys: [id number] [place]  1. Left ventricular ejection fraction, by estimation, is 60 to 65%. The left ventricle has normal function. The left ventricle has no regional wall motion abnormalities. There is moderate left ventricular hypertrophy. Left ventricular diastolic parameters are indeterminate. There is the interventricular septum is flattened in systole and diastole, consistent with right ventricular pressure and volume overload.  2. Right ventricular systolic function is mildly reduced. The right ventricular size is mildly enlarged. Moderately increased right ventricular wall thickness. There is moderately elevated pulmonary artery systolic pressure. The estimated right ventricular systolic pressure is 61.9 mmHg.  3. Left atrial size was moderately dilated.  4. Right atrial size was moderately dilated.  5. Moderate pleural effusion in the left lateral region.  6. The mitral valve is degenerative. Trivial mitral valve regurgitation. No evidence of mitral stenosis.  7. Tricuspid valve regurgitation is moderate.  8. The aortic valve is tricuspid. Aortic valve regurgitation is trivial. Mild aortic valve sclerosis is present, with no evidence of aortic valve stenosis.  9. The inferior vena cava is dilated in size with <50% respiratory variability, suggesting right atrial pressure of 15 mmHg. FINDINGS  Left Ventricle: Left ventricular ejection fraction, by estimation, is 60 to 65%. The left ventricle has normal function. The left ventricle has no regional wall motion abnormalities. The left ventricular internal cavity size was normal in size. There is  moderate left ventricular hypertrophy. The interventricular septum is flattened in systole and diastole, consistent with right ventricular pressure  and volume overload. Left ventricular diastolic parameters are indeterminate. Right Ventricle: The right ventricular size is mildly enlarged. Moderately increased right ventricular wall thickness. Right ventricular systolic function is mildly reduced. There is moderately elevated pulmonary artery systolic pressure. The tricuspid regurgitant velocity is 2.74 m/s, and with an assumed right atrial pressure of 15 mmHg, the estimated right ventricular systolic pressure is 50.9 mmHg. Left Atrium: Left atrial size was moderately dilated. Right Atrium: Right atrial size was moderately dilated. Pericardium: A small pericardial effusion is present. Presence of pericardial fat pad. Mitral Valve: The mitral valve is degenerative in appearance. Moderate mitral annular calcification. Trivial mitral valve regurgitation. No evidence of mitral valve stenosis. MV peak gradient, 8.1 mmHg. The mean mitral valve gradient is 2.0 mmHg. Tricuspid Valve: The tricuspid valve is normal in structure. Tricuspid valve regurgitation is moderate. Aortic Valve: The aortic valve is tricuspid. Aortic valve regurgitation is trivial. Mild aortic valve sclerosis is present, with no evidence of aortic valve stenosis. Pulmonic Valve: The pulmonic valve was normal in structure. Pulmonic valve regurgitation is trivial. Aorta: The aortic root and ascending aorta are structurally normal, with no evidence of dilitation. Venous: The inferior vena cava is dilated in size with less than 50% respiratory variability, suggesting right atrial pressure of  15 mmHg. IAS/Shunts: No atrial level shunt detected by color flow Doppler. Additional Comments: There is a moderate pleural effusion in the left lateral region.  LEFT VENTRICLE PLAX 2D LVIDd:         2.90 cm  Diastology LVIDs:         1.90 cm  LV e' lateral:   5.66 cm/s LV PW:         1.40 cm  LV E/e' lateral: 22.8 LV IVS:        1.20 cm  LV e' medial:    4.46 cm/s LVOT diam:     2.00 cm  LV E/e' medial:  28.9 LV SV:          67 LV SV Index:   36 LVOT Area:     3.14 cm  RIGHT VENTRICLE RV S prime:     5.33 cm/s TAPSE (M-mode): 1.3 cm LEFT ATRIUM             Index       RIGHT ATRIUM           Index LA diam:        4.50 cm 2.41 cm/m  RA Area:     27.50 cm LA Vol (A2C):   83.4 ml 44.58 ml/m RA Volume:   82.50 ml  44.10 ml/m LA Vol (A4C):   73.2 ml 39.13 ml/m LA Biplane Vol: 77.8 ml 41.59 ml/m  AORTIC VALVE LVOT Vmax:   89.70 cm/s LVOT Vmean:  66.900 cm/s LVOT VTI:    0.214 m  AORTA Ao Root diam: 3.00 cm Ao Asc diam:  3.10 cm MITRAL VALVE                TRICUSPID VALVE MV Area (PHT): 3.48 cm     TR Peak grad:   30.0 mmHg MV Peak grad:  8.1 mmHg     TR Vmax:        274.00 cm/s MV Mean grad:  2.0 mmHg MV Vmax:       1.42 m/s     SHUNTS MV Vmean:      69.9 cm/s    Systemic VTI:  0.21 m MV Decel Time: 218 msec     Systemic Diam: 2.00 cm MV E velocity: 129.00 cm/s MV A velocity: 31.80 cm/s MV E/A ratio:  4.06 Oswaldo Milian MD Electronically signed by Oswaldo Milian MD Signature Date/Time: 12/12/2019/3:07:50 PM    Final      Subjective: - no chest pain, shortness of breath, no abdominal pain, nausea or vomiting.   Discharge Exam: BP 118/64 (BP Location: Left Arm)   Pulse 70   Temp 97.9 F (36.6 C) (Oral)   Resp 18   Ht 5\' 5"  (1.651 m)   Wt 82.7 kg Comment: scale b  SpO2 90%   BMI 30.35 kg/m   General: Pt is alert, awake, not in acute distress Cardiovascular: RRR, S1/S2 +, no rubs, no gallops Respiratory: CTA bilaterally, no wheezing, no rhonchi Abdominal: Soft, NT, ND, bowel sounds + Extremities: no edema, no cyanosis    The results of significant diagnostics from this hospitalization (including imaging, microbiology, ancillary and laboratory) are listed below for reference.     Microbiology: Recent Results (from the past 240 hour(s))  SARS Coronavirus 2 by RT PCR (hospital order, performed in [facility] hospital lab) Nasopharyngeal Nasopharyngeal Swab     Status: None   Collection Time:  12/11/19  5:45 PM   Specimen: Nasopharyngeal Swab  Result Value Ref  Range Status   SARS Coronavirus 2 NEGATIVE NEGATIVE Final    Comment: (NOTE) SARS-CoV-2 target nucleic acids are NOT DETECTED. The SARS-CoV-2 RNA is generally detectable in upper and lower respiratory specimens during the acute phase of infection. The lowest concentration of SARS-CoV-2 viral copies this assay can detect is 250 copies / mL. A negative result does not preclude SARS-CoV-2 infection and should not be used as the sole basis for treatment or other patient management decisions.  A negative result may occur with improper specimen collection / handling, submission of specimen other than nasopharyngeal swab, presence of viral mutation(s) within the areas targeted by this assay, and inadequate number of viral copies (<250 copies / mL). A negative result must be combined with clinical observations, patient history, and epidemiological information. Fact Sheet for Patients:   StrictlyIdeas.no Fact Sheet for Healthcare Providers: BankingDealers.co.za This test is not yet approved or cleared  by the [place] FDA and has been authorized for detection and/or diagnosis of SARS-CoV-2 by FDA under an Emergency Use Authorization (EUA).  This EUA will remain in effect (meaning this test can be used) for the duration of the COVID-19 declaration under Section 564(b)(1) of the Act, 21 U.S.C. section 360bbb-3(b)(1), unless the authorization is terminated or revoked sooner. Performed at [facility], [place] [address] [place], [place] [address]   Urine culture     Status: Abnormal   Collection Time: 12/11/19  6:31 PM   Specimen: Urine, Clean Catch  Result Value Ref Range Status   Specimen Description URINE, CLEAN CATCH  Final   Special Requests   Final    NONE Performed at [facility], [place] [address], [place], [place] [address]    Culture MULTIPLE SPECIES  PRESENT, SUGGEST RECOLLECTION (A)  Final   Report Status 12/12/2019 FINAL  Final  Blood culture (routine x 2)     Status: None   Collection Time: 12/11/19  7:50 PM   Specimen: BLOOD  Result Value Ref Range Status   Specimen Description BLOOD LEFT ANTECUBITAL  Final   Special Requests   Final    BOTTLES DRAWN AEROBIC AND ANAEROBIC Blood Culture results may not be optimal due to an inadequate volume of blood received in culture bottles   Culture   Final    NO GROWTH 5 DAYS Performed at [facility], [place] [address] [place], [place] [address]    Report Status 12/16/2019 FINAL  Final  Blood culture (routine x 2)     Status: None   Collection Time: 12/11/19  7:55 PM   Specimen: BLOOD RIGHT WRIST  Result Value Ref Range Status   Specimen Description BLOOD RIGHT WRIST  Final   Special Requests   Final    BOTTLES DRAWN AEROBIC AND ANAEROBIC Blood Culture results may not be optimal due to an inadequate volume of blood received in culture bottles   Culture   Final    NO GROWTH 5 DAYS Performed at [facility], [place] [address], [place], [place] [address]    Report Status 12/16/2019 FINAL  Final  SARS CORONAVIRUS 2 (TAT 6-24 HRS) Nasopharyngeal Nasopharyngeal Swab     Status: None   Collection Time: 12/15/19 11:06 AM   Specimen: Nasopharyngeal Swab  Result Value Ref Range Status   SARS Coronavirus 2 NEGATIVE NEGATIVE Final    Comment: (NOTE) SARS-CoV-2 target nucleic acids are NOT DETECTED.  The SARS-CoV-2 RNA is generally detectable in upper and lower respiratory specimens during the acute  phase of infection. Negative results do not preclude SARS-CoV-2 infection, do not rule out co-infections with other pathogens, and should not be used as the sole basis for treatment or other patient management decisions. Negative results must be combined with clinical observations, patient history, and epidemiological information. The expected result is Negative.  Fact Sheet for  Patients: SugarRoll.be  Fact Sheet for Healthcare Providers: https://www.woods-mathews.com/  This test is not yet approved or cleared by the [place] FDA and  has been authorized for detection and/or diagnosis of SARS-CoV-2 by FDA under an Emergency Use Authorization (EUA). This EUA will remain  in effect (meaning this test can be used) for the duration of the COVID-19 declaration under Se ction 564(b)(1) of the Act, 21 U.S.C. section 360bbb-3(b)(1), unless the authorization is terminated or revoked sooner.  Performed at [facility], [place] [address] [place], [place] [address]      Labs: Basic Metabolic Panel: Recent Labs  Lab 12/11/19 1618 12/11/19 1749 12/12/19 0913 12/13/19 0443 12/15/19 0707  NA 140  --  140 142 138  K 5.9* 5.8* 5.0 4.2 4.2  CL 109  --  110 109 108  CO2 19*  --  22 23 25   GLUCOSE 99  --  82 87 128*  BUN 49*  --  50* 50* 46*  CREATININE 1.79*  --  1.80* 1.81* 1.83*  CALCIUM 9.1  --  8.6* 8.4* 8.4*   Liver Function Tests: Recent Labs  Lab 12/11/19 1749  AST 19  ALT 14  ALKPHOS 72  BILITOT 1.0  PROT 6.6  ALBUMIN 3.0*   CBC: Recent Labs  Lab 12/11/19 1423 12/12/19 1602 12/13/19 0443 12/15/19 0707  WBC 11.2* 9.6 7.4 7.8  NEUTROABS 9.0*  --   --   --   HGB 11.2* 10.4* 9.5* 9.7*  HCT 37.6 34.6* 32.1* 33.1*  MCV 66.3* 66.5* 66.9* 66.9*  PLT 253 257 244 237   CBG: No results for input(s): GLUCAP in the last 168 hours. Hgb A1c No results for input(s): HGBA1C in the last 72 hours. Lipid Profile No results for input(s): CHOL, HDL, LDLCALC, TRIG, CHOLHDL, LDLDIRECT in the last 72 hours. Thyroid function studies No results for input(s): TSH, T4TOTAL, T3FREE, THYROIDAB in the last 72 hours.  Invalid input(s): FREET3 Urinalysis    Component Value Date/Time   COLORURINE YELLOW 12/11/2019 1423   APPEARANCEUR CLEAR 12/11/2019 1423   LABSPEC 1.024 12/11/2019 1423   PHURINE 5.0 12/11/2019  1423   GLUCOSEU NEGATIVE 12/11/2019 1423   HGBUR NEGATIVE 12/11/2019 1423   BILIRUBINUR NEGATIVE 12/11/2019 1423   KETONESUR 5 (A) 12/11/2019 1423   PROTEINUR 30 (A) 12/11/2019 1423   UROBILINOGEN 0.2 07/10/2012 1122   NITRITE NEGATIVE 12/11/2019 1423   LEUKOCYTESUR NEGATIVE 12/11/2019 1423    FURTHER DISCHARGE INSTRUCTIONS:   Get Medicines reviewed and adjusted: Please take all your medications with you for your next visit with your Primary MD   Laboratory/radiological data: Please request your Primary MD to go over all hospital tests and procedure/radiological results at the follow up, please ask your Primary MD to get all Hospital records sent to his/her office.   In some cases, they will be blood work, cultures and biopsy results pending at the time of your discharge. Please request that your primary care M.D. goes through all the records of your hospital data and follows up on these results.   Also Note the following: If you experience worsening of your admission symptoms, develop shortness of breath,  life threatening emergency, suicidal or homicidal thoughts you must seek medical attention immediately by calling 911 or calling your MD immediately  if symptoms less severe.   You must read complete instructions/literature along with all the possible adverse reactions/side effects for all the Medicines you take and that have been prescribed to you. Take any new Medicines after you have completely understood and accpet all the possible adverse reactions/side effects.    Do not drive when taking Pain medications or sleeping medications (Benzodaizepines)   Do not take more than prescribed Pain, Sleep and Anxiety Medications. It is not advisable to combine anxiety,sleep and pain medications without talking with your primary care practitioner   Special Instructions: If you have smoked or chewed Tobacco  in the last 2 yrs please stop smoking, stop any regular Alcohol  and or any  Recreational drug use.   Wear Seat belts while driving.   Please note: You were cared for by a hospitalist during your hospital stay. Once you are discharged, your primary care physician will handle any further medical issues. Please note that NO REFILLS for any discharge medications will be authorized once you are discharged, as it is imperative that you return to your primary care physician (or establish a relationship with a primary care physician if you do not have one) for your post hospital discharge needs so that they can reassess your need for medications and monitor your lab values.  Time coordinating discharge: 35 minutes  SIGNED:  Marzetta Board, MD, PhD 12/16/2019, 10:18 AM

## 2019-12-16 NOTE — TOC Transition Note (Signed)
Transition of Care [facility]) - CM/SW Discharge Note   Patient Details  Name: Lori [name] MRN: [id number] Date of Birth: September 27, 1918  [facility] CM/SW Contact:  Bary Castilla, LCSW Phone Number: [phone] 12/16/2019, 11:00 AM   Clinical Narrative:     Patient will DC to:?[facility] date:?12/16/19 Family notified:?[name] Transport by: Corey Harold   Per MD patient ready for DC to [facility] RN, patient, patient's family, and facility notified of DC. Discharge Summary sent to facility. RN given number for report [phone] room 117b. DC packet on chart. Ambulance transport requested for patient.   CSW signing off.   [place], [place] [phone]   Final next level of care: Skilled Nursing Facility Barriers to Discharge: No Barriers Identified   Patient Goals and CMS Choice   CMS Medicare.gov Compare Post Acute Care list provided to:: Patient Represenative (must comment) [name]) Choice offered to / list presented to : Adult Children  Discharge Placement              Patient chooses bed at: [facility] Patient to be transferred to facility by: [place] Name of family member notified: [name] Patient and family notified of of transfer: 12/16/19  Discharge Plan and Services     Post Acute Care Choice: [place]                               Social Determinants of Health (SDOH) Interventions     Readmission Risk Interventions No flowsheet data found.

## 2019-12-16 NOTE — Progress Notes (Signed)
Attempted to call [facility] 3 times to give report.  First phone call the phone rang for 2 minutes with no answer.  Second phone call phone rang for 3 minutes with no answer.  Third phone call secretary answered then transferred me to the nurses station and the phone rang there for 5 minutes with no answer.

## 2019-12-18 DIAGNOSIS — R0989 Other specified symptoms and signs involving the circulatory and respiratory systems: Secondary | ICD-10-CM | POA: Diagnosis not present

## 2019-12-18 DIAGNOSIS — R627 Adult failure to thrive: Secondary | ICD-10-CM | POA: Diagnosis not present

## 2019-12-18 DIAGNOSIS — R451 Restlessness and agitation: Secondary | ICD-10-CM | POA: Diagnosis not present

## 2019-12-18 DIAGNOSIS — R05 Cough: Secondary | ICD-10-CM | POA: Diagnosis not present

## 2019-12-18 DIAGNOSIS — F411 Generalized anxiety disorder: Secondary | ICD-10-CM | POA: Diagnosis not present

## 2019-12-18 DIAGNOSIS — R54 Age-related physical debility: Secondary | ICD-10-CM | POA: Diagnosis not present

## 2019-12-18 DIAGNOSIS — J189 Pneumonia, unspecified organism: Secondary | ICD-10-CM | POA: Diagnosis not present

## 2019-12-19 DIAGNOSIS — R451 Restlessness and agitation: Secondary | ICD-10-CM | POA: Diagnosis not present

## 2019-12-19 DIAGNOSIS — R627 Adult failure to thrive: Secondary | ICD-10-CM | POA: Diagnosis not present

## 2019-12-19 DIAGNOSIS — R54 Age-related physical debility: Secondary | ICD-10-CM | POA: Diagnosis not present

## 2019-12-19 DIAGNOSIS — M6281 Muscle weakness (generalized): Secondary | ICD-10-CM | POA: Diagnosis not present

## 2019-12-19 DIAGNOSIS — R410 Disorientation, unspecified: Secondary | ICD-10-CM | POA: Diagnosis not present

## 2019-12-19 DIAGNOSIS — F411 Generalized anxiety disorder: Secondary | ICD-10-CM | POA: Diagnosis not present

## 2019-12-26 DIAGNOSIS — I872 Venous insufficiency (chronic) (peripheral): Secondary | ICD-10-CM | POA: Diagnosis not present

## 2019-12-26 DIAGNOSIS — R5383 Other fatigue: Secondary | ICD-10-CM | POA: Diagnosis not present

## 2019-12-26 DIAGNOSIS — L89150 Pressure ulcer of sacral region, unstageable: Secondary | ICD-10-CM | POA: Diagnosis not present

## 2019-12-26 DIAGNOSIS — Z8701 Personal history of pneumonia (recurrent): Secondary | ICD-10-CM | POA: Diagnosis not present

## 2019-12-29 ENCOUNTER — Other Ambulatory Visit: Payer: Self-pay

## 2019-12-29 ENCOUNTER — Non-Acute Institutional Stay: Payer: Medicare Other

## 2019-12-29 VITALS — BP 110/70 | HR 76 | Resp 18 | Wt 168.0 lb

## 2019-12-29 DIAGNOSIS — Z515 Encounter for palliative care: Secondary | ICD-10-CM

## 2020-01-01 DIAGNOSIS — R627 Adult failure to thrive: Secondary | ICD-10-CM | POA: Diagnosis not present

## 2020-01-01 DIAGNOSIS — Z7901 Long term (current) use of anticoagulants: Secondary | ICD-10-CM | POA: Diagnosis not present

## 2020-01-01 DIAGNOSIS — I5032 Chronic diastolic (congestive) heart failure: Secondary | ICD-10-CM | POA: Diagnosis not present

## 2020-01-01 DIAGNOSIS — I480 Paroxysmal atrial fibrillation: Secondary | ICD-10-CM | POA: Diagnosis not present

## 2020-01-01 DIAGNOSIS — R54 Age-related physical debility: Secondary | ICD-10-CM | POA: Diagnosis not present

## 2020-01-02 DIAGNOSIS — F05 Delirium due to known physiological condition: Secondary | ICD-10-CM | POA: Diagnosis not present

## 2020-01-02 DIAGNOSIS — R627 Adult failure to thrive: Secondary | ICD-10-CM | POA: Diagnosis not present

## 2020-01-02 DIAGNOSIS — R5383 Other fatigue: Secondary | ICD-10-CM | POA: Diagnosis not present

## 2020-01-02 DIAGNOSIS — R410 Disorientation, unspecified: Secondary | ICD-10-CM | POA: Diagnosis not present

## 2020-01-02 DIAGNOSIS — R0602 Shortness of breath: Secondary | ICD-10-CM | POA: Diagnosis not present

## 2020-01-02 DIAGNOSIS — R0989 Other specified symptoms and signs involving the circulatory and respiratory systems: Secondary | ICD-10-CM | POA: Diagnosis not present

## 2020-01-02 DIAGNOSIS — R05 Cough: Secondary | ICD-10-CM | POA: Diagnosis not present

## 2020-01-03 DIAGNOSIS — I5032 Chronic diastolic (congestive) heart failure: Secondary | ICD-10-CM | POA: Diagnosis not present

## 2020-01-03 DIAGNOSIS — R410 Disorientation, unspecified: Secondary | ICD-10-CM | POA: Diagnosis not present

## 2020-01-03 DIAGNOSIS — R0989 Other specified symptoms and signs involving the circulatory and respiratory systems: Secondary | ICD-10-CM | POA: Diagnosis not present

## 2020-01-03 DIAGNOSIS — R05 Cough: Secondary | ICD-10-CM | POA: Diagnosis not present

## 2020-01-03 DIAGNOSIS — N184 Chronic kidney disease, stage 4 (severe): Secondary | ICD-10-CM | POA: Diagnosis not present

## 2020-01-04 DIAGNOSIS — K2980 Duodenitis without bleeding: Secondary | ICD-10-CM | POA: Diagnosis not present

## 2020-01-04 DIAGNOSIS — I5033 Acute on chronic diastolic (congestive) heart failure: Secondary | ICD-10-CM | POA: Diagnosis not present

## 2020-01-04 DIAGNOSIS — I272 Pulmonary hypertension, unspecified: Secondary | ICD-10-CM | POA: Diagnosis not present

## 2020-01-04 DIAGNOSIS — I480 Paroxysmal atrial fibrillation: Secondary | ICD-10-CM | POA: Diagnosis not present

## 2020-01-04 DIAGNOSIS — R54 Age-related physical debility: Secondary | ICD-10-CM | POA: Diagnosis not present

## 2020-01-04 DIAGNOSIS — N184 Chronic kidney disease, stage 4 (severe): Secondary | ICD-10-CM | POA: Diagnosis not present

## 2020-01-04 DIAGNOSIS — Z872 Personal history of diseases of the skin and subcutaneous tissue: Secondary | ICD-10-CM | POA: Diagnosis not present

## 2020-01-04 DIAGNOSIS — Z993 Dependence on wheelchair: Secondary | ICD-10-CM | POA: Diagnosis not present

## 2020-01-04 DIAGNOSIS — I5032 Chronic diastolic (congestive) heart failure: Secondary | ICD-10-CM | POA: Diagnosis not present

## 2020-01-04 DIAGNOSIS — K219 Gastro-esophageal reflux disease without esophagitis: Secondary | ICD-10-CM | POA: Diagnosis not present

## 2020-01-04 DIAGNOSIS — H548 Legal blindness, as defined in USA: Secondary | ICD-10-CM | POA: Diagnosis not present

## 2020-01-04 DIAGNOSIS — D631 Anemia in chronic kidney disease: Secondary | ICD-10-CM | POA: Diagnosis not present

## 2020-01-04 DIAGNOSIS — I872 Venous insufficiency (chronic) (peripheral): Secondary | ICD-10-CM | POA: Diagnosis not present

## 2020-01-04 DIAGNOSIS — J302 Other seasonal allergic rhinitis: Secondary | ICD-10-CM | POA: Diagnosis not present

## 2020-01-04 DIAGNOSIS — Z6830 Body mass index (BMI) 30.0-30.9, adult: Secondary | ICD-10-CM | POA: Diagnosis not present

## 2020-01-04 DIAGNOSIS — I714 Abdominal aortic aneurysm, without rupture: Secondary | ICD-10-CM | POA: Diagnosis not present

## 2020-01-04 DIAGNOSIS — R627 Adult failure to thrive: Secondary | ICD-10-CM | POA: Diagnosis not present

## 2020-01-04 DIAGNOSIS — I130 Hypertensive heart and chronic kidney disease with heart failure and stage 1 through stage 4 chronic kidney disease, or unspecified chronic kidney disease: Secondary | ICD-10-CM | POA: Diagnosis not present

## 2020-01-04 DIAGNOSIS — F0390 Unspecified dementia without behavioral disturbance: Secondary | ICD-10-CM | POA: Diagnosis not present

## 2020-01-04 DIAGNOSIS — Z7901 Long term (current) use of anticoagulants: Secondary | ICD-10-CM | POA: Diagnosis not present

## 2020-01-04 DIAGNOSIS — I4891 Unspecified atrial fibrillation: Secondary | ICD-10-CM | POA: Diagnosis not present

## 2020-01-04 DIAGNOSIS — J45909 Unspecified asthma, uncomplicated: Secondary | ICD-10-CM | POA: Diagnosis not present

## 2020-01-04 DIAGNOSIS — H9190 Unspecified hearing loss, unspecified ear: Secondary | ICD-10-CM | POA: Diagnosis not present

## 2020-01-04 NOTE — Progress Notes (Signed)
PATIENT NAME: GWYN HIERONYMUS DOB: 10-21-1918 MRN: [id number]  PRIMARY CARE PROVIDER: Leanna Battles, MD  RESPONSIBLE PARTY:  Acct ID - Guarantor Home Phone Work Phone Relationship Acct Type  [id number] Darryl [id number]  Self P/F     [address], [address] [place], [place] [address]    PLAN OF CARE and INTERVENTIONS:               1.  GOALS OF CARE/ ADVANCE CARE PLANNING: Patient is a DNR, Goal is for patient to have good quality of life and remain pain-free               2.  PATIENT/CAREGIVER EDUCATION:  Education provided regarding palliative care services.fall prevention, and prevention of skin breakdown.                              3.  DISEASE STATUS: RN Palliative care visit. Met with patient and with her caregiver,Charlene, in patient's room at [facility]. Patient is awake and aware, out of bed, sitting in her w/c. Patient is engaging with conversation and able to answer questions. Patient shared that she enjoyed traveling and learning about the different cultures. Past Medical History includes but not limited to A-fib, HTN, Asthma, CKD Stage IV, diastolic heart failure and dementia. Recent hospitalization and was D/C to facility for rehab. Patient noted to be [facility] and poor vision.    HISTORY OF PRESENT ILLNESS:  This is a 86 year old female who currently resides at [facility].   CODE STATUS: DNR  ADVANCED DIRECTIVES: Yes MOST FORM: No PPS: 30%    PHYSICAL EXAM:   VITALS: Today's Vitals   12/29/19 1359 01/04/20 1400  BP: 110/70 110/70  Pulse: 76 76  Resp: 18   SpO2: 97% 97%  Weight: 168 lb (76.2 kg)   PainSc: 0-No pain     LUNGS: CTA, diminished.  CARDIAC: COR RRR EXTREMITIES:  1+ pitting edema SKIN: Hx of Stage 2 wound on left side of buttocks- resolved. No areas of concerns verbalized. NEURO: Alert and oriented x4. Patient does experience some forgetfulness. Positive for weakness,       Cornelius Moras, RN

## 2020-01-05 DIAGNOSIS — I714 Abdominal aortic aneurysm, without rupture: Secondary | ICD-10-CM | POA: Diagnosis not present

## 2020-01-05 DIAGNOSIS — K2980 Duodenitis without bleeding: Secondary | ICD-10-CM | POA: Diagnosis not present

## 2020-01-05 DIAGNOSIS — D631 Anemia in chronic kidney disease: Secondary | ICD-10-CM | POA: Diagnosis not present

## 2020-01-05 DIAGNOSIS — R54 Age-related physical debility: Secondary | ICD-10-CM | POA: Diagnosis not present

## 2020-01-05 DIAGNOSIS — R5383 Other fatigue: Secondary | ICD-10-CM | POA: Diagnosis not present

## 2020-01-05 DIAGNOSIS — I5032 Chronic diastolic (congestive) heart failure: Secondary | ICD-10-CM | POA: Diagnosis not present

## 2020-01-05 DIAGNOSIS — N184 Chronic kidney disease, stage 4 (severe): Secondary | ICD-10-CM | POA: Diagnosis not present

## 2020-01-05 DIAGNOSIS — I130 Hypertensive heart and chronic kidney disease with heart failure and stage 1 through stage 4 chronic kidney disease, or unspecified chronic kidney disease: Secondary | ICD-10-CM | POA: Diagnosis not present

## 2020-01-05 DIAGNOSIS — I480 Paroxysmal atrial fibrillation: Secondary | ICD-10-CM | POA: Diagnosis not present

## 2020-01-05 DIAGNOSIS — R627 Adult failure to thrive: Secondary | ICD-10-CM | POA: Diagnosis not present

## 2020-01-05 DIAGNOSIS — I5033 Acute on chronic diastolic (congestive) heart failure: Secondary | ICD-10-CM | POA: Diagnosis not present

## 2020-01-05 DIAGNOSIS — F05 Delirium due to known physiological condition: Secondary | ICD-10-CM | POA: Diagnosis not present

## 2020-01-09 DIAGNOSIS — I5033 Acute on chronic diastolic (congestive) heart failure: Secondary | ICD-10-CM | POA: Diagnosis not present

## 2020-01-09 DIAGNOSIS — D631 Anemia in chronic kidney disease: Secondary | ICD-10-CM | POA: Diagnosis not present

## 2020-01-09 DIAGNOSIS — I714 Abdominal aortic aneurysm, without rupture: Secondary | ICD-10-CM | POA: Diagnosis not present

## 2020-01-09 DIAGNOSIS — I130 Hypertensive heart and chronic kidney disease with heart failure and stage 1 through stage 4 chronic kidney disease, or unspecified chronic kidney disease: Secondary | ICD-10-CM | POA: Diagnosis not present

## 2020-01-09 DIAGNOSIS — N184 Chronic kidney disease, stage 4 (severe): Secondary | ICD-10-CM | POA: Diagnosis not present

## 2020-01-09 DIAGNOSIS — K2980 Duodenitis without bleeding: Secondary | ICD-10-CM | POA: Diagnosis not present

## 2020-01-10 DIAGNOSIS — H9193 Unspecified hearing loss, bilateral: Secondary | ICD-10-CM | POA: Diagnosis not present

## 2020-01-10 DIAGNOSIS — H6123 Impacted cerumen, bilateral: Secondary | ICD-10-CM | POA: Diagnosis not present

## 2020-01-10 DIAGNOSIS — K649 Unspecified hemorrhoids: Secondary | ICD-10-CM | POA: Diagnosis not present

## 2020-01-10 DIAGNOSIS — R410 Disorientation, unspecified: Secondary | ICD-10-CM | POA: Diagnosis not present

## 2020-01-11 DIAGNOSIS — H6123 Impacted cerumen, bilateral: Secondary | ICD-10-CM | POA: Diagnosis not present

## 2020-01-11 DIAGNOSIS — H9193 Unspecified hearing loss, bilateral: Secondary | ICD-10-CM | POA: Diagnosis not present

## 2020-01-11 DIAGNOSIS — R0989 Other specified symptoms and signs involving the circulatory and respiratory systems: Secondary | ICD-10-CM | POA: Diagnosis not present

## 2020-01-12 DIAGNOSIS — I5033 Acute on chronic diastolic (congestive) heart failure: Secondary | ICD-10-CM | POA: Diagnosis not present

## 2020-01-12 DIAGNOSIS — N184 Chronic kidney disease, stage 4 (severe): Secondary | ICD-10-CM | POA: Diagnosis not present

## 2020-01-12 DIAGNOSIS — K2980 Duodenitis without bleeding: Secondary | ICD-10-CM | POA: Diagnosis not present

## 2020-01-12 DIAGNOSIS — I130 Hypertensive heart and chronic kidney disease with heart failure and stage 1 through stage 4 chronic kidney disease, or unspecified chronic kidney disease: Secondary | ICD-10-CM | POA: Diagnosis not present

## 2020-01-12 DIAGNOSIS — I714 Abdominal aortic aneurysm, without rupture: Secondary | ICD-10-CM | POA: Diagnosis not present

## 2020-01-12 DIAGNOSIS — D631 Anemia in chronic kidney disease: Secondary | ICD-10-CM | POA: Diagnosis not present

## 2020-01-14 DIAGNOSIS — R82998 Other abnormal findings in urine: Secondary | ICD-10-CM | POA: Diagnosis not present

## 2020-01-16 DIAGNOSIS — K2980 Duodenitis without bleeding: Secondary | ICD-10-CM | POA: Diagnosis not present

## 2020-01-16 DIAGNOSIS — N184 Chronic kidney disease, stage 4 (severe): Secondary | ICD-10-CM | POA: Diagnosis not present

## 2020-01-16 DIAGNOSIS — I714 Abdominal aortic aneurysm, without rupture: Secondary | ICD-10-CM | POA: Diagnosis not present

## 2020-01-16 DIAGNOSIS — D631 Anemia in chronic kidney disease: Secondary | ICD-10-CM | POA: Diagnosis not present

## 2020-01-16 DIAGNOSIS — I5033 Acute on chronic diastolic (congestive) heart failure: Secondary | ICD-10-CM | POA: Diagnosis not present

## 2020-01-16 DIAGNOSIS — I130 Hypertensive heart and chronic kidney disease with heart failure and stage 1 through stage 4 chronic kidney disease, or unspecified chronic kidney disease: Secondary | ICD-10-CM | POA: Diagnosis not present

## 2020-01-18 DIAGNOSIS — N390 Urinary tract infection, site not specified: Secondary | ICD-10-CM | POA: Diagnosis not present

## 2020-01-18 DIAGNOSIS — I130 Hypertensive heart and chronic kidney disease with heart failure and stage 1 through stage 4 chronic kidney disease, or unspecified chronic kidney disease: Secondary | ICD-10-CM | POA: Diagnosis not present

## 2020-01-18 DIAGNOSIS — F05 Delirium due to known physiological condition: Secondary | ICD-10-CM | POA: Diagnosis not present

## 2020-01-18 DIAGNOSIS — Z87440 Personal history of urinary (tract) infections: Secondary | ICD-10-CM | POA: Diagnosis not present

## 2020-01-18 DIAGNOSIS — K2980 Duodenitis without bleeding: Secondary | ICD-10-CM | POA: Diagnosis not present

## 2020-01-18 DIAGNOSIS — I714 Abdominal aortic aneurysm, without rupture: Secondary | ICD-10-CM | POA: Diagnosis not present

## 2020-01-18 DIAGNOSIS — N184 Chronic kidney disease, stage 4 (severe): Secondary | ICD-10-CM | POA: Diagnosis not present

## 2020-01-18 DIAGNOSIS — I5033 Acute on chronic diastolic (congestive) heart failure: Secondary | ICD-10-CM | POA: Diagnosis not present

## 2020-01-18 DIAGNOSIS — D631 Anemia in chronic kidney disease: Secondary | ICD-10-CM | POA: Diagnosis not present

## 2020-01-19 DIAGNOSIS — F064 Anxiety disorder due to known physiological condition: Secondary | ICD-10-CM | POA: Diagnosis not present

## 2020-01-19 DIAGNOSIS — K2980 Duodenitis without bleeding: Secondary | ICD-10-CM | POA: Diagnosis not present

## 2020-01-19 DIAGNOSIS — N184 Chronic kidney disease, stage 4 (severe): Secondary | ICD-10-CM | POA: Diagnosis not present

## 2020-01-19 DIAGNOSIS — D631 Anemia in chronic kidney disease: Secondary | ICD-10-CM | POA: Diagnosis not present

## 2020-01-19 DIAGNOSIS — I714 Abdominal aortic aneurysm, without rupture: Secondary | ICD-10-CM | POA: Diagnosis not present

## 2020-01-19 DIAGNOSIS — I5033 Acute on chronic diastolic (congestive) heart failure: Secondary | ICD-10-CM | POA: Diagnosis not present

## 2020-01-19 DIAGNOSIS — I130 Hypertensive heart and chronic kidney disease with heart failure and stage 1 through stage 4 chronic kidney disease, or unspecified chronic kidney disease: Secondary | ICD-10-CM | POA: Diagnosis not present

## 2020-01-19 DIAGNOSIS — F0390 Unspecified dementia without behavioral disturbance: Secondary | ICD-10-CM | POA: Diagnosis not present

## 2020-01-22 DIAGNOSIS — D631 Anemia in chronic kidney disease: Secondary | ICD-10-CM | POA: Diagnosis not present

## 2020-01-22 DIAGNOSIS — I714 Abdominal aortic aneurysm, without rupture: Secondary | ICD-10-CM | POA: Diagnosis not present

## 2020-01-22 DIAGNOSIS — G3184 Mild cognitive impairment, so stated: Secondary | ICD-10-CM | POA: Diagnosis not present

## 2020-01-22 DIAGNOSIS — I5033 Acute on chronic diastolic (congestive) heart failure: Secondary | ICD-10-CM | POA: Diagnosis not present

## 2020-01-22 DIAGNOSIS — K2980 Duodenitis without bleeding: Secondary | ICD-10-CM | POA: Diagnosis not present

## 2020-01-22 DIAGNOSIS — N184 Chronic kidney disease, stage 4 (severe): Secondary | ICD-10-CM | POA: Diagnosis not present

## 2020-01-22 DIAGNOSIS — F4320 Adjustment disorder, unspecified: Secondary | ICD-10-CM | POA: Diagnosis not present

## 2020-01-22 DIAGNOSIS — I130 Hypertensive heart and chronic kidney disease with heart failure and stage 1 through stage 4 chronic kidney disease, or unspecified chronic kidney disease: Secondary | ICD-10-CM | POA: Diagnosis not present

## 2020-01-24 DIAGNOSIS — K2980 Duodenitis without bleeding: Secondary | ICD-10-CM | POA: Diagnosis not present

## 2020-01-24 DIAGNOSIS — K625 Hemorrhage of anus and rectum: Secondary | ICD-10-CM | POA: Diagnosis not present

## 2020-01-24 DIAGNOSIS — K5909 Other constipation: Secondary | ICD-10-CM | POA: Diagnosis not present

## 2020-01-24 DIAGNOSIS — I5033 Acute on chronic diastolic (congestive) heart failure: Secondary | ICD-10-CM | POA: Diagnosis not present

## 2020-01-24 DIAGNOSIS — N184 Chronic kidney disease, stage 4 (severe): Secondary | ICD-10-CM | POA: Diagnosis not present

## 2020-01-24 DIAGNOSIS — K649 Unspecified hemorrhoids: Secondary | ICD-10-CM | POA: Diagnosis not present

## 2020-01-24 DIAGNOSIS — D631 Anemia in chronic kidney disease: Secondary | ICD-10-CM | POA: Diagnosis not present

## 2020-01-24 DIAGNOSIS — I714 Abdominal aortic aneurysm, without rupture: Secondary | ICD-10-CM | POA: Diagnosis not present

## 2020-01-24 DIAGNOSIS — I130 Hypertensive heart and chronic kidney disease with heart failure and stage 1 through stage 4 chronic kidney disease, or unspecified chronic kidney disease: Secondary | ICD-10-CM | POA: Diagnosis not present

## 2020-01-29 DIAGNOSIS — I130 Hypertensive heart and chronic kidney disease with heart failure and stage 1 through stage 4 chronic kidney disease, or unspecified chronic kidney disease: Secondary | ICD-10-CM | POA: Diagnosis not present

## 2020-01-29 DIAGNOSIS — I5033 Acute on chronic diastolic (congestive) heart failure: Secondary | ICD-10-CM | POA: Diagnosis not present

## 2020-01-29 DIAGNOSIS — D631 Anemia in chronic kidney disease: Secondary | ICD-10-CM | POA: Diagnosis not present

## 2020-01-29 DIAGNOSIS — I714 Abdominal aortic aneurysm, without rupture: Secondary | ICD-10-CM | POA: Diagnosis not present

## 2020-01-29 DIAGNOSIS — K2980 Duodenitis without bleeding: Secondary | ICD-10-CM | POA: Diagnosis not present

## 2020-01-29 DIAGNOSIS — N184 Chronic kidney disease, stage 4 (severe): Secondary | ICD-10-CM | POA: Diagnosis not present

## 2020-01-30 DIAGNOSIS — I130 Hypertensive heart and chronic kidney disease with heart failure and stage 1 through stage 4 chronic kidney disease, or unspecified chronic kidney disease: Secondary | ICD-10-CM | POA: Diagnosis not present

## 2020-01-30 DIAGNOSIS — I714 Abdominal aortic aneurysm, without rupture: Secondary | ICD-10-CM | POA: Diagnosis not present

## 2020-01-30 DIAGNOSIS — K2980 Duodenitis without bleeding: Secondary | ICD-10-CM | POA: Diagnosis not present

## 2020-01-30 DIAGNOSIS — D631 Anemia in chronic kidney disease: Secondary | ICD-10-CM | POA: Diagnosis not present

## 2020-01-30 DIAGNOSIS — I5033 Acute on chronic diastolic (congestive) heart failure: Secondary | ICD-10-CM | POA: Diagnosis not present

## 2020-01-30 DIAGNOSIS — N184 Chronic kidney disease, stage 4 (severe): Secondary | ICD-10-CM | POA: Diagnosis not present

## 2020-01-31 DIAGNOSIS — D631 Anemia in chronic kidney disease: Secondary | ICD-10-CM | POA: Diagnosis not present

## 2020-01-31 DIAGNOSIS — N184 Chronic kidney disease, stage 4 (severe): Secondary | ICD-10-CM | POA: Diagnosis not present

## 2020-01-31 DIAGNOSIS — I5033 Acute on chronic diastolic (congestive) heart failure: Secondary | ICD-10-CM | POA: Diagnosis not present

## 2020-01-31 DIAGNOSIS — R2681 Unsteadiness on feet: Secondary | ICD-10-CM | POA: Diagnosis not present

## 2020-01-31 DIAGNOSIS — M6281 Muscle weakness (generalized): Secondary | ICD-10-CM | POA: Diagnosis not present

## 2020-01-31 DIAGNOSIS — K2980 Duodenitis without bleeding: Secondary | ICD-10-CM | POA: Diagnosis not present

## 2020-01-31 DIAGNOSIS — I714 Abdominal aortic aneurysm, without rupture: Secondary | ICD-10-CM | POA: Diagnosis not present

## 2020-01-31 DIAGNOSIS — R0989 Other specified symptoms and signs involving the circulatory and respiratory systems: Secondary | ICD-10-CM | POA: Diagnosis not present

## 2020-01-31 DIAGNOSIS — I130 Hypertensive heart and chronic kidney disease with heart failure and stage 1 through stage 4 chronic kidney disease, or unspecified chronic kidney disease: Secondary | ICD-10-CM | POA: Diagnosis not present

## 2020-02-04 DIAGNOSIS — J45909 Unspecified asthma, uncomplicated: Secondary | ICD-10-CM | POA: Diagnosis not present

## 2020-02-04 DIAGNOSIS — Z6830 Body mass index (BMI) 30.0-30.9, adult: Secondary | ICD-10-CM | POA: Diagnosis not present

## 2020-02-04 DIAGNOSIS — J302 Other seasonal allergic rhinitis: Secondary | ICD-10-CM | POA: Diagnosis not present

## 2020-02-04 DIAGNOSIS — K2980 Duodenitis without bleeding: Secondary | ICD-10-CM | POA: Diagnosis not present

## 2020-02-04 DIAGNOSIS — N184 Chronic kidney disease, stage 4 (severe): Secondary | ICD-10-CM | POA: Diagnosis not present

## 2020-02-04 DIAGNOSIS — I130 Hypertensive heart and chronic kidney disease with heart failure and stage 1 through stage 4 chronic kidney disease, or unspecified chronic kidney disease: Secondary | ICD-10-CM | POA: Diagnosis not present

## 2020-02-04 DIAGNOSIS — I272 Pulmonary hypertension, unspecified: Secondary | ICD-10-CM | POA: Diagnosis not present

## 2020-02-04 DIAGNOSIS — I5033 Acute on chronic diastolic (congestive) heart failure: Secondary | ICD-10-CM | POA: Diagnosis not present

## 2020-02-04 DIAGNOSIS — H548 Legal blindness, as defined in USA: Secondary | ICD-10-CM | POA: Diagnosis not present

## 2020-02-04 DIAGNOSIS — K219 Gastro-esophageal reflux disease without esophagitis: Secondary | ICD-10-CM | POA: Diagnosis not present

## 2020-02-04 DIAGNOSIS — H9190 Unspecified hearing loss, unspecified ear: Secondary | ICD-10-CM | POA: Diagnosis not present

## 2020-02-04 DIAGNOSIS — F0390 Unspecified dementia without behavioral disturbance: Secondary | ICD-10-CM | POA: Diagnosis not present

## 2020-02-04 DIAGNOSIS — I4891 Unspecified atrial fibrillation: Secondary | ICD-10-CM | POA: Diagnosis not present

## 2020-02-04 DIAGNOSIS — D631 Anemia in chronic kidney disease: Secondary | ICD-10-CM | POA: Diagnosis not present

## 2020-02-04 DIAGNOSIS — Z993 Dependence on wheelchair: Secondary | ICD-10-CM | POA: Diagnosis not present

## 2020-02-04 DIAGNOSIS — Z872 Personal history of diseases of the skin and subcutaneous tissue: Secondary | ICD-10-CM | POA: Diagnosis not present

## 2020-02-04 DIAGNOSIS — I872 Venous insufficiency (chronic) (peripheral): Secondary | ICD-10-CM | POA: Diagnosis not present

## 2020-02-04 DIAGNOSIS — I714 Abdominal aortic aneurysm, without rupture: Secondary | ICD-10-CM | POA: Diagnosis not present

## 2020-02-05 DIAGNOSIS — I130 Hypertensive heart and chronic kidney disease with heart failure and stage 1 through stage 4 chronic kidney disease, or unspecified chronic kidney disease: Secondary | ICD-10-CM | POA: Diagnosis not present

## 2020-02-05 DIAGNOSIS — I5033 Acute on chronic diastolic (congestive) heart failure: Secondary | ICD-10-CM | POA: Diagnosis not present

## 2020-02-05 DIAGNOSIS — N184 Chronic kidney disease, stage 4 (severe): Secondary | ICD-10-CM | POA: Diagnosis not present

## 2020-02-05 DIAGNOSIS — K2980 Duodenitis without bleeding: Secondary | ICD-10-CM | POA: Diagnosis not present

## 2020-02-05 DIAGNOSIS — I714 Abdominal aortic aneurysm, without rupture: Secondary | ICD-10-CM | POA: Diagnosis not present

## 2020-02-05 DIAGNOSIS — D631 Anemia in chronic kidney disease: Secondary | ICD-10-CM | POA: Diagnosis not present

## 2020-02-06 DIAGNOSIS — I5033 Acute on chronic diastolic (congestive) heart failure: Secondary | ICD-10-CM | POA: Diagnosis not present

## 2020-02-06 DIAGNOSIS — R54 Age-related physical debility: Secondary | ICD-10-CM | POA: Diagnosis not present

## 2020-02-06 DIAGNOSIS — I714 Abdominal aortic aneurysm, without rupture: Secondary | ICD-10-CM | POA: Diagnosis not present

## 2020-02-06 DIAGNOSIS — N184 Chronic kidney disease, stage 4 (severe): Secondary | ICD-10-CM | POA: Diagnosis not present

## 2020-02-06 DIAGNOSIS — I5032 Chronic diastolic (congestive) heart failure: Secondary | ICD-10-CM | POA: Diagnosis not present

## 2020-02-06 DIAGNOSIS — I130 Hypertensive heart and chronic kidney disease with heart failure and stage 1 through stage 4 chronic kidney disease, or unspecified chronic kidney disease: Secondary | ICD-10-CM | POA: Diagnosis not present

## 2020-02-06 DIAGNOSIS — M6281 Muscle weakness (generalized): Secondary | ICD-10-CM | POA: Diagnosis not present

## 2020-02-06 DIAGNOSIS — R5381 Other malaise: Secondary | ICD-10-CM | POA: Diagnosis not present

## 2020-02-06 DIAGNOSIS — D631 Anemia in chronic kidney disease: Secondary | ICD-10-CM | POA: Diagnosis not present

## 2020-02-06 DIAGNOSIS — K2980 Duodenitis without bleeding: Secondary | ICD-10-CM | POA: Diagnosis not present

## 2020-02-06 DIAGNOSIS — I480 Paroxysmal atrial fibrillation: Secondary | ICD-10-CM | POA: Diagnosis not present

## 2020-02-06 DIAGNOSIS — R627 Adult failure to thrive: Secondary | ICD-10-CM | POA: Diagnosis not present

## 2020-02-07 DIAGNOSIS — I5033 Acute on chronic diastolic (congestive) heart failure: Secondary | ICD-10-CM | POA: Diagnosis not present

## 2020-02-07 DIAGNOSIS — N184 Chronic kidney disease, stage 4 (severe): Secondary | ICD-10-CM | POA: Diagnosis not present

## 2020-02-07 DIAGNOSIS — I714 Abdominal aortic aneurysm, without rupture: Secondary | ICD-10-CM | POA: Diagnosis not present

## 2020-02-07 DIAGNOSIS — K2980 Duodenitis without bleeding: Secondary | ICD-10-CM | POA: Diagnosis not present

## 2020-02-07 DIAGNOSIS — I130 Hypertensive heart and chronic kidney disease with heart failure and stage 1 through stage 4 chronic kidney disease, or unspecified chronic kidney disease: Secondary | ICD-10-CM | POA: Diagnosis not present

## 2020-02-07 DIAGNOSIS — D631 Anemia in chronic kidney disease: Secondary | ICD-10-CM | POA: Diagnosis not present

## 2020-02-09 DIAGNOSIS — R410 Disorientation, unspecified: Secondary | ICD-10-CM | POA: Diagnosis not present

## 2020-02-09 DIAGNOSIS — R5381 Other malaise: Secondary | ICD-10-CM | POA: Diagnosis not present

## 2020-02-09 DIAGNOSIS — R54 Age-related physical debility: Secondary | ICD-10-CM | POA: Diagnosis not present

## 2020-02-09 DIAGNOSIS — G3184 Mild cognitive impairment, so stated: Secondary | ICD-10-CM | POA: Diagnosis not present

## 2020-02-09 DIAGNOSIS — F05 Delirium due to known physiological condition: Secondary | ICD-10-CM | POA: Diagnosis not present

## 2020-02-09 DIAGNOSIS — F411 Generalized anxiety disorder: Secondary | ICD-10-CM | POA: Diagnosis not present

## 2020-02-09 DIAGNOSIS — M6281 Muscle weakness (generalized): Secondary | ICD-10-CM | POA: Diagnosis not present

## 2020-02-12 DIAGNOSIS — G9341 Metabolic encephalopathy: Secondary | ICD-10-CM | POA: Diagnosis not present

## 2020-02-12 DIAGNOSIS — I5032 Chronic diastolic (congestive) heart failure: Secondary | ICD-10-CM | POA: Diagnosis not present

## 2020-02-12 DIAGNOSIS — M6281 Muscle weakness (generalized): Secondary | ICD-10-CM | POA: Diagnosis not present

## 2020-02-12 DIAGNOSIS — R5383 Other fatigue: Secondary | ICD-10-CM | POA: Diagnosis not present

## 2020-02-12 DIAGNOSIS — R0600 Dyspnea, unspecified: Secondary | ICD-10-CM | POA: Diagnosis not present

## 2020-02-13 DIAGNOSIS — F411 Generalized anxiety disorder: Secondary | ICD-10-CM | POA: Diagnosis not present

## 2020-02-13 DIAGNOSIS — F05 Delirium due to known physiological condition: Secondary | ICD-10-CM | POA: Diagnosis not present

## 2020-02-13 DIAGNOSIS — I5032 Chronic diastolic (congestive) heart failure: Secondary | ICD-10-CM | POA: Diagnosis not present

## 2020-02-13 DIAGNOSIS — R5381 Other malaise: Secondary | ICD-10-CM | POA: Diagnosis not present

## 2020-02-13 DIAGNOSIS — I480 Paroxysmal atrial fibrillation: Secondary | ICD-10-CM | POA: Diagnosis not present

## 2020-02-13 DIAGNOSIS — M6281 Muscle weakness (generalized): Secondary | ICD-10-CM | POA: Diagnosis not present

## 2020-02-13 DIAGNOSIS — R627 Adult failure to thrive: Secondary | ICD-10-CM | POA: Diagnosis not present

## 2020-02-13 DIAGNOSIS — G3184 Mild cognitive impairment, so stated: Secondary | ICD-10-CM | POA: Diagnosis not present

## 2020-02-13 DIAGNOSIS — G9341 Metabolic encephalopathy: Secondary | ICD-10-CM | POA: Diagnosis not present

## 2020-02-14 DIAGNOSIS — D631 Anemia in chronic kidney disease: Secondary | ICD-10-CM | POA: Diagnosis not present

## 2020-02-14 DIAGNOSIS — I10 Essential (primary) hypertension: Secondary | ICD-10-CM | POA: Diagnosis not present

## 2020-02-14 DIAGNOSIS — G9341 Metabolic encephalopathy: Secondary | ICD-10-CM | POA: Diagnosis not present

## 2020-02-14 DIAGNOSIS — N184 Chronic kidney disease, stage 4 (severe): Secondary | ICD-10-CM | POA: Diagnosis not present

## 2020-02-14 DIAGNOSIS — I5032 Chronic diastolic (congestive) heart failure: Secondary | ICD-10-CM | POA: Diagnosis not present

## 2020-02-14 DIAGNOSIS — M6281 Muscle weakness (generalized): Secondary | ICD-10-CM | POA: Diagnosis not present

## 2020-02-15 DIAGNOSIS — I5032 Chronic diastolic (congestive) heart failure: Secondary | ICD-10-CM | POA: Diagnosis not present

## 2020-02-15 DIAGNOSIS — G9341 Metabolic encephalopathy: Secondary | ICD-10-CM | POA: Diagnosis not present

## 2020-02-15 DIAGNOSIS — N184 Chronic kidney disease, stage 4 (severe): Secondary | ICD-10-CM | POA: Diagnosis not present

## 2020-02-15 DIAGNOSIS — M6281 Muscle weakness (generalized): Secondary | ICD-10-CM | POA: Diagnosis not present

## 2020-02-15 DIAGNOSIS — D631 Anemia in chronic kidney disease: Secondary | ICD-10-CM | POA: Diagnosis not present

## 2020-02-15 DIAGNOSIS — E559 Vitamin D deficiency, unspecified: Secondary | ICD-10-CM | POA: Diagnosis not present

## 2020-02-16 DIAGNOSIS — R5383 Other fatigue: Secondary | ICD-10-CM | POA: Diagnosis not present

## 2020-02-16 DIAGNOSIS — I5032 Chronic diastolic (congestive) heart failure: Secondary | ICD-10-CM | POA: Diagnosis not present

## 2020-02-16 DIAGNOSIS — G9341 Metabolic encephalopathy: Secondary | ICD-10-CM | POA: Diagnosis not present

## 2020-02-16 DIAGNOSIS — F05 Delirium due to known physiological condition: Secondary | ICD-10-CM | POA: Diagnosis not present

## 2020-02-16 DIAGNOSIS — R627 Adult failure to thrive: Secondary | ICD-10-CM | POA: Diagnosis not present

## 2020-02-16 DIAGNOSIS — R05 Cough: Secondary | ICD-10-CM | POA: Diagnosis not present

## 2020-02-16 DIAGNOSIS — M6281 Muscle weakness (generalized): Secondary | ICD-10-CM | POA: Diagnosis not present

## 2020-02-16 DIAGNOSIS — R54 Age-related physical debility: Secondary | ICD-10-CM | POA: Diagnosis not present

## 2020-02-16 DIAGNOSIS — I517 Cardiomegaly: Secondary | ICD-10-CM | POA: Diagnosis not present

## 2020-02-16 DIAGNOSIS — R0989 Other specified symptoms and signs involving the circulatory and respiratory systems: Secondary | ICD-10-CM | POA: Diagnosis not present

## 2020-02-19 DIAGNOSIS — M6281 Muscle weakness (generalized): Secondary | ICD-10-CM | POA: Diagnosis not present

## 2020-02-19 DIAGNOSIS — I5032 Chronic diastolic (congestive) heart failure: Secondary | ICD-10-CM | POA: Diagnosis not present

## 2020-02-19 DIAGNOSIS — G9341 Metabolic encephalopathy: Secondary | ICD-10-CM | POA: Diagnosis not present

## 2020-02-19 DIAGNOSIS — N184 Chronic kidney disease, stage 4 (severe): Secondary | ICD-10-CM | POA: Diagnosis not present

## 2020-02-20 DIAGNOSIS — G9341 Metabolic encephalopathy: Secondary | ICD-10-CM | POA: Diagnosis not present

## 2020-02-20 DIAGNOSIS — M6281 Muscle weakness (generalized): Secondary | ICD-10-CM | POA: Diagnosis not present

## 2020-02-20 DIAGNOSIS — I5032 Chronic diastolic (congestive) heart failure: Secondary | ICD-10-CM | POA: Diagnosis not present

## 2020-02-21 DIAGNOSIS — G9341 Metabolic encephalopathy: Secondary | ICD-10-CM | POA: Diagnosis not present

## 2020-02-21 DIAGNOSIS — M6281 Muscle weakness (generalized): Secondary | ICD-10-CM | POA: Diagnosis not present

## 2020-02-21 DIAGNOSIS — I5032 Chronic diastolic (congestive) heart failure: Secondary | ICD-10-CM | POA: Diagnosis not present

## 2020-02-22 DIAGNOSIS — G9341 Metabolic encephalopathy: Secondary | ICD-10-CM | POA: Diagnosis not present

## 2020-02-22 DIAGNOSIS — M6281 Muscle weakness (generalized): Secondary | ICD-10-CM | POA: Diagnosis not present

## 2020-02-22 DIAGNOSIS — I5032 Chronic diastolic (congestive) heart failure: Secondary | ICD-10-CM | POA: Diagnosis not present

## 2020-02-23 DIAGNOSIS — M6281 Muscle weakness (generalized): Secondary | ICD-10-CM | POA: Diagnosis not present

## 2020-02-23 DIAGNOSIS — F0390 Unspecified dementia without behavioral disturbance: Secondary | ICD-10-CM | POA: Diagnosis not present

## 2020-02-23 DIAGNOSIS — I5032 Chronic diastolic (congestive) heart failure: Secondary | ICD-10-CM | POA: Diagnosis not present

## 2020-02-23 DIAGNOSIS — F064 Anxiety disorder due to known physiological condition: Secondary | ICD-10-CM | POA: Diagnosis not present

## 2020-02-23 DIAGNOSIS — G9341 Metabolic encephalopathy: Secondary | ICD-10-CM | POA: Diagnosis not present

## 2020-02-26 DIAGNOSIS — R5383 Other fatigue: Secondary | ICD-10-CM | POA: Diagnosis not present

## 2020-02-26 DIAGNOSIS — F05 Delirium due to known physiological condition: Secondary | ICD-10-CM | POA: Diagnosis not present

## 2020-02-26 DIAGNOSIS — R54 Age-related physical debility: Secondary | ICD-10-CM | POA: Diagnosis not present

## 2020-02-26 DIAGNOSIS — R627 Adult failure to thrive: Secondary | ICD-10-CM | POA: Diagnosis not present

## 2020-02-27 DIAGNOSIS — J90 Pleural effusion, not elsewhere classified: Secondary | ICD-10-CM | POA: Diagnosis not present

## 2020-02-28 DIAGNOSIS — N390 Urinary tract infection, site not specified: Secondary | ICD-10-CM | POA: Diagnosis not present

## 2020-02-29 DIAGNOSIS — M6281 Muscle weakness (generalized): Secondary | ICD-10-CM | POA: Diagnosis not present

## 2020-02-29 DIAGNOSIS — R0782 Intercostal pain: Secondary | ICD-10-CM | POA: Diagnosis not present

## 2020-02-29 DIAGNOSIS — W19XXXA Unspecified fall, initial encounter: Secondary | ICD-10-CM | POA: Diagnosis not present

## 2020-03-01 DIAGNOSIS — R82998 Other abnormal findings in urine: Secondary | ICD-10-CM | POA: Diagnosis not present

## 2020-03-06 DIAGNOSIS — F411 Generalized anxiety disorder: Secondary | ICD-10-CM | POA: Diagnosis not present

## 2020-03-06 DIAGNOSIS — F05 Delirium due to known physiological condition: Secondary | ICD-10-CM | POA: Diagnosis not present

## 2020-03-06 DIAGNOSIS — G3184 Mild cognitive impairment, so stated: Secondary | ICD-10-CM | POA: Diagnosis not present

## 2020-03-06 DIAGNOSIS — R627 Adult failure to thrive: Secondary | ICD-10-CM | POA: Diagnosis not present

## 2020-03-07 DIAGNOSIS — R4182 Altered mental status, unspecified: Secondary | ICD-10-CM | POA: Diagnosis not present

## 2020-03-13 DIAGNOSIS — F05 Delirium due to known physiological condition: Secondary | ICD-10-CM | POA: Diagnosis not present

## 2020-03-13 DIAGNOSIS — N390 Urinary tract infection, site not specified: Secondary | ICD-10-CM | POA: Diagnosis not present

## 2020-03-14 DIAGNOSIS — N390 Urinary tract infection, site not specified: Secondary | ICD-10-CM | POA: Diagnosis not present

## 2020-03-14 DIAGNOSIS — R109 Unspecified abdominal pain: Secondary | ICD-10-CM | POA: Diagnosis not present

## 2020-03-14 DIAGNOSIS — R140 Abdominal distension (gaseous): Secondary | ICD-10-CM | POA: Diagnosis not present

## 2020-03-14 DIAGNOSIS — R1084 Generalized abdominal pain: Secondary | ICD-10-CM | POA: Diagnosis not present

## 2020-03-14 DIAGNOSIS — R627 Adult failure to thrive: Secondary | ICD-10-CM | POA: Diagnosis not present

## 2020-03-14 DIAGNOSIS — R54 Age-related physical debility: Secondary | ICD-10-CM | POA: Diagnosis not present

## 2020-03-15 DIAGNOSIS — F0390 Unspecified dementia without behavioral disturbance: Secondary | ICD-10-CM | POA: Diagnosis not present

## 2020-03-15 DIAGNOSIS — D631 Anemia in chronic kidney disease: Secondary | ICD-10-CM | POA: Diagnosis not present

## 2020-03-15 DIAGNOSIS — R140 Abdominal distension (gaseous): Secondary | ICD-10-CM | POA: Diagnosis not present

## 2020-03-15 DIAGNOSIS — K5909 Other constipation: Secondary | ICD-10-CM | POA: Diagnosis not present

## 2020-03-15 DIAGNOSIS — N184 Chronic kidney disease, stage 4 (severe): Secondary | ICD-10-CM | POA: Diagnosis not present

## 2020-03-15 DIAGNOSIS — F064 Anxiety disorder due to known physiological condition: Secondary | ICD-10-CM | POA: Diagnosis not present

## 2020-03-22 DIAGNOSIS — H1031 Unspecified acute conjunctivitis, right eye: Secondary | ICD-10-CM | POA: Diagnosis not present

## 2020-03-22 DIAGNOSIS — I5032 Chronic diastolic (congestive) heart failure: Secondary | ICD-10-CM | POA: Diagnosis not present

## 2020-03-22 DIAGNOSIS — H04123 Dry eye syndrome of bilateral lacrimal glands: Secondary | ICD-10-CM | POA: Diagnosis not present

## 2020-03-22 DIAGNOSIS — M6281 Muscle weakness (generalized): Secondary | ICD-10-CM | POA: Diagnosis not present

## 2020-03-24 DIAGNOSIS — I5032 Chronic diastolic (congestive) heart failure: Secondary | ICD-10-CM | POA: Diagnosis not present

## 2020-03-24 DIAGNOSIS — M6281 Muscle weakness (generalized): Secondary | ICD-10-CM | POA: Diagnosis not present

## 2020-03-25 DIAGNOSIS — I5032 Chronic diastolic (congestive) heart failure: Secondary | ICD-10-CM | POA: Diagnosis not present

## 2020-03-25 DIAGNOSIS — M6281 Muscle weakness (generalized): Secondary | ICD-10-CM | POA: Diagnosis not present

## 2020-03-27 DIAGNOSIS — I5032 Chronic diastolic (congestive) heart failure: Secondary | ICD-10-CM | POA: Diagnosis not present

## 2020-03-27 DIAGNOSIS — M6281 Muscle weakness (generalized): Secondary | ICD-10-CM | POA: Diagnosis not present

## 2020-03-28 DIAGNOSIS — F411 Generalized anxiety disorder: Secondary | ICD-10-CM | POA: Diagnosis not present

## 2020-03-28 DIAGNOSIS — I10 Essential (primary) hypertension: Secondary | ICD-10-CM | POA: Diagnosis not present

## 2020-03-28 DIAGNOSIS — R600 Localized edema: Secondary | ICD-10-CM | POA: Diagnosis not present

## 2020-03-28 DIAGNOSIS — I5032 Chronic diastolic (congestive) heart failure: Secondary | ICD-10-CM | POA: Diagnosis not present

## 2020-03-28 DIAGNOSIS — K219 Gastro-esophageal reflux disease without esophagitis: Secondary | ICD-10-CM | POA: Diagnosis not present

## 2020-03-28 DIAGNOSIS — R296 Repeated falls: Secondary | ICD-10-CM | POA: Diagnosis not present

## 2020-03-28 DIAGNOSIS — E559 Vitamin D deficiency, unspecified: Secondary | ICD-10-CM | POA: Diagnosis not present

## 2020-03-28 DIAGNOSIS — I4891 Unspecified atrial fibrillation: Secondary | ICD-10-CM | POA: Diagnosis not present

## 2020-03-28 DIAGNOSIS — Z6833 Body mass index (BMI) 33.0-33.9, adult: Secondary | ICD-10-CM | POA: Diagnosis not present

## 2020-03-28 DIAGNOSIS — Z87440 Personal history of urinary (tract) infections: Secondary | ICD-10-CM | POA: Diagnosis not present

## 2020-03-28 DIAGNOSIS — M6281 Muscle weakness (generalized): Secondary | ICD-10-CM | POA: Diagnosis not present

## 2020-03-29 DIAGNOSIS — M6281 Muscle weakness (generalized): Secondary | ICD-10-CM | POA: Diagnosis not present

## 2020-03-29 DIAGNOSIS — I5032 Chronic diastolic (congestive) heart failure: Secondary | ICD-10-CM | POA: Diagnosis not present

## 2020-04-01 DIAGNOSIS — M6281 Muscle weakness (generalized): Secondary | ICD-10-CM | POA: Diagnosis not present

## 2020-04-01 DIAGNOSIS — I5032 Chronic diastolic (congestive) heart failure: Secondary | ICD-10-CM | POA: Diagnosis not present

## 2020-04-02 DIAGNOSIS — I5032 Chronic diastolic (congestive) heart failure: Secondary | ICD-10-CM | POA: Diagnosis not present

## 2020-04-02 DIAGNOSIS — M6281 Muscle weakness (generalized): Secondary | ICD-10-CM | POA: Diagnosis not present

## 2020-04-03 DIAGNOSIS — M6281 Muscle weakness (generalized): Secondary | ICD-10-CM | POA: Diagnosis not present

## 2020-04-03 DIAGNOSIS — I5032 Chronic diastolic (congestive) heart failure: Secondary | ICD-10-CM | POA: Diagnosis not present

## 2020-04-04 DIAGNOSIS — M6281 Muscle weakness (generalized): Secondary | ICD-10-CM | POA: Diagnosis not present

## 2020-04-04 DIAGNOSIS — I5032 Chronic diastolic (congestive) heart failure: Secondary | ICD-10-CM | POA: Diagnosis not present

## 2020-04-05 DIAGNOSIS — I5032 Chronic diastolic (congestive) heart failure: Secondary | ICD-10-CM | POA: Diagnosis not present

## 2020-04-05 DIAGNOSIS — M6281 Muscle weakness (generalized): Secondary | ICD-10-CM | POA: Diagnosis not present

## 2020-04-08 DIAGNOSIS — M6281 Muscle weakness (generalized): Secondary | ICD-10-CM | POA: Diagnosis not present

## 2020-04-08 DIAGNOSIS — I5032 Chronic diastolic (congestive) heart failure: Secondary | ICD-10-CM | POA: Diagnosis not present

## 2020-04-09 DIAGNOSIS — I5032 Chronic diastolic (congestive) heart failure: Secondary | ICD-10-CM | POA: Diagnosis not present

## 2020-04-09 DIAGNOSIS — M6281 Muscle weakness (generalized): Secondary | ICD-10-CM | POA: Diagnosis not present

## 2020-04-10 DIAGNOSIS — M6281 Muscle weakness (generalized): Secondary | ICD-10-CM | POA: Diagnosis not present

## 2020-04-10 DIAGNOSIS — I5032 Chronic diastolic (congestive) heart failure: Secondary | ICD-10-CM | POA: Diagnosis not present

## 2020-04-11 DIAGNOSIS — M6281 Muscle weakness (generalized): Secondary | ICD-10-CM | POA: Diagnosis not present

## 2020-04-11 DIAGNOSIS — I5032 Chronic diastolic (congestive) heart failure: Secondary | ICD-10-CM | POA: Diagnosis not present

## 2020-04-12 DIAGNOSIS — F0390 Unspecified dementia without behavioral disturbance: Secondary | ICD-10-CM | POA: Diagnosis not present

## 2020-04-12 DIAGNOSIS — F064 Anxiety disorder due to known physiological condition: Secondary | ICD-10-CM | POA: Diagnosis not present

## 2020-04-12 DIAGNOSIS — I5032 Chronic diastolic (congestive) heart failure: Secondary | ICD-10-CM | POA: Diagnosis not present

## 2020-04-12 DIAGNOSIS — M6281 Muscle weakness (generalized): Secondary | ICD-10-CM | POA: Diagnosis not present

## 2020-04-15 DIAGNOSIS — I5032 Chronic diastolic (congestive) heart failure: Secondary | ICD-10-CM | POA: Diagnosis not present

## 2020-04-15 DIAGNOSIS — M6281 Muscle weakness (generalized): Secondary | ICD-10-CM | POA: Diagnosis not present

## 2020-04-16 DIAGNOSIS — I5032 Chronic diastolic (congestive) heart failure: Secondary | ICD-10-CM | POA: Diagnosis not present

## 2020-04-16 DIAGNOSIS — M6281 Muscle weakness (generalized): Secondary | ICD-10-CM | POA: Diagnosis not present

## 2020-04-17 DIAGNOSIS — M6281 Muscle weakness (generalized): Secondary | ICD-10-CM | POA: Diagnosis not present

## 2020-04-17 DIAGNOSIS — I5032 Chronic diastolic (congestive) heart failure: Secondary | ICD-10-CM | POA: Diagnosis not present

## 2020-04-18 DIAGNOSIS — I5032 Chronic diastolic (congestive) heart failure: Secondary | ICD-10-CM | POA: Diagnosis not present

## 2020-04-18 DIAGNOSIS — L03115 Cellulitis of right lower limb: Secondary | ICD-10-CM | POA: Diagnosis not present

## 2020-04-18 DIAGNOSIS — S81801A Unspecified open wound, right lower leg, initial encounter: Secondary | ICD-10-CM | POA: Diagnosis not present

## 2020-04-18 DIAGNOSIS — M6281 Muscle weakness (generalized): Secondary | ICD-10-CM | POA: Diagnosis not present

## 2020-04-18 DIAGNOSIS — R600 Localized edema: Secondary | ICD-10-CM | POA: Diagnosis not present

## 2020-04-23 DIAGNOSIS — L308 Other specified dermatitis: Secondary | ICD-10-CM | POA: Diagnosis not present

## 2020-04-23 DIAGNOSIS — I739 Peripheral vascular disease, unspecified: Secondary | ICD-10-CM | POA: Diagnosis not present

## 2020-04-25 DIAGNOSIS — I872 Venous insufficiency (chronic) (peripheral): Secondary | ICD-10-CM | POA: Diagnosis not present

## 2020-04-25 DIAGNOSIS — L97319 Non-pressure chronic ulcer of right ankle with unspecified severity: Secondary | ICD-10-CM | POA: Diagnosis not present

## 2020-04-25 DIAGNOSIS — S81801D Unspecified open wound, right lower leg, subsequent encounter: Secondary | ICD-10-CM | POA: Diagnosis not present

## 2020-04-25 DIAGNOSIS — I5032 Chronic diastolic (congestive) heart failure: Secondary | ICD-10-CM | POA: Diagnosis not present

## 2020-04-25 DIAGNOSIS — R600 Localized edema: Secondary | ICD-10-CM | POA: Diagnosis not present

## 2020-04-26 DIAGNOSIS — R600 Localized edema: Secondary | ICD-10-CM | POA: Diagnosis not present

## 2020-04-26 DIAGNOSIS — I872 Venous insufficiency (chronic) (peripheral): Secondary | ICD-10-CM | POA: Diagnosis not present

## 2020-04-26 DIAGNOSIS — L539 Erythematous condition, unspecified: Secondary | ICD-10-CM | POA: Diagnosis not present

## 2020-04-26 DIAGNOSIS — I5032 Chronic diastolic (congestive) heart failure: Secondary | ICD-10-CM | POA: Diagnosis not present

## 2020-04-26 DIAGNOSIS — R2243 Localized swelling, mass and lump, lower limb, bilateral: Secondary | ICD-10-CM | POA: Diagnosis not present

## 2020-04-26 DIAGNOSIS — I70213 Atherosclerosis of native arteries of extremities with intermittent claudication, bilateral legs: Secondary | ICD-10-CM | POA: Diagnosis not present

## 2020-04-26 DIAGNOSIS — R54 Age-related physical debility: Secondary | ICD-10-CM | POA: Diagnosis not present

## 2020-04-26 DIAGNOSIS — S81801D Unspecified open wound, right lower leg, subsequent encounter: Secondary | ICD-10-CM | POA: Diagnosis not present

## 2020-04-26 DIAGNOSIS — M6281 Muscle weakness (generalized): Secondary | ICD-10-CM | POA: Diagnosis not present

## 2020-04-29 DIAGNOSIS — Z139 Encounter for screening, unspecified: Secondary | ICD-10-CM | POA: Diagnosis not present

## 2020-04-29 DIAGNOSIS — J90 Pleural effusion, not elsewhere classified: Secondary | ICD-10-CM | POA: Diagnosis not present

## 2020-04-29 DIAGNOSIS — I509 Heart failure, unspecified: Secondary | ICD-10-CM | POA: Diagnosis not present

## 2020-04-29 DIAGNOSIS — N189 Chronic kidney disease, unspecified: Secondary | ICD-10-CM | POA: Diagnosis not present

## 2020-04-29 DIAGNOSIS — I517 Cardiomegaly: Secondary | ICD-10-CM | POA: Diagnosis not present

## 2020-04-30 DIAGNOSIS — I5032 Chronic diastolic (congestive) heart failure: Secondary | ICD-10-CM | POA: Diagnosis not present

## 2020-04-30 DIAGNOSIS — R0602 Shortness of breath: Secondary | ICD-10-CM | POA: Diagnosis not present

## 2020-04-30 DIAGNOSIS — F411 Generalized anxiety disorder: Secondary | ICD-10-CM | POA: Diagnosis not present

## 2020-04-30 DIAGNOSIS — R600 Localized edema: Secondary | ICD-10-CM | POA: Diagnosis not present

## 2020-04-30 DIAGNOSIS — R54 Age-related physical debility: Secondary | ICD-10-CM | POA: Diagnosis not present

## 2020-04-30 DIAGNOSIS — L299 Pruritus, unspecified: Secondary | ICD-10-CM | POA: Diagnosis not present

## 2020-04-30 DIAGNOSIS — R21 Rash and other nonspecific skin eruption: Secondary | ICD-10-CM | POA: Diagnosis not present

## 2020-04-30 DIAGNOSIS — R627 Adult failure to thrive: Secondary | ICD-10-CM | POA: Diagnosis not present

## 2020-05-01 DIAGNOSIS — R079 Chest pain, unspecified: Secondary | ICD-10-CM | POA: Diagnosis not present

## 2020-05-01 DIAGNOSIS — F411 Generalized anxiety disorder: Secondary | ICD-10-CM | POA: Diagnosis not present

## 2020-05-01 DIAGNOSIS — R600 Localized edema: Secondary | ICD-10-CM | POA: Diagnosis not present

## 2020-05-01 DIAGNOSIS — Z515 Encounter for palliative care: Secondary | ICD-10-CM | POA: Diagnosis not present

## 2020-05-01 DIAGNOSIS — R627 Adult failure to thrive: Secondary | ICD-10-CM | POA: Diagnosis not present

## 2020-05-01 DIAGNOSIS — I5032 Chronic diastolic (congestive) heart failure: Secondary | ICD-10-CM | POA: Diagnosis not present

## 2020-05-01 DIAGNOSIS — R54 Age-related physical debility: Secondary | ICD-10-CM | POA: Diagnosis not present

## 2020-05-01 DIAGNOSIS — R451 Restlessness and agitation: Secondary | ICD-10-CM | POA: Diagnosis not present

## 2020-05-01 DIAGNOSIS — I872 Venous insufficiency (chronic) (peripheral): Secondary | ICD-10-CM | POA: Diagnosis not present

## 2020-05-01 DIAGNOSIS — R0602 Shortness of breath: Secondary | ICD-10-CM | POA: Diagnosis not present

## 2020-05-02 DIAGNOSIS — R21 Rash and other nonspecific skin eruption: Secondary | ICD-10-CM | POA: Diagnosis not present

## 2020-05-02 DIAGNOSIS — R54 Age-related physical debility: Secondary | ICD-10-CM | POA: Diagnosis not present

## 2020-05-02 DIAGNOSIS — F411 Generalized anxiety disorder: Secondary | ICD-10-CM | POA: Diagnosis not present

## 2020-05-02 DIAGNOSIS — L299 Pruritus, unspecified: Secondary | ICD-10-CM | POA: Diagnosis not present

## 2020-05-02 DIAGNOSIS — I5032 Chronic diastolic (congestive) heart failure: Secondary | ICD-10-CM | POA: Diagnosis not present

## 2020-05-02 DIAGNOSIS — L97319 Non-pressure chronic ulcer of right ankle with unspecified severity: Secondary | ICD-10-CM | POA: Diagnosis not present

## 2020-05-02 DIAGNOSIS — R627 Adult failure to thrive: Secondary | ICD-10-CM | POA: Diagnosis not present

## 2020-05-03 DIAGNOSIS — R21 Rash and other nonspecific skin eruption: Secondary | ICD-10-CM | POA: Diagnosis not present

## 2020-05-03 DIAGNOSIS — L299 Pruritus, unspecified: Secondary | ICD-10-CM | POA: Diagnosis not present

## 2020-05-03 DIAGNOSIS — R54 Age-related physical debility: Secondary | ICD-10-CM | POA: Diagnosis not present

## 2020-05-03 DIAGNOSIS — Z515 Encounter for palliative care: Secondary | ICD-10-CM | POA: Diagnosis not present

## 2020-05-03 DIAGNOSIS — R627 Adult failure to thrive: Secondary | ICD-10-CM | POA: Diagnosis not present

## 2020-05-06 DIAGNOSIS — R21 Rash and other nonspecific skin eruption: Secondary | ICD-10-CM | POA: Diagnosis not present

## 2020-05-06 DIAGNOSIS — F411 Generalized anxiety disorder: Secondary | ICD-10-CM | POA: Diagnosis not present

## 2020-05-06 DIAGNOSIS — Z515 Encounter for palliative care: Secondary | ICD-10-CM | POA: Diagnosis not present

## 2020-05-06 DIAGNOSIS — R079 Chest pain, unspecified: Secondary | ICD-10-CM | POA: Diagnosis not present

## 2020-05-06 DIAGNOSIS — L299 Pruritus, unspecified: Secondary | ICD-10-CM | POA: Diagnosis not present

## 2020-05-06 DIAGNOSIS — R627 Adult failure to thrive: Secondary | ICD-10-CM | POA: Diagnosis not present

## 2020-05-06 DIAGNOSIS — R54 Age-related physical debility: Secondary | ICD-10-CM | POA: Diagnosis not present

## 2020-05-09 DIAGNOSIS — L97319 Non-pressure chronic ulcer of right ankle with unspecified severity: Secondary | ICD-10-CM | POA: Diagnosis not present

## 2020-05-10 DIAGNOSIS — F064 Anxiety disorder due to known physiological condition: Secondary | ICD-10-CM | POA: Diagnosis not present

## 2020-05-10 DIAGNOSIS — F0390 Unspecified dementia without behavioral disturbance: Secondary | ICD-10-CM | POA: Diagnosis not present

## 2020-05-28 DIAGNOSIS — R451 Restlessness and agitation: Secondary | ICD-10-CM | POA: Diagnosis not present

## 2020-05-28 DIAGNOSIS — F0390 Unspecified dementia without behavioral disturbance: Secondary | ICD-10-CM | POA: Diagnosis not present

## 2020-05-28 DIAGNOSIS — F411 Generalized anxiety disorder: Secondary | ICD-10-CM | POA: Diagnosis not present

## 2020-06-04 DIAGNOSIS — M6281 Muscle weakness (generalized): Secondary | ICD-10-CM | POA: Diagnosis not present

## 2020-06-04 DIAGNOSIS — W19XXXA Unspecified fall, initial encounter: Secondary | ICD-10-CM | POA: Diagnosis not present

## 2020-06-05 DIAGNOSIS — I509 Heart failure, unspecified: Secondary | ICD-10-CM | POA: Diagnosis not present

## 2020-06-05 DIAGNOSIS — J90 Pleural effusion, not elsewhere classified: Secondary | ICD-10-CM | POA: Diagnosis not present

## 2020-06-05 DIAGNOSIS — I517 Cardiomegaly: Secondary | ICD-10-CM | POA: Diagnosis not present

## 2020-07-06 DEATH — deceased

## 2021-06-29 IMAGING — DX DG CHEST 2V
2 series · 2 of 2 positions shown · non-contrast
Comparison: Chest x-rays dated 09/20/2019 and 02/25/2018.

CLINICAL DATA: Transient delirium, agitation. Rule out infection.

EXAM:
CHEST - 2 VIEW

[chest lat]
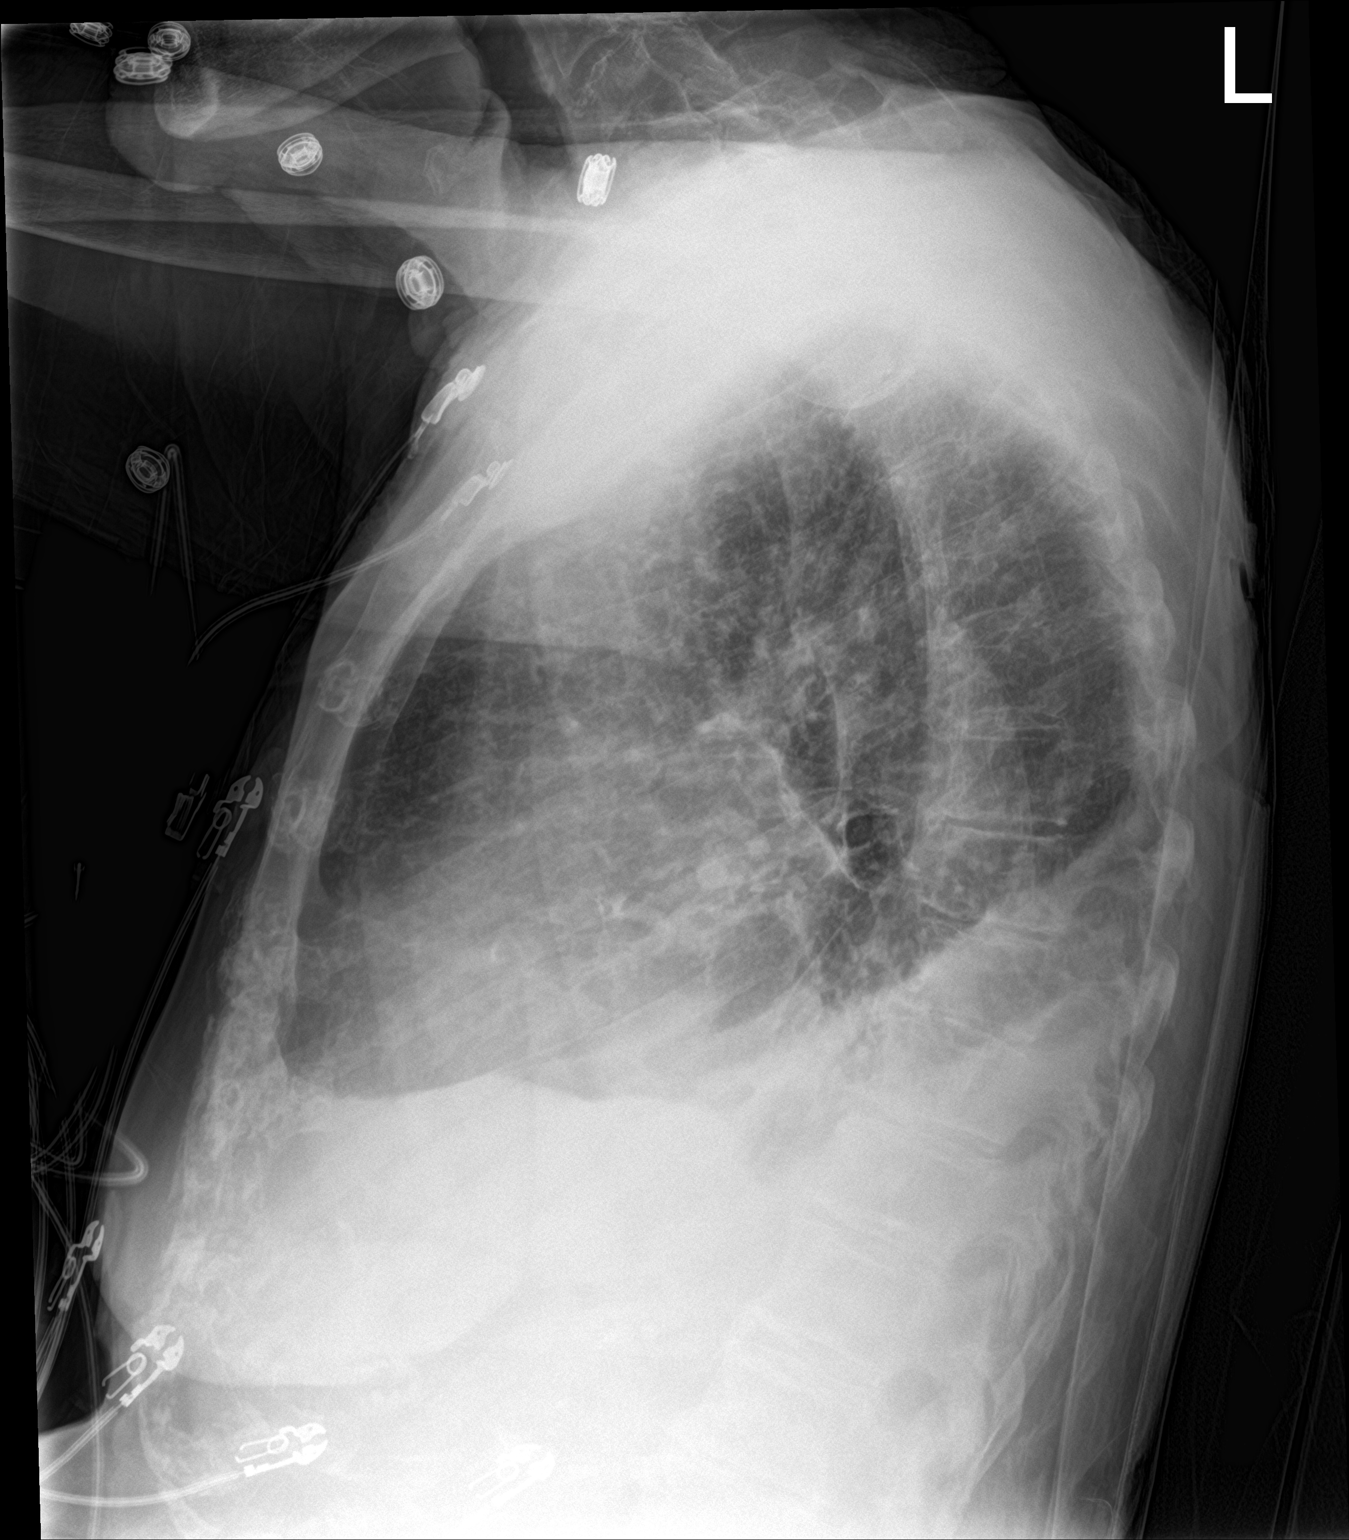

[chest ap]
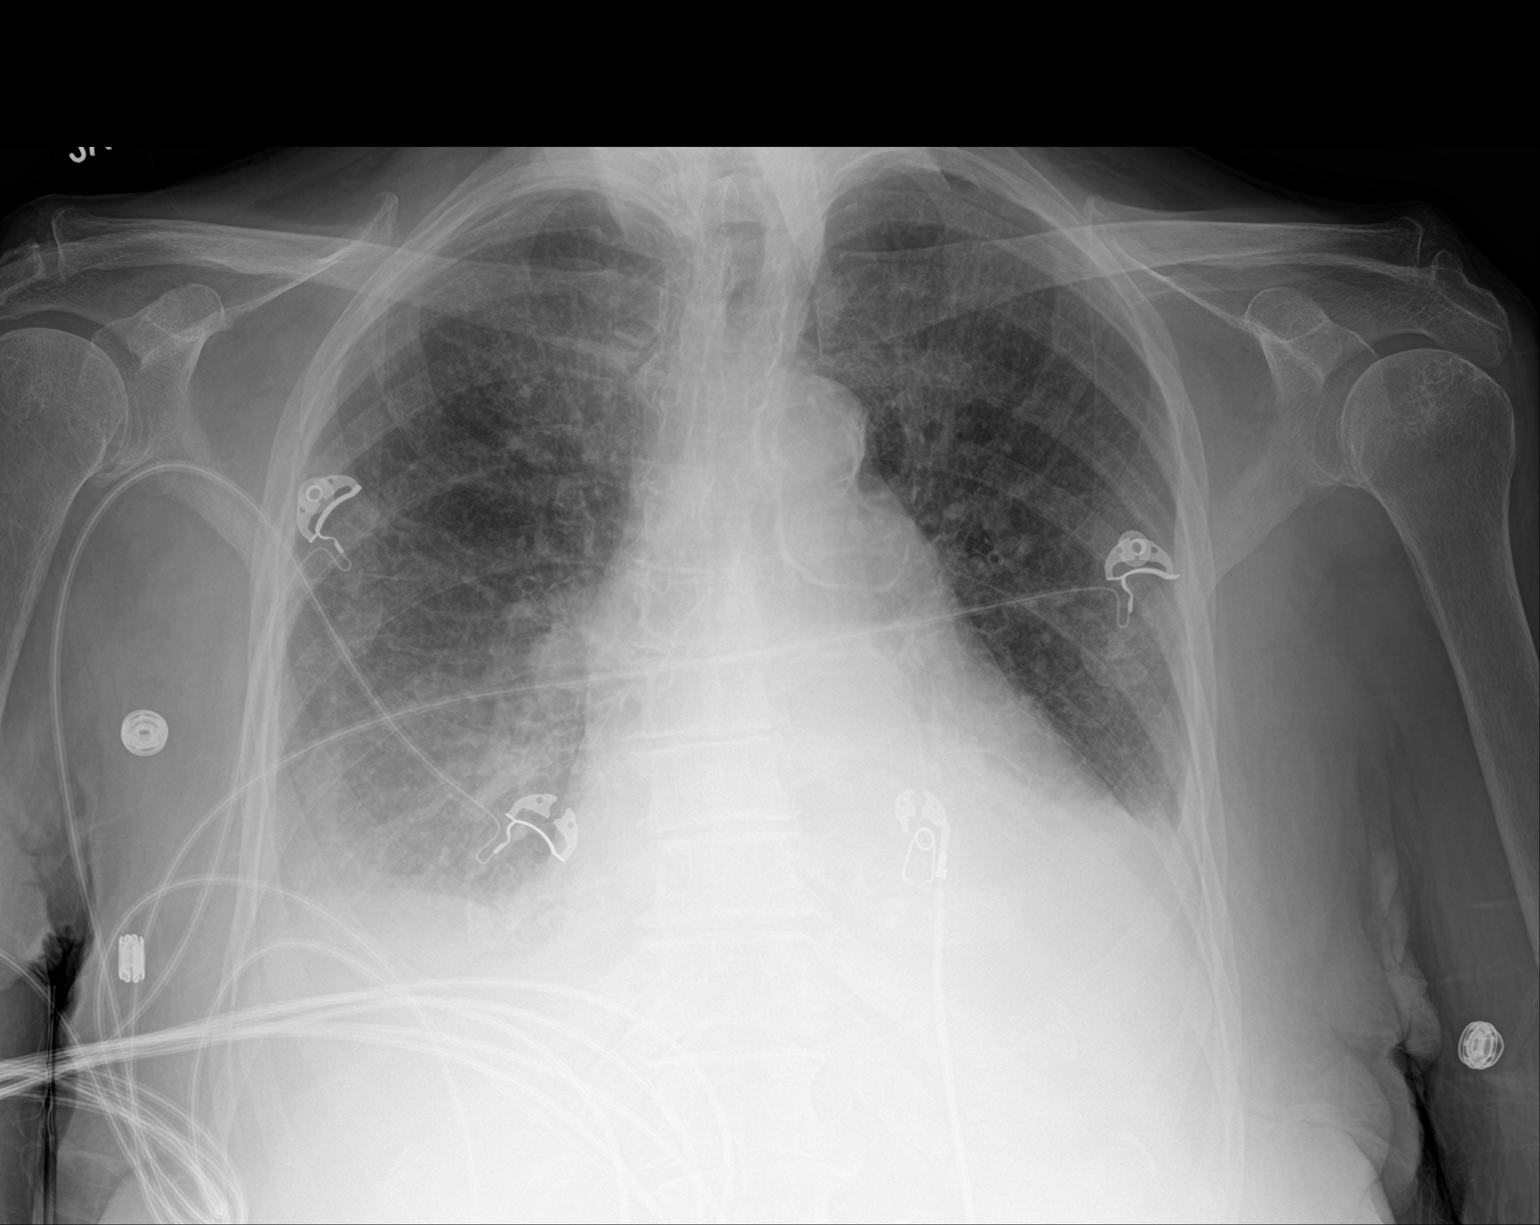

[2 of 2 positions shown; findings below may reference images not displayed]

FINDINGS: Stable hazy opacity at the RIGHT lung base. Stable denser opacity at
the LEFT lung base. Coarse interstitial markings are again seen
throughout both lungs suggesting chronic interstitial lung disease.
Stable mild cardiomegaly. No acute appearing osseous abnormality.
IMPRESSION: 1. No significant change compared to most recent chest x-ray of
09/20/2019. Stable bibasilar opacities, at least some component due
to small bilateral pleural effusions. Superimposed atelectasis is
likely.
2. Stable mild cardiomegaly.
3. Probable chronic interstitial lung disease.
